# Patient Record
Sex: Male | Born: 1995 | Hispanic: No | Marital: Single | Smoking: Never smoker
Health system: Southern US, Community
[De-identification: ages and names within clinical notes are randomized; demographics above are authoritative.]

---

## 2018-03-07 ENCOUNTER — Emergency Department (HOSPITAL_COMMUNITY): Payer: BLUE CROSS/BLUE SHIELD

## 2018-03-07 ENCOUNTER — Inpatient Hospital Stay (HOSPITAL_COMMUNITY)
Admission: EM | Admit: 2018-03-07 | Discharge: 2018-03-19 | DRG: 840 | Disposition: A | Discharge status: Home / Self Care | Payer: BLUE CROSS/BLUE SHIELD | Attending: Hematology | Admitting: Hematology

## 2018-03-07 ENCOUNTER — Other Ambulatory Visit: Payer: Self-pay

## 2018-03-07 ENCOUNTER — Encounter (HOSPITAL_COMMUNITY): Payer: Self-pay | Admitting: Emergency Medicine

## 2018-03-07 DIAGNOSIS — I871 Compression of vein: Secondary | ICD-10-CM | POA: Diagnosis present

## 2018-03-07 DIAGNOSIS — I82611 Acute embolism and thrombosis of superficial veins of right upper extremity: Secondary | ICD-10-CM | POA: Diagnosis present

## 2018-03-07 DIAGNOSIS — J948 Other specified pleural conditions: Secondary | ICD-10-CM | POA: Diagnosis present

## 2018-03-07 DIAGNOSIS — R0600 Dyspnea, unspecified: Secondary | ICD-10-CM | POA: Diagnosis not present

## 2018-03-07 DIAGNOSIS — J189 Pneumonia, unspecified organism: Secondary | ICD-10-CM | POA: Diagnosis present

## 2018-03-07 DIAGNOSIS — J45909 Unspecified asthma, uncomplicated: Secondary | ICD-10-CM | POA: Diagnosis present

## 2018-03-07 DIAGNOSIS — I82622 Acute embolism and thrombosis of deep veins of left upper extremity: Secondary | ICD-10-CM | POA: Diagnosis not present

## 2018-03-07 DIAGNOSIS — D62 Acute posthemorrhagic anemia: Secondary | ICD-10-CM | POA: Diagnosis not present

## 2018-03-07 DIAGNOSIS — Z7189 Other specified counseling: Secondary | ICD-10-CM

## 2018-03-07 DIAGNOSIS — I2693 Single subsegmental pulmonary embolism without acute cor pulmonale: Secondary | ICD-10-CM | POA: Diagnosis not present

## 2018-03-07 DIAGNOSIS — I454 Nonspecific intraventricular block: Secondary | ICD-10-CM | POA: Diagnosis present

## 2018-03-07 DIAGNOSIS — K5900 Constipation, unspecified: Secondary | ICD-10-CM | POA: Diagnosis not present

## 2018-03-07 DIAGNOSIS — Z7951 Long term (current) use of inhaled steroids: Secondary | ICD-10-CM | POA: Diagnosis not present

## 2018-03-07 DIAGNOSIS — C8528 Mediastinal (thymic) large B-cell lymphoma, lymph nodes of multiple sites: Secondary | ICD-10-CM

## 2018-03-07 DIAGNOSIS — C8592 Non-Hodgkin lymphoma, unspecified, intrathoracic lymph nodes: Secondary | ICD-10-CM | POA: Diagnosis not present

## 2018-03-07 DIAGNOSIS — J910 Malignant pleural effusion: Secondary | ICD-10-CM

## 2018-03-07 DIAGNOSIS — J939 Pneumothorax, unspecified: Secondary | ICD-10-CM

## 2018-03-07 DIAGNOSIS — J90 Pleural effusion, not elsewhere classified: Secondary | ICD-10-CM | POA: Diagnosis not present

## 2018-03-07 DIAGNOSIS — I82C12 Acute embolism and thrombosis of left internal jugular vein: Secondary | ICD-10-CM | POA: Diagnosis present

## 2018-03-07 DIAGNOSIS — Z5111 Encounter for antineoplastic chemotherapy: Secondary | ICD-10-CM

## 2018-03-07 DIAGNOSIS — J984 Other disorders of lung: Secondary | ICD-10-CM | POA: Diagnosis not present

## 2018-03-07 DIAGNOSIS — R740 Nonspecific elevation of levels of transaminase and lactic acid dehydrogenase [LDH]: Secondary | ICD-10-CM | POA: Diagnosis present

## 2018-03-07 DIAGNOSIS — T380X5A Adverse effect of glucocorticoids and synthetic analogues, initial encounter: Secondary | ICD-10-CM | POA: Diagnosis present

## 2018-03-07 DIAGNOSIS — R739 Hyperglycemia, unspecified: Secondary | ICD-10-CM | POA: Diagnosis present

## 2018-03-07 DIAGNOSIS — J9859 Other diseases of mediastinum, not elsewhere classified: Secondary | ICD-10-CM | POA: Diagnosis not present

## 2018-03-07 DIAGNOSIS — I82B12 Acute embolism and thrombosis of left subclavian vein: Secondary | ICD-10-CM | POA: Diagnosis present

## 2018-03-07 DIAGNOSIS — R600 Localized edema: Secondary | ICD-10-CM | POA: Diagnosis not present

## 2018-03-07 DIAGNOSIS — J9811 Atelectasis: Secondary | ICD-10-CM | POA: Diagnosis present

## 2018-03-07 DIAGNOSIS — I361 Nonrheumatic tricuspid (valve) insufficiency: Secondary | ICD-10-CM | POA: Diagnosis not present

## 2018-03-07 DIAGNOSIS — I340 Nonrheumatic mitral (valve) insufficiency: Secondary | ICD-10-CM | POA: Diagnosis not present

## 2018-03-07 DIAGNOSIS — C8522 Mediastinal (thymic) large B-cell lymphoma, intrathoracic lymph nodes: Secondary | ICD-10-CM

## 2018-03-07 DIAGNOSIS — R0602 Shortness of breath: Secondary | ICD-10-CM | POA: Diagnosis present

## 2018-03-07 DIAGNOSIS — E440 Moderate protein-calorie malnutrition: Secondary | ICD-10-CM

## 2018-03-07 DIAGNOSIS — Z9889 Other specified postprocedural states: Secondary | ICD-10-CM

## 2018-03-07 DIAGNOSIS — Z681 Body mass index (BMI) 19 or less, adult: Secondary | ICD-10-CM | POA: Diagnosis not present

## 2018-03-07 DIAGNOSIS — C8590 Non-Hodgkin lymphoma, unspecified, unspecified site: Secondary | ICD-10-CM | POA: Diagnosis not present

## 2018-03-07 DIAGNOSIS — Z79899 Other long term (current) drug therapy: Secondary | ICD-10-CM | POA: Diagnosis not present

## 2018-03-07 DIAGNOSIS — R918 Other nonspecific abnormal finding of lung field: Secondary | ICD-10-CM | POA: Diagnosis not present

## 2018-03-07 LAB — MRSA PCR SCREENING: MRSA by PCR: NEGATIVE

## 2018-03-07 LAB — HEPATIC FUNCTION PANEL
ALBUMIN: 2.9 g/dL — AB (ref 3.5–5.0)
ALT: 24 U/L (ref 0–44)
AST: 32 U/L (ref 15–41)
Alkaline Phosphatase: 54 U/L (ref 38–126)
Bilirubin, Direct: 0.1 mg/dL (ref 0.0–0.2)
Indirect Bilirubin: 0.2 mg/dL — ABNORMAL LOW (ref 0.3–0.9)
TOTAL PROTEIN: 6.7 g/dL (ref 6.5–8.1)
Total Bilirubin: 0.3 mg/dL (ref 0.3–1.2)

## 2018-03-07 LAB — BASIC METABOLIC PANEL
Anion gap: 10 (ref 5–15)
BUN: 7 mg/dL (ref 6–20)
CO2: 27 mmol/L (ref 22–32)
Calcium: 8.7 mg/dL — ABNORMAL LOW (ref 8.9–10.3)
Chloride: 98 mmol/L (ref 98–111)
Creatinine, Ser: 0.96 mg/dL (ref 0.61–1.24)
GFR calc Af Amer: >60 mL/min (ref >60)
GFR calc non Af Amer: >60 mL/min (ref >60)
Glucose, Bld: 87 mg/dL (ref 70–99)
Potassium: 4.2 mmol/L (ref 3.5–5.1)
Sodium: 135 mmol/L (ref 135–145)

## 2018-03-07 LAB — CBC WITH DIFFERENTIAL/PLATELET
Abs Immature Granulocytes: 0.02 10*3/uL (ref 0.00–0.07)
Basophils Absolute: 0.1 10*3/uL (ref 0.0–0.1)
Basophils Relative: 1 %
Eosinophils Absolute: 0.3 10*3/uL (ref 0.0–0.5)
Eosinophils Relative: 5 %
HCT: 45.6 % (ref 39.0–52.0)
Hemoglobin: 14.1 g/dL (ref 13.0–17.0)
Immature Granulocytes: 0 %
Lymphocytes Relative: 14 %
Lymphs Abs: 1 10*3/uL (ref 0.7–4.0)
MCH: 25.4 pg — ABNORMAL LOW (ref 26.0–34.0)
MCHC: 30.9 g/dL (ref 30.0–36.0)
MCV: 82.2 fL (ref 80.0–100.0)
Monocytes Absolute: 0.6 10*3/uL (ref 0.1–1.0)
Monocytes Relative: 9 %
Neutro Abs: 5.3 10*3/uL (ref 1.7–7.7)
Neutrophils Relative %: 71 %
Platelets: 319 10*3/uL (ref 150–400)
RBC: 5.55 MIL/uL (ref 4.22–5.81)
RDW: 12.6 % (ref 11.5–15.5)
WBC: 7.3 10*3/uL (ref 4.0–10.5)
nRBC: 0 % (ref 0.0–0.2)

## 2018-03-07 LAB — ABO/RH: ABO/RH(D): A POS

## 2018-03-07 LAB — BRAIN NATRIURETIC PEPTIDE: B Natriuretic Peptide: 33.2 pg/mL (ref 0.0–100.0)

## 2018-03-07 LAB — URIC ACID: Uric Acid, Serum: 5.2 mg/dL (ref 3.7–8.6)

## 2018-03-07 LAB — LACTATE DEHYDROGENASE: LDH: 677 U/L — ABNORMAL HIGH (ref 98–192)

## 2018-03-07 LAB — D-DIMER, QUANTITATIVE: D-Dimer, Quant: 2.53 ug/mL-FEU — ABNORMAL HIGH (ref 0.00–0.50)

## 2018-03-07 LAB — TROPONIN I: Troponin I: <0.03 ng/mL (ref <0.03)

## 2018-03-07 LAB — GLUCOSE, CAPILLARY: GLUCOSE-CAPILLARY: 90 mg/dL (ref 70–99)

## 2018-03-07 MED ORDER — IOPAMIDOL (ISOVUE-370) INJECTION 76%
100.0000 mL | Freq: Once | INTRAVENOUS | Status: AC | PRN
  Administered 2018-03-07: 100 mL via INTRAVENOUS

## 2018-03-07 MED ORDER — HEPARIN (PORCINE) 25000 UT/250ML-% IV SOLN
1000.0000 [IU]/h | INTRAVENOUS | Status: DC
  Administered 2018-03-07: 1000 [IU]/h via INTRAVENOUS
  Filled 2018-03-07: qty 250

## 2018-03-07 MED ORDER — IOPAMIDOL (ISOVUE-370) INJECTION 76%
INTRAVENOUS | Status: AC
  Filled 2018-03-07: qty 100

## 2018-03-07 MED ORDER — HEPARIN BOLUS VIA INFUSION
4000.0000 [IU] | Freq: Once | INTRAVENOUS | Status: AC
  Administered 2018-03-07: 4000 [IU] via INTRAVENOUS
  Filled 2018-03-07: qty 4000

## 2018-03-07 NOTE — ED Provider Notes (Signed)
[facility] EMERGENCY DEPARTMENT Provider Note   CSN: [id number] Arrival date & time: 03/07/18  1438   History   Chief Complaint Chief Complaint  Patient presents with  . Shortness of Breath    HPI Nicholas Moreno is a 22 y.o. male.  HPI   22 year old male presents today with complaints of shortness of breath.  Patient notes approximately 3 months ago he had an upper respiratory infection which caused shortness of breath, rhinorrhea, nasal congestion and cough.  Patient notes that the symptoms improved but he continues to have cough and shortness of breath.  He notes this is worse with ambulation, worse with laying down.  He denies any associated chest pain or fever.  He denies any history DVT or lower extremity swelling or edema.  Patient reports that he does not smoke does not vape.  He reports recent travel in October to [place], and travel to the [place] in August.  Patient is currently a Ship broker at [employer] and has been seen 3 times by their health department with no acute findings although he reports that he did not do any imaging or testing.  He denies any significant past medical history including asthma.  He was recently placed on prednisone noting he took a dose today, he was also placed on albuterol but has not used it since 4 AM this morning.      History reviewed. No pertinent past medical history.  There are no active problems to display for this patient.         Home Medications    Prior to Admission medications   Not on File    Family History No family history on file.  Social History Social History   Tobacco Use  . Smoking status: Not on file  Substance Use Topics  . Alcohol use: Not on file  . Drug use: Not on file     Allergies   Patient has no known allergies.   Review of Systems Review of Systems  All other systems reviewed and are negative.  Physical Exam Updated Vital Signs BP 101/66   Pulse 100   Temp 99 F  (37.2 C) (Oral)   Resp 16   Ht 5\' 6"  (1.676 m)   Wt 54.4 kg   SpO2 99%   BMI 19.37 kg/m   Physical Exam  Constitutional: He is oriented to person, place, and time. He appears well-developed and well-nourished.  HENT:  Head: Normocephalic and atraumatic.  Oropharynx clear no swelling or edema, bilateral TMs normal-hoarse voice  Eyes: Pupils are equal, round, and reactive to light. Conjunctivae are normal. Right eye exhibits no discharge. Left eye exhibits no discharge. No scleral icterus.  Neck: Normal range of motion. No JVD present. No tracheal deviation present.  Cardiovascular: Regular rhythm, normal heart sounds and intact distal pulses. Exam reveals no gallop and no friction rub.  No murmur heard. Minor right-sided JVD  Pulmonary/Chest: Effort normal and breath sounds normal. No stridor. No respiratory distress. He has no wheezes. He has no rales. He exhibits no tenderness.  Musculoskeletal:  No lower extremity swelling or edema  Neurological: He is alert and oriented to person, place, and time. Coordination normal.  Psychiatric: He has a normal mood and affect. His behavior is normal. Judgment and thought content normal.  Nursing note and vitals reviewed.   ED Treatments / Results  Labs (all labs ordered are listed, but only abnormal results are displayed) Labs Reviewed  CBC WITH DIFFERENTIAL/PLATELET - Abnormal; Notable  for the following components:      Result Value   MCH 25.4 (*)    All other components within normal limits  BASIC METABOLIC PANEL - Abnormal; Notable for the following components:   Calcium 8.7 (*)    All other components within normal limits  D-DIMER, QUANTITATIVE (NOT AT [facility]) - Abnormal; Notable for the following components:   D-Dimer, Quant 2.53 (*)    All other components within normal limits  BRAIN NATRIURETIC PEPTIDE  TROPONIN I    EKG EKG Interpretation  Date/Time:  Friday March 07 2018 15:05:18 EST Ventricular Rate:  106 PR  Interval:  128 QRS Duration: 84 QT Interval:  322 QTC Calculation: 427 R Axis:   80 Text Interpretation:  Sinus tachycardia Cannot rule out Anterior infarct , age undetermined Abnormal ECG No old tracing to compare Confirmed by Merrily Pew [phone]) on 03/07/2018 4:12:37 PM   Radiology Ct Angio Chest Pe W And/or Wo Contrast  Result Date: 03/07/2018 CLINICAL DATA:  Shortness of breath and cough. EXAM: CT ANGIOGRAPHY CHEST WITH CONTRAST TECHNIQUE: Multidetector CT imaging of the chest was performed using the standard protocol during bolus administration of intravenous contrast. Multiplanar CT image reconstructions and MIPs were obtained to evaluate the vascular anatomy. CONTRAST:  141mL ISOVUE-370 IOPAMIDOL (ISOVUE-370) INJECTION 76% COMPARISON:  None. FINDINGS: Cardiovascular: Normal heart size. The main pulmonary artery appears patent. Filling defect within the left upper lobe lobar pulmonary artery, image 104 through image 101 compatible with acute pulmonary emboli. Mediastinum/Nodes: A very large anterior mediastinal mass is identified which extends into both upper lung zones. This mass encases and narrows the superior vena cava as well as the branch vessels off the aortic arch. There is extensive chest wall collaterals especially in the left supraclavicular region and paraspinal region. Encasement and narrowing of the left main pulmonary artery and bilateral upper lobe pulmonary arteries. Encasement and narrowing of the trachea is identified with rightward and posterior displacement. The left mainstem bronchus is narrowed, and bilateral upper lobe bronchi are occluded. Narrowing of the left lower lobe bronchi. Lungs/Pleura: There is a moderate left pleural effusion. Approximately 90% opacification of the left upper lobe is identified secondary to tumor and postobstructive consolidation. A small amount of aerated lung within the apex. Compressive type atelectasis is noted within the left lung base.  There is approximately 50% opacification of the right upper lobe with patchy areas of airspace consolidation and ground-glass attenuation within the remaining portions of the right upper lobe. The mass within the right upper lobe contains central areas of cavitation compatible with necrosis. Upper Abdomen: No acute findings. Musculoskeletal: No chest wall abnormality. No acute or significant osseous findings. Review of the MIP images confirms the above findings. IMPRESSION: 1. Small acute pulmonary emboli identified within the distal left upper lobe lobar pulmonary artery. 2. 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 Primary differential considerations include lymphoma and leukemia as well as malignant derm cell tumors. 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 Narrowing and displacement of the trachea with complete occlusion of the upper lobe bronchi. There is also marked narrowing of bilateral upper lobe pulmonary arteries. 3. Significantly diminished aeration to both upper lobes, left greater than right. 4. Moderate left pleural effusion. Critical Value/emergent results were called by telephone at the time of interpretation on 03/07/2018 at 5:29 pm to Dr. Vonna Kotyk  GEIPLE , who verbally acknowledged these results. Electronically Signed   By: Kerby Moors M.D.   On: 03/07/2018 17:30    Procedures Procedures (including critical care time)  Medications Ordered in ED Medications  iopamidol (ISOVUE-370) 76 % injection (has no administration in time range)  iopamidol (ISOVUE-370) 76 % injection (has no administration in time range)  iopamidol (ISOVUE-370) 76 % injection 100 mL (100 mLs Intravenous Contrast Given 03/07/18 1646)     Initial Impression / Assessment and Plan / ED Course  I have reviewed the  triage vital signs and the nursing notes.  Pertinent labs & imaging results that were available during my care of the patient were reviewed by me and considered in my medical decision making (see chart for details).      Labs: CBC, BMP, d-dimer  Imaging: DG chest 2 view  Consults:  Therapeutics:  Discharge Meds:   Assessment/Plan: 22 year old male presents today with complaints of shortness of breath.  Uncertain etiology at this time.  Although he does have a hoarse voice question infectious etiology.  Patient is persistently tachycardic here so the concern for PE is raised.  He will need evaluation including EKG labs including d-dimer, chest x-ray and ongoing management.  Patient care signed to oncoming provider pending further evaluation management.   Final Clinical Impressions(s) / ED Diagnoses   Final diagnoses:  SOB (shortness of breath)    ED Discharge Orders    None       Okey Regal, PA-C 03/07/18 1800    Mesner, Corene Cornea, MD 03/07/18 [phone]

## 2018-03-07 NOTE — ED Notes (Signed)
ED Provider at bedside. 

## 2018-03-07 NOTE — H&P (Signed)
NAME:  Nicholas Moreno, MRN:  [id number], DOB:  1995/11/07, LOS: 0 ADMISSION DATE:  03/07/2018, CONSULTATION DATE:  03/07/18 REFERRING MD:  Carlisle Cater, PA-C CHIEF COMPLAINT:  Shortness of breath   Brief History   Admitted for dyspnea in setting of large mediastinal mass and PE. Stable on room air and hemodynamically stable.  History of present illness   Nicholas Moreno is 22 y.o. M with no significant PMH who presented to the ED for worsening dyspnea. Patient reports his symptoms began approximately 2 months ago with increasing dyspnea at rest. Approximately one month ago he was seen by [facility] at [facility] who felt that he may have asthma based on his physical exam and prescribed Flovent and Albuterol which helped briefly, however, his symptoms began to worsen again approximately one week ago and became severe enough today for him to present to the ED. He initially had subjective fevers when his symptoms began 2 months ago, but none since. He denies any sputum production, LE edema, or hemoptysis. He states his symptoms improve with ambulation and when lying on his left side but worsen with lying flat. He has also had decreased appetite since his symptoms began and has lost 18 lbs unintentionally over the last 2 months.   On presentation to the ED today D-dimer was mildly elevated and thus CTA PE protocol was obtained which showed a large, necrotic mediastinal mass with adjacent atelectasis, left pleural effusion and small RUL PE.  He also reports recent travel to the [place] over the recent summer and to [place] in October, but denies any known sick contacts.   Past Medical History  None  Significant Hospital Events   None  Consults:  Thoracic Surgery Medical Oncology  Procedures:  None  Significant Diagnostic Tests:  CTA Chest: IMPRESSION: 1. Small acute pulmonary emboli identified within the distal left  upper lobe lobar pulmonary artery. 2. Very large partially cavitary  anterior mediastinal mass is identified which encases the great vessels and its branches. Primary differential considerations include lymphoma and leukemia as well as malignant derm cell tumors. 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 Narrowing and displacement of the trachea with complete occlusion of the upper lobe bronchi. There is also marked narrowing of bilateral upper lobe pulmonary arteries. 3. Significantly diminished aeration to both upper lobes, left greater than right. 4. Moderate left pleural effusion.  Micro Data:  n/a  Antimicrobials:  n/a   Interim history/subjective:  Patient reports mild dyspnea at rest but is stable on RA. Denies any chest pain or hemoptysis. Thoracic Surgery consulted by ED, but have not seen patient yet.  Objective   Blood pressure 101/66, pulse 100, temperature 99 F (37.2 C), temperature source Oral, resp. rate 16, height 5\' 6"  (1.676 m), weight 54.4 kg, SpO2 99 %.       No intake or output data in the 24 hours ending 03/07/18 1937 Filed Weights   03/07/18 1445  Weight: 54.4 kg    Examination: General: Mild tachypnea but no distress, appears stated age HENT: EOMI, sclera clear, MMM, nares patent Lungs: diminished on left. Improved aeration on Right. Mild tachypnea with RR in 20s. Cardiovascular: Tachycardic Abdomen: Soft, NTND, no masses apprecated. Small area of bruising on R Hip. Extremities: No swelling, warm to touch Neuro: A&Ox3, no focal deficits GU: deferred but no masses per ED exam  Resolved Hospital Problem list   n/a  Assessment & Plan:   Mediastinal mass: With adjacent atelectasis and left pleural effusion resulting in dyspnea at rest.  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  Germ cell tumor is also a consideration although less likely.  No current respiratory distress but mild dyspnea on  exam. --Medical oncology consulted, appreciate assistance.  Will check LDH, uric acid, beta hCG, and alpha-fetoprotein per their recommendations. --Thoracic surgery consulted by emergency department for consideration of biopsy.  Potentially could have CT-guided core needle biopsy as opposed to surgical biopsy which may limit his risk.  Will discuss with thoracic surgery consultants. --Consider thoracentesis in AM --N.p.o. at midnight for possible biopsy.   Pulmonary embolism: --Start heparin infusion --Hold at 4am for possible procedure  Best practice:  Diet: Regular diet, NPO at MN Pain/Anxiety/Delirium protocol (if indicated): n/a VAP protocol (if indicated): n/a DVT prophylaxis: Hep gtt GI prophylaxis: n/a Glucose control: n/a Mobility: as tolerated Code Status: Full Family Communication: discussed at bedside on admission with mother Disposition: ICU  Labs   CBC: Recent Labs  Lab 03/07/18 1535  WBC 7.3  NEUTROABS 5.3  HGB 14.1  HCT 45.6  MCV 82.2  PLT 250    Basic Metabolic Panel: Recent Labs  Lab 03/07/18 1535  NA 135  K 4.2  CL 98  CO2 27  GLUCOSE 87  BUN 7  CREATININE 0.96  CALCIUM 8.7*   GFR: Estimated Creatinine Clearance: 93.7 mL/min (by C-G formula based on SCr of 0.96 mg/dL). Recent Labs  Lab 03/07/18 1535  WBC 7.3    Liver Function Tests: No results for input(s): AST, ALT, ALKPHOS, BILITOT, PROT, ALBUMIN in the last 168 hours. No results for input(s): LIPASE, AMYLASE in the last 168 hours. No results for input(s): AMMONIA in the last 168 hours.  ABG No results found for: PHART, PCO2ART, PO2ART, HCO3, TCO2, ACIDBASEDEF, O2SAT   Coagulation Profile: No results for input(s): INR, PROTIME in the last 168 hours.  Cardiac Enzymes: Recent Labs  Lab 03/07/18 1540  TROPONINI <0.03    HbA1C: No results found for: HGBA1C  CBG: No results for input(s): GLUCAP in the last 168 hours.  Review of Systems:   Negative on 12 system review  except per HPI  Past Medical History  He,  has no past medical history on file.   Surgical History   History reviewed. No pertinent surgical history.   Social History   reports that he has never smoked. He has never used smokeless tobacco. He reports that he drank alcohol. He reports that he has current or past drug history.   Family History   His family history is not on file.   Allergies No Known Allergies   Home Medications  Prior to Admission medications   Medication Sig Start Date End Date Taking? Authorizing Provider  albuterol (PROVENTIL HFA;VENTOLIN HFA) 108 (90 Base) MCG/ACT inhaler Inhale 2 puffs into the lungs every 4 (four) hours as needed for wheezing or shortness of breath.   Yes [provider]  fluticasone (FLOVENT HFA) 110 MCG/ACT inhaler Inhale 2 puffs into the lungs 2 (two) times daily.   Yes [provider]  OVER THE COUNTER MEDICATION Take 1 capsule by mouth 2 (two) times daily. "Immpower" immune supplement   Yes [provider]  vitamin C (ASCORBIC ACID) 500 MG tablet Take 500 mg by mouth daily.   Yes [provider]         Marlise Eves, MD [facility] PCCM

## 2018-03-07 NOTE — Progress Notes (Signed)
[place] for heparin Indication: pulmonary embolus  Heparin Dosing Weight: 54.4 kg  Labs: Recent Labs    03/07/18 1535 03/07/18 1540  HGB 14.1  --   HCT 45.6  --   PLT 319  --   CREATININE 0.96  --   TROPONINI  --  <0.03    Assessment: 21 yom with new small PE. Pharmacy consulted to heparin. Not on anticoagulation PTA. CBC wnl. No bleed documented.  Goal of Therapy:  Heparin level 0.3-0.7 units/ml Monitor platelets by anticoagulation protocol: Yes   Plan:  Heparin 4000 unit bolus Start heparin at 1000 units/h 6h heparin level Daily heparin level/CBC Monitor s/sx bleeding  Elicia Lamp, PharmD, BCPS Clinical Pharmacist 03/07/2018 7:16 PM

## 2018-03-07 NOTE — ED Triage Notes (Signed)
Patient to ED c/o shortness of breath and weak cough x 2 months. States it started as an upper respiratory illness but he hasn't improved. Patient's mother reports patient has had fevers off and on. He adds that his voice is more hoarse than normal as well. Resp e/u.

## 2018-03-07 NOTE — ED Notes (Signed)
Lab called for add ons.

## 2018-03-07 NOTE — Consult Note (Signed)
[address]       [name] [address]             [phone]        Nicholas Moreno [facility] Medical Record #[id number] Date of Birth: [birth year]  Referring: No ref. provider found Primary Care: Nicholas Moreno, No Pcp Per Primary Cardiologist:No primary care provider on file.  Chief Complaint:    Chief Complaint  Nicholas Moreno presents with  . Shortness of Breath    History of Present Illness:      Nicholas Moreno examined, images of CT scan personally reviewed and discussed with Nicholas Moreno and parents 70 year old college senior presents with cough congestion shortness of breath.  For the last month the Nicholas Moreno has had poor appetite, night sweats, intermittent fever and weight loss.  CT scan of the chest shows a large infiltrative mediastinal mass extending across the thorax involving both lungs with consolidation of the left lung and involvement of the mediastinal vessels including compression of the SVC and pulmonary arteries and a pulmonary embolus in  a left upper lobe pulmonary artery branch.  Nicholas Moreno is currently in medical ICU on heparin drip normal saturations slightly tachycardic.  No evidence of pericardial effusion on CT scan  Current Activity/ Functional Status: Nicholas Moreno is attending college at [employer] Score: At the time of surgery this Nicholas Moreno's most appropriate activity status/level should be described as: []     0    Normal activity, no symptoms [x]     1    Restricted in physical strenuous activity but ambulatory, able to do out light work []     2    Ambulatory and capable of self care, unable to do work activities, up and about                 more than 50%  Of the time                            []     3    Only limited self care, in bed greater than 50% of waking hours []     4    Completely disabled, no self care, confined to bed or chair []     5    Moribund  History reviewed. No pertinent past medical history.  History reviewed. No pertinent surgical  history.  Social History   Tobacco Use  Smoking Status Never Smoker  Smokeless Tobacco Never Used    Social History   Substance and Sexual Activity  Alcohol Use Not Currently     No Known Allergies  Current Facility-Administered Medications  Medication Dose Route Frequency Provider Last Rate Last Dose  . heparin ADULT infusion 100 units/mL (25000 units/269mL sodium chloride 0.45%)  1,000 Units/hr Intravenous Continuous Romona Curls, RPH 10 mL/hr at 03/07/18 2005 1,000 Units/hr at 03/07/18 2005  . iopamidol (ISOVUE-370) 76 % injection           . iopamidol (ISOVUE-370) 76 % injection             Medications Prior to Admission  Medication Sig Dispense Refill Last Dose  . albuterol (PROVENTIL HFA;VENTOLIN HFA) 108 (90 Base) MCG/ACT inhaler Inhale 2 puffs into the lungs every 4 (four) hours as needed for wheezing or shortness of breath.   03/07/2018 at 400  . fluticasone (FLOVENT HFA) 110 MCG/ACT inhaler Inhale 2 puffs into the lungs 2 (two) times daily.   03/07/2018 at 1000  .  OVER THE COUNTER MEDICATION Take 1 capsule by mouth 2 (two) times daily. "Immpower" immune supplement   03/06/2018 at Unknown time  . vitamin C (ASCORBIC ACID) 500 MG tablet Take 500 mg by mouth daily.   week ago    History reviewed. No pertinent family history.   Review of Systems:   ROS      Cardiac Review of Systems: Y or  [    ]= no  Chest Pain [  y yes discomfort]  Resting SOB [   ] Exertional SOB  [ y ]  Orthopnea [  ]   Pedal Edema [   ]    Palpitations [  ] Syncope  [  ]   Presyncope [   ]  General Review of Systems: [Y] = yes [  ]=no Constitional: recent weight change [y weight loss]; anorexia Blue.Nicholas Moreno  ]; fatigue [ y ]; nausea [  ]; night sweats Blue.Nicholas Moreno  ]; fever [  ]; or chills [  ]                                                               Dental: Last Dentist visit: 1 year  Eye : blurred vision [  ]; diplopia [   ]; vision changes [  ];  Amaurosis fugax[  ]; Resp: cough [  ];  wheezing[  ];   hemoptysis[  ]; shortness of breath[  ]; paroxysmal nocturnal dyspnea[  ]; dyspnea on exertion[  ]; or orthopnea[  ];  GI:  gallstones[  ], vomiting[  ];  dysphagia[  ]; melena[  ];  hematochezia [  ]; heartburn[  ];   Hx of  Colonoscopy[  ]; GU: kidney stones [  ]; hematuria[  ];   dysuria [  ];  nocturia[  ];  history of     obstruction [  ]; urinary frequency [  ]             Skin: rash, swelling[  ];, hair loss[  ];  peripheral edema[  ];  or itching[  ]; Musculosketetal: myalgias[  ];  joint swelling[  ];  joint erythema[  ];  joint pain[  ];  back pain[  ];  Heme/Lymph: bruising[  ];  bleeding[  ];  anemia[  ];  Neuro: TIA[  ];  headaches[  ];  stroke[  ];  vertigo[  ];  seizures[  ];   paresthesias[  ];  difficulty walking[  ];  Psych:depression[  ]; anxiety[  ];  Endocrine: diabetes[  ];  thyroid dysfunction[  ];               Physical Exam: BP 126/71 (BP Location: Right Arm)   Pulse (!) 111   Temp (!) 100.7 F (38.2 C) (Oral)   Resp (!) 26   Ht 5\' 6"  (1.676 m)   Wt 52.9 kg   SpO2 98%   BMI 18.82 kg/m          Exam    General- alert and comfortable but anxious and somewhat pale    Neck- no JVD, no cervical adenopathy palpable, no carotid bruit   Lungs-tubular breath sounds on left side   Cor- regular rate and rhythm, sinus tachycardia, no murmur , gallop  Abdomen- soft, non-tender.  No organomegaly   Extremities - warm, non-tender, minimal edema   Neuro- oriented, appropriate, no focal weakness  Diagnostic Studies & Laboratory data:     Recent Radiology Findings:   Ct Angio Chest Pe W And/or Wo Contrast  Result Date: 03/07/2018 CLINICAL DATA:  Shortness of breath and cough. EXAM: CT ANGIOGRAPHY CHEST WITH CONTRAST TECHNIQUE: Multidetector CT imaging of the chest was performed using the standard protocol during bolus administration of intravenous contrast. Multiplanar CT image reconstructions and MIPs were obtained to evaluate the vascular anatomy. CONTRAST:  167mL  ISOVUE-370 IOPAMIDOL (ISOVUE-370) INJECTION 76% COMPARISON:  None. FINDINGS: Cardiovascular: Normal heart size. The main pulmonary artery appears patent. Filling defect within the left upper lobe lobar pulmonary artery, image 104 through image 101 compatible with acute pulmonary emboli. Mediastinum/Nodes: A very large anterior mediastinal mass is identified which extends into both upper lung zones. This mass encases and narrows the superior vena cava as well as the branch vessels off the aortic arch. There is extensive chest wall collaterals especially in the left supraclavicular region and paraspinal region. Encasement and narrowing of the left main pulmonary artery and bilateral upper lobe pulmonary arteries. Encasement and narrowing of the trachea is identified with rightward and posterior displacement. The left mainstem bronchus is narrowed, and bilateral upper lobe bronchi are occluded. Narrowing of the left lower lobe bronchi. Lungs/Pleura: There is a moderate left pleural effusion. Approximately 90% opacification of the left upper lobe is identified secondary to tumor and postobstructive consolidation. A small amount of aerated lung within the apex. Compressive type atelectasis is noted within the left lung base. There is approximately 50% opacification of the right upper lobe with patchy areas of airspace consolidation and ground-glass attenuation within the remaining portions of the right upper lobe. The mass within the right upper lobe contains central areas of cavitation compatible with necrosis. Upper Abdomen: No acute findings. Musculoskeletal: No chest wall abnormality. No acute or significant osseous findings. Review of the MIP images confirms the above findings. IMPRESSION: 1. Small acute pulmonary emboli identified within the distal left upper lobe lobar pulmonary artery. 2. 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  Primary differential considerations include lymphoma and leukemia as well as malignant derm cell tumors. 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 Narrowing and displacement of the trachea with complete occlusion of the upper lobe bronchi. There is also marked narrowing of bilateral upper lobe pulmonary arteries. 3. Significantly diminished aeration to both upper lobes, left greater than right. 4. Moderate left pleural effusion. Critical Value/emergent results were called by telephone at the time of interpretation on 03/07/2018 at 5:29 pm to Dr. Carlisle Cater , who verbally acknowledged these results. Electronically Signed   By: Kerby Moors M.D.   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 Tissue to establish diagnosis and direct oncologic therapy is needed. I would recommend first an IR CT directed core biopsy of the left lung. If material is necrotic and not adequate for lymphoma evaluation-flow cytometry then bronchoscopy and mediastinoscopy could be performed early next week.  Will follow.       @ME1 @ 03/07/2018 8:56 PM

## 2018-03-07 NOTE — ED Provider Notes (Signed)
5:26 PM handoff from [place] PA-C at shift change. Labs pending.   D-dimer was elevated. Pt tachycardic with voice change. CT imaging ordered.    5:41 PM discussed CT findings with radiology.  Patient and mother updated.  We will discuss case with cardiothoracic surgery for further recommendations.  I performed a GU exam on the patient.  I do not feel any obvious masses of the testes.  BP 101/66   Pulse 100   Temp 99 F (37.2 C) (Oral)   Resp 16   Ht 5\' 6"  (1.676 m)   Wt 54.4 kg   SpO2 99%   BMI 19.37 kg/m   6:08 PM Will defer anticoagulation decision to admitting team for PE as he will require upcoming biopsy.  Patient has no distress and the embolism is small.  I have spoken with Dr. Darcey Nora.  Cardiothoracic surgery to see patient.  They recommend admission to PCCM.  Spoke with PCCM, Dr, Ardeen Garland, who will see patient in the emergency department.  CRITICAL CARE Performed by: Carlisle Cater PA-C Total critical care time: 45 minutes Critical care time was exclusive of separately billable procedures and treating other patients. Critical care was necessary to treat or prevent imminent or life-threatening deterioration. Critical care was time spent personally by me on the following activities: development of treatment plan with patient and/or surrogate as well as nursing, discussions with consultants, evaluation of patient's response to treatment, examination of patient, obtaining history from patient or surrogate, ordering and performing treatments and interventions, ordering and review of laboratory studies, ordering and review of radiographic studies, pulse oximetry and re-evaluation of patient's condition.    Nicholas Moreno, [place], PA-C 03/07/18 1850    Mesner, Corene Cornea, MD 03/08/18 [phone]

## 2018-03-07 NOTE — ED Notes (Signed)
Patient transported to CT 

## 2018-03-08 ENCOUNTER — Inpatient Hospital Stay (HOSPITAL_COMMUNITY): Payer: BLUE CROSS/BLUE SHIELD

## 2018-03-08 DIAGNOSIS — J9859 Other diseases of mediastinum, not elsewhere classified: Secondary | ICD-10-CM

## 2018-03-08 DIAGNOSIS — C8592 Non-Hodgkin lymphoma, unspecified, intrathoracic lymph nodes: Secondary | ICD-10-CM

## 2018-03-08 DIAGNOSIS — Z9889 Other specified postprocedural states: Secondary | ICD-10-CM

## 2018-03-08 DIAGNOSIS — R918 Other nonspecific abnormal finding of lung field: Secondary | ICD-10-CM

## 2018-03-08 DIAGNOSIS — R0600 Dyspnea, unspecified: Secondary | ICD-10-CM

## 2018-03-08 DIAGNOSIS — I871 Compression of vein: Secondary | ICD-10-CM

## 2018-03-08 DIAGNOSIS — I2693 Single subsegmental pulmonary embolism without acute cor pulmonale: Secondary | ICD-10-CM

## 2018-03-08 LAB — CBC WITH DIFFERENTIAL/PLATELET
Abs Immature Granulocytes: 0.04 10*3/uL (ref 0.00–0.07)
Basophils Absolute: 0.1 10*3/uL (ref 0.0–0.1)
Basophils Relative: 1 %
EOS PCT: 4 %
Eosinophils Absolute: 0.4 10*3/uL (ref 0.0–0.5)
HCT: 38 % — ABNORMAL LOW (ref 39.0–52.0)
Hemoglobin: 12.1 g/dL — ABNORMAL LOW (ref 13.0–17.0)
Immature Granulocytes: 1 %
Lymphocytes Relative: 10 %
Lymphs Abs: 0.8 10*3/uL (ref 0.7–4.0)
MCH: 25.5 pg — ABNORMAL LOW (ref 26.0–34.0)
MCHC: 31.8 g/dL (ref 30.0–36.0)
MCV: 80.2 fL (ref 80.0–100.0)
Monocytes Absolute: 0.8 10*3/uL (ref 0.1–1.0)
Monocytes Relative: 10 %
Neutro Abs: 6.1 10*3/uL (ref 1.7–7.7)
Neutrophils Relative %: 74 %
Platelets: 457 10*3/uL — ABNORMAL HIGH (ref 150–400)
RBC: 4.74 MIL/uL (ref 4.22–5.81)
RDW: 12.6 % (ref 11.5–15.5)
WBC: 8.2 10*3/uL (ref 4.0–10.5)
nRBC: 0 % (ref 0.0–0.2)

## 2018-03-08 LAB — BODY FLUID CELL COUNT WITH DIFFERENTIAL
Eos, Fluid: 0 %
Monocyte-Macrophage-Serous Fluid: 19 % — ABNORMAL LOW (ref 50–90)
Neutrophil Count, Fluid: 12 % (ref 0–25)
Other Cells, Fluid: 69 %
WBC FLUID: 410 uL (ref 0–1000)

## 2018-03-08 LAB — HIV ANTIBODY (ROUTINE TESTING W REFLEX): HIV Screen 4th Generation wRfx: NONREACTIVE

## 2018-03-08 LAB — PROTEIN, PLEURAL OR PERITONEAL FLUID: Total protein, fluid: 3.1 g/dL

## 2018-03-08 LAB — LACTATE DEHYDROGENASE, PLEURAL OR PERITONEAL FLUID: LD, Fluid: 703 U/L — ABNORMAL HIGH (ref 3–23)

## 2018-03-08 LAB — PROTEIN, TOTAL: Total Protein: 6.7 g/dL (ref 6.5–8.1)

## 2018-03-08 LAB — LACTATE DEHYDROGENASE: LDH: 603 U/L — ABNORMAL HIGH (ref 98–192)

## 2018-03-08 LAB — CHOLESTEROL, TOTAL: Cholesterol: 123 mg/dL (ref 0–200)

## 2018-03-08 LAB — HEPARIN LEVEL (UNFRACTIONATED): Heparin Unfractionated: <0.1 IU/mL — ABNORMAL LOW (ref 0.30–0.70)

## 2018-03-08 MED ORDER — DEXAMETHASONE SODIUM PHOSPHATE 4 MG/ML IJ SOLN
20.0000 mg | Freq: Every day | INTRAMUSCULAR | Status: DC
  Administered 2018-03-08: 20 mg via INTRAVENOUS
  Filled 2018-03-08 (×3): qty 5

## 2018-03-08 MED ORDER — PANTOPRAZOLE SODIUM 40 MG IV SOLR
40.0000 mg | INTRAVENOUS | Status: DC
  Administered 2018-03-08: 40 mg via INTRAVENOUS
  Filled 2018-03-08: qty 40

## 2018-03-08 MED ORDER — HEPARIN (PORCINE) 25000 UT/250ML-% IV SOLN
1500.0000 [IU]/h | INTRAVENOUS | Status: DC
  Administered 2018-03-09: 1500 [IU]/h via INTRAVENOUS
  Administered 2018-03-09: 1300 [IU]/h via INTRAVENOUS
  Filled 2018-03-08 (×2): qty 250

## 2018-03-08 MED ORDER — ALLOPURINOL 100 MG PO TABS
100.0000 mg | ORAL_TABLET | Freq: Two times a day (BID) | ORAL | Status: DC
  Administered 2018-03-08 – 2018-03-19 (×22): 100 mg via ORAL
  Filled 2018-03-08 (×23): qty 1

## 2018-03-08 NOTE — Progress Notes (Signed)
   NAME:  Nicholas Moreno, MRN:  [id number], DOB:  [birth year], LOS: 1 ADMISSION DATE:  03/07/2018, CONSULTATION DATE:  03/07/18 REFERRING MD:  Carlisle Cater, PA-C CHIEF COMPLAINT:  Shortness of breath   Brief History   Admitted for dyspnea in setting of large mediastinal mass and PE. Stable on room air and hemodynamically stable.  Significant Hospital Events   None  Consults:  Thoracic Surgery Medical Oncology  Procedures:  None  Significant Diagnostic Tests:  CTA Chest: IMPRESSION: 1. Small acute pulmonary emboli identified within the distal left  upper lobe lobar pulmonary artery. 2. 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 Primary differential considerations include lymphoma and leukemia as well as malignant derm cell tumors. 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 Narrowing and displacement of the trachea with complete occlusion of the upper lobe bronchi. There is also marked narrowing of bilateral upper lobe pulmonary arteries. 3. Significantly diminished aeration to both upper lobes, left greater than right. 4. Moderate left pleural effusion.  Micro Data:  n/a  Antimicrobials:  n/a   Interim history/subjective:  No pain or distress   Objective   Blood pressure (Abnormal) 108/59, pulse 83, temperature 98.8 F (37.1 C), temperature source Oral, resp. rate (Abnormal) 21, height 5\' 6"  (1.676 m), weight 52.9 kg, SpO2 93 %.        Intake/Output Summary (Last 24 hours) at 03/08/2018 1241 Last data filed at 03/08/2018 0600 Gross per 24 hour  Intake 117.86 ml  Output no documentation  Net 117.86 ml   Filed Weights   03/07/18 1445 03/07/18 2000 03/08/18 0456  Weight: 54.4 kg 52.9 kg 52.9 kg    Exam  General well developed 22 year old male HENT NCAT no JVD  no JVD Pulm decreased on left, no accessory use Card RRR no MRG Ext no edema brisk CR  abd soft not tender + bowel sounds Neuro intact   Resolved Hospital Problem list   n/a  Assessment & Plan:   Mediastinal mass: With adjacent atelectasis and left pleural effusion resulting in dyspnea at rest.  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  Germ cell tumor is also a consideration although less likely.  No current respiratory distress but mild dyspnea on exam. --Medical oncology consulted, appreciate assistance.  --Thoracic surgery consulted Plan CT guided core bx  IV steroids per Onc Will proceed w/ thoracentesis later today   Pulmonary embolism: Plan IV heparin after thora   Best practice:  Diet: Regular diet, NPO  Pain/Anxiety/Delirium protocol (if indicated): n/a VAP protocol (if indicated): n/a DVT prophylaxis: Hep gtt GI prophylaxis: n/a Glucose control: n/a Mobility: as tolerated Code Status: Full Family Communication: discussed at bedside on admission with mother Disposition: ICU  Erick Colace ACNP-BC [place] Pager # [phone] OR # [phone] if no answer

## 2018-03-08 NOTE — Consult Note (Signed)
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 Patient notes that he started feeling unwell in August when he noted gradually increasing fatigue and weight loss.  Patient notes subsequently in September and October he developed new cough with progressive shortness of breath which triggered multiple visits with his primary care physician and was treated symptomatically and with inhalers.  Patient reports no imaging studies at the time.  Patient reports he has felt poorly during the last 2 to 3 months and during his recent visits to the [place] in [place]. He reports a total unintentional weight loss of close to 20 pounds. He also reports drenching night sweats over the last 4 to 6 weeks.  He reports that his cough and shortness of breath progressively got much more severe which led to him presenting to the emergency room yesterday. Notes nonproductive cough. No overt fevers.  Patient had a CTA of the chest on 03/07/2018 which showed  "Small acute pulmonary emboli identified within the distal left upper lobe lobar pulmonary artery. 2. 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 Primary differential considerations include lymphoma and leukemia as well as malignant derm cell tumors. Marked  narrowing of the superior vena cava is noted and there is associated collateral vessel formation within the left supraclavicular region and paraspinal region. Narrowing and displacement of the trachea with complete occlusion of the upper lobe bronchi. There is also marked narrowing of bilateral upper lobe pulmonary arteries. 3. Significantly diminished aeration to both upper lobes, left greater than right. 4. Moderate left pleural effusion."  Patient is currently on room air and saturating 99% with stable hemodynamics. He has been started on IV heparin due to his small pulmonary embolism. 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  No headaches.  No facial swelling.  No upper extremity swelling.  No overt clinical symptomatology of SVC syndrome despite radiographic findings of some SVC narrowing.  No overt chest pain at this time.   MEDICAL HISTORY:  History reviewed. No pertinent past medical history.  SURGICAL HISTORY: History reviewed. No pertinent surgical history.  SOCIAL HISTORY: Social History   Socioeconomic History  . Marital status: Single    Spouse name: Not on file  . Number of children: Not on file  . Years of education: Not on file  . Highest education level: Not on file  Occupational History  . Not on file  Social Needs  . Financial resource strain: Not on file  . Food insecurity:    Worry: Not on file    Inability: Not on file  . Transportation needs:    Medical: Not on file    Non-medical: Not on file  Tobacco Use  . Smoking status: Never Smoker  .  Smokeless tobacco: Never Used  Substance and Sexual Activity  . Alcohol use: Not Currently  . Drug use: Not Currently  . Sexual activity: Not on file  Lifestyle  . Physical activity:    Days per week: Not on file    Minutes per session: Not on file  . Stress: Not on file    Relationships  . Social connections:    Talks on phone: Not on file    Gets together: Not on file    Attends religious service: Not on file    Active member of club or organization: Not on file    Attends meetings of clubs or organizations: Not on file    Relationship status: Not on file  . Intimate partner violence:    Fear of current or ex partner: Not on file    Emotionally abused: Not on file    Physically abused: Not on file    Forced sexual activity: Not on file  Other Topics Concern  . Not on file  Social History Narrative  . Not on file    FAMILY HISTORY: History reviewed. No pertinent family history.  ALLERGIES:  has No Known Allergies.  MEDICATIONS:  No current facility-administered medications for this encounter.     REVIEW OF SYSTEMS:    10 Point review of Systems was done is negative except as noted above.  PHYSICAL EXAMINATION: ECOG PERFORMANCE STATUS: 2 - Symptomatic, <50% confined to bed  . Vitals:   03/08/18 1122 03/08/18 1200  BP:  (!) 108/59  Pulse:  83  Resp:  (!) 21  Temp: 98.8 F (37.1 C)   SpO2:  93%   Filed Weights   03/07/18 1445 03/07/18 2000 03/08/18 0456  Weight: 120 lb (54.4 kg) 116 lb 10 oz (52.9 kg) 116 lb 10 oz (52.9 kg)   .Body mass index is 18.82 kg/m.  GENERAL:alert, in mild respiratory distress with increased shortness of breath if laying flat improved on sitting up. SKIN: no acute rashes, no significant lesions EYES: conjunctiva are pink and non-injected, sclera anicteric OROPHARYNX: MMM, no exudates, no oropharyngeal erythema or ulceration NECK: supple, no JVD LYMPH:  no palpable lymphadenopathy in the cervical, axillary or inguinal regions LUNGS: Decreased air entry throughout left lung zones, scattered rhonchi on the right. HEART: regular rate & rhythm ABDOMEN:  normoactive bowel sounds , non tender, not distended.                      No testicular swelling or tenderness to palpation. Extremity: no pedal  edema PSYCH: alert & oriented x 3 with fluent speech NEURO: no focal motor/sensory deficits  LABORATORY DATA:  I have reviewed the data as listed  . CBC Latest Ref Rng & Units 03/08/2018 03/07/2018  WBC 4.0 - 10.5 K/uL 8.2 7.3  Hemoglobin 13.0 - 17.0 g/dL 12.1(L) 14.1  Hematocrit 39.0 - 52.0 % 38.0(L) 45.6  Platelets 150 - 400 K/uL 457(H) 319   . CBC    Component Value Date/Time   WBC 8.2 03/08/2018 0240   RBC 4.74 03/08/2018 0240   HGB 12.1 (L) 03/08/2018 0240   HCT 38.0 (L) 03/08/2018 0240   PLT 457 (H) 03/08/2018 0240   MCV 80.2 03/08/2018 0240   MCH 25.5 (L) 03/08/2018 0240   MCHC 31.8 03/08/2018 0240   RDW 12.6 03/08/2018 0240   LYMPHSABS 0.8 03/08/2018 0240   MONOABS 0.8 03/08/2018 0240   EOSABS 0.4 03/08/2018 0240   BASOSABS 0.1 03/08/2018 0240   .  CMP Latest Ref Rng & Units 03/07/2018  Glucose 70 - 99 mg/dL 87  BUN 6 - 20 mg/dL 7  Creatinine 0.61 - 1.24 mg/dL 0.96  Sodium 135 - 145 mmol/L 135  Potassium 3.5 - 5.1 mmol/L 4.2  Chloride 98 - 111 mmol/L 98  CO2 22 - 32 mmol/L 27  Calcium 8.9 - 10.3 mg/dL 8.7(L)  Total Protein 6.5 - 8.1 g/dL 6.7  Total Bilirubin 0.3 - 1.2 mg/dL 0.3  Alkaline Phos 38 - 126 U/L 54  AST 15 - 41 U/L 32  ALT 0 - 44 U/L 24   Component     Latest Ref Rng & Units 03/07/2018  Total Protein     6.5 - 8.1 g/dL 6.7  Albumin     3.5 - 5.0 g/dL 2.9 (L)  AST     15 - 41 U/L 32  ALT     0 - 44 U/L 24  Alkaline Phosphatase     38 - 126 U/L 54  Total Bilirubin     0.3 - 1.2 mg/dL 0.3  Bilirubin, Direct     0.0 - 0.2 mg/dL 0.1  Indirect Bilirubin     0.3 - 0.9 mg/dL 0.2 (L)  D-Dimer, Quant     0.00 - 0.50 ug/mL-FEU 2.53 (H)  B Natriuretic Peptide     0.0 - 100.0 pg/mL 33.2  HIV Screen 4th Generation wRfx     Non Reactive Non Reactive  LDH     98 - 192 U/L 677 (H)  Uric Acid, Serum     3.7 - 8.6 mg/dL 5.2    RADIOGRAPHIC STUDIES: I have personally reviewed the radiological images as listed and agreed with the findings in  the report. Ct Angio Chest Pe W And/or Wo Contrast  Result Date: 03/07/2018 CLINICAL DATA:  Shortness of breath and cough. EXAM: CT ANGIOGRAPHY CHEST WITH CONTRAST TECHNIQUE: Multidetector CT imaging of the chest was performed using the standard protocol during bolus administration of intravenous contrast. Multiplanar CT image reconstructions and MIPs were obtained to evaluate the vascular anatomy. CONTRAST:  153m ISOVUE-370 IOPAMIDOL (ISOVUE-370) INJECTION 76% COMPARISON:  None. FINDINGS: Cardiovascular: Normal heart size. The main pulmonary artery appears patent. Filling defect within the left upper lobe lobar pulmonary artery, image 104 through image 101 compatible with acute pulmonary emboli. Mediastinum/Nodes: A very large anterior mediastinal mass is identified which extends into both upper lung zones. This mass encases and narrows the superior vena cava as well as the branch vessels off the aortic arch. There is extensive chest wall collaterals especially in the left supraclavicular region and paraspinal region. Encasement and narrowing of the left main pulmonary artery and bilateral upper lobe pulmonary arteries. Encasement and narrowing of the trachea is identified with rightward and posterior displacement. The left mainstem bronchus is narrowed, and bilateral upper lobe bronchi are occluded. Narrowing of the left lower lobe bronchi. Lungs/Pleura: There is a moderate left pleural effusion. Approximately 90% opacification of the left upper lobe is identified secondary to tumor and postobstructive consolidation. A small amount of aerated lung within the apex. Compressive type atelectasis is noted within the left lung base. There is approximately 50% opacification of the right upper lobe with patchy areas of airspace consolidation and ground-glass attenuation within the remaining portions of the right upper lobe. The mass within the right upper lobe contains central areas of cavitation compatible with  necrosis. Upper Abdomen: No acute findings. Musculoskeletal: No chest wall abnormality. No acute or significant osseous findings. Review of  the MIP images confirms the above findings. IMPRESSION: 1. Small acute pulmonary emboli identified within the distal left upper lobe lobar pulmonary artery. 2. 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 Primary differential considerations include lymphoma and leukemia as well as malignant derm cell tumors. 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 Narrowing and displacement of the trachea with complete occlusion of the upper lobe bronchi. There is also marked narrowing of bilateral upper lobe pulmonary arteries. 3. Significantly diminished aeration to both upper lobes, left greater than right. 4. Moderate left pleural effusion. Critical Value/emergent results were called by telephone at the time of interpretation on 03/07/2018 at 5:29 pm to Dr. Carlisle Cater , who verbally acknowledged these results. Electronically Signed   By: Kerby Moors M.D.   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 Other possibilities would would be extragonadal germ cell tumor, bulky Hodgkin's lymphoma etc. Elevated LDH level consistent with a high-grade lymphoma-like process CBC not markedly abnormal at this time.  No peripheral blood leukocytosis/lymphocytosis at this time. #2 SVC compression due to mediastinal mass without overt SVC syndrome clinical symptoms #3 small acute distal left upper lobe pulmonary embolus -on IV heparin #4 left-sided pleural effusion and left-sided lung atelectasis due to airway compression from mediastinal mass #5  significant weight loss of 20 pounds likely due to malignancy #6 drenching night sweats likely has constitutional symptoms from his mediastinal malignancy. Plan -I discussed in details with the patient and his mother at bedside all the lab, imaging findings and clinical concerns and answered their multiple questions in details. -Discussed with MICU team regarding concern for high-grade lymphoma and need for urgent biopsy for lymphoma work-up. -Patient is already been evaluated by cardiothoracic surgery Dr. Darcey Nora they are recommending IR consultation for core needle biopsy for urgent lymphoma work-up. -Patient will need multiple cores of fixed as well as fresh sample for extensive lymphoma work-up from nonnecrotic part of the tumor.  Recommend having low threshold for a surgical biopsy given urgent/emergent nature of his disease presentation and the need for early definitive diagnosis. -On IV heparin for his pulmonary embolism we will continue IV heparin at this time to allow for biopsy procedures and adjusting anticoagulation in the setting of possible rapidly changing clinical status. -Left pleural effusion-ultrasound-guided thoracentesis to be considered for diagnostic and therapeutic purposes.  Will defer to critical care team. -We will start the patient on high-dose dexamethasone empirically given compressive symptomatology from likely high-grade lymphoma. -We will start the patient on allopurinol for tumor lysis syndrome prophylaxis. -Tumor markers for germ cell tumor including LDH, AFP and beta hCG. -Continue to monitor in ICU due to high risk for hemodynamic and cardiorespiratory decompensation. -We will continue to follow the patient and will await tissue diagnosis for more definitive oncologic plan. -Given normal peripheral blood counts will hold off on bone marrow biopsy at this time.   All of the patients questions were answered with apparent satisfaction. The patient knows to call the  clinic with any problems, questions or concerns.  I spent 60 minutes counseling the patient face to face. The total time spent in the appointment was 80 minutes and more than 50% was on counseling and direct patient cares.  Sullivan Lone MD [place] AAHIVMS [facility] Hematology/Oncology Physician [facility]  (Office):       [phone] (Work cell):  [phone] (Fax):           [phone]  03/08/2018 12:38 PM

## 2018-03-08 NOTE — Progress Notes (Signed)
[place] for heparin Indication: pulmonary embolus  Heparin Dosing Weight: 54.4 kg  Labs: Recent Labs    03/07/18 1535 03/07/18 1540 03/08/18 0240  HGB 14.1  --  12.1*  HCT 45.6  --  38.0*  PLT 319  --  457*  HEPARINUNFRC  --   --  <0.10*  CREATININE 0.96  --   --   TROPONINI  --  <0.03  --     Assessment: 21 yom started on IV heparin for new PE. It was off since 4am for thoracentesis and now will restart at 4pm. Heparin level was drawn this AM and was undetectable. Unclear if this was before or after the heparin was turned off.   Goal of Therapy:  Heparin level 0.3-0.7 units/ml Monitor platelets by anticoagulation protocol: Yes   Plan:  Restart heparin gtt at 1100 units/hr at 4pm - cautious increase from this AM Check an 8 hr heparin level Daily heparin level and CBC  Salome Arnt, PharmD, BCPS Please see AMION for all pharmacy numbers 03/08/2018 3:37 PM

## 2018-03-08 NOTE — Progress Notes (Signed)
Transferred -in from MICU by wheelchair awake and alert accompanied by parents.

## 2018-03-08 NOTE — Progress Notes (Signed)
  Subjective: Patient tolerated left thoracentesis and removal 1000 cc of fluid Patient needs tissue for lymphoma markers- currently on heparin for left pulmonary embolus.  If IR CT-guided biopsy not possible/successful then bronchoscopy-mediastinoscopy under general anesthesia could be done early next week.  Will follow Objective: Vital signs in last 24 hours: Temp:  [98.3 F (36.8 C)-100.8 F (38.2 C)] 98.3 F (36.8 C) (11/30 1520) Pulse Rate:  [81-113] 112 (11/30 1600) Cardiac Rhythm: Normal sinus rhythm (11/30 0800) Resp:  [20-35] 35 (11/30 1600) BP: (91-126)/(57-77) 103/68 (11/30 1600) SpO2:  [73 %-98 %] 97 % (11/30 1600) Weight:  [52.9 kg] 52.9 kg (11/30 0456)  Hemodynamic parameters for last 24 hours:    Intake/Output from previous day: 11/29 0701 - 11/30 0700 In: 117.9 [I.V.:117.9] Out: -  Intake/Output this shift: Total I/O In: -  Out: 1000 [Other:1000]    Lab Results: Recent Labs    03/07/18 1535 03/08/18 0240  WBC 7.3 8.2  HGB 14.1 12.1*  HCT 45.6 38.0*  PLT 319 457*   BMET:  Recent Labs    03/07/18 1535  NA 135  K 4.2  CL 98  CO2 27  GLUCOSE 87  BUN 7  CREATININE 0.96  CALCIUM 8.7*    PT/INR: No results for input(s): LABPROT, INR in the last 72 hours. ABG No results found for: PHART, HCO3, TCO2, ACIDBASEDEF, O2SAT CBG (last 3)  Recent Labs    03/07/18 2001  GLUCAP 90    Assessment/Plan: S/P  Continue careful monitoring in hospital Will be available to perform bronchoscopy-mediastinoscopy under general anesthesia Monday or Tuesday if IR CT-guided core biopsy not performed by that time.  ot LOS: 1 day    Tharon Aquas Trigt III 03/08/2018

## 2018-03-08 NOTE — Procedures (Signed)
Thoracentesis Procedure Note  Pre-operative Diagnosis: left pleural effusion  Post-operative Diagnosis: same  Indications: evaluation and fluid and treatment of dyspnea   Procedure Details  Consent: Informed consent was obtained. Risks of the procedure were discussed including: infection, bleeding, pain, pneumothorax.  Under sterile conditions the patient was positioned. Betadine solution and sterile drapes were utilized.  1% buffered lidocaine was used to anesthetize the  rib space. Fluid was obtained without any difficulties and minimal blood loss.  A dressing was applied to the wound and wound care instructions were provided.   Findings 1000 ml of cloudy pleural fluid was obtained. A sample was sent to Pathology for cytogenetics, flow, and cell counts, as well as for infection analysis.  Complications:  None; patient tolerated the procedure well.          Condition: stable  Plan A follow up chest x-ray was ordered.  Erick Colace ACNP-BC [place] Pager # [phone] OR # [phone] if no answer

## 2018-03-09 ENCOUNTER — Inpatient Hospital Stay (HOSPITAL_COMMUNITY): Payer: BLUE CROSS/BLUE SHIELD

## 2018-03-09 DIAGNOSIS — C8590 Non-Hodgkin lymphoma, unspecified, unspecified site: Secondary | ICD-10-CM

## 2018-03-09 LAB — COMPREHENSIVE METABOLIC PANEL
ALT: 24 U/L (ref 0–44)
AST: 28 U/L (ref 15–41)
Albumin: 2.4 g/dL — ABNORMAL LOW (ref 3.5–5.0)
Alkaline Phosphatase: 54 U/L (ref 38–126)
Anion gap: 13 (ref 5–15)
BUN: 8 mg/dL (ref 6–20)
CHLORIDE: 98 mmol/L (ref 98–111)
CO2: 22 mmol/L (ref 22–32)
Calcium: 8.7 mg/dL — ABNORMAL LOW (ref 8.9–10.3)
Creatinine, Ser: 0.77 mg/dL (ref 0.61–1.24)
GFR calc Af Amer: >60 mL/min (ref >60)
GFR calc non Af Amer: >60 mL/min (ref >60)
Glucose, Bld: 213 mg/dL — ABNORMAL HIGH (ref 70–99)
Potassium: 3.9 mmol/L (ref 3.5–5.1)
Sodium: 133 mmol/L — ABNORMAL LOW (ref 135–145)
Total Bilirubin: 0.4 mg/dL (ref 0.3–1.2)
Total Protein: 6 g/dL — ABNORMAL LOW (ref 6.5–8.1)

## 2018-03-09 LAB — URINALYSIS, ROUTINE W REFLEX MICROSCOPIC
Bilirubin Urine: NEGATIVE
Glucose, UA: NEGATIVE mg/dL
Hgb urine dipstick: NEGATIVE
Ketones, ur: NEGATIVE mg/dL
Leukocytes, UA: NEGATIVE
Nitrite: NEGATIVE
Protein, ur: NEGATIVE mg/dL
Specific Gravity, Urine: 1.008 (ref 1.005–1.030)
pH: 7 (ref 5.0–8.0)

## 2018-03-09 LAB — CBC WITH DIFFERENTIAL/PLATELET
ABS IMMATURE GRANULOCYTES: 0.02 10*3/uL (ref 0.00–0.07)
Basophils Absolute: 0 10*3/uL (ref 0.0–0.1)
Basophils Relative: 0 %
EOS ABS: 0 10*3/uL (ref 0.0–0.5)
Eosinophils Relative: 0 %
HCT: 39.8 % (ref 39.0–52.0)
Hemoglobin: 12.2 g/dL — ABNORMAL LOW (ref 13.0–17.0)
IMMATURE GRANULOCYTES: 0 %
Lymphocytes Relative: 8 %
Lymphs Abs: 0.5 10*3/uL — ABNORMAL LOW (ref 0.7–4.0)
MCH: 24.6 pg — ABNORMAL LOW (ref 26.0–34.0)
MCHC: 30.7 g/dL (ref 30.0–36.0)
MCV: 80.2 fL (ref 80.0–100.0)
Monocytes Absolute: 0.1 10*3/uL (ref 0.1–1.0)
Monocytes Relative: 2 %
Neutro Abs: 6.1 10*3/uL (ref 1.7–7.7)
Neutrophils Relative %: 90 %
PLATELETS: 534 10*3/uL — AB (ref 150–400)
RBC: 4.96 MIL/uL (ref 4.22–5.81)
RDW: 12.3 % (ref 11.5–15.5)
WBC: 6.8 10*3/uL (ref 4.0–10.5)
nRBC: 0 % (ref 0.0–0.2)

## 2018-03-09 LAB — HEPATITIS B CORE ANTIBODY, TOTAL: Hep B Core Total Ab: NEGATIVE

## 2018-03-09 LAB — SURGICAL PCR SCREEN
MRSA, PCR: NEGATIVE
Staphylococcus aureus: POSITIVE — AB

## 2018-03-09 LAB — HEPARIN LEVEL (UNFRACTIONATED)
Heparin Unfractionated: 0.11 IU/mL — ABNORMAL LOW (ref 0.30–0.70)
Heparin Unfractionated: 0.24 IU/mL — ABNORMAL LOW (ref 0.30–0.70)
Heparin Unfractionated: 0.3 IU/mL (ref 0.30–0.70)

## 2018-03-09 LAB — HEPATITIS B SURFACE ANTIGEN: Hepatitis B Surface Ag: NEGATIVE

## 2018-03-09 LAB — URIC ACID: Uric Acid, Serum: 4.5 mg/dL (ref 3.7–8.6)

## 2018-03-09 LAB — HEPATITIS C ANTIBODY: HCV Ab: <0.1 s/co ratio (ref 0.0–0.9)

## 2018-03-09 LAB — BETA HCG QUANT (REF LAB): hCG Quant: <1 m[IU]/mL (ref 0–3)

## 2018-03-09 LAB — GLUCOSE, CAPILLARY
Glucose-Capillary: 147 mg/dL — ABNORMAL HIGH (ref 70–99)
Glucose-Capillary: 116 mg/dL — ABNORMAL HIGH (ref 70–99)

## 2018-03-09 LAB — PREPARE RBC (CROSSMATCH)

## 2018-03-09 MED ORDER — INSULIN ASPART 100 UNIT/ML ~~LOC~~ SOLN
0.0000 [IU] | Freq: Three times a day (TID) | SUBCUTANEOUS | Status: DC
  Administered 2018-03-09: 1 [IU] via SUBCUTANEOUS

## 2018-03-09 MED ORDER — PANTOPRAZOLE SODIUM 40 MG PO TBEC
40.0000 mg | DELAYED_RELEASE_TABLET | Freq: Every day | ORAL | Status: DC
  Administered 2018-03-09 – 2018-03-19 (×11): 40 mg via ORAL
  Filled 2018-03-09 (×12): qty 1

## 2018-03-09 MED ORDER — DEXAMETHASONE SODIUM PHOSPHATE 10 MG/ML IJ SOLN
20.0000 mg | Freq: Every day | INTRAMUSCULAR | Status: AC
  Administered 2018-03-09 – 2018-03-11 (×3): 20 mg via INTRAVENOUS
  Filled 2018-03-09 (×4): qty 2

## 2018-03-09 MED ORDER — CEFAZOLIN SODIUM-DEXTROSE 2-4 GM/100ML-% IV SOLN
2.0000 g | INTRAVENOUS | Status: AC
  Administered 2018-03-10: 2 g via INTRAVENOUS
  Filled 2018-03-09 (×2): qty 100

## 2018-03-09 NOTE — Progress Notes (Signed)
ANTICOAGULATION CONSULT NOTE - Follow Up Consult  Pharmacy Consult for heparin Indication: pulmonary embolus  Labs: Recent Labs    03/07/18 1535 03/07/18 1540 03/08/18 0240 03/09/18 0014  HGB 14.1  --  12.1*  --   HCT 45.6  --  38.0*  --   PLT 319  --  457*  --   HEPARINUNFRC  --   --  <0.10* 0.11*  CREATININE 0.96  --   --   --   TROPONINI  --  <0.03  --   --     Assessment: 22yo male subtherapeutic on heparin after resumed; no gtt issues or signs of bleeding per RN.  Goal of Therapy:  Heparin level 0.3-0.7 units/ml   Plan:  Will increase heparin gtt by 4 units/kg/hr to 1300 units/hr and check level in 6 hours.    Wynona Neat, PharmD, BCPS  03/09/2018,1:07 AM

## 2018-03-09 NOTE — Progress Notes (Signed)
ANTICOAGULATION CONSULT NOTE - Follow Up Consult  Pharmacy Consult for heparin Indication: pulmonary embolus   HEPARIN DW (KG): 52.9   Labs: Recent Labs    03/07/18 1535 03/07/18 1540  03/08/18 0240 03/09/18 0014 03/09/18 0215 03/09/18 0728 03/09/18 1627  HGB 14.1  --   --  12.1*  --  12.2*  --   --   HCT 45.6  --   --  38.0*  --  39.8  --   --   PLT 319  --   --  457*  --  534*  --   --   HEPARINUNFRC  --   --    < > <0.10* 0.11*  --  0.30 0.24*  CREATININE 0.96  --   --   --   --  0.77  --   --   TROPONINI  --  <0.03  --   --   --   --   --   --    < > = values in this interval not displayed.    Assessment: 22yo male restarted on heparin for new PE yesterday after thoracentesis. Heparin level is now subtherapeutic on 1300 units/hr. No bleeding noted  Goal of Therapy:  Heparin level 0.3-0.7 units/ml   Plan:  Increase heparin infusion to 1500 units/hr  Check an 8 hr heparin level Daily heparin level and CBC  Salome Arnt, PharmD, BCPS Please see AMION for all pharmacy numbers 03/09/2018 5:06 PM

## 2018-03-09 NOTE — Progress Notes (Signed)
Heart rate went up to 130's-170's as he is praying and singing praise songs with family and friends at bedside. Instructed to calm down and  Taught relaxation technique , heart rate down to 116. Continue to monitor.

## 2018-03-09 NOTE — Progress Notes (Signed)
VASCULAR LAB PRELIMINARY  PRELIMINARY  PRELIMINARY  PRELIMINARY  Bilateral lower extremity venous duplex completed.    Preliminary report:  There is no DVT or SVT noted in the bilateral lower extremities.  There is sluggish flow noted throughout the bilateral lower extremities.   Brightyn Mozer, RVT 03/09/2018, 6:05 PM

## 2018-03-09 NOTE — Progress Notes (Signed)
PROGRESS NOTE    Nicholas Moreno   CWC:[id number]  DOB: 1995/05/04  DOA: 03/07/2018 PCP: Patient, No Pcp Per   Brief Narrative:  Nicholas Moreno is a 22 y/o male with cough and dyspnea for 2 months now. He has seen at least 5 different provider and has been prescribed inhalers for asthma. Symptoms have progressed and thus he presented to the ED.  CT in ED> large, necrotic, cavitary mediastinal mass with adjacent atelectasis,Marked narrowing of the superior vena cava is noted and there is associated collateral vessel formation , Narrowing and displacement of the trachea with complete occlusion of the upper lobe bronchi. There is also marked narrowing of bilateral upper lobe pulmonary arteries.  -mod left pleural effusion and small RUL PE.  Admitted by critical care team, started on heparin infusion Underwent thoracentesis on 11/30 of 1000 cc. CT surgery consulted.  Plans are for surgical biopsy of mediastinal mass tomorrow by Dr Lawson Fiscal.  Subjective: Cough is dry and mild. Has been ambulating to the bathroom without dyspnea. No pain and no other complaints.  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 - LDH elevated - Uric acid normal - AFP tumor marker pending - appreciate CT surgery eval- for biopsy tomorrow in OR - evaluated by oncology, Dr Eulogio Bear Vena Cava compression from mass - high dose steroids started by Oncology empirically - also on empiric Protonix - follow for symptoms  Hyperglycemia - due to steroids - add ISS low dose   Left pleural effusion -1 L removed- no organisms on gram stain- Exudative- likely related to cancer    Single subsegmental pulmonary embolism  - very small PE- cont Heparin infusion for now until all procedures complete      DVT prophylaxis: Heparin infusion Code Status: Full code Family Communication:  parents at bedside Disposition Plan: f/u work up- d/c Telemetry Consultants:   Oncology  CT surgery  Admitted by PCCM Procedures:   Thoracentesis on 11/30 Antimicrobials:  Anti-infectives (From admission, onward)   Start     Dose/Rate Route Frequency Ordered Stop   03/09/18 1319  ceFAZolin (ANCEF) IVPB 2g/100 mL premix     2 g 200 mL/hr over 30 Minutes Intravenous 30 min pre-op 03/09/18 1319         Objective: Vitals:   03/08/18 2112 03/09/18 0106 03/09/18 0725 03/09/18 1139  BP: 123/76 111/69 105/61 118/71  Pulse: (!) 107 90    Resp: 20  (!) 21 20  Temp: 97.8 F (36.6 C) 97.6 F (36.4 C) 97.7 F (36.5 C) 97.7 F (36.5 C)  TempSrc: Oral Oral Oral Oral  SpO2: 96% 98% 97% 98%  Weight:      Height:        Intake/Output Summary (Last 24 hours) at 03/09/2018 1508 Last data filed at 03/09/2018 1500 Gross per 24 hour  Intake 3216.01 ml  Output -  Net 3216.01 ml   Filed Weights   03/07/18 1445 03/07/18 2000 03/08/18 0456  Weight: 54.4 kg 52.9 kg 52.9 kg    Examination: General exam: Appears comfortable  HEENT: PERRLA, oral mucosa moist, no sclera icterus or thrush Respiratory system: Clear to auscultation. Respiratory effort normal.  Cardiovascular system: S1 & S2 heard, RRR.   Gastrointestinal system: Abdomen soft, non-tender, nondistended. Normal bowel sounds. Central nervous system: Alert and oriented. No focal neurological deficits. Extremities: No cyanosis, clubbing or edema Skin: No rashes  or ulcers Psychiatry:  Mood & affect appropriate.     Data Reviewed: I have personally reviewed following labs and imaging studies  CBC: Recent Labs  Lab 03/07/18 1535 03/08/18 0240 03/09/18 0215  WBC 7.3 8.2 6.8  NEUTROABS 5.3 6.1 6.1  HGB 14.1 12.1* 12.2*  HCT 45.6 38.0* 39.8  MCV 82.2 80.2 80.2  PLT 319 457* 998*   Basic Metabolic Panel: Recent Labs  Lab 03/07/18 1535 03/09/18 0215  NA 135 133*  K 4.2 3.9  CL 98 98  CO2 27 22  GLUCOSE 87 213*    BUN 7 8  CREATININE 0.96 0.77  CALCIUM 8.7* 8.7*   GFR: Estimated Creatinine Clearance: 109.3 mL/min (by C-G formula based on SCr of 0.77 mg/dL). Liver Function Tests: Recent Labs  Lab 03/07/18 1927 03/08/18 1545 03/09/18 0215  AST 32  --  28  ALT 24  --  24  ALKPHOS 54  --  54  BILITOT 0.3  --  0.4  PROT 6.7 6.7 6.0*  ALBUMIN 2.9*  --  2.4*   No results for input(s): LIPASE, AMYLASE in the last 168 hours. No results for input(s): AMMONIA in the last 168 hours. Coagulation Profile: No results for input(s): INR, PROTIME in the last 168 hours. Cardiac Enzymes: Recent Labs  Lab 03/07/18 1540  TROPONINI <0.03   BNP (last 3 results) No results for input(s): PROBNP in the last 8760 hours. HbA1C: No results for input(s): HGBA1C in the last 72 hours. CBG: Recent Labs  Lab 03/07/18 2001  GLUCAP 90   Lipid Profile: Recent Labs    03/08/18 1545  CHOL 123   Thyroid Function Tests: No results for input(s): TSH, T4TOTAL, FREET4, T3FREE, THYROIDAB in the last 72 hours. Anemia Panel: No results for input(s): VITAMINB12, FOLATE, FERRITIN, TIBC, IRON, RETICCTPCT in the last 72 hours. Urine analysis: No results found for: COLORURINE, APPEARANCEUR, LABSPEC, [place], GLUCOSEU, HGBUR, BILIRUBINUR, [place], [place], UROBILINOGEN, NITRITE, LEUKOCYTESUR Sepsis Labs: @LABRCNTIP (procalcitonin:4,lacticidven:4) ) Recent Results (from the past 240 hour(s))  MRSA PCR Screening     Status: None   Collection Time: 03/07/18  7:58 PM  Result Value Ref Range Status   MRSA by PCR NEGATIVE NEGATIVE Final    Comment:        The GeneXpert MRSA Assay (FDA approved for NASAL specimens only), is one component of a comprehensive MRSA colonization surveillance program. It is not intended to diagnose MRSA infection nor to guide or monitor treatment for MRSA infections. Performed at [facility], [place] [address], [place], [place] [address]   Body fluid culture     Status: None  (Preliminary result)   Collection Time: 03/08/18  3:24 PM  Result Value Ref Range Status   Specimen Description PLEURAL LEFT  Final   Special Requests NONE  Final   Gram Stain   Final    ABUNDANT WBC PRESENT, PREDOMINANTLY MONONUCLEAR NO ORGANISMS SEEN    Culture   Final    NO GROWTH < 24 HOURS Performed at [facility], [place] [address] [place], [place] [address]    Report Status PENDING  Incomplete         Radiology Studies: Ct Angio Chest Pe W And/or Wo Contrast  Result Date: 03/07/2018 CLINICAL DATA:  Shortness of breath and cough. EXAM: CT ANGIOGRAPHY CHEST WITH CONTRAST TECHNIQUE: Multidetector CT imaging of the chest was performed using the standard protocol during bolus administration of intravenous contrast. Multiplanar CT image reconstructions and MIPs were obtained to evaluate  the vascular anatomy. CONTRAST:  180mL ISOVUE-370 IOPAMIDOL (ISOVUE-370) INJECTION 76% COMPARISON:  None. FINDINGS: Cardiovascular: Normal heart size. The main pulmonary artery appears patent. Filling defect within the left upper lobe lobar pulmonary artery, image 104 through image 101 compatible with acute pulmonary emboli. Mediastinum/Nodes: A very large anterior mediastinal mass is identified which extends into both upper lung zones. This mass encases and narrows the superior vena cava as well as the branch vessels off the aortic arch. There is extensive chest wall collaterals especially in the left supraclavicular region and paraspinal region. Encasement and narrowing of the left main pulmonary artery and bilateral upper lobe pulmonary arteries. Encasement and narrowing of the trachea is identified with rightward and posterior displacement. The left mainstem bronchus is narrowed, and bilateral upper lobe bronchi are occluded. Narrowing of the left lower lobe bronchi. Lungs/Pleura: There is a moderate left pleural effusion. Approximately 90% opacification of the left upper lobe is identified secondary  to tumor and postobstructive consolidation. A small amount of aerated lung within the apex. Compressive type atelectasis is noted within the left lung base. There is approximately 50% opacification of the right upper lobe with patchy areas of airspace consolidation and ground-glass attenuation within the remaining portions of the right upper lobe. The mass within the right upper lobe contains central areas of cavitation compatible with necrosis. Upper Abdomen: No acute findings. Musculoskeletal: No chest wall abnormality. No acute or significant osseous findings. Review of the MIP images confirms the above findings. IMPRESSION: 1. Small acute pulmonary emboli identified within the distal left upper lobe lobar pulmonary artery. 2. 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 Primary differential considerations include lymphoma and leukemia as well as malignant derm cell tumors. 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 Narrowing and displacement of the trachea with complete occlusion of the upper lobe bronchi. There is also marked narrowing of bilateral upper lobe pulmonary arteries. 3. Significantly diminished aeration to both upper lobes, left greater than right. 4. Moderate left pleural effusion. Critical Value/emergent results were called by telephone at the time of interpretation on 03/07/2018 at 5:29 pm to Dr. Carlisle Cater , who verbally acknowledged these results. Electronically Signed   By: Kerby Moors M.D.   On: 03/07/2018 17:30   Dg Chest Port 1 View  Result Date: 03/08/2018 CLINICAL DATA:  Status post left thoracentesis EXAM: PORTABLE CHEST 1 VIEW COMPARISON:  Chest CT from 1 day prior FINDINGS: Near complete opacification of the left hemithorax with asymmetric volume loss in the left hemithorax, not  substantially changed. Marked thickening of the upper mediastinum bilaterally, unchanged. Cardiac silhouette is obscured, with the heart appearing normal in size. No pneumothorax. No right pleural effusion. Persistent moderate left pleural effusion. Stable masslike opacity in the medial upper right lung with right upper lobe volume loss. IMPRESSION: 1. No pneumothorax.  Persistent moderate left pleural effusion. 2. Stable marked thickening of the upper mediastinum bilaterally compatible with known infiltrative anterior mediastinal mass. 3. Persistent volume loss in the left hemithorax and right upper lung. Stable near complete left lung opacification and masslike medial right upper lung opacity compatible with a combination of atelectasis and tumor. Electronically Signed   By: Ilona Sorrel M.D.   On: 03/08/2018 15:35      Scheduled Meds: . allopurinol  100 mg Oral BID  . dexamethasone  20 mg Intravenous  Daily  . pantoprazole  40 mg Oral Daily   Continuous Infusions: .  ceFAZolin (ANCEF) IV    . heparin 1,400 Units/hr (03/09/18 1200)     LOS: 2 days    Time spent in minutes: 35    Debbe Odea, MD [facility] Pager: [email or web address] Password [email or web address] 03/09/2018, 3:08 PM

## 2018-03-09 NOTE — Progress Notes (Signed)
ANTICOAGULATION CONSULT NOTE - Follow Up Consult  Pharmacy Consult for heparin Indication: pulmonary embolus   HEPARIN DW (KG): 52.9   Labs: Recent Labs    03/07/18 1535 03/07/18 1540 03/08/18 0240 03/09/18 0014 03/09/18 0215 03/09/18 0728  HGB 14.1  --  12.1*  --  12.2*  --   HCT 45.6  --  38.0*  --  39.8  --   PLT 319  --  457*  --  534*  --   HEPARINUNFRC  --   --  <0.10* 0.11*  --  0.30  CREATININE 0.96  --   --   --  0.77  --   TROPONINI  --  <0.03  --   --   --   --     Assessment: 22yo male restarted on heparin for new PE yesterday after thoracentesis. Heparin level just therapeutic at 0.3 on heparin 1300 units/hr. CBC okay. No signs/symptoms of bleeding or issues with infusion reported by nursing.  Goal of Therapy:  Heparin level 0.3-0.7 units/ml   Plan:  Increase heparin infusion to 1400 units/hr to ensure patient remains therapeutic with newly diagnosed PE. Check anti-Xa level in 6 hours and daily while on heparin Continue to monitor H&H and platelets   Claiborne Billings, PharmD PGY2 Cardiology Pharmacy Resident Phone [phone] Please check AMION for all Pharmacist numbers by unit 03/09/2018 11:00 AM

## 2018-03-09 NOTE — Progress Notes (Signed)
  Order seen for image guided biopsy.  Chart reviewed.  CT Surgery note seen and Dr. Prescott Gum plans to perform bronchoscopy, mediastinoscopy and if needed left VATS anterior thoracotomy for biopsy of mediastinal mass in OR tomorrow afternoon.    WENDY S BLAIR PA-C 03/09/2018 2:00 PM

## 2018-03-09 NOTE — Progress Notes (Signed)
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  Patient symptomatically improved on Decadron therapy.  Blood sugar is now elevated.  Oncology recommends surgical biopsy to obtain fresh viable nonnecrotic tumor tissue. I have discussed bronchoscopy, mediastinoscopy and if needed left VATS anterior thoracotomy for biopsy of mediastinal mass in OR tomorrow afternoon.  The heparin will be stopped 4-6 hours prior to surgery.  I discussed the details of surgery including the location of the incisions, the use of general anesthesia, and the expected postoperative recovery.  Patient knows that if a VATS approach is needed he will have a postoperative chest tube drain.  Patient understands the risks to him of the procedure including the risks of bleeding, blood transfusion, pulmonary insufficiency requiring prolonged intubation, death.  He agrees to proceed with surgery tomorrow. Objective: Vital signs in last 24 hours: Temp:  [97.6 F (36.4 C)-98.3 F (36.8 C)] 97.7 F (36.5 C) (12/01 1139) Pulse Rate:  [89-112] 90 (12/01 0106) Cardiac Rhythm: Normal sinus rhythm (12/01 1139) Resp:  [20-35] 20 (12/01 1139) BP: (102-123)/(59-77) 118/71 (12/01 1139) SpO2:  [95 %-98 %] 98 % (12/01 1139)  Hemodynamic parameters for last 24 hours:    Intake/Output from previous day: 11/30 0701 - 12/01 0700 In: 2790 [P.O.:2640; I.V.:150] Out: 1000  Intake/Output this shift: Total I/O In: 186 [P.O.:120; I.V.:66] Out: -   Sinus tachycardia Mild JVD Tubular breath sounds left upper lung field Neuro intact  Lab Results: Recent Labs    03/08/18 0240 03/09/18 0215   WBC 8.2 6.8  HGB 12.1* 12.2*  HCT 38.0* 39.8  PLT 457* 534*   BMET:  Recent Labs    03/07/18 1535 03/09/18 0215  NA 135 133*  K 4.2 3.9  CL 98 98  CO2 27 22  GLUCOSE 87 213*  BUN 7 8  CREATININE 0.96 0.77  CALCIUM 8.7* 8.7*    PT/INR: No results for input(s): LABPROT, INR in the last 72 hours. ABG No results found for: PHART, HCO3, TCO2, ACIDBASEDEF, O2SAT CBG (last 3)  Recent Labs    03/07/18 2001  GLUCAP 90    Assessment/Plan: S/P Procedure(s) (LRB): VIDEO BRONCHOSCOPY/MEDIASTINOSCOPY (Left) VIDEO ASSISTED THORACOSCOPY (VATS)/ BIOPSY (Left) Plan surgical biopsy of  mediastinal mass to obtain viable nonnecrotic tissue which can be used for lymphoma markers to help direct therapy for this very nice 22 year old male.   LOS: 2 days    Tharon Aquas Trigt III 03/09/2018

## 2018-03-10 ENCOUNTER — Encounter (HOSPITAL_COMMUNITY): Payer: Self-pay | Admitting: Certified Registered"

## 2018-03-10 ENCOUNTER — Inpatient Hospital Stay (HOSPITAL_COMMUNITY): Payer: BLUE CROSS/BLUE SHIELD | Admitting: Anesthesiology

## 2018-03-10 ENCOUNTER — Inpatient Hospital Stay (HOSPITAL_COMMUNITY): Payer: BLUE CROSS/BLUE SHIELD

## 2018-03-10 ENCOUNTER — Encounter (HOSPITAL_COMMUNITY)
Admission: EM | Disposition: A | Discharge status: Home / Self Care | Payer: Self-pay | Source: Home / Self Care | Attending: Internal Medicine

## 2018-03-10 HISTORY — PX: MEDIASTINOSCOPY: SHX5086

## 2018-03-10 HISTORY — PX: VIDEO ASSISTED THORACOSCOPY (VATS)/ LYMPH NODE SAMPLING: SHX6170

## 2018-03-10 HISTORY — PX: VIDEO BRONCHOSCOPY: SHX5072

## 2018-03-10 LAB — CBC WITH DIFFERENTIAL/PLATELET
Abs Immature Granulocytes: 0.05 10*3/uL (ref 0.00–0.07)
Abs Immature Granulocytes: 0.06 10*3/uL (ref 0.00–0.07)
Basophils Absolute: 0 10*3/uL (ref 0.0–0.1)
Basophils Absolute: 0 10*3/uL (ref 0.0–0.1)
Basophils Relative: 0 %
Basophils Relative: 0 %
Eosinophils Absolute: 0 10*3/uL (ref 0.0–0.5)
Eosinophils Absolute: 0 10*3/uL (ref 0.0–0.5)
Eosinophils Relative: 0 %
Eosinophils Relative: 0 %
HCT: 38.3 % — ABNORMAL LOW (ref 39.0–52.0)
HCT: 40.2 % (ref 39.0–52.0)
HEMOGLOBIN: 12 g/dL — AB (ref 13.0–17.0)
Hemoglobin: 12.8 g/dL — ABNORMAL LOW (ref 13.0–17.0)
Immature Granulocytes: 0 %
Immature Granulocytes: 0 %
LYMPHS PCT: 5 %
Lymphocytes Relative: 5 %
Lymphs Abs: 0.8 10*3/uL (ref 0.7–4.0)
Lymphs Abs: 0.8 10*3/uL (ref 0.7–4.0)
MCH: 24.8 pg — ABNORMAL LOW (ref 26.0–34.0)
MCH: 25.3 pg — AB (ref 26.0–34.0)
MCHC: 31.3 g/dL (ref 30.0–36.0)
MCHC: 31.8 g/dL (ref 30.0–36.0)
MCV: 79.3 fL — ABNORMAL LOW (ref 80.0–100.0)
MCV: 79.4 fL — ABNORMAL LOW (ref 80.0–100.0)
MONO ABS: 0.8 10*3/uL (ref 0.1–1.0)
Monocytes Absolute: 0.8 10*3/uL (ref 0.1–1.0)
Monocytes Relative: 5 %
Monocytes Relative: 5 %
NRBC: 0 % (ref 0.0–0.2)
Neutro Abs: 15.8 10*3/uL — ABNORMAL HIGH (ref 1.7–7.7)
Neutro Abs: 14.7 10*3/uL — ABNORMAL HIGH (ref 1.7–7.7)
Neutrophils Relative %: 90 %
Neutrophils Relative %: 90 %
Platelets: 597 10*3/uL — ABNORMAL HIGH (ref 150–400)
Platelets: 550 10*3/uL — ABNORMAL HIGH (ref 150–400)
RBC: 4.83 MIL/uL (ref 4.22–5.81)
RBC: 5.06 MIL/uL (ref 4.22–5.81)
RDW: 12.3 % (ref 11.5–15.5)
RDW: 12.3 % (ref 11.5–15.5)
WBC: 17.5 10*3/uL — ABNORMAL HIGH (ref 4.0–10.5)
WBC: 16.3 10*3/uL — ABNORMAL HIGH (ref 4.0–10.5)
nRBC: 0 % (ref 0.0–0.2)

## 2018-03-10 LAB — COMPREHENSIVE METABOLIC PANEL
ALT: 26 U/L (ref 0–44)
AST: 20 U/L (ref 15–41)
Albumin: 2.6 g/dL — ABNORMAL LOW (ref 3.5–5.0)
Alkaline Phosphatase: 52 U/L (ref 38–126)
Anion gap: 6 (ref 5–15)
BUN: 9 mg/dL (ref 6–20)
CHLORIDE: 102 mmol/L (ref 98–111)
CO2: 31 mmol/L (ref 22–32)
Calcium: 8.8 mg/dL — ABNORMAL LOW (ref 8.9–10.3)
Creatinine, Ser: 0.67 mg/dL (ref 0.61–1.24)
GFR calc Af Amer: >60 mL/min (ref >60)
GFR calc non Af Amer: >60 mL/min (ref >60)
Glucose, Bld: 107 mg/dL — ABNORMAL HIGH (ref 70–99)
Potassium: 4 mmol/L (ref 3.5–5.1)
Sodium: 139 mmol/L (ref 135–145)
Total Bilirubin: 0.2 mg/dL — ABNORMAL LOW (ref 0.3–1.2)
Total Protein: 5.9 g/dL — ABNORMAL LOW (ref 6.5–8.1)

## 2018-03-10 LAB — GLUCOSE, CAPILLARY
Glucose-Capillary: 101 mg/dL — ABNORMAL HIGH (ref 70–99)
Glucose-Capillary: 94 mg/dL (ref 70–99)
Glucose-Capillary: 111 mg/dL — ABNORMAL HIGH (ref 70–99)

## 2018-03-10 LAB — FLOW CYTOMETRY REQUEST - FLUID (INPATIENT)

## 2018-03-10 LAB — AFP TUMOR MARKER: AFP, Serum, Tumor Marker: 1.1 ng/mL (ref 0.0–8.3)

## 2018-03-10 LAB — HEPARIN LEVEL (UNFRACTIONATED): Heparin Unfractionated: 0.53 IU/mL (ref 0.30–0.70)

## 2018-03-10 LAB — PROTIME-INR
INR: 1.09
Prothrombin Time: 14 seconds (ref 11.4–15.2)

## 2018-03-10 LAB — PH, BODY FLUID: pH, Body Fluid: 7.9

## 2018-03-10 LAB — PATHOLOGIST SMEAR REVIEW

## 2018-03-10 LAB — LACTATE DEHYDROGENASE: LDH: 438 U/L — ABNORMAL HIGH (ref 98–192)

## 2018-03-10 SURGERY — BRONCHOSCOPY, VIDEO-ASSISTED
Anesthesia: General | Site: Chest

## 2018-03-10 MED ORDER — POTASSIUM CHLORIDE 10 MEQ/50ML IV SOLN
10.0000 meq | Freq: Every day | INTRAVENOUS | Status: DC | PRN
  Filled 2018-03-10: qty 50

## 2018-03-10 MED ORDER — MUPIROCIN 2 % EX OINT
1.0000 "application " | TOPICAL_OINTMENT | Freq: Two times a day (BID) | CUTANEOUS | Status: DC
  Administered 2018-03-10: 1 via TOPICAL

## 2018-03-10 MED ORDER — SUGAMMADEX SODIUM 200 MG/2ML IV SOLN
INTRAVENOUS | Status: DC | PRN
  Administered 2018-03-10: 200 mg via INTRAVENOUS

## 2018-03-10 MED ORDER — MIDAZOLAM HCL 2 MG/2ML IJ SOLN
1.0000 mg | Freq: Once | INTRAMUSCULAR | Status: AC
  Administered 2018-03-10: 1 mg via INTRAVENOUS

## 2018-03-10 MED ORDER — TRAMADOL HCL 50 MG PO TABS
50.0000 mg | ORAL_TABLET | Freq: Four times a day (QID) | ORAL | Status: DC | PRN
  Administered 2018-03-10 – 2018-03-11 (×2): 50 mg via ORAL
  Filled 2018-03-10 (×2): qty 1

## 2018-03-10 MED ORDER — FENTANYL CITRATE (PF) 100 MCG/2ML IJ SOLN
INTRAMUSCULAR | Status: AC
  Administered 2018-03-10: 50 ug via INTRAVENOUS
  Filled 2018-03-10: qty 2

## 2018-03-10 MED ORDER — ONDANSETRON HCL 4 MG/2ML IJ SOLN: 4.0000 mg | Freq: Four times a day (QID) | INTRAMUSCULAR | Status: DC | PRN

## 2018-03-10 MED ORDER — CEFAZOLIN SODIUM-DEXTROSE 2-4 GM/100ML-% IV SOLN
2.0000 g | Freq: Three times a day (TID) | INTRAVENOUS | Status: AC
  Administered 2018-03-11 (×2): 2 g via INTRAVENOUS
  Filled 2018-03-10 (×2): qty 100

## 2018-03-10 MED ORDER — MEPERIDINE HCL 50 MG/ML IJ SOLN: 6.2500 mg | INTRAMUSCULAR | Status: DC | PRN

## 2018-03-10 MED ORDER — HYDROMORPHONE HCL 1 MG/ML IJ SOLN
0.2500 mg | INTRAMUSCULAR | Status: DC | PRN
  Administered 2018-03-10 (×2): 0.5 mg via INTRAVENOUS

## 2018-03-10 MED ORDER — SUCCINYLCHOLINE CHLORIDE 200 MG/10ML IV SOSY
PREFILLED_SYRINGE | INTRAVENOUS | Status: DC | PRN
  Administered 2018-03-10: 60 mg via INTRAVENOUS

## 2018-03-10 MED ORDER — SODIUM CHLORIDE 0.9 % IV SOLN
INTRAVENOUS | Status: DC | PRN
  Administered 2018-03-10: 15 ug/min via INTRAVENOUS

## 2018-03-10 MED ORDER — KETAMINE HCL 10 MG/ML IJ SOLN
INTRAMUSCULAR | Status: DC | PRN
  Administered 2018-03-10: 50 mg via INTRAVENOUS

## 2018-03-10 MED ORDER — ACETAMINOPHEN 160 MG/5ML PO SOLN: 1000.0000 mg | Freq: Four times a day (QID) | ORAL | Status: AC

## 2018-03-10 MED ORDER — ROCURONIUM BROMIDE 50 MG/5ML IV SOSY
PREFILLED_SYRINGE | INTRAVENOUS | Status: DC | PRN
  Administered 2018-03-10: 20 mg via INTRAVENOUS
  Administered 2018-03-10: 10 mg via INTRAVENOUS

## 2018-03-10 MED ORDER — MIDAZOLAM HCL 5 MG/5ML IJ SOLN
INTRAMUSCULAR | Status: DC | PRN
  Administered 2018-03-10: 2 mg via INTRAVENOUS

## 2018-03-10 MED ORDER — ACETAMINOPHEN 500 MG PO TABS
1000.0000 mg | ORAL_TABLET | Freq: Four times a day (QID) | ORAL | Status: AC
  Administered 2018-03-10 – 2018-03-15 (×18): 1000 mg via ORAL
  Filled 2018-03-10 (×18): qty 2

## 2018-03-10 MED ORDER — FENTANYL CITRATE (PF) 100 MCG/2ML IJ SOLN
INTRAMUSCULAR | Status: DC | PRN
  Administered 2018-03-10: 100 ug via INTRAVENOUS
  Administered 2018-03-10 (×2): 50 ug via INTRAVENOUS

## 2018-03-10 MED ORDER — MUPIROCIN 2 % EX OINT
TOPICAL_OINTMENT | CUTANEOUS | Status: AC
  Administered 2018-03-10: 1 via TOPICAL
  Filled 2018-03-10: qty 22

## 2018-03-10 MED ORDER — PHENYLEPHRINE 40 MCG/ML (10ML) SYRINGE FOR IV PUSH (FOR BLOOD PRESSURE SUPPORT)
PREFILLED_SYRINGE | INTRAVENOUS | Status: AC
  Filled 2018-03-10: qty 10

## 2018-03-10 MED ORDER — DEXAMETHASONE SODIUM PHOSPHATE 10 MG/ML IJ SOLN
INTRAMUSCULAR | Status: DC | PRN
  Administered 2018-03-10: 4 mg via INTRAVENOUS

## 2018-03-10 MED ORDER — POTASSIUM CHLORIDE IN NACL 20-0.9 MEQ/L-% IV SOLN
INTRAVENOUS | Status: DC
  Administered 2018-03-10 – 2018-03-19 (×4): via INTRAVENOUS
  Filled 2018-03-10 (×4): qty 1000

## 2018-03-10 MED ORDER — BISACODYL 5 MG PO TBEC
10.0000 mg | DELAYED_RELEASE_TABLET | Freq: Every day | ORAL | Status: DC
  Administered 2018-03-11 – 2018-03-19 (×8): 10 mg via ORAL
  Filled 2018-03-10 (×8): qty 2

## 2018-03-10 MED ORDER — SODIUM CHLORIDE 0.9 % IR SOLN
Status: DC | PRN
  Administered 2018-03-10: 200 mL

## 2018-03-10 MED ORDER — EPINEPHRINE PF 1 MG/ML IJ SOLN
INTRAMUSCULAR | Status: AC
  Filled 2018-03-10: qty 1

## 2018-03-10 MED ORDER — FENTANYL CITRATE (PF) 100 MCG/2ML IJ SOLN
50.0000 ug | INTRAMUSCULAR | Status: DC | PRN
  Administered 2018-03-10 – 2018-03-12 (×2): 50 ug via INTRAVENOUS
  Filled 2018-03-10 (×3): qty 2

## 2018-03-10 MED ORDER — INFLUENZA VAC SPLIT QUAD 0.5 ML IM SUSY: 0.5000 mL | PREFILLED_SYRINGE | INTRAMUSCULAR | Status: DC | PRN

## 2018-03-10 MED ORDER — PROPOFOL 10 MG/ML IV BOLUS
INTRAVENOUS | Status: AC
  Filled 2018-03-10: qty 20

## 2018-03-10 MED ORDER — ARTIFICIAL TEARS OPHTHALMIC OINT
TOPICAL_OINTMENT | OPHTHALMIC | Status: AC
  Filled 2018-03-10: qty 3.5

## 2018-03-10 MED ORDER — RACEPINEPHRINE HCL 2.25 % IN NEBU
0.5000 mL | INHALATION_SOLUTION | RESPIRATORY_TRACT | Status: DC
  Filled 2018-03-10: qty 0.5

## 2018-03-10 MED ORDER — 0.9 % SODIUM CHLORIDE (POUR BTL) OPTIME
TOPICAL | Status: DC | PRN
  Administered 2018-03-10: 1000 mL

## 2018-03-10 MED ORDER — PHENYLEPHRINE 40 MCG/ML (10ML) SYRINGE FOR IV PUSH (FOR BLOOD PRESSURE SUPPORT)
PREFILLED_SYRINGE | INTRAVENOUS | Status: DC | PRN
  Administered 2018-03-10: 120 ug via INTRAVENOUS
  Administered 2018-03-10 (×2): 80 ug via INTRAVENOUS

## 2018-03-10 MED ORDER — SODIUM CHLORIDE 0.9 % IV SOLN
INTRAVENOUS | Status: DC
  Administered 2018-03-10: 12:00:00 via INTRAVENOUS

## 2018-03-10 MED ORDER — LIDOCAINE 2% (20 MG/ML) 5 ML SYRINGE
INTRAMUSCULAR | Status: DC | PRN
  Administered 2018-03-10: 60 mg via INTRAVENOUS

## 2018-03-10 MED ORDER — DEXAMETHASONE SODIUM PHOSPHATE 10 MG/ML IJ SOLN
INTRAMUSCULAR | Status: AC
  Filled 2018-03-10: qty 1

## 2018-03-10 MED ORDER — ONDANSETRON HCL 4 MG/2ML IJ SOLN
INTRAMUSCULAR | Status: DC | PRN
  Administered 2018-03-10: 4 mg via INTRAVENOUS

## 2018-03-10 MED ORDER — HEMOSTATIC AGENTS (NO CHARGE) OPTIME
TOPICAL | Status: DC | PRN
  Administered 2018-03-10 (×3): 1 via TOPICAL

## 2018-03-10 MED ORDER — LACTATED RINGERS IV SOLN
INTRAVENOUS | Status: DC
  Administered 2018-03-10 (×3): via INTRAVENOUS

## 2018-03-10 MED ORDER — ROCURONIUM BROMIDE 50 MG/5ML IV SOSY
PREFILLED_SYRINGE | INTRAVENOUS | Status: AC
  Filled 2018-03-10: qty 5

## 2018-03-10 MED ORDER — HYDROMORPHONE HCL 1 MG/ML IJ SOLN
INTRAMUSCULAR | Status: AC
  Filled 2018-03-10: qty 1

## 2018-03-10 MED ORDER — MIDAZOLAM HCL 2 MG/2ML IJ SOLN
INTRAMUSCULAR | Status: AC
  Administered 2018-03-10: 1 mg via INTRAVENOUS
  Filled 2018-03-10: qty 2

## 2018-03-10 MED ORDER — PROMETHAZINE HCL 25 MG/ML IJ SOLN: 6.2500 mg | INTRAMUSCULAR | Status: DC | PRN

## 2018-03-10 MED ORDER — KETAMINE HCL 50 MG/5ML IJ SOSY
PREFILLED_SYRINGE | INTRAMUSCULAR | Status: AC
  Filled 2018-03-10: qty 10

## 2018-03-10 MED ORDER — MIDAZOLAM HCL 2 MG/2ML IJ SOLN: 0.5000 mg | Freq: Once | INTRAMUSCULAR | Status: DC | PRN

## 2018-03-10 MED ORDER — MIDAZOLAM HCL 2 MG/2ML IJ SOLN
INTRAMUSCULAR | Status: AC
  Filled 2018-03-10: qty 2

## 2018-03-10 MED ORDER — FENTANYL CITRATE (PF) 250 MCG/5ML IJ SOLN
INTRAMUSCULAR | Status: AC
  Filled 2018-03-10: qty 5

## 2018-03-10 MED ORDER — FENTANYL CITRATE (PF) 100 MCG/2ML IJ SOLN
50.0000 ug | Freq: Once | INTRAMUSCULAR | Status: AC
  Administered 2018-03-10: 50 ug via INTRAVENOUS

## 2018-03-10 MED ORDER — INSULIN ASPART 100 UNIT/ML ~~LOC~~ SOLN: 0.0000 [IU] | Freq: Four times a day (QID) | SUBCUTANEOUS | Status: DC

## 2018-03-10 SURGICAL SUPPLY — 106 items
BAG DECANTER FOR FLEXI CONT (MISCELLANEOUS) IMPLANT
BLADE SAW STERNAL (BLADE) ×5 IMPLANT
BLADE SURG 10 STRL SS (BLADE) ×5 IMPLANT
BLADE SURG 11 STRL SS (BLADE) IMPLANT
BLADE SURG 15 STRL LF DISP TIS (BLADE) ×3 IMPLANT
BLADE SURG 15 STRL SS (BLADE) ×2
BRUSH CYTOL CELLEBRITY 1.5X140 (MISCELLANEOUS) ×5 IMPLANT
CANISTER SUCT 3000ML PPV (MISCELLANEOUS) ×5 IMPLANT
CATH KIT ON Q 5IN SLV (PAIN MANAGEMENT) IMPLANT
CATH ROBINSON RED A/P 22FR (CATHETERS) IMPLANT
CATH THORACIC 28FR (CATHETERS) IMPLANT
CATH THORACIC 36FR (CATHETERS) IMPLANT
CATH THORACIC 36FR RT ANG (CATHETERS) IMPLANT
CLIP VESOCCLUDE MED 6/CT (CLIP) IMPLANT
CLIP VESOCCLUDE SM WIDE 24/CT (CLIP) ×5 IMPLANT
CLIP VESOCCLUDE SM WIDE 6/CT (CLIP) IMPLANT
CONT SPEC 4OZ CLIKSEAL STRL BL (MISCELLANEOUS) ×10 IMPLANT
COVER BACK TABLE 60X90IN (DRAPES) ×5 IMPLANT
COVER SURGICAL LIGHT HANDLE (MISCELLANEOUS) ×10 IMPLANT
COVER WAND RF STERILE (DRAPES) ×5 IMPLANT
DERMABOND ADVANCED (GAUZE/BANDAGES/DRESSINGS) ×4
DERMABOND ADVANCED .7 DNX12 (GAUZE/BANDAGES/DRESSINGS) ×6 IMPLANT
DRAPE LAPAROSCOPIC ABDOMINAL (DRAPES) ×5 IMPLANT
DRAPE LAPAROTOMY T 102X78X121 (DRAPES) ×5 IMPLANT
DRAPE WARM FLUID 44X44 (DRAPE) ×5 IMPLANT
DRSG AQUACEL AG ADV 3.5X14 (GAUZE/BANDAGES/DRESSINGS) ×5 IMPLANT
DRSG TEGADERM 4X4.75 (GAUZE/BANDAGES/DRESSINGS) ×5 IMPLANT
ELECT BLADE 4.0 EZ CLEAN MEGAD (MISCELLANEOUS) ×5
ELECT CAUTERY BLADE 6.4 (BLADE) ×5 IMPLANT
ELECT REM PT RETURN 9FT ADLT (ELECTROSURGICAL) ×5
ELECTRODE BLDE 4.0 EZ CLN MEGD (MISCELLANEOUS) ×3 IMPLANT
ELECTRODE REM PT RTRN 9FT ADLT (ELECTROSURGICAL) ×3 IMPLANT
FORCEPS RADIAL JAW LRG 4 PULM (INSTRUMENTS) ×3 IMPLANT
GAUZE 4X4 16PLY RFD (DISPOSABLE) ×5 IMPLANT
GAUZE SPONGE 2X2 8PLY NS (GAUZE/BANDAGES/DRESSINGS) ×5 IMPLANT
GAUZE SPONGE 2X2 8PLY STRL LF (GAUZE/BANDAGES/DRESSINGS) ×3 IMPLANT
GAUZE SPONGE 4X4 12PLY STRL (GAUZE/BANDAGES/DRESSINGS) ×5 IMPLANT
GAUZE SPONGE 4X4 12PLY STRL LF (GAUZE/BANDAGES/DRESSINGS) ×10 IMPLANT
GLOVE BIO SURGEON STRL SZ7.5 (GLOVE) ×10 IMPLANT
GOWN STRL REUS W/ TWL LRG LVL3 (GOWN DISPOSABLE) ×9 IMPLANT
GOWN STRL REUS W/TWL LRG LVL3 (GOWN DISPOSABLE) ×6
HEMOSTAT SURGICEL 2X14 (HEMOSTASIS) ×5 IMPLANT
KIT BASIN OR (CUSTOM PROCEDURE TRAY) ×5 IMPLANT
KIT CLEAN ENDO COMPLIANCE (KITS) ×5 IMPLANT
KIT SUCTION CATH 14FR (SUCTIONS) ×5 IMPLANT
KIT TURNOVER KIT B (KITS) ×5 IMPLANT
MARKER SKIN DUAL TIP RULER LAB (MISCELLANEOUS) IMPLANT
NEEDLE HYPO 22GX1.5 SAFETY (NEEDLE) IMPLANT
NS IRRIG 1000ML POUR BTL (IV SOLUTION) ×10 IMPLANT
OIL SILICONE PENTAX (PARTS (SERVICE/REPAIRS)) ×5 IMPLANT
PACK CHEST (CUSTOM PROCEDURE TRAY) ×5 IMPLANT
PACK SURGICAL SETUP 50X90 (CUSTOM PROCEDURE TRAY) ×5 IMPLANT
PAD ARMBOARD 7.5X6 YLW CONV (MISCELLANEOUS) ×10 IMPLANT
PAD ELECT DEFIB RADIOL ZOLL (MISCELLANEOUS) ×10 IMPLANT
PENCIL BUTTON HOLSTER BLD 10FT (ELECTRODE) ×10 IMPLANT
RADIAL JAW LRG 4 PULMONARY (INSTRUMENTS) ×2
SEALANT SURG COSEAL 4ML (VASCULAR PRODUCTS) ×5 IMPLANT
SOLUTION ANTI FOG 6CC (MISCELLANEOUS) ×5 IMPLANT
SPONGE GAUZE 2X2 STER 10/PKG (GAUZE/BANDAGES/DRESSINGS) ×2
SPONGE INTESTINAL PEANUT (DISPOSABLE) IMPLANT
SPONGE TONSIL 1.25 RF SGL STRG (GAUZE/BANDAGES/DRESSINGS) ×5 IMPLANT
STAPLER VISISTAT 35W (STAPLE) IMPLANT
SUT CHROMIC 3 0 SH 27 (SUTURE) IMPLANT
SUT ETHILON 3 0 PS 1 (SUTURE) IMPLANT
SUT PROLENE 3 0 SH DA (SUTURE) IMPLANT
SUT PROLENE 4 0 RB 1 (SUTURE)
SUT PROLENE 4-0 RB1 .5 CRCL 36 (SUTURE) IMPLANT
SUT PROLENE 6 0 C 1 30 (SUTURE) IMPLANT
SUT SILK  1 MH (SUTURE) ×4
SUT SILK 1 MH (SUTURE) ×6 IMPLANT
SUT SILK 1 TIES 10X30 (SUTURE) IMPLANT
SUT SILK 2 0 TIES 10X30 (SUTURE) IMPLANT
SUT SILK 2 0SH CR/8 30 (SUTURE) IMPLANT
SUT SILK 3 0SH CR/8 30 (SUTURE) IMPLANT
SUT VIC AB 1 CTX 18 (SUTURE) ×5 IMPLANT
SUT VIC AB 2 TP1 27 (SUTURE) IMPLANT
SUT VIC AB 2-0 CT1 27 (SUTURE) ×2
SUT VIC AB 2-0 CT1 TAPERPNT 27 (SUTURE) ×3 IMPLANT
SUT VIC AB 2-0 CT2 18 VCP726D (SUTURE) IMPLANT
SUT VIC AB 2-0 CTX 36 (SUTURE) ×5 IMPLANT
SUT VIC AB 3-0 SH 18 (SUTURE) IMPLANT
SUT VIC AB 3-0 SH 27 (SUTURE) ×2
SUT VIC AB 3-0 SH 27X BRD (SUTURE) ×3 IMPLANT
SUT VIC AB 3-0 SH 8-18 (SUTURE) ×10 IMPLANT
SUT VIC AB 3-0 X1 27 (SUTURE) ×10 IMPLANT
SUT VICRYL 0 UR6 27IN ABS (SUTURE) IMPLANT
SUT VICRYL 2 TP 1 (SUTURE) IMPLANT
SWAB COLLECTION DEVICE MRSA (MISCELLANEOUS) IMPLANT
SWAB CULTURE ESWAB REG 1ML (MISCELLANEOUS) IMPLANT
SYR 10ML LL (SYRINGE) ×5 IMPLANT
SYR 20ML ECCENTRIC (SYRINGE) ×5 IMPLANT
SYR 5ML LUER SLIP (SYRINGE) ×5 IMPLANT
SYR CONTROL 10ML LL (SYRINGE) IMPLANT
SYSTEM SAHARA CHEST DRAIN ATS (WOUND CARE) ×5 IMPLANT
TAPE CLOTH SURG 4X10 WHT LF (GAUZE/BANDAGES/DRESSINGS) ×10 IMPLANT
TIP APPLICATOR SPRAY EXTEND 16 (VASCULAR PRODUCTS) IMPLANT
TOWEL GREEN STERILE (TOWEL DISPOSABLE) ×5 IMPLANT
TOWEL GREEN STERILE FF (TOWEL DISPOSABLE) ×5 IMPLANT
TRAP SPECIMEN MUCOUS 40CC (MISCELLANEOUS) ×5 IMPLANT
TRAY FOLEY MTR SLVR 16FR STAT (SET/KITS/TRAYS/PACK) ×5 IMPLANT
TUBE CONNECTING 12'X1/4 (SUCTIONS) ×2
TUBE CONNECTING 12X1/4 (SUCTIONS) ×8 IMPLANT
TUBE CONNECTING 20'X1/4 (TUBING) ×2
TUBE CONNECTING 20X1/4 (TUBING) ×8 IMPLANT
VALVE DISPOSABLE (MISCELLANEOUS) ×5 IMPLANT
WATER STERILE IRR 1000ML POUR (IV SOLUTION) ×10 IMPLANT

## 2018-03-10 NOTE — Anesthesia Procedure Notes (Signed)
Central Venous Catheter Insertion Performed by: Catalina Gravel, MD, anesthesiologist Start/End12/05/2017 1:30 PM, 03/10/2018 1:40 PM Patient location: Pre-op. Preanesthetic checklist: patient identified, IV checked, site marked, risks and benefits discussed, surgical consent, monitors and equipment checked, pre-op evaluation, timeout performed and anesthesia consent Position: supine Lidocaine 1% used for infiltration and patient sedated Hand hygiene performed , maximum sterile barriers used  and Seldinger technique used Catheter size: 8 Fr Total catheter length 16. Central line was placed.Double lumen Procedure performed using ultrasound guided technique. Ultrasound Notes:anatomy identified, needle tip was noted to be adjacent to the nerve/plexus identified, no ultrasound evidence of intravascular and/or intraneural injection and image(s) printed for medical record Attempts: 1 Following insertion, dressing applied, line sutured and Biopatch. Post procedure assessment: blood return through all ports, free fluid flow and no air  Patient tolerated the procedure well with no immediate complications.

## 2018-03-10 NOTE — Progress Notes (Signed)
Marland Kitchen   HEMATOLOGY/ONCOLOGY INPATIENT PROGRESS NOTE  Date of Service: 03/10/2018  Inpatient Attending: .Debbe Odea, MD   SUBJECTIVE  Mr Maners was seen in f/u today with his parents at bedside. CTA images reviewed. He had a left sided thoracentesis and is scheduled for surgical biopsy on 03/10/2018. He notes his breathing and appetite as well as night sweats have improved after starting the high dose Dexamethasone. No other acute new symptoms.    OBJECTIVE: NAD  PHYSICAL EXAMINATION: . Vitals:   03/09/18 1615 03/09/18 2015 03/10/18 0105 03/10/18 0106  BP:  129/68 115/64 115/64  Pulse: (!) 115 91 84 80  Resp: (!) 27 (!) 24 (!) 29 (!) 28  Temp:  97.7 F (36.5 C)  97.7 F (36.5 C)  TempSrc:  Oral  Oral  SpO2: 99% 96% 95% 97%  Weight:      Height:       Filed Weights   03/07/18 1445 03/07/18 2000 03/08/18 0456  Weight: 120 lb (54.4 kg) 116 lb 10 oz (52.9 kg) 116 lb 10 oz (52.9 kg)   .Body mass index is 18.82 kg/m.  GENERAL:alert, in mild respiratory distress with increased shortness of breath if laying flat improved on sitting up. SKIN: no acute rashes, no significant lesions EYES: conjunctiva are pink and non-injected, sclera anicteric OROPHARYNX: MMM, no exudates, no oropharyngeal erythema or ulceration NECK: supple, no JVD LYMPH:  no palpable lymphadenopathy in the cervical, axillary or inguinal regions LUNGS: Decreased air entry throughout left lung zones, scattered rhonchi on the right. HEART: regular rate & rhythm ABDOMEN:  normoactive bowel sounds , non tender, not distended.                      No testicular swelling or tenderness to palpation. Extremity: no pedal edema PSYCH: alert & oriented x 3 with fluent speech NEURO: no focal motor/sensory deficits  MEDICAL HISTORY:  History reviewed. No pertinent past medical history.  SURGICAL HISTORY: History reviewed. No pertinent surgical history.  SOCIAL HISTORY: Social History   Socioeconomic  History  . Marital status: Single    Spouse name: Not on file  . Number of children: Not on file  . Years of education: Not on file  . Highest education level: Not on file  Occupational History  . Not on file  Social Needs  . Financial resource strain: Not on file  . Food insecurity:    Worry: Not on file    Inability: Not on file  . Transportation needs:    Medical: Not on file    Non-medical: Not on file  Tobacco Use  . Smoking status: Never Smoker  . Smokeless tobacco: Never Used  Substance and Sexual Activity  . Alcohol use: Not Currently  . Drug use: Not Currently  . Sexual activity: Not on file  Lifestyle  . Physical activity:    Days per week: Not on file    Minutes per session: Not on file  . Stress: Not on file  Relationships  . Social connections:    Talks on phone: Not on file    Gets together: Not on file    Attends religious service: Not on file    Active member of club or organization: Not on file    Attends meetings of clubs or organizations: Not on file    Relationship status: Not on file  . Intimate partner violence:    Fear of current or ex partner: Not on file    Emotionally  abused: Not on file    Physically abused: Not on file    Forced sexual activity: Not on file  Other Topics Concern  . Not on file  Social History Narrative  . Not on file    FAMILY HISTORY: History reviewed. No pertinent family history.  ALLERGIES:  has No Known Allergies.  MEDICATIONS:  Scheduled Meds: . allopurinol  100 mg Oral BID  . dexamethasone  20 mg Intravenous Daily  . insulin aspart  0-9 Units Subcutaneous TID WC  . pantoprazole  40 mg Oral Daily   Continuous Infusions: .  ceFAZolin (ANCEF) IV    . heparin 1,500 Units/hr (03/09/18 2203)   PRN Meds:.  REVIEW OF SYSTEMS:    10 Point review of Systems was done is negative except as noted above.   LABORATORY DATA:  I have reviewed the data as listed  . CBC Latest Ref Rng & Units 03/10/2018 03/09/2018  03/08/2018  WBC 4.0 - 10.5 K/uL 17.5(H) 6.8 8.2  Hemoglobin 13.0 - 17.0 g/dL 12.0(L) 12.2(L) 12.1(L)  Hematocrit 39.0 - 52.0 % 38.3(L) 39.8 38.0(L)  Platelets 150 - 400 K/uL 597(H) 534(H) 457(H)    . CMP Latest Ref Rng & Units 03/09/2018 03/08/2018 03/07/2018  Glucose 70 - 99 mg/dL 213(H) - 87  BUN 6 - 20 mg/dL 8 - 7  Creatinine 0.61 - 1.24 mg/dL 0.77 - 0.96  Sodium 135 - 145 mmol/L 133(L) - 135  Potassium 3.5 - 5.1 mmol/L 3.9 - 4.2  Chloride 98 - 111 mmol/L 98 - 98  CO2 22 - 32 mmol/L 22 - 27  Calcium 8.9 - 10.3 mg/dL 8.7(L) - 8.7(L)  Total Protein 6.5 - 8.1 g/dL 6.0(L) 6.7 6.7  Total Bilirubin 0.3 - 1.2 mg/dL 0.4 - 0.3  Alkaline Phos 38 - 126 U/L 54 - 54  AST 15 - 41 U/L 28 - 32  ALT 0 - 44 U/L 24 - 24     RADIOGRAPHIC STUDIES: I have personally reviewed the radiological images as listed and agreed with the findings in the report. Ct Angio Chest Pe W And/or Wo Contrast  Result Date: 03/07/2018 CLINICAL DATA:  Shortness of breath and cough. EXAM: CT ANGIOGRAPHY CHEST WITH CONTRAST TECHNIQUE: Multidetector CT imaging of the chest was performed using the standard protocol during bolus administration of intravenous contrast. Multiplanar CT image reconstructions and MIPs were obtained to evaluate the vascular anatomy. CONTRAST:  136mL ISOVUE-370 IOPAMIDOL (ISOVUE-370) INJECTION 76% COMPARISON:  None. FINDINGS: Cardiovascular: Normal heart size. The main pulmonary artery appears patent. Filling defect within the left upper lobe lobar pulmonary artery, image 104 through image 101 compatible with acute pulmonary emboli. Mediastinum/Nodes: A very large anterior mediastinal mass is identified which extends into both upper lung zones. This mass encases and narrows the superior vena cava as well as the branch vessels off the aortic arch. There is extensive chest wall collaterals especially in the left supraclavicular region and paraspinal region. Encasement and narrowing of the left main pulmonary  artery and bilateral upper lobe pulmonary arteries. Encasement and narrowing of the trachea is identified with rightward and posterior displacement. The left mainstem bronchus is narrowed, and bilateral upper lobe bronchi are occluded. Narrowing of the left lower lobe bronchi. Lungs/Pleura: There is a moderate left pleural effusion. Approximately 90% opacification of the left upper lobe is identified secondary to tumor and postobstructive consolidation. A small amount of aerated lung within the apex. Compressive type atelectasis is noted within the left lung base. There is approximately 50% opacification  of the right upper lobe with patchy areas of airspace consolidation and ground-glass attenuation within the remaining portions of the right upper lobe. The mass within the right upper lobe contains central areas of cavitation compatible with necrosis. Upper Abdomen: No acute findings. Musculoskeletal: No chest wall abnormality. No acute or significant osseous findings. Review of the MIP images confirms the above findings. IMPRESSION: 1. Small acute pulmonary emboli identified within the distal left upper lobe lobar pulmonary artery. 2. 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 Primary differential considerations include lymphoma and leukemia as well as malignant derm cell tumors. 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 Narrowing and displacement of the trachea with complete occlusion of the upper lobe bronchi. There is also marked narrowing of bilateral upper lobe pulmonary arteries. 3. Significantly diminished aeration to both upper lobes, left greater than right. 4. Moderate left pleural effusion. Critical Value/emergent results were called by telephone at the time of interpretation on 03/07/2018 at 5:29 pm to Dr.  Carlisle Cater , who verbally acknowledged these results. Electronically Signed   By: Kerby Moors M.D.   On: 03/07/2018 17:30   Dg Chest Port 1 View  Result Date: 03/08/2018 CLINICAL DATA:  Status post left thoracentesis EXAM: PORTABLE CHEST 1 VIEW COMPARISON:  Chest CT from 1 day prior FINDINGS: Near complete opacification of the left hemithorax with asymmetric volume loss in the left hemithorax, not substantially changed. Marked thickening of the upper mediastinum bilaterally, unchanged. Cardiac silhouette is obscured, with the heart appearing normal in size. No pneumothorax. No right pleural effusion. Persistent moderate left pleural effusion. Stable masslike opacity in the medial upper right lung with right upper lobe volume loss. IMPRESSION: 1. No pneumothorax.  Persistent moderate left pleural effusion. 2. Stable marked thickening of the upper mediastinum bilaterally compatible with known infiltrative anterior mediastinal mass. 3. Persistent volume loss in the left hemithorax and right upper lung. Stable near complete left lung opacification and masslike medial right upper lung opacity compatible with a combination of atelectasis and tumor. Electronically Signed   By: Ilona Sorrel M.D.   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 Comparison Study: No prior study on file for comparison Performing Technologist: Sharion Dove RVS  Examination Guidelines: A complete evaluation includes B-mode imaging, spectral Doppler, color Doppler, and power Doppler as needed of all accessible portions of each vessel. Bilateral testing is considered an integral part of a complete examination. Limited examinations for reoccurring indications may be performed as noted.  Right Venous Findings:  +---------+---------------+---------+-----------+----------+-------+          CompressibilityPhasicitySpontaneityPropertiesSummary +---------+---------------+---------+-----------+----------+-------+ CFV      Full           Yes      Yes                          +---------+---------------+---------+-----------+----------+-------+ SFJ      Full                                                 +---------+---------------+---------+-----------+----------+-------+  FV Prox  Full                                                 +---------+---------------+---------+-----------+----------+-------+ FV Mid   Full                                                 +---------+---------------+---------+-----------+----------+-------+ FV DistalFull                                                 +---------+---------------+---------+-----------+----------+-------+ PFV      Full                                                 +---------+---------------+---------+-----------+----------+-------+ POP      Full           Yes      Yes                          +---------+---------------+---------+-----------+----------+-------+ PTV      Full                                                 +---------+---------------+---------+-----------+----------+-------+ PERO     Full                                                 +---------+---------------+---------+-----------+----------+-------+  Left Venous Findings: +---------+---------------+---------+-----------+----------+-------+          CompressibilityPhasicitySpontaneityPropertiesSummary +---------+---------------+---------+-----------+----------+-------+ CFV      Full           Yes      Yes                          +---------+---------------+---------+-----------+----------+-------+ SFJ      Full                                                  +---------+---------------+---------+-----------+----------+-------+ FV Prox  Full                                                 +---------+---------------+---------+-----------+----------+-------+ FV Mid   Full                                                 +---------+---------------+---------+-----------+----------+-------+  FV DistalFull                                                 +---------+---------------+---------+-----------+----------+-------+ PFV      Full                                                 +---------+---------------+---------+-----------+----------+-------+ POP      Full           Yes      Yes                          +---------+---------------+---------+-----------+----------+-------+ PTV      Full                                                 +---------+---------------+---------+-----------+----------+-------+ PERO     Full                                                 +---------+---------------+---------+-----------+----------+-------+    Summary: Right: There is no evidence of deep vein thrombosis in the lower extremity. Sluggish flow noted throughout Left: There is no evidence of deep vein thrombosis in the lower extremity. Sluggish flow noted throughout  *See table(s) above for measurements and observations. Electronically signed by Servando Snare MD on 03/09/2018 at 8:52:35 PM.    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 Other possibilities would would be extragonadal germ cell tumor, bulky Hodgkin's lymphoma etc. Elevated LDH level consistent with a high-grade lymphoma-like process CBC not markedly abnormal at this time.  No peripheral blood leukocytosis/lymphocytosis at this time. #2 SVC compression due to mediastinal mass without overt SVC syndrome clinical symptoms #3 small acute distal left  upper lobe pulmonary embolus -on IV heparin #4 left-sided pleural effusion and left-sided lung atelectasis due to airway compression from mediastinal mass #5 significant weight loss of 20 pounds likely due to malignancy #6 drenching night sweats likely has constitutional symptoms from his mediastinal malignancy. Plan -patient notes improvement in breathing and appetite with steroids -continue  Dexamethasone 20mg  daily to complete 4 doses -scheduled for surgical biopsy with CT Sx tomorrow for tissue diagnosis -continue allopurinol 100mg  po BID -reviewed CTA images with patient and parents -needs CT abd/pelvis and then outpatient PET/CT when possible. -will be able to define definitive treatment once tissue diagnosis available. -will continue to followup.  I spent 25 minutes counseling the patient face to face. The total time spent in the appointment was 35 minutes and more than 50% was on counseling and direct patient cares.    Sullivan Lone MD [place] AAHIVMS [facility] Hematology/Oncology Physician [facility]  (Office):       [phone] (Work cell):  [phone] (Fax):           [phone]

## 2018-03-10 NOTE — Anesthesia Postprocedure Evaluation (Signed)
Anesthesia Post Note  Patient: Nomar Broad  Procedure(s) Performed: VIDEO BRONCHOSCOPY (Left Bronchus) MEDIASTINOSCOPY (N/A Chest) VIDEO ASSISTED THORACOSCOPY (VATS)/ BIOSPY ANTERIOR APPROACH (Left Chest)     Patient location during evaluation: PACU Anesthesia Type: General Level of consciousness: oriented, patient cooperative and sedated Pain management: pain level controlled Vital Signs Assessment: post-procedure vital signs reviewed and stable Respiratory status: spontaneous breathing, nonlabored ventilation, respiratory function stable and patient connected to nasal cannula oxygen Cardiovascular status: blood pressure returned to baseline and stable Postop Assessment: no apparent nausea or vomiting Anesthetic complications: no    Last Vitals:  Vitals:   03/10/18 1924 03/10/18 1939  BP: 118/71 118/68  Pulse: 88 87  Resp: 18 18  Temp:  36.5 C  SpO2: 97% 98%    Last Pain:  Vitals:   03/10/18 1930  TempSrc:   PainSc: Asleep                 Valaree Fresquez,E. Valiant Dills

## 2018-03-10 NOTE — Brief Op Note (Addendum)
03/07/2018 - 03/10/2018  5:07 PM  PATIENT:  Nicholas Moreno  21 y.o. male  PRE-OPERATIVE DIAGNOSIS:  MEDIASTIONAL MASS  POST-OPERATIVE DIAGNOSIS:  MEDIASTIONAL MASS  PROCEDURE:  VIDEO BRONCHOSCOPY (Left), MEDIASTINOSCOPY, CHAMBERLAIN PROCEDURE (ANTERIOR MEDIASTINOTOMY)  SURGEON:  Surgeon(s) and Role:    Ivin Poot, MD - Primary  PHYSICIAN ASSISTANT: Lars Pinks PA-C  ANESTHESIA:   general  EBL:  Per anesthesia record   BLOOD ADMINISTERED:none  DRAINS: 37 French chest tube placed in the left pleural space   SPECIMEN:  Source of Specimen:  [place], biopsies   Frozen section showed some round cells. Await final pathology  DISPOSITION OF SPECIMEN:  Pathology and culture  COUNTS CORRECT:  YES  DICTATION: .Dragon Dictation  PLAN OF CARE: Admit to inpatient   PATIENT DISPOSITION:  PACU - hemodynamically stable.   Delay start of Pharmacological VTE agent (>24hrs) due to surgical blood loss or risk of bleeding: yes

## 2018-03-10 NOTE — Transfer of Care (Signed)
Immediate Anesthesia Transfer of Care Note  Patient: Nicholas Moreno  Procedure(s) Performed: VIDEO BRONCHOSCOPY (Left Bronchus) MEDIASTINOSCOPY (N/A Chest) VIDEO ASSISTED THORACOSCOPY (VATS)/ BIOSPY ANTERIOR APPROACH (Left Chest)  Patient Location: PACU  Anesthesia Type:General  Level of Consciousness: awake, alert , oriented and patient cooperative  Airway & Oxygen Therapy: Patient Spontanous Breathing and Patient connected to nasal cannula oxygen  Post-op Assessment: Report given to RN, Post -op Vital signs reviewed and stable and Patient moving all extremities  Post vital signs: Reviewed and stable  Last Vitals:  Vitals Value Taken Time  BP 122/89 03/10/2018  6:39 PM  Temp    Pulse 115 03/10/2018  6:43 PM  Resp 23 03/10/2018  6:43 PM  SpO2 95 % 03/10/2018  6:43 PM  Vitals shown include unvalidated device data.  Last Pain:  Vitals:   03/10/18 1153  TempSrc: Oral  PainSc: 0-No pain      Patients Stated Pain Goal: 0 (15/17/61 6073)  Complications: No apparent anesthesia complications

## 2018-03-10 NOTE — Anesthesia Procedure Notes (Signed)
Arterial Line Insertion Start/End12/05/2017 1:40 PM, 03/10/2018 1:45 PM Performed by: Catalina Gravel, MD, anesthesiologist  Patient location: Pre-op. Preanesthetic checklist: patient identified, IV checked, site marked, risks and benefits discussed, surgical consent, monitors and equipment checked, pre-op evaluation, timeout performed and anesthesia consent Lidocaine 1% used for infiltration Right, radial was placed Catheter size: 20 Fr Hand hygiene performed  and maximum sterile barriers used   Attempts: 1 Procedure performed using ultrasound guided technique. Ultrasound Notes:anatomy identified, needle tip was noted to be adjacent to the nerve/plexus identified, no ultrasound evidence of intravascular and/or intraneural injection and image(s) printed for medical record Following insertion, dressing applied and Biopatch. Post procedure assessment: normal and unchanged  Patient tolerated the procedure well with no immediate complications. Additional procedure comments: Attempt x2 by CRNA. Attempt x1 by MDA using US guidance.Marland Kitchen

## 2018-03-10 NOTE — Anesthesia Preprocedure Evaluation (Addendum)
Anesthesia Evaluation  Patient identified by MRN, date of birth, ID band Patient awake    Reviewed: Allergy & Precautions, NPO status , Patient's Chart, lab work & pertinent test results  Airway Mallampati: II  TM Distance: >3 FB Neck ROM: Full    Dental  (+) Teeth Intact, Dental Advisory Given   Pulmonary shortness of breath, at rest and lying, asthma ,  left pleural effusion and left pulmonary embolism   Pulmonary exam normal breath sounds clear to auscultation       Cardiovascular Normal cardiovascular exam Rhythm:Regular Rate:Normal  Mediastinal mass    Neuro/Psych negative neurological ROS     GI/Hepatic negative GI ROS, Neg liver ROS,   Endo/Other  negative endocrine ROS  Renal/GU negative Renal ROS     Musculoskeletal negative musculoskeletal ROS (+)   Abdominal   Peds  Hematology  (+) Blood dyscrasia, anemia ,   Anesthesia Other Findings Day of surgery medications reviewed with the patient.  Reproductive/Obstetrics                            Anesthesia Physical Anesthesia Plan  ASA: IV  Anesthesia Plan: General   Post-op Pain Management:    Induction: Intravenous  PONV Risk Score and Plan: 2 and Midazolam, Ondansetron and Dexamethasone  Airway Management Planned: Oral ETT  Additional Equipment: Arterial line, CVP and Ultrasound Guidance Line Placement  Intra-op Plan:   Post-operative Plan: Possible Post-op intubation/ventilation  Informed Consent: I have reviewed the patients History and Physical, chart, labs and discussed the procedure including the risks, benefits and alternatives for the proposed anesthesia with the patient or authorized representative who has indicated his/her understanding and acceptance.   Dental advisory given  Plan Discussed with: CRNA  Anesthesia Plan Comments:         Anesthesia Quick Evaluation

## 2018-03-10 NOTE — Progress Notes (Signed)
Pre Procedure note for inpatients:   Nicholas Moreno has been scheduled for Procedure(s): VIDEO BRONCHOSCOPY (Left) MEDIASTINOSCOPY (Left) VIDEO ASSISTED THORACOSCOPY (VATS)/ BIOSPY (Left) today. The various methods of treatment have been discussed with the patient. After consideration of the risks, benefits and treatment options the patient has consented to the planned procedure.   The patient has been seen and labs reviewed. There are no changes in the patient's condition to prevent proceeding with the planned procedure today.  Recent labs:  Lab Results  Component Value Date   WBC 16.3 (H) 03/10/2018   HGB 12.8 (L) 03/10/2018   HCT 40.2 03/10/2018   PLT 550 (H) 03/10/2018   GLUCOSE 107 (H) 03/10/2018   CHOL 123 03/08/2018   ALT 26 03/10/2018   AST 20 03/10/2018   NA 139 03/10/2018   K 4.0 03/10/2018   CL 102 03/10/2018   CREATININE 0.67 03/10/2018   BUN 9 03/10/2018   CO2 31 03/10/2018   INR 1.09 03/10/2018    Nicholas Childs, MD 03/10/2018 2:11 PM

## 2018-03-10 NOTE — Anesthesia Procedure Notes (Signed)
Procedure Name: Intubation Date/Time: 03/10/2018 3:18 PM Performed by: Myna Bright, CRNA Pre-anesthesia Checklist: Patient identified, Emergency Drugs available, Suction available and Patient being monitored Patient Re-evaluated:Patient Re-evaluated prior to induction Oxygen Delivery Method: Circle system utilized Preoxygenation: Pre-oxygenation with 100% oxygen Induction Type: IV induction Ventilation: Mask ventilation without difficulty Laryngoscope Size: Mac and 4 Grade View: Grade I Tube type: Oral Tube size: 8.5 mm Number of attempts: 1 Airway Equipment and Method: Stylet Placement Confirmation: ETT inserted through vocal cords under direct vision,  positive ETCO2 and breath sounds checked- equal and bilateral Secured at: 21 cm Tube secured with: Tape Dental Injury: Teeth and Oropharynx as per pre-operative assessment

## 2018-03-10 NOTE — Progress Notes (Signed)
PROGRESS NOTE    Nicholas Moreno  LTJ:[id number] DOB: 02-21-1996 DOA: 03/07/2018 PCP: Patient, No Pcp Per   Brief Narrative: Patient is a 22 year old filipino male who presented with SOB.Work up revealed large mediastinal mass ,left pleural effusion and left pulmonary embolism.  Started on heparin drip and underwent left sided thoracentesis by PCCM. Oncology consulted.Plan is mediastinal biopsy today by CT surgery.Features are highly suggestive of Lymphoma.     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  LDH elevated.  Uric acid normal.  CT surgery during biopsy today.  Oncology following. Also started on high-dose dexamethasone. AFP tumor marker pending.  Superior venacava compression from mass: Started on dexamethasone.  Also on Protonix.  Denies any symptoms at present.  Hyperglycemia/leukocytosis: Secondary to steroids.  We will continue to monitor.  Continue sliding his insulin.  Left pleural effusion: Status post thoracentesis.  Monitor removed.  Exudative in nature most likely associated with malignancy.  Single subsegmental pulmonary embolism: Continue heparin drip for now until all procedures are complete.   DVT prophylaxis:heparin IV Code Status: Full Family Communication: Family member present at the bedside Disposition Plan: Home after full work-up   Consultants: Oncology, CT surgery, PCCM  Procedures: Paracentesis  Antimicrobials: None  Subjective: Patient seen and examined the bedside this morning.  Remains hemodynamically stable.  Denies any new complaints.  Comfortable.  Objective: Vitals:   03/10/18 0106 03/10/18 0400 03/10/18 0450 03/10/18 0825  BP: 115/64 (!) 124/103 111/72 112/66  Pulse: 80 (!) 35 65 79  Resp: (!) 28 (!) 21 (!) 22 (!) 23  Temp: 97.7 F (36.5 C) (!) 97.5 F  (36.4 C)  97.7 F (36.5 C)  TempSrc: Oral Oral  Oral  SpO2: 97% 94% 98% 99%  Weight:      Height:        Intake/Output Summary (Last 24 hours) at 03/10/2018 1047 Last data filed at 03/10/2018 0762 Gross per 24 hour  Intake 632.62 ml  Output -  Net 632.62 ml   Filed Weights   03/07/18 1445 03/07/18 2000 03/08/18 0456  Weight: 54.4 kg 52.9 kg 52.9 kg    Examination:  General exam: Appears calm and comfortable ,Not in distress,average built HEENT:PERRL,Oral mucosa moist, Ear/Nose normal on gross exam Respiratory system:  Decreased air entry on the left side, no wheezes or crackles  Cardiovascular system: S1 & S2 heard, RRR. No JVD, murmurs, rubs, gallops or clicks. No pedal edema. Gastrointestinal system: Abdomen is nondistended, soft and nontender. No organomegaly or masses felt. Normal bowel sounds heard. Central nervous system: Alert and oriented. No focal neurological deficits. Extremities: No edema, no clubbing ,no cyanosis, distal peripheral pulses palpable. Skin: No rashes, lesions or ulcers,no icterus ,no pallor MSK: Normal muscle bulk,tone ,power Psychiatry: Judgement and insight appear normal. Mood & affect appropriate.     Data Reviewed: I have personally reviewed following labs and imaging studies  CBC: Recent Labs  Lab 03/07/18 1535 03/08/18 0240 03/09/18 0215 03/10/18 0143 03/10/18 0447  WBC 7.3 8.2 6.8 17.5* 16.3*  NEUTROABS 5.3 6.1 6.1 15.8* 14.7*  HGB 14.1 12.1* 12.2* 12.0* 12.8*  HCT 45.6 38.0* 39.8 38.3* 40.2  MCV 82.2 80.2 80.2 79.3* 79.4*  PLT 319 457* 534* 597* 263*   Basic Metabolic Panel: Recent Labs  Lab 03/07/18 1535 03/09/18 0215 03/10/18 0447  NA 135  133* 139  K 4.2 3.9 4.0  CL 98 98 102  CO2 27 22 31   GLUCOSE 87 213* 107*  BUN 7 8 9   CREATININE 0.96 0.77 0.67  CALCIUM 8.7* 8.7* 8.8*   GFR: Estimated Creatinine Clearance: 109.3 mL/min (by C-G formula based on SCr of 0.67 mg/dL). Liver Function Tests: Recent Labs  Lab  03/07/18 1927 03/08/18 1545 03/09/18 0215 03/10/18 0447  AST 32  --  28 20  ALT 24  --  24 26  ALKPHOS 54  --  54 52  BILITOT 0.3  --  0.4 0.2*  PROT 6.7 6.7 6.0* 5.9*  ALBUMIN 2.9*  --  2.4* 2.6*   No results for input(s): LIPASE, AMYLASE in the last 168 hours. No results for input(s): AMMONIA in the last 168 hours. Coagulation Profile: Recent Labs  Lab 03/10/18 0143  INR 1.09   Cardiac Enzymes: Recent Labs  Lab 03/07/18 1540  TROPONINI <0.03   BNP (last 3 results) No results for input(s): PROBNP in the last 8760 hours. HbA1C: No results for input(s): HGBA1C in the last 72 hours. CBG: Recent Labs  Lab 03/07/18 2001 03/09/18 1642 03/09/18 2126 03/10/18 0742  GLUCAP 90 147* 116* 101*   Lipid Profile: Recent Labs    03/08/18 1545  CHOL 123   Thyroid Function Tests: No results for input(s): TSH, T4TOTAL, FREET4, T3FREE, THYROIDAB in the last 72 hours. Anemia Panel: No results for input(s): VITAMINB12, FOLATE, FERRITIN, TIBC, IRON, RETICCTPCT in the last 72 hours. Sepsis Labs: No results for input(s): PROCALCITON, LATICACIDVEN in the last 168 hours.  Recent Results (from the past 240 hour(s))  MRSA PCR Screening     Status: None   Collection Time: 03/07/18  7:58 PM  Result Value Ref Range Status   MRSA by PCR NEGATIVE NEGATIVE Final    Comment:        The GeneXpert MRSA Assay (FDA approved for NASAL specimens only), is one component of a comprehensive MRSA colonization surveillance program. It is not intended to diagnose MRSA infection nor to guide or monitor treatment for MRSA infections. Performed at [facility], [place] [address], [place], [place] [address]   Body fluid culture     Status: None (Preliminary result)   Collection Time: 03/08/18  3:24 PM  Result Value Ref Range Status   Specimen Description PLEURAL LEFT  Final   Special Requests NONE  Final   Gram Stain   Final    ABUNDANT WBC PRESENT, PREDOMINANTLY MONONUCLEAR NO ORGANISMS  SEEN    Culture   Final    NO GROWTH < 24 HOURS Performed at [facility], [place] [address] [place], [place] [address]    Report Status PENDING  Incomplete  Surgical pcr screen     Status: Abnormal   Collection Time: 03/09/18  1:43 PM  Result Value Ref Range Status   MRSA, PCR NEGATIVE NEGATIVE Final   Staphylococcus aureus POSITIVE (A) NEGATIVE Final    Comment: (NOTE) The Xpert SA Assay (FDA approved for NASAL specimens in patients 80 years of age and older), is one component of a comprehensive surveillance program. It is not intended to diagnose infection nor to guide or monitor treatment.          Radiology Studies: Dg Chest Port 1 View  Result Date: 03/08/2018 CLINICAL DATA:  Status post left thoracentesis EXAM: PORTABLE CHEST 1 VIEW COMPARISON:  Chest CT from 1 day prior FINDINGS: Near complete opacification of the left hemithorax  with asymmetric volume loss in the left hemithorax, not substantially changed. Marked thickening of the upper mediastinum bilaterally, unchanged. Cardiac silhouette is obscured, with the heart appearing normal in size. No pneumothorax. No right pleural effusion. Persistent moderate left pleural effusion. Stable masslike opacity in the medial upper right lung with right upper lobe volume loss. IMPRESSION: 1. No pneumothorax.  Persistent moderate left pleural effusion. 2. Stable marked thickening of the upper mediastinum bilaterally compatible with known infiltrative anterior mediastinal mass. 3. Persistent volume loss in the left hemithorax and right upper lung. Stable near complete left lung opacification and masslike medial right upper lung opacity compatible with a combination of atelectasis and tumor. Electronically Signed   By: Ilona Sorrel M.D.   On: 03/08/2018 15:35   Vas [place] Lower Extremity Venous (dvt)  Result Date: 03/09/2018  Lower Venous Study Risk Factors: Confirmed PE New, large anterior mediastinal mass (probable lymphoma) extending  from right medial lung to left lateral lung. Comparison Study: No prior study on file for comparison Performing Technologist: Sharion Dove RVS  Examination Guidelines: A complete evaluation includes B-mode imaging, spectral Doppler, color Doppler, and power Doppler as needed of all accessible portions of each vessel. Bilateral testing is considered an integral part of a complete examination. Limited examinations for reoccurring indications may be performed as noted.  Right Venous Findings: +---------+---------------+---------+-----------+----------+-------+          CompressibilityPhasicitySpontaneityPropertiesSummary +---------+---------------+---------+-----------+----------+-------+ CFV      Full           Yes      Yes                          +---------+---------------+---------+-----------+----------+-------+ SFJ      Full                                                 +---------+---------------+---------+-----------+----------+-------+ FV Prox  Full                                                 +---------+---------------+---------+-----------+----------+-------+ FV Mid   Full                                                 +---------+---------------+---------+-----------+----------+-------+ FV DistalFull                                                 +---------+---------------+---------+-----------+----------+-------+ PFV      Full                                                 +---------+---------------+---------+-----------+----------+-------+ POP      Full           Yes      Yes                          +---------+---------------+---------+-----------+----------+-------+  PTV      Full                                                 +---------+---------------+---------+-----------+----------+-------+ PERO     Full                                                 +---------+---------------+---------+-----------+----------+-------+   Left Venous Findings: +---------+---------------+---------+-----------+----------+-------+          CompressibilityPhasicitySpontaneityPropertiesSummary +---------+---------------+---------+-----------+----------+-------+ CFV      Full           Yes      Yes                          +---------+---------------+---------+-----------+----------+-------+ SFJ      Full                                                 +---------+---------------+---------+-----------+----------+-------+ FV Prox  Full                                                 +---------+---------------+---------+-----------+----------+-------+ FV Mid   Full                                                 +---------+---------------+---------+-----------+----------+-------+ FV DistalFull                                                 +---------+---------------+---------+-----------+----------+-------+ PFV      Full                                                 +---------+---------------+---------+-----------+----------+-------+ POP      Full           Yes      Yes                          +---------+---------------+---------+-----------+----------+-------+ PTV      Full                                                 +---------+---------------+---------+-----------+----------+-------+ PERO     Full                                                 +---------+---------------+---------+-----------+----------+-------+  Summary: Right: There is no evidence of deep vein thrombosis in the lower extremity. Sluggish flow noted throughout Left: There is no evidence of deep vein thrombosis in the lower extremity. Sluggish flow noted throughout  *See table(s) above for measurements and observations. Electronically signed by Servando Snare MD on 03/09/2018 at 8:52:35 PM.    Final         Scheduled Meds: . allopurinol  100 mg Oral BID  . dexamethasone  20 mg Intravenous Daily  . insulin  aspart  0-9 Units Subcutaneous TID WC  . pantoprazole  40 mg Oral Daily   Continuous Infusions: . sodium chloride    .  ceFAZolin (ANCEF) IV       LOS: 3 days    Time spent: 25 mins.More than 50% of that time was spent in counseling and/or coordination of care.      Shelly Coss, MD [facility] Pager [phone]  If 7PM-7AM, please contact night-coverage [email or web address] Password [facility] 03/10/2018, 10:47 AM

## 2018-03-10 NOTE — Plan of Care (Signed)
  Problem: Clinical Measurements: Goal: Ability to maintain clinical measurements within normal limits will improve Outcome: Progressing Goal: Will remain free from infection Outcome: Progressing Goal: Diagnostic test results will improve Outcome: Progressing Goal: Respiratory complications will improve Outcome: Progressing Goal: Cardiovascular complication will be avoided Outcome: Progressing   Problem: Activity: Goal: Risk for activity intolerance will decrease Outcome: Progressing   Problem: Nutrition: Goal: Adequate nutrition will be maintained Outcome: Progressing   Problem: Coping: Goal: Level of anxiety will decrease Outcome: Progressing   Problem: Elimination: Goal: Will not experience complications related to bowel motility Outcome: Progressing Goal: Will not experience complications related to urinary retention Outcome: Progressing   Problem: Skin Integrity: Goal: Risk for impaired skin integrity will decrease Outcome: Progressing

## 2018-03-10 NOTE — Progress Notes (Addendum)
ANTICOAGULATION CONSULT NOTE - Follow Up Consult  Pharmacy Consult for heparin Indication: pulmonary embolus   HEPARIN DW (KG): 52.9   Labs: Recent Labs    03/07/18 1535 03/07/18 1540 03/08/18 0240  03/09/18 0215 03/09/18 0728 03/09/18 1627 03/10/18 0143  HGB 14.1  --  12.1*  --  12.2*  --   --  12.0*  HCT 45.6  --  38.0*  --  39.8  --   --  38.3*  PLT 319  --  457*  --  534*  --   --  597*  LABPROT  --   --   --   --   --   --   --  14.0  INR  --   --   --   --   --   --   --  1.09  HEPARINUNFRC  --   --  <0.10*   < >  --  0.30 0.24* 0.53  CREATININE 0.96  --   --   --  0.77  --   --   --   TROPONINI  --  <0.03  --   --   --   --   --   --    < > = values in this interval not displayed.    Assessment: 22yo male restarted on heparin for new PE yesterday after thoracentesis. Heparin level therapeutic  Goal of Therapy:  Heparin level 0.3-0.7 units/ml   Plan:  Continue heparin infusion at 1500 units/hr  F/u restart after surgery  Thanks for allowing pharmacy to be a part of this patient's care.  Excell Seltzer, PharmD Clinical Pharmacist

## 2018-03-11 ENCOUNTER — Inpatient Hospital Stay (HOSPITAL_COMMUNITY): Payer: BLUE CROSS/BLUE SHIELD

## 2018-03-11 ENCOUNTER — Encounter (HOSPITAL_COMMUNITY): Payer: Self-pay | Admitting: Cardiothoracic Surgery

## 2018-03-11 ENCOUNTER — Encounter: Payer: Self-pay | Admitting: *Deleted

## 2018-03-11 LAB — TYPE AND SCREEN
ABO/RH(D): A POS
Antibody Screen: NEGATIVE
UNIT DIVISION: 0
Unit division: 0
Unit division: 0
Unit division: 0
Unit division: 0

## 2018-03-11 LAB — BPAM RBC
Blood Product Expiration Date: 201912232359
Blood Product Expiration Date: 201912252359
Blood Product Expiration Date: 201912262359
Blood Product Expiration Date: 201912262359
Blood Product Expiration Date: 201912262359
ISSUE DATE / TIME: 201912021607
ISSUE DATE / TIME: 201912021607
Unit Type and Rh: 6200
Unit Type and Rh: 6200
Unit Type and Rh: 6200
Unit Type and Rh: 6200
Unit Type and Rh: 6200

## 2018-03-11 LAB — GLUCOSE, CAPILLARY
Glucose-Capillary: 106 mg/dL — ABNORMAL HIGH (ref 70–99)
Glucose-Capillary: 118 mg/dL — ABNORMAL HIGH (ref 70–99)
Glucose-Capillary: 114 mg/dL — ABNORMAL HIGH (ref 70–99)
Glucose-Capillary: 117 mg/dL — ABNORMAL HIGH (ref 70–99)
Glucose-Capillary: 122 mg/dL — ABNORMAL HIGH (ref 70–99)

## 2018-03-11 LAB — BLOOD GAS, ARTERIAL
Acid-Base Excess: 1.6 mmol/L (ref 0.0–2.0)
Bicarbonate: 26.4 mmol/L (ref 20.0–28.0)
O2 Content: 2 L/min
O2 Saturation: 98.1 %
PATIENT TEMPERATURE: 98.6
pCO2 arterial: 47.3 mmHg (ref 32.0–48.0)
pH, Arterial: 7.365 (ref 7.350–7.450)
pO2, Arterial: 125 mmHg — ABNORMAL HIGH (ref 83.0–108.0)

## 2018-03-11 LAB — CBC
HCT: 37.2 % — ABNORMAL LOW (ref 39.0–52.0)
Hemoglobin: 11.3 g/dL — ABNORMAL LOW (ref 13.0–17.0)
MCH: 24.8 pg — AB (ref 26.0–34.0)
MCHC: 30.4 g/dL (ref 30.0–36.0)
MCV: 81.6 fL (ref 80.0–100.0)
Platelets: 471 10*3/uL — ABNORMAL HIGH (ref 150–400)
RBC: 4.56 MIL/uL (ref 4.22–5.81)
RDW: 12.4 % (ref 11.5–15.5)
WBC: 11.5 10*3/uL — ABNORMAL HIGH (ref 4.0–10.5)
nRBC: 0 % (ref 0.0–0.2)

## 2018-03-11 LAB — BASIC METABOLIC PANEL
Anion gap: 10 (ref 5–15)
BUN: 16 mg/dL (ref 6–20)
CO2: 24 mmol/L (ref 22–32)
Calcium: 8.6 mg/dL — ABNORMAL LOW (ref 8.9–10.3)
Chloride: 103 mmol/L (ref 98–111)
Creatinine, Ser: 0.81 mg/dL (ref 0.61–1.24)
GFR calc Af Amer: >60 mL/min (ref >60)
Glucose, Bld: 117 mg/dL — ABNORMAL HIGH (ref 70–99)
Potassium: 4.3 mmol/L (ref 3.5–5.1)
Sodium: 137 mmol/L (ref 135–145)

## 2018-03-11 LAB — RHEUMATOID FACTORS, FLUID: RHEUMATOID ARTHRITIS, QN/FLUID: NEGATIVE

## 2018-03-11 LAB — ACID FAST SMEAR (AFB, MYCOBACTERIA): Acid Fast Smear: NEGATIVE

## 2018-03-11 LAB — BODY FLUID CULTURE: CULTURE: NO GROWTH

## 2018-03-11 LAB — CHOLESTEROL, BODY FLUID: Cholesterol, Fluid: 41 mg/dL

## 2018-03-11 MED ORDER — ENSURE ENLIVE PO LIQD
237.0000 mL | Freq: Two times a day (BID) | ORAL | Status: DC
  Administered 2018-03-11 – 2018-03-18 (×7): 237 mL via ORAL

## 2018-03-11 MED ORDER — POLYETHYLENE GLYCOL 3350 17 G PO PACK
17.0000 g | PACK | Freq: Every day | ORAL | Status: DC
  Administered 2018-03-11 – 2018-03-19 (×7): 17 g via ORAL
  Filled 2018-03-11 (×9): qty 1

## 2018-03-11 MED ORDER — ADULT MULTIVITAMIN W/MINERALS CH
1.0000 | ORAL_TABLET | Freq: Every day | ORAL | Status: DC
  Administered 2018-03-11 – 2018-03-19 (×9): 1 via ORAL
  Filled 2018-03-11 (×10): qty 1

## 2018-03-11 MED ORDER — KETOROLAC TROMETHAMINE 15 MG/ML IJ SOLN
15.0000 mg | Freq: Four times a day (QID) | INTRAMUSCULAR | Status: AC
  Administered 2018-03-11 – 2018-03-12 (×4): 15 mg via INTRAVENOUS
  Filled 2018-03-11 (×5): qty 1

## 2018-03-11 MED ORDER — ENOXAPARIN SODIUM 40 MG/0.4ML ~~LOC~~ SOLN
40.0000 mg | SUBCUTANEOUS | Status: DC
  Administered 2018-03-11: 40 mg via SUBCUTANEOUS
  Filled 2018-03-11: qty 0.4

## 2018-03-11 MED ORDER — CHLORHEXIDINE GLUCONATE CLOTH 2 % EX PADS
6.0000 | MEDICATED_PAD | Freq: Every day | CUTANEOUS | Status: AC
  Administered 2018-03-11 – 2018-03-15 (×5): 6 via TOPICAL

## 2018-03-11 MED ORDER — MUPIROCIN 2 % EX OINT
1.0000 "application " | TOPICAL_OINTMENT | Freq: Two times a day (BID) | CUTANEOUS | Status: AC
  Administered 2018-03-11 – 2018-03-15 (×10): 1 via NASAL
  Filled 2018-03-11 (×4): qty 22

## 2018-03-11 NOTE — Progress Notes (Addendum)
T[facility] DAILY ICU PROGRESS NOTE                   [place]            ,[place] [address]          [phone]   1 Day Post-Op Procedure(s) (LRB): VIDEO BRONCHOSCOPY (Left) MEDIASTINOSCOPY (N/A) VIDEO ASSISTED THORACOSCOPY (VATS)/ BIOSPY ANTERIOR APPROACH (Left)  Total Length of Stay:  LOS: 4 days   Subjective: Patient sitting in chair and has incisional pain this am. Father at bedside.  Objective: Vital signs in last 24 hours: Temp:  [97.4 F (36.3 C)-97.9 F (36.6 C)] 97.9 F (36.6 C) (12/03 0700) Pulse Rate:  [50-109] 50 (12/03 0600) Cardiac Rhythm: Normal sinus rhythm (12/03 0400) Resp:  [14-26] 16 (12/03 0600) BP: (96-138)/(53-89) 96/54 (12/03 0600) SpO2:  [95 %-99 %] 99 % (12/03 0600) Arterial Line BP: (72-156)/(54-85) 74/65 (12/03 0600) Weight:  [52.9 kg] 52.9 kg (12/02 1344)  Filed Weights   03/07/18 2000 03/08/18 0456 03/10/18 1344  Weight: 52.9 kg 52.9 kg 52.9 kg      Intake/Output from previous day: 12/02 0701 - 12/03 0700 In: 3411.6 [P.O.:360; I.V.:2951.6; IV Piggyback:100] Out: 950 [Urine:600; Blood:75; Chest Tube:275]  Intake/Output this shift: Total I/O In: -  Out: 50 [Chest Tube:50]  Current Meds: Scheduled Meds: . acetaminophen  1,000 mg Oral Q6H   Or  . acetaminophen (TYLENOL) oral liquid 160 mg/5 mL  1,000 mg Oral Q6H  . allopurinol  100 mg Oral BID  . bisacodyl  10 mg Oral Daily  . dexamethasone  20 mg Intravenous Daily  . insulin aspart  0-24 Units Subcutaneous Q6H  . pantoprazole  40 mg Oral Daily   Continuous Infusions: . 0.9 % NaCl with KCl 20 mEq / L 100 mL/hr at 03/11/18 0600  . potassium chloride     PRN Meds:.fentaNYL (SUBLIMAZE) injection, Influenza vac split quadrivalent PF, ondansetron (ZOFRAN) IV, potassium chloride, traMADol  General appearance: alert, cooperative and no distress Neurologic: intact Heart: RRR Lungs: Clear on the right and diminshed on the left Abdomen: Soft, non tender, spordic  bowel sounds Extremities: No LE edema Wound: Dressings are clean and dry  Lab Results: CBC: Recent Labs    03/10/18 0447 03/11/18 0543  WBC 16.3* 11.5*  HGB 12.8* 11.3*  HCT 40.2 37.2*  PLT 550* 471*   BMET:  Recent Labs    03/10/18 0447 03/11/18 0543  NA 139 137  K 4.0 4.3  CL 102 103  CO2 31 24  GLUCOSE 107* 117*  BUN 9 16  CREATININE 0.67 0.81  CALCIUM 8.8* 8.6*    CMET: Lab Results  Component Value Date   WBC 11.5 (H) 03/11/2018   HGB 11.3 (L) 03/11/2018   HCT 37.2 (L) 03/11/2018   PLT 471 (H) 03/11/2018   GLUCOSE 117 (H) 03/11/2018   CHOL 123 03/08/2018   ALT 26 03/10/2018   AST 20 03/10/2018   NA 137 03/11/2018   K 4.3 03/11/2018   CL 103 03/11/2018   CREATININE 0.81 03/11/2018   BUN 16 03/11/2018   CO2 24 03/11/2018   INR 1.09 03/10/2018      PT/INR:  Recent Labs    03/10/18 0143  LABPROT 14.0  INR 1.09   Radiology: Dg Chest Port 1 View  Result Date: 03/10/2018 CLINICAL DATA:  Post left VATS.  Evaluate pneumothorax.  Chest tube. EXAM: PORTABLE CHEST 1 VIEW COMPARISON:  03/08/2018 FINDINGS: Interval placement of left chest  tube. Continued large left pleural effusion and diffuse airspace disease throughout the left lung. No pneumothorax. Large rounded masslike opacity in the medial right upper lobe is stable. Right central line is in place with the tip in the SVC. No pneumothorax. IMPRESSION: Interval placement of left chest tube and right central line. No pneumothorax. Continued large left pleural effusion and near complete opacification of the left lung. Continued medial right upper lobe mass. Electronically Signed   By: Rolm Baptise M.D.   On: 03/10/2018 20:53     Assessment/Plan: S/P Procedure(s) (LRB): VIDEO BRONCHOSCOPY (Left) MEDIASTINOSCOPY (N/A) VIDEO ASSISTED THORACOSCOPY (VATS)/ BIOSPY ANTERIOR APPROACH (Left)  1. CV-SR in the 70's this am. 2. Pulmonary-On room air. Chest tube with 275 cc of output. Will leave chest tube for now.  CXR this am appears to show  left pleural effusion and near opacification of the left lung (mostly mass). Encourage incentive spirometer. 3. ABL anemia-H and H 11.3 and 37.2 this am 4. Small PE-Heparin drip to be resumed at Dr. Lucianne Lei Trigt's discretion 5. Remove a line, decrease IVF, stop accu checks 6. 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 Tharon Aquas Trigt III 03/11/2018

## 2018-03-11 NOTE — Progress Notes (Signed)
Patient transferred to 2C04, patient ambulated to new room using a walker, tolerated ambulation well.  Mom, dada and siblings at bedside, all updated on care plan.  All belongings including cell phone moved with patient. Receiving RN Amina at bedside.

## 2018-03-11 NOTE — Progress Notes (Signed)
Patient ID: Nicholas Moreno, male   DOB: 05/18/1995, 22 y.o.   MRN: [id number] T[facility] evening Rounds:  Hemodynamically stable  Chest tube output low.  Sitting up in chair.

## 2018-03-11 NOTE — Progress Notes (Signed)
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  GOAL:   Patient will meet greater than or equal to 90% of their needs  MONITOR:   PO intake, Supplement acceptance, Skin, Labs, Weight trends, I & O's  REASON FOR ASSESSMENT:   Malnutrition Screening Tool    ASSESSMENT:   22 year old male who presented to the ED on 11/29 with SOB and weak cough x 2 months. CT showed a large, necrotic mediastinal mass with adjacent atelectasis, left pleural effusion, and small RUL PA. Pt admitted for dyspnea in setting of large mediastinal mass and PE. No significant PMH.  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  Spoke with pt at bedside. Pt reports improved appetite since admission and attributes this to taking steroids. Pt states that he didn't eat anything for breakfast but is going to try to have some at lunch. Pt states that he wants to start with a little bit of food and increase from there if he is not experiencing nausea.  Pt endorses recent weight loss and reports his UBW as 135 lbs. Pt states that he last weighed this 6 months ago. Current weight is 116.62 lbs. This is an 18.4 lb weight loss (13.6% weight loss) which is significant for timeframe.  Pt states that his appetite decreased when he  started feeling sick 2-3 months ago. Pt shares that he had low energy and did not always feel like going to the dining hall to eat. Pt states that during this time, he ate 2 meals daily. A meal might include yogurt and fruit or soup.  Pt states that he has tried Ensure before and is willing to drink it during admission. He would prefer vanilla flavor. Will also order MVI with minerals daily.  Meal Completion: 40-90% x last 4 meals  Medications reviewed and include: Dulcolax, Protonix, Miralax, NaCl with KCL 20 mEq/L at 10 ml/hr  Labs reviewed. CBG's: 117, 114, 111, 118, 106, 94 x 24 hours  UOP: 600 ml x 24 hours recorded Chest tube: 275 ml x 24 hours I/O's: +5.9 L since admit  NUTRITION - FOCUSED PHYSICAL EXAM:    Most Recent Value  Orbital Region  Mild depletion  Upper Arm Region  Moderate depletion  Thoracic and Lumbar Region  Unable to assess [pain]  Buccal Region  Mild depletion  Temple Region  Mild depletion  Clavicle Bone Region  Moderate depletion  Clavicle and Acromion Bone Region  Moderate depletion  Scapular Bone Region  Moderate depletion  Dorsal Hand  No depletion  Patellar Region  Mild depletion  Anterior Thigh Region  Mild depletion  Posterior Calf Region  No depletion  Edema (RD Assessment)  None  Hair  Reviewed  Eyes  Reviewed  Mouth  Reviewed  Skin  Reviewed  Nails  Reviewed       Diet Order:   Diet  Order            Diet regular Room service appropriate? Yes; Fluid consistency: Thin  Diet effective now              EDUCATION NEEDS:   Education needs have been addressed  Skin:  Skin Assessment: Skin Integrity Issues: Incisions: neck, left chest  Last BM:  12/1  Height:   Ht Readings from Last 1 Encounters:  03/10/18 5' 5.98" (1.676 m)    Weight:   Wt Readings from Last 1 Encounters:  03/10/18 52.9 kg    Ideal Body Weight:  64.5 kg  BMI:  Body mass index is 18.83 kg/m.  Estimated Nutritional Needs:   Kcal:   1850-2050  Protein:  75-90 grams  Fluid:  >/= 1.9 L    Gaynell Face, MS, RD, LDN Inpatient Clinical Dietitian Pager: [phone] Weekend/After Hours: [phone]

## 2018-03-11 NOTE — Plan of Care (Signed)
  Problem: Clinical Measurements: Goal: Ability to maintain clinical measurements within normal limits will improve Outcome: Progressing Goal: Will remain free from infection Outcome: Progressing Goal: Diagnostic test results will improve Outcome: Progressing Goal: Respiratory complications will improve Outcome: Progressing Goal: Cardiovascular complication will be avoided Outcome: Progressing   Problem: Activity: Goal: Risk for activity intolerance will decrease Outcome: Progressing   Problem: Nutrition: Goal: Adequate nutrition will be maintained Outcome: Progressing   Problem: Coping: Goal: Level of anxiety will decrease Outcome: Progressing   Problem: Elimination: Goal: Will not experience complications related to bowel motility Outcome: Progressing Goal: Will not experience complications related to urinary retention Outcome: Progressing   Problem: Skin Integrity: Goal: Risk for impaired skin integrity will decrease Outcome: Progressing   Problem: Education: Goal: Knowledge of disease or condition will improve Outcome: Progressing Goal: Knowledge of the prescribed therapeutic regimen will improve Outcome: Progressing   Problem: Activity: Goal: Risk for activity intolerance will decrease Outcome: Progressing   Problem: Cardiac: Goal: Will achieve and/or maintain hemodynamic stability Outcome: Progressing   Problem: Clinical Measurements: Goal: Postoperative complications will be avoided or minimized Outcome: Progressing   Problem: Respiratory: Goal: Respiratory status will improve Outcome: Progressing   Problem: Pain Management: Goal: Pain level will decrease Outcome: Progressing   Problem: Skin Integrity: Goal: Wound healing without signs and symptoms infection will improve Outcome: Progressing

## 2018-03-11 NOTE — Progress Notes (Signed)
PROGRESS NOTE    Nicholas Moreno  WRU:[id number] DOB: January 25, 1996 DOA: 03/07/2018 PCP: Patient, No Pcp Per   Brief Narrative: Patient is a 22 year old filipino male who presented with SOB.Work up revealed large mediastinal mass ,left pleural effusion and left pulmonary embolism.  Started on heparin drip and underwent left sided thoracentesis by PCCM. Oncology consulted.Underwent mediastinal mass biopsy  by CT surgery through VATS procedure.S/P chest tube placement.   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  LDH elevated.  Uric acid normal.  He underwent VATS procedure with biopsy the mediastinal mass and also biopsy of the right lowerr lobe and left upper lobe masses.S/P chest tube placement.  Oncology following. Also started on high-dose dexamethasone. AFP tumor marker negative.Will follow-up histopathology report Chest Xray on 12/2 showed continued large left pleural effusion and near complete opacification of the left lung. Continued medial right upper lobe mass. Continue pain management.  Superior venacava compression from mass: Started on dexamethasone.  Also on Protonix.   Hyperglycemia/leukocytosis: Secondary to steroids.  We will continue to monitor.  Continue sliding his insulin.  Left pleural effusion: Status post thoracentesis.  Fluid exudative in nature most likely associated with malignancy.  Single subsegmental pulmonary embolism: Continue heparin drip for now until all procedures are complete.   DVT prophylaxis:heparin IV Code Status: Full Family Communication: Family members present at the bedside Disposition Plan: Home after full work-up,chest tube removal   Consultants: Oncology, CT surgery, PCCM  Procedures: Paracentesis,VATS procedure.chest tube placment  Antimicrobials:  None  Subjective: Patient seen and examined the bedside this morning.  Remains hemodynamically stable.   Comfortable.Denied any chest pain or shortness of breath.  Objective: Vitals:   03/11/18 0500 03/11/18 0600 03/11/18 0700 03/11/18 0800  BP: (!) 96/59 (!) 96/54  111/63  Pulse: 63 (!) 50  65  Resp: 16 16  (!) 22  Temp:   97.9 F (36.6 C)   TempSrc:   Oral   SpO2: 99% 99%  96%  Weight:      Height:        Intake/Output Summary (Last 24 hours) at 03/11/2018 9147 Last data filed at 03/11/2018 0800 Gross per 24 hour  Intake 3703.89 ml  Output 1000 ml  Net 2703.89 ml   Filed Weights   03/07/18 2000 03/08/18 0456 03/10/18 1344  Weight: 52.9 kg 52.9 kg 52.9 kg    Examination:  General exam: Not in distress,average built HEENT:PERRL,Oral mucosa moist, Ear/Nose normal on gross exam,right sided central line on the neck Respiratory system:  Decreased air entry on the left side, chest tube on left,surgical wounds on the chest Cardiovascular system: S1 & S2 heard, RRR. No JVD, murmurs, rubs, gallops or clicks. No pedal edema. Gastrointestinal system: Abdomen is nondistended, soft and nontender. No organomegaly or masses felt. Normal bowel sounds heard. Central nervous system: Alert and oriented. No focal neurological deficits. Extremities: No edema, no clubbing ,no cyanosis, distal peripheral pulses palpable. Skin: No rashes, lesions or ulcers,no icterus ,no pallor MSK: Normal muscle bulk,tone ,power Psychiatry: Judgement and insight appear normal. Mood & affect appropriate.     Data Reviewed: I have personally reviewed following labs and imaging studies  CBC: Recent Labs  Lab 03/07/18 1535 03/08/18 0240 03/09/18 0215 03/10/18 0143 03/10/18 0447 03/11/18 0543  WBC 7.3 8.2 6.8 17.5* 16.3* 11.5*  NEUTROABS 5.3  6.1 6.1 15.8* 14.7*  --   HGB 14.1 12.1* 12.2* 12.0* 12.8* 11.3*  HCT 45.6 38.0* 39.8 38.3* 40.2 37.2*  MCV 82.2 80.2 80.2 79.3* 79.4* 81.6  PLT 319 457* 534*  597* 550* 254*   Basic Metabolic Panel: Recent Labs  Lab 03/07/18 1535 03/09/18 0215 03/10/18 0447 03/11/18 0543  NA 135 133* 139 137  K 4.2 3.9 4.0 4.3  CL 98 98 102 103  CO2 27 22 31 24   GLUCOSE 87 213* 107* 117*  BUN 7 8 9 16   CREATININE 0.96 0.77 0.67 0.81  CALCIUM 8.7* 8.7* 8.8* 8.6*   GFR: Estimated Creatinine Clearance: 107.9 mL/min (by C-G formula based on SCr of 0.81 mg/dL). Liver Function Tests: Recent Labs  Lab 03/07/18 1927 03/08/18 1545 03/09/18 0215 03/10/18 0447  AST 32  --  28 20  ALT 24  --  24 26  ALKPHOS 54  --  54 52  BILITOT 0.3  --  0.4 0.2*  PROT 6.7 6.7 6.0* 5.9*  ALBUMIN 2.9*  --  2.4* 2.6*   No results for input(s): LIPASE, AMYLASE in the last 168 hours. No results for input(s): AMMONIA in the last 168 hours. Coagulation Profile: Recent Labs  Lab 03/10/18 0143  INR 1.09   Cardiac Enzymes: Recent Labs  Lab 03/07/18 1540  TROPONINI <0.03   BNP (last 3 results) No results for input(s): PROBNP in the last 8760 hours. HbA1C: No results for input(s): HGBA1C in the last 72 hours. CBG: Recent Labs  Lab 03/10/18 1347 03/10/18 2135 03/10/18 2322 03/11/18 0315 03/11/18 0512  GLUCAP 106* 118* 111* 114* 117*   Lipid Profile: Recent Labs    03/08/18 1545  CHOL 123   Thyroid Function Tests: No results for input(s): TSH, T4TOTAL, FREET4, T3FREE, THYROIDAB in the last 72 hours. Anemia Panel: No results for input(s): VITAMINB12, FOLATE, FERRITIN, TIBC, IRON, RETICCTPCT in the last 72 hours. Sepsis Labs: No results for input(s): PROCALCITON, LATICACIDVEN in the last 168 hours.  Recent Results (from the past 240 hour(s))  MRSA PCR Screening     Status: None   Collection Time: 03/07/18  7:58 PM  Result Value Ref Range Status   MRSA by PCR NEGATIVE NEGATIVE Final    Comment:        The GeneXpert MRSA Assay (FDA approved for NASAL specimens only), is one component of a comprehensive MRSA colonization surveillance program. It is  not intended to diagnose MRSA infection nor to guide or monitor treatment for MRSA infections. Performed at [facility], [place] [address], [place], [place] [address]   Body fluid culture     Status: None (Preliminary result)   Collection Time: 03/08/18  3:24 PM  Result Value Ref Range Status   Specimen Description PLEURAL LEFT  Final   Special Requests NONE  Final   Gram Stain   Final    ABUNDANT WBC PRESENT, PREDOMINANTLY MONONUCLEAR NO ORGANISMS SEEN    Culture   Final    NO GROWTH 2 DAYS Performed at [facility], [place] [address], [place], [place] [address]    Report Status PENDING  Incomplete  Surgical pcr screen     Status: Abnormal   Collection Time: 03/09/18  1:43 PM  Result Value Ref Range Status   MRSA, PCR NEGATIVE NEGATIVE Final   Staphylococcus aureus POSITIVE (A) NEGATIVE Final    Comment: (NOTE) The Xpert SA Assay (FDA approved for NASAL specimens in patients 40 years of age and older),  is one component of a comprehensive surveillance program. It is not intended to diagnose infection nor to guide or monitor treatment.   Culture, respiratory     Status: None (Preliminary result)   Collection Time: 03/10/18  4:09 PM  Result Value Ref Range Status   Specimen Description BRONCHIAL WASHINGS LEFT  Final   Special Requests   Final    NONE Performed at [facility], [address] [place], [place] [address]    Gram Stain   Final    FEW WBC PRESENT, PREDOMINANTLY PMN NO ORGANISMS SEEN    Culture PENDING  Incomplete   Report Status PENDING  Incomplete         Radiology Studies: Dg Chest Port 1 View  Result Date: 03/10/2018 CLINICAL DATA:  Post left VATS.  Evaluate pneumothorax.  Chest tube. EXAM: PORTABLE CHEST 1 VIEW COMPARISON:  03/08/2018 FINDINGS: Interval placement of left chest tube. Continued large left pleural effusion and diffuse airspace disease throughout the left lung. No pneumothorax. Large rounded masslike opacity in the medial  right upper lobe is stable. Right central line is in place with the tip in the SVC. No pneumothorax. IMPRESSION: Interval placement of left chest tube and right central line. No pneumothorax. Continued large left pleural effusion and near complete opacification of the left lung. Continued medial right upper lobe mass. Electronically Signed   By: Rolm Baptise M.D.   On: 03/10/2018 20:53   Vas [place] Lower Extremity Venous (dvt)  Result Date: 03/09/2018  Lower Venous Study Risk Factors: Confirmed PE New, large anterior mediastinal mass (probable lymphoma) extending from right medial lung to left lateral lung. Comparison Study: No prior study on file for comparison Performing Technologist: Sharion Dove RVS  Examination Guidelines: A complete evaluation includes B-mode imaging, spectral Doppler, color Doppler, and power Doppler as needed of all accessible portions of each vessel. Bilateral testing is considered an integral part of a complete examination. Limited examinations for reoccurring indications may be performed as noted.  Right Venous Findings: +---------+---------------+---------+-----------+----------+-------+          CompressibilityPhasicitySpontaneityPropertiesSummary +---------+---------------+---------+-----------+----------+-------+ CFV      Full           Yes      Yes                          +---------+---------------+---------+-----------+----------+-------+ SFJ      Full                                                 +---------+---------------+---------+-----------+----------+-------+ FV Prox  Full                                                 +---------+---------------+---------+-----------+----------+-------+ FV Mid   Full                                                 +---------+---------------+---------+-----------+----------+-------+ FV DistalFull                                                  +---------+---------------+---------+-----------+----------+-------+  PFV      Full                                                 +---------+---------------+---------+-----------+----------+-------+ POP      Full           Yes      Yes                          +---------+---------------+---------+-----------+----------+-------+ PTV      Full                                                 +---------+---------------+---------+-----------+----------+-------+ PERO     Full                                                 +---------+---------------+---------+-----------+----------+-------+  Left Venous Findings: +---------+---------------+---------+-----------+----------+-------+          CompressibilityPhasicitySpontaneityPropertiesSummary +---------+---------------+---------+-----------+----------+-------+ CFV      Full           Yes      Yes                          +---------+---------------+---------+-----------+----------+-------+ SFJ      Full                                                 +---------+---------------+---------+-----------+----------+-------+ FV Prox  Full                                                 +---------+---------------+---------+-----------+----------+-------+ FV Mid   Full                                                 +---------+---------------+---------+-----------+----------+-------+ FV DistalFull                                                 +---------+---------------+---------+-----------+----------+-------+ PFV      Full                                                 +---------+---------------+---------+-----------+----------+-------+ POP      Full           Yes      Yes                          +---------+---------------+---------+-----------+----------+-------+  PTV      Full                                                  +---------+---------------+---------+-----------+----------+-------+ PERO     Full                                                 +---------+---------------+---------+-----------+----------+-------+    Summary: Right: There is no evidence of deep vein thrombosis in the lower extremity. Sluggish flow noted throughout Left: There is no evidence of deep vein thrombosis in the lower extremity. Sluggish flow noted throughout  *See table(s) above for measurements and observations. Electronically signed by Servando Snare MD on 03/09/2018 at 8:52:35 PM.    Final         Scheduled Meds: . acetaminophen  1,000 mg Oral Q6H   Or  . acetaminophen (TYLENOL) oral liquid 160 mg/5 mL  1,000 mg Oral Q6H  . allopurinol  100 mg Oral BID  . bisacodyl  10 mg Oral Daily  . dexamethasone  20 mg Intravenous Daily  . pantoprazole  40 mg Oral Daily   Continuous Infusions: . 0.9 % NaCl with KCl 20 mEq / L 100 mL/hr at 03/11/18 0800  . potassium chloride       LOS: 4 days    Time spent: 25 mins.More than 50% of that time was spent in counseling and/or coordination of care.      Shelly Coss, MD [facility] Pager [phone]  If 7PM-7AM, please contact night-coverage [email or web address] Password [facility] 03/11/2018, 8:33 AM

## 2018-03-11 NOTE — Plan of Care (Signed)
  Problem: Activity: Goal: Risk for activity intolerance will decrease Outcome: Progressing   Problem: Coping: Goal: Level of anxiety will decrease Outcome: Progressing   Problem: Activity: Goal: Risk for activity intolerance will decrease Outcome: Progressing   Problem: Pain Management: Goal: Pain level will decrease Outcome: Progressing   Problem: Skin Integrity: Goal: Wound healing without signs and symptoms infection will improve Outcome: Progressing

## 2018-03-11 NOTE — Op Note (Signed)
NAMEZENITH, LAMPHIER MEDICAL RECORD [id number] ACCOUNT [id number] DATE OF BIRTH:08/20/1995 FACILITY: [facility] LOCATION: [facility] PHYSICIAN:Fernandez Kenley VAN TRIGT III, MD  OPERATIVE REPORT  DATE OF PROCEDURE:  03/10/2018  OPERATION: 1.  Video bronchoscopy with brushings and biopsies of endobronchial mass in the left upper lobe and right lower lobe bronchi. 2.  Mediastinoscopy. 3.  Left anterior VATS (video-assisted thoracoscopic surgery) with biopsy of a large mediastinal mass.  SURGEON:  Len Childs, MD  ASSISTANT:  Lars Pinks, PA-C.  ANESTHESIA:  General.  PREOPERATIVE DIAGNOSES:  Large mediastinal mass and a 22 year old male with weight loss, night sweats, cough and malaise.    POSTOPERATIVE DIAGNOSES:  Large mediastinal mass and a 22 year old male with weight loss, night sweats, cough and malaise.  DESCRIPTION OF PROCEDURE:  After the patient was checked in preoperative holding and the proper site marked and informed consent documented and the final issues were addressed with the patient and his parents, the patient was brought directly from the  preoperative holding to the operating room.  I had previously seen the patient in consultation in the hospital and reviewed the procedure of bronchoscopy, mediastinoscopy, and possible VATS biopsy to provide adequate tissue to direct therapy by the  oncology consultant.  I discussed the use of general anesthesia, the location of the incisions, and the potential risks of bleeding, infection, hemoptysis and ventilator dependence.  The patient and parents agreed to proceed with surgery.  The patient was placed supine on the operating room table.  A proper time-out was performed.  Through the endotracheal tube after intubation and anesthesia established, a fiberoptic bronchoscope was passed.  The distal trachea and carina were normal.   The right mainstem bronchus was normal.  The right upper lobe and middle lobe bronchial orifices  were normal.  The right lower lobe had evidence of a mass which was biopsied with the Bioptome and brushings.  The bronchoscope was then passed down the left  mainstem bronchus.  The lower lobe bronchus was opened and the left upper lobe bronchus was filled with tumor.  The Bioptome was used to obtain tissue from the left upper lobe.  The bronchoscope was withdrawn after some mild bleeding was controlled with  irrigation and topical diluted epinephrine.  The patient was then prepped and draped for the mediastinoscopy.  A proper time-out was performed.  A small incision was made at the sternal notch.  This incision was carried down into the pretracheal plane.  The patient had several collaterals in the  neck, which were treated with ligation and cautery.  A plane was developed into the upper mediastinum.  The mediastinoscope however, would not pass without increased force due to the patient's small body habitus, a small distance between the clavicle  heads.  I decided not to force the mediastinoscope into the small area, so the incision was closed after hemostasis was obtained.  We used Vicryl in layers and a subcuticular for the skin.  The patient was then prepared for the  VATS anterior mediastinotomy for biopsy.  The left and right chest were prepped and draped as a sterile field.  A third timeout was performed.  An incision was made in the 3rd interspace in a muscle splitting  incision.  Once we entered the interspace, there was tumor noted.  Using a 15 blade, large pieces of tumor were removed and sent for pathology studies.  This included a lymphoma markers and flow cytometry.  Hemostasis was achieved.  The ribs were not  spread.  A 28 French chest tube was placed and directed to the apex and secured to the skin.  The incision was closed in layers using Vicryl for the muscle and fascia and a running Vicryl for the subcutaneous and skin.    The chest tube was connected to underwater seal Pleur-Evac  and the patient was reversed from anesthesia and returned to the recovery room.  AN/NUANCE  D:03/10/2018 T:03/11/2018 JOB:[id number]

## 2018-03-11 NOTE — Progress Notes (Signed)
HEMATOLOGY/ONCOLOGY INPATIENT PROGRESS NOTE  Date of Service: 03/12/2018  Inpatient Attending: .Shelly Coss, MD   SUBJECTIVE:   Nicholas Moreno is accompanied today by his mother and father at bedside.   The pt reports that his breathing is slightly improved. The patient notes that his chest hurts worse when he coughs, and "can feel fluids pushing," when he coughs, at the site of recently removed chest tube.  The pt notes that he is trying to eat. He denies any leg swelling.   Lab results today (03/12/18) of CBC and CMP is as follows: all values are WNL except for WBC at WBC at 11.8k, HGB at 11.4, HCT at 37.4, MCH at 24.7, PLT at 451k, Glucose at 108, Calcium at 8.6, Total Protein at 5.1, Albumin at 2.3.  On review of systems, pt reports slightly improved breathing, pain while coughing, and denies testicular pain or swelling, leg swelling, and any other symptoms.   OBJECTIVE:  Mild discomfort from previous chest tube  PHYSICAL EXAMINATION: . Vitals:   03/12/18 1937 03/12/18 2353 03/13/18 0400 03/13/18 0818  BP: 109/64 111/65 109/60 123/64  Pulse: 83 97 81 99  Resp: (!) 25 (!) 30 (!) 23 (!) 27  Temp: 98.1 F (36.7 C) 98.4 F (36.9 C) 99.5 F (37.5 C) 97.6 F (36.4 C)  TempSrc: Oral Oral Oral Oral  SpO2: 98% 98% 95% 98%  Weight:      Height:       Filed Weights   03/07/18 2000 03/08/18 0456 03/10/18 1344  Weight: 116 lb 10 oz (52.9 kg) 116 lb 10 oz (52.9 kg) 116 lb 10 oz (52.9 kg)   .Body mass index is 18.83 kg/m.  GENERAL:alert, in mild respiratory distress with increased SOB if laying flat, improved on sitting up SKIN: no acute rashes, no significant lesions EYES: conjunctiva are pink and non-injected, sclera anicteric OROPHARYNX: MMM, no exudates, no oropharyngeal erythema or ulceration NECK: supple, no JVD LYMPH:  no palpable lymphadenopathy in the cervical, axillary or inguinal regions LUNGS: Decreased air entry throughout left lung zones, scattered  rhonchi on the right  HEART: regular rate & rhythm ABDOMEN:  normoactive bowel sounds , non tender, not distended. No palpable hepatosplenomegaly.  TESTES: No testicular swelling or tenderness to palpation Extremity: no pedal edema PSYCH: alert & oriented x 3 with fluent speech NEURO: no focal motor/sensory deficits   MEDICAL HISTORY:  History reviewed. No pertinent past medical history.  SURGICAL HISTORY: Past Surgical History:  Procedure Laterality Date  . MEDIASTINOSCOPY N/A 03/10/2018   Procedure: MEDIASTINOSCOPY;  Surgeon: Ivin Poot, MD;  Location: [facility];  Service: Thoracic;  Laterality: N/A;  . VIDEO ASSISTED THORACOSCOPY (VATS)/ LYMPH NODE SAMPLING Left 03/10/2018   Procedure: VIDEO ASSISTED THORACOSCOPY (VATS)/ BIOSPY ANTERIOR APPROACH;  Surgeon: Ivin Poot, MD;  Location: [facility];  Service: Thoracic;  Laterality: Left;  Marland Kitchen VIDEO BRONCHOSCOPY Left 03/10/2018   Procedure: VIDEO BRONCHOSCOPY;  Surgeon: Ivin Poot, MD;  Location: [facility] OR;  Service: Thoracic;  Laterality: Left;    SOCIAL HISTORY: Social History   Socioeconomic History  . Marital status: Single    Spouse name: Not on file  . Number of children: Not on file  . Years of education: Not on file  . Highest education level: Not on file  Occupational History  . Not on file  Social Needs  . Financial resource strain: Not on file  . Food insecurity:    Worry: Not on file    Inability: Not on  file  . Transportation needs:    Medical: Not on file    Non-medical: Not on file  Tobacco Use  . Smoking status: Never Smoker  . Smokeless tobacco: Never Used  Substance and Sexual Activity  . Alcohol use: Not Currently  . Drug use: Not Currently  . Sexual activity: Not on file  Lifestyle  . Physical activity:    Days per week: Not on file    Minutes per session: Not on file  . Stress: Not on file  Relationships  . Social connections:    Talks on phone: Not on file    Gets together: Not on file     Attends religious service: Not on file    Active member of club or organization: Not on file    Attends meetings of clubs or organizations: Not on file    Relationship status: Not on file  . Intimate partner violence:    Fear of current or ex partner: Not on file    Emotionally abused: Not on file    Physically abused: Not on file    Forced sexual activity: Not on file  Other Topics Concern  . Not on file  Social History Narrative  . Not on file    FAMILY HISTORY: History reviewed. No pertinent family history.  ALLERGIES:  has No Known Allergies.  MEDICATIONS:  Scheduled Meds: . acetaminophen  1,000 mg Oral Q6H   Or  . acetaminophen (TYLENOL) oral liquid 160 mg/5 mL  1,000 mg Oral Q6H  . allopurinol  100 mg Oral BID  . bisacodyl  10 mg Oral Daily  . Chlorhexidine Gluconate Cloth  6 each Topical Daily  . dextromethorphan-guaiFENesin  1 tablet Oral BID  . feeding supplement (ENSURE ENLIVE)  237 mL Oral BID BM  . multivitamin with minerals  1 tablet Oral Daily  . mupirocin ointment  1 application Nasal BID  . pantoprazole  40 mg Oral Daily  . polyethylene glycol  17 g Oral Daily   Continuous Infusions: . 0.9 % NaCl with KCl 20 mEq / L Stopped (03/11/18 1826)  . heparin    . potassium chloride     PRN Meds:.fentaNYL (SUBLIMAZE) injection, Influenza vac split quadrivalent PF, ondansetron (ZOFRAN) IV, potassium chloride, traMADol  REVIEW OF SYSTEMS:    10 Point review of Systems was done is negative except as noted above.   LABORATORY DATA:  I have reviewed the data as listed  . CBC Latest Ref Rng & Units 03/12/2018 03/11/2018 03/10/2018  WBC 4.0 - 10.5 K/uL 11.8(H) 11.5(H) 16.3(H)  Hemoglobin 13.0 - 17.0 g/dL 11.4(L) 11.3(L) 12.8(L)  Hematocrit 39.0 - 52.0 % 37.4(L) 37.2(L) 40.2  Platelets 150 - 400 K/uL 451(H) 471(H) 550(H)    . CMP Latest Ref Rng & Units 03/12/2018 03/11/2018 03/10/2018  Glucose 70 - 99 mg/dL 108(H) 117(H) 107(H)  BUN 6 - 20 mg/dL 17 16 9     Creatinine 0.61 - 1.24 mg/dL 0.91 0.81 0.67  Sodium 135 - 145 mmol/L 138 137 139  Potassium 3.5 - 5.1 mmol/L 4.2 4.3 4.0  Chloride 98 - 111 mmol/L 103 103 102  CO2 22 - 32 mmol/L 28 24 31   Calcium 8.9 - 10.3 mg/dL 8.6(L) 8.6(L) 8.8(L)  Total Protein 6.5 - 8.1 g/dL 5.1(L) - 5.9(L)  Total Bilirubin 0.3 - 1.2 mg/dL 0.3 - 0.2(L)  Alkaline Phos 38 - 126 U/L 46 - 52  AST 15 - 41 U/L 26 - 20  ALT 0 - 44 U/L 21 -  26     RADIOGRAPHIC STUDIES: I have personally reviewed the radiological images as listed and agreed with the findings in the report. Ct Angio Chest Pe W And/or Wo Contrast  Result Date: 03/07/2018 CLINICAL DATA:  Shortness of breath and cough. EXAM: CT ANGIOGRAPHY CHEST WITH CONTRAST TECHNIQUE: Multidetector CT imaging of the chest was performed using the standard protocol during bolus administration of intravenous contrast. Multiplanar CT image reconstructions and MIPs were obtained to evaluate the vascular anatomy. CONTRAST:  143mL ISOVUE-370 IOPAMIDOL (ISOVUE-370) INJECTION 76% COMPARISON:  None. FINDINGS: Cardiovascular: Normal heart size. The main pulmonary artery appears patent. Filling defect within the left upper lobe lobar pulmonary artery, image 104 through image 101 compatible with acute pulmonary emboli. Mediastinum/Nodes: A very large anterior mediastinal mass is identified which extends into both upper lung zones. This mass encases and narrows the superior vena cava as well as the branch vessels off the aortic arch. There is extensive chest wall collaterals especially in the left supraclavicular region and paraspinal region. Encasement and narrowing of the left main pulmonary artery and bilateral upper lobe pulmonary arteries. Encasement and narrowing of the trachea is identified with rightward and posterior displacement. The left mainstem bronchus is narrowed, and bilateral upper lobe bronchi are occluded. Narrowing of the left lower lobe bronchi. Lungs/Pleura: There is a  moderate left pleural effusion. Approximately 90% opacification of the left upper lobe is identified secondary to tumor and postobstructive consolidation. A small amount of aerated lung within the apex. Compressive type atelectasis is noted within the left lung base. There is approximately 50% opacification of the right upper lobe with patchy areas of airspace consolidation and ground-glass attenuation within the remaining portions of the right upper lobe. The mass within the right upper lobe contains central areas of cavitation compatible with necrosis. Upper Abdomen: No acute findings. Musculoskeletal: No chest wall abnormality. No acute or significant osseous findings. Review of the MIP images confirms the above findings. IMPRESSION: 1. Small acute pulmonary emboli identified within the distal left upper lobe lobar pulmonary artery. 2. 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 Primary differential considerations include lymphoma and leukemia as well as malignant derm cell tumors. 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 Narrowing and displacement of the trachea with complete occlusion of the upper lobe bronchi. There is also marked narrowing of bilateral upper lobe pulmonary arteries. 3. Significantly diminished aeration to both upper lobes, left greater than right. 4. Moderate left pleural effusion. Critical Value/emergent results were called by telephone at the time of interpretation on 03/07/2018 at 5:29 pm to Dr. Carlisle Cater , who verbally acknowledged these results. Electronically Signed   By: Kerby Moors M.D.   On: 03/07/2018 17:30   Dg Chest Port 1 View  Result Date: 03/12/2018 CLINICAL DATA:  22 year old male with mediastinal mass, status post VATS and biopsy with mediastinoscopy 05/12/2017,  suspected lymphoma. EXAM: PORTABLE CHEST 1 VIEW COMPARISON:  Chest x-ray 03/11/2018, 03/10/2018, 03/08/2018, CT 03/07/2018 FINDINGS: Cardiomediastinal silhouette partially obscured by lung/pleural disease/mediastinal disease, unchanged. In the interval there has been development of left apical pneumothorax component. Opacity at the left base obscuring the left costophrenic angle in the left heart border. Unchanged left thoracostomy tube. Right IJ central venous catheter, unchanged. No right-sided confluent airspace disease. No pleural effusion or pneumothorax. IMPRESSION: Interval development of left apical pneumothorax, either ex vacuo,  or representing air leak. Unchanged position of thoracostomy tube. Unchanged right IJ central venous catheter. Similar appearance of cardiomediastinal silhouette, with obscuration of the heart borders and predominantly left lung by known mediastinal mass and left lung consolidation, status post VATS and mediastinoscopy. These results were called by telephone at the time of interpretation on 03/12/2018 at 10:06 am to nurse caring for patient, Ms Lilia Pro in [facility]. Electronically Signed   By: Corrie Mckusick D.O.   On: 03/12/2018 10:07   Dg Chest Port 1 View  Result Date: 03/11/2018 CLINICAL DATA:  Postop chest biopsy.  Chest tube. EXAM: PORTABLE CHEST 1 VIEW COMPARISON:  03/10/2018 FINDINGS: Near complete opacification of the left chest again noted due to large mediastinal mass and collapse of the left lung. Left pleural effusion is present. Left chest tube remains in place and unchanged. No pneumothorax. Mediastinal mass is present crossing the midline to the right unchanged. Remainder of the right lung is normally aerated Right jugular central venous catheter tip in the mid SVC unchanged. No pneumothorax on the right. IMPRESSION: Large mediastinal mass unchanged. Compression of the left lung with left effusion unchanged. Left chest tube in place No pneumothorax. Electronically  Signed   By: Franchot Gallo M.D.   On: 03/11/2018 08:53   Dg Chest Port 1 View  Result Date: 03/10/2018 CLINICAL DATA:  Post left VATS.  Evaluate pneumothorax.  Chest tube. EXAM: PORTABLE CHEST 1 VIEW COMPARISON:  03/08/2018 FINDINGS: Interval placement of left chest tube. Continued large left pleural effusion and diffuse airspace disease throughout the left lung. No pneumothorax. Large rounded masslike opacity in the medial right upper lobe is stable. Right central line is in place with the tip in the SVC. No pneumothorax. IMPRESSION: Interval placement of left chest tube and right central line. No pneumothorax. Continued large left pleural effusion and near complete opacification of the left lung. Continued medial right upper lobe mass. Electronically Signed   By: Rolm Baptise M.D.   On: 03/10/2018 20:53   Dg Chest Port 1 View  Result Date: 03/08/2018 CLINICAL DATA:  Status post left thoracentesis EXAM: PORTABLE CHEST 1 VIEW COMPARISON:  Chest CT from 1 day prior FINDINGS: Near complete opacification of the left hemithorax with asymmetric volume loss in the left hemithorax, not substantially changed. Marked thickening of the upper mediastinum bilaterally, unchanged. Cardiac silhouette is obscured, with the heart appearing normal in size. No pneumothorax. No right pleural effusion. Persistent moderate left pleural effusion. Stable masslike opacity in the medial upper right lung with right upper lobe volume loss. IMPRESSION: 1. No pneumothorax.  Persistent moderate left pleural effusion. 2. Stable marked thickening of the upper mediastinum bilaterally compatible with known infiltrative anterior mediastinal mass. 3. Persistent volume loss in the left hemithorax and right upper lung. Stable near complete left lung opacification and masslike medial right upper lung opacity compatible with a combination of atelectasis and tumor. Electronically Signed   By: Ilona Sorrel M.D.   On: 03/08/2018 15:35   Vas [place]  Lower Extremity Venous (dvt)  Result Date: 03/09/2018  Lower Venous Study Risk Factors: Confirmed PE New, large anterior mediastinal mass (probable lymphoma) extending from right medial lung to left lateral lung. Comparison Study: No prior study on file for comparison Performing Technologist: Sharion Dove RVS  Examination Guidelines: A complete evaluation includes B-mode imaging, spectral Doppler, color Doppler, and power Doppler as needed of all accessible portions of each vessel. Bilateral testing is considered an integral part of a complete examination. Limited examinations  for reoccurring indications may be performed as noted.  Right Venous Findings: +---------+---------------+---------+-----------+----------+-------+          CompressibilityPhasicitySpontaneityPropertiesSummary +---------+---------------+---------+-----------+----------+-------+ CFV      Full           Yes      Yes                          +---------+---------------+---------+-----------+----------+-------+ SFJ      Full                                                 +---------+---------------+---------+-----------+----------+-------+ FV Prox  Full                                                 +---------+---------------+---------+-----------+----------+-------+ FV Mid   Full                                                 +---------+---------------+---------+-----------+----------+-------+ FV DistalFull                                                 +---------+---------------+---------+-----------+----------+-------+ PFV      Full                                                 +---------+---------------+---------+-----------+----------+-------+ POP      Full           Yes      Yes                          +---------+---------------+---------+-----------+----------+-------+ PTV      Full                                                  +---------+---------------+---------+-----------+----------+-------+ PERO     Full                                                 +---------+---------------+---------+-----------+----------+-------+  Left Venous Findings: +---------+---------------+---------+-----------+----------+-------+          CompressibilityPhasicitySpontaneityPropertiesSummary +---------+---------------+---------+-----------+----------+-------+ CFV      Full           Yes      Yes                          +---------+---------------+---------+-----------+----------+-------+ SFJ      Full                                                 +---------+---------------+---------+-----------+----------+-------+  FV Prox  Full                                                 +---------+---------------+---------+-----------+----------+-------+ FV Mid   Full                                                 +---------+---------------+---------+-----------+----------+-------+ FV DistalFull                                                 +---------+---------------+---------+-----------+----------+-------+ PFV      Full                                                 +---------+---------------+---------+-----------+----------+-------+ POP      Full           Yes      Yes                          +---------+---------------+---------+-----------+----------+-------+ PTV      Full                                                 +---------+---------------+---------+-----------+----------+-------+ PERO     Full                                                 +---------+---------------+---------+-----------+----------+-------+    Summary: Right: There is no evidence of deep vein thrombosis in the lower extremity. Sluggish flow noted throughout Left: There is no evidence of deep vein thrombosis in the lower extremity. Sluggish flow noted throughout  *See table(s) above for measurements and  observations. Electronically signed by Servando Snare MD on 03/09/2018 at 8:52:35 PM.    Final    Vas [place] Upper Extremity Venous Duplex  Result Date: 03/12/2018 UPPER VENOUS STUDY  Indications: Edema, and SVC syndrome, chest mass Performing Technologist: Landry Mellow RDMS, RVT  Examination Guidelines: A complete evaluation includes B-mode imaging, spectral Doppler, color Doppler, and power Doppler as needed of all accessible portions of each vessel. Bilateral testing is considered an integral part of a complete examination. Limited examinations for reoccurring indications may be performed as noted.  Right Findings: +----------+------------+----------+---------+-----------+---------------------+ RIGHT     CompressiblePropertiesPhasicitySpontaneous       Summary        +----------+------------+----------+---------+-----------+---------------------+ IJV                                                  unable to image due  to IJ line       +----------+------------+----------+---------+-----------+---------------------+ Subclavian    Full                 Yes       Yes                          +----------+------------+----------+---------+-----------+---------------------+ Axillary      Full                 Yes       Yes                          +----------+------------+----------+---------+-----------+---------------------+ Brachial      Full                 Yes       Yes                          +----------+------------+----------+---------+-----------+---------------------+ Radial        Full                 Yes       Yes                          +----------+------------+----------+---------+-----------+---------------------+ Ulnar         Full                                                        +----------+------------+----------+---------+-----------+---------------------+ Cephalic      Full                                                         +----------+------------+----------+---------+-----------+---------------------+ Basilic       Full                                                        +----------+------------+----------+---------+-----------+---------------------+  Left Findings: +----------+------------+----------+---------+-----------+-------+ LEFT      CompressiblePropertiesPhasicitySpontaneousSummary +----------+------------+----------+---------+-----------+-------+ IJV         Partial                Yes       Yes     Acute  +----------+------------+----------+---------+-----------+-------+ Subclavian  Partial                Yes       Yes     Acute  +----------+------------+----------+---------+-----------+-------+ Axillary      Full                 Yes       Yes            +----------+------------+----------+---------+-----------+-------+ Brachial      Full                 Yes       Yes            +----------+------------+----------+---------+-----------+-------+  Radial        Full                                          +----------+------------+----------+---------+-----------+-------+ Ulnar         Full                                          +----------+------------+----------+---------+-----------+-------+ Cephalic      Full                                          +----------+------------+----------+---------+-----------+-------+ Basilic       Full                                          +----------+------------+----------+---------+-----------+-------+  Summary:  Right: No evidence of deep vein thrombosis in the upper extremity. No evidence of superficial vein thrombosis in the upper extremity.  Left: No evidence of superficial vein thrombosis in the upper extremity. Findings consistent with acute deep vein thrombosis involving the left internal jugular veins and left subclavian veins.  *See table(s) above for  measurements and observations.    Preliminary     ASSESSMENT & PLAN:  22 y.o. 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 Other possibilities would would be extragonadal germ cell tumor,bulky Hodgkin's lymphomaetc. Elevated LDH level consistent with a high-grade lymphoma-like process CBC not markedly abnormal at this time. No peripheral blood leukocytosis/lymphocytosis at this time. #2 SVC compression due to mediastinal mass without overt SVC syndrome clinical symptoms #3 small acute distal left upper lobe pulmonary embolus-on IV heparin #4 left-sided pleural effusion and left-sided lung atelectasis due to airway compression from mediastinal mass #5 significant weight loss of 20 pounds likely due to malignancy #6 drenching night sweats likely has constitutional symptoms from his mediastinal malignancy.  PLAN:  -patient notes improvement in breathing and appetite with steroids -Completed 4 doses of Dexamethasone 20mg  daily -03/10/18 Pathology is pending - discussed prelim pathology results with [name] -- findings consistent with lymphoma - awaiting further characterization. -continue allopurinol 100mg  po BID -needs CT abd/pelvis and then outpatient PET/CT when possible.  -Discussed pt labwork today, 03/12/18; WBC decreasing now to 11.8k, HGB stable at 11.4, PLT decreased to 451k -Discussed the preliminary pathology evaluation which is concerning for a high grade lymphoma, and the plan to initiate treatment as inpatient  -After pathology is finalized, will finalize treatment, but anticipating EPOCH +/-R -Will order CT C/A/P and ECHO as inpatient, will complete PET/CT as outpatient -Anticipating transfer to [facility] for treatment in the next 1-2 days -Discussed option for sperm banking given the risk of decreased sperm counts as a result of chemotherapy but expedient need for treatment might may this difficult -will be able  to define definitive treatment once tissue diagnosis available. -Will continue to follow up   The total time spent in the appt was 35 minutes and more than 50% was on counseling and direct patient cares.    Sullivan Lone MD MS AAHIVMS [facility] Hematology/Oncology Physician [facility]  (  Office):       [phone] (Work cell):  [phone] (Fax):           [phone]  03/12/2018 2:12 PM   I, Baldwin Jamaica, am acting as a scribe for Dr. Sullivan Lone.   .I have reviewed the above documentation for accuracy and completeness, and I agree with the above. Sullivan Lone MD MS

## 2018-03-12 ENCOUNTER — Inpatient Hospital Stay (HOSPITAL_COMMUNITY): Payer: BLUE CROSS/BLUE SHIELD

## 2018-03-12 DIAGNOSIS — E440 Moderate protein-calorie malnutrition: Secondary | ICD-10-CM

## 2018-03-12 DIAGNOSIS — R600 Localized edema: Secondary | ICD-10-CM

## 2018-03-12 LAB — COMPREHENSIVE METABOLIC PANEL
ALT: 21 U/L (ref 0–44)
AST: 26 U/L (ref 15–41)
Albumin: 2.3 g/dL — ABNORMAL LOW (ref 3.5–5.0)
Alkaline Phosphatase: 46 U/L (ref 38–126)
Anion gap: 7 (ref 5–15)
BUN: 17 mg/dL (ref 6–20)
CO2: 28 mmol/L (ref 22–32)
Calcium: 8.6 mg/dL — ABNORMAL LOW (ref 8.9–10.3)
Chloride: 103 mmol/L (ref 98–111)
Creatinine, Ser: 0.91 mg/dL (ref 0.61–1.24)
GFR calc non Af Amer: >60 mL/min (ref >60)
Glucose, Bld: 108 mg/dL — ABNORMAL HIGH (ref 70–99)
Potassium: 4.2 mmol/L (ref 3.5–5.1)
SODIUM: 138 mmol/L (ref 135–145)
Total Bilirubin: 0.3 mg/dL (ref 0.3–1.2)
Total Protein: 5.1 g/dL — ABNORMAL LOW (ref 6.5–8.1)

## 2018-03-12 LAB — CULTURE, RESPIRATORY W GRAM STAIN: Culture: NORMAL

## 2018-03-12 LAB — CBC
HCT: 37.4 % — ABNORMAL LOW (ref 39.0–52.0)
HEMOGLOBIN: 11.4 g/dL — AB (ref 13.0–17.0)
MCH: 24.7 pg — ABNORMAL LOW (ref 26.0–34.0)
MCHC: 30.5 g/dL (ref 30.0–36.0)
MCV: 81.1 fL (ref 80.0–100.0)
Platelets: 451 10*3/uL — ABNORMAL HIGH (ref 150–400)
RBC: 4.61 MIL/uL (ref 4.22–5.81)
RDW: 12.6 % (ref 11.5–15.5)
WBC: 11.8 10*3/uL — ABNORMAL HIGH (ref 4.0–10.5)
nRBC: 0 % (ref 0.0–0.2)

## 2018-03-12 MED ORDER — HEPARIN (PORCINE) 25000 UT/250ML-% IV SOLN
1150.0000 [IU]/h | INTRAVENOUS | Status: DC
  Administered 2018-03-12: 1400 [IU]/h via INTRAVENOUS
  Administered 2018-03-13: 1150 [IU]/h via INTRAVENOUS
  Filled 2018-03-12 (×2): qty 250

## 2018-03-12 NOTE — Progress Notes (Signed)
Preliminary report of Duplex venous US shows left IJ and left subclavian DVT. As discussed with Dr. Prescott Gum, will start Heparin drip (NO BOLUS).

## 2018-03-12 NOTE — Progress Notes (Addendum)
      [address]       [place] [address]             [phone]       2 Days Post-Op Procedure(s) (LRB): VIDEO BRONCHOSCOPY (Left) MEDIASTINOSCOPY (N/A) VIDEO ASSISTED THORACOSCOPY (VATS)/ BIOSPY ANTERIOR APPROACH (Left)  Subjective: Patient with incisional pain and swollen left arm and hand  Objective: Vital signs in last 24 hours: Temp:  [96.4 F (35.8 C)-97.8 F (36.6 C)] 97.5 F (36.4 C) (12/04 0526) Pulse Rate:  [53-95] 53 (12/04 0526) Cardiac Rhythm: Normal sinus rhythm;Bundle branch block (12/04 0526) Resp:  [13-24] 22 (12/04 0526) BP: (97-126)/(61-78) 110/67 (12/04 0526) SpO2:  [95 %-100 %] 97 % (12/04 0526) Arterial Line BP: (123)/(68) 123/68 (12/03 0800)      Intake/Output from previous day: 12/03 0701 - 12/04 0700 In: 1381.1 [P.O.:960; I.V.:321.1; IV Piggyback:100] Out: 1100 [Urine:950; Chest Tube:150]   Physical Exam:  Cardiovascular: RRR Pulmonary: Clear to auscultation on the right and very diminished on the elft Abdomen: Soft, non tender, bowel sounds present. Wounds: Dressings removed and wounds are clean and dry.  No erythema or signs of infection. Chest Tube: to water seal, tidling with cough but no true air leak  Lab Results: CBC: Recent Labs    03/11/18 0543 03/12/18 0530  WBC 11.5* 11.8*  HGB 11.3* 11.4*  HCT 37.2* 37.4*  PLT 471* 451*   BMET:  Recent Labs    03/11/18 0543 03/12/18 0530  NA 137 138  K 4.3 4.2  CL 103 103  CO2 24 28  GLUCOSE 117* 108*  BUN 16 17  CREATININE 0.81 0.91  CALCIUM 8.6* 8.6*    PT/INR:  Recent Labs    03/10/18 0143  LABPROT 14.0  INR 1.09   ABG:  INR: Will add last result for INR, ABG once components are confirmed Will add last 4 CBG results once components are confirmed  Assessment/Plan: 1. CV-SB/SR in the 50's this am. 2. Pulmonary-On room air. Chest tube with 150 cc of output. Chest tube is to water seal and there is no air leak.  CXR this am appears to show  left apical space, left pleural effusion and near opacification of the left lung (mostly mass). Per Dr. Prescott Gum, remove chest tube. Encourage incentive spirometer. 3. ABL anemia-H and H stable at 11.4 and 37.4 this am 4. Small LUL PE-per Dr. Prescott Gum, no more Heparin drip. On Lovenox 5. Thrombocytosis-platelets 451,000 6. LUE swelling-could be related to superior vena cava syndrome but as discussed with Dr. Prescott Gum, will check for DVT.  If has DVT, will start Apixaban  Donielle M ZimmermanPA-C 03/12/2018,7:24 AM [phone]  Patient progressing well after surgery We will remove chest tube today Check left arm for DVT with duplex scan If positive for DVT patient will need Eliquis which can be started 12 hours after chest tube removal.  Lovenox prophylactic dose should be stopped if Eliquis added. Pathology pending. patient examined and medical record reviewed,agree with above note. Tharon Aquas Trigt III 03/12/2018

## 2018-03-12 NOTE — Progress Notes (Signed)
PROGRESS NOTE    Nicholas Moreno  VFI:[id number] DOB: July 29, 1995 DOA: 03/07/2018 PCP: Patient, No Pcp Per   Brief Narrative: Patient is a 22 year old filipino male who presented with SOB.Work up revealed large mediastinal mass ,left pleural effusion and left pulmonary embolism.  Started on heparin drip and underwent left sided thoracentesis by PCCM. Oncology consulted.Underwent mediastinal mass biopsy  by CT surgery through VATS procedure.S/P chest tube placement.Plan for chest tube removal today. Biopsy pending.   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  LDH elevated.Marland Kitchen  He underwent VATS procedure with biopsy the mediastinal mass and also biopsy of the right lowerr lobe and left upper lobe masses.S/P chest tube placement.  Oncology following. Also started on high-dose dexamethasone and he completed the three days course. AFP tumor marker negative.Will follow-up histopathology report,still pending. Chest Xray on 12/2 showed continued large left pleural effusion and near complete opacification of the left lung. Continued medial right upper lobe mass. Continue pain management.Plan for chest tube removal today as per CT surgery.  Superior venacava compression from mass:Completed course of  dexamethasone.  Also on Protonix.  Has LUE swelling , which could be associated with superior vena cava syndrome.  CT surgery checking venous Doppler to rule out DVT.  Leukocytosis: Mild.Secondary to steroids.  We will continue to monitor.    Left pleural effusion: Status post thoracentesis.  Fluid exudative in nature most likely associated with malignancy.  Patient has persistent large left pleural effusion with near complete opacification of the left lung.  Single subsegmental pulmonary embolism: CT surgery change IV heparin to  Lovenox.  We might change the anticoagulation to oral on discharge.   DVT prophylaxis:Lovenox Code Status: Full Family Communication: Mother present at the bedside Disposition Plan: Home after full work-up,chest tube removal   Consultants: Oncology, CT surgery, PCCM  Procedures: Paracentesis,VATS procedure.chest tube placement  Antimicrobials: None  Subjective: Patient seen and examined the bedside this morning.  Remains hemodynamically stable.  Complains of the pain on the incisional sites.  Denies any chest pain or shortness of breath.  Objective: Vitals:   03/11/18 1843 03/11/18 1949 03/12/18 0002 03/12/18 0526  BP: 120/69 126/72 107/62 110/67  Pulse: 72 70 (!) 58 (!) 53  Resp: (!) 23 (!) 21 19 (!) 22  Temp: 97.8 F (36.6 C) 97.7 F (36.5 C) (!) 97.5 F (36.4 C) (!) 97.5 F (36.4 C)  TempSrc: Oral Oral Oral Oral  SpO2: 100% 100% 99% 97%  Weight:      Height:        Intake/Output Summary (Last 24 hours) at 03/12/2018 0756 Last data filed at 03/12/2018 0526 Gross per 24 hour  Intake 1381.09 ml  Output 1050 ml  Net 331.09 ml   Filed Weights   03/07/18 2000 03/08/18 0456 03/10/18 1344  Weight: 52.9 kg 52.9 kg 52.9 kg    Examination:  General exam: Not in distress,thin built HEENT:PERRL,Oral mucosa moist, Ear/Nose normal on gross exam,right sided central line on the neck Respiratory system:  Decreased air entry on the left side, chest tube on left,clean surgical wounds on the chest Cardiovascular system: S1 & S2 heard, RRR. No JVD, murmurs, rubs, gallops or clicks. No pedal edema. Gastrointestinal system: Abdomen is nondistended, soft and nontender. No organomegaly or masses felt. Normal bowel sounds heard. Central nervous system: Alert and oriented.  No focal neurological deficits. Extremities: No edema, no clubbing ,no cyanosis, distal peripheral pulses palpable. Skin: No rashes, lesions or ulcers,no icterus ,no pallor MSK: Normal muscle bulk,tone  ,power Psychiatry: Judgement and insight appear normal. Mood & affect appropriate.     Data Reviewed: I have personally reviewed following labs and imaging studies  CBC: Recent Labs  Lab 03/07/18 1535 03/08/18 0240 03/09/18 0215 03/10/18 0143 03/10/18 0447 03/11/18 0543 03/12/18 0530  WBC 7.3 8.2 6.8 17.5* 16.3* 11.5* 11.8*  NEUTROABS 5.3 6.1 6.1 15.8* 14.7*  --   --   HGB 14.1 12.1* 12.2* 12.0* 12.8* 11.3* 11.4*  HCT 45.6 38.0* 39.8 38.3* 40.2 37.2* 37.4*  MCV 82.2 80.2 80.2 79.3* 79.4* 81.6 81.1  PLT 319 457* 534* 597* 550* 471* 144*   Basic Metabolic Panel: Recent Labs  Lab 03/07/18 1535 03/09/18 0215 03/10/18 0447 03/11/18 0543 03/12/18 0530  NA 135 133* 139 137 138  K 4.2 3.9 4.0 4.3 4.2  CL 98 98 102 103 103  CO2 27 22 31 24 28   GLUCOSE 87 213* 107* 117* 108*  BUN 7 8 9 16 17   CREATININE 0.96 0.77 0.67 0.81 0.91  CALCIUM 8.7* 8.7* 8.8* 8.6* 8.6*   GFR: Estimated Creatinine Clearance: 96.1 mL/min (by C-G formula based on SCr of 0.91 mg/dL). Liver Function Tests: Recent Labs  Lab 03/07/18 1927 03/08/18 1545 03/09/18 0215 03/10/18 0447 03/12/18 0530  AST 32  --  28 20 26   ALT 24  --  24 26 21   ALKPHOS 54  --  54 52 46  BILITOT 0.3  --  0.4 0.2* 0.3  PROT 6.7 6.7 6.0* 5.9* 5.1*  ALBUMIN 2.9*  --  2.4* 2.6* 2.3*   No results for input(s): LIPASE, AMYLASE in the last 168 hours. No results for input(s): AMMONIA in the last 168 hours. Coagulation Profile: Recent Labs  Lab 03/10/18 0143  INR 1.09   Cardiac Enzymes: Recent Labs  Lab 03/07/18 1540  TROPONINI <0.03   BNP (last 3 results) No results for input(s): PROBNP in the last 8760 hours. HbA1C: No results for input(s): HGBA1C in the last 72 hours. CBG: Recent Labs  Lab 03/10/18 2135 03/10/18 2322 03/11/18 0315 03/11/18 0512 03/11/18 1129  GLUCAP 118* 111* 114* 117* 122*   Lipid Profile: No results for input(s): CHOL, HDL, LDLCALC, TRIG, CHOLHDL, LDLDIRECT in the last 72  hours. Thyroid Function Tests: No results for input(s): TSH, T4TOTAL, FREET4, T3FREE, THYROIDAB in the last 72 hours. Anemia Panel: No results for input(s): VITAMINB12, FOLATE, FERRITIN, TIBC, IRON, RETICCTPCT in the last 72 hours. Sepsis Labs: No results for input(s): PROCALCITON, LATICACIDVEN in the last 168 hours.  Recent Results (from the past 240 hour(s))  MRSA PCR Screening     Status: None   Collection Time: 03/07/18  7:58 PM  Result Value Ref Range Status   MRSA by PCR NEGATIVE NEGATIVE Final    Comment:        The GeneXpert MRSA Assay (FDA approved for NASAL specimens only), is one component of a comprehensive MRSA colonization surveillance program. It is not intended to diagnose MRSA infection nor to guide or monitor treatment for MRSA infections. Performed at [facility], [place] [address], [place], [place] [address]   Body fluid culture     Status: None   Collection Time: 03/08/18  3:24 PM  Result Value Ref Range Status   Specimen Description PLEURAL LEFT  Final   Special Requests NONE  Final  Gram Stain   Final    ABUNDANT WBC PRESENT, PREDOMINANTLY MONONUCLEAR NO ORGANISMS SEEN    Culture   Final    NO GROWTH 3 DAYS Performed at [facility] [address], [place], [place] [address]    Report Status 03/11/2018 FINAL  Final  Surgical pcr screen     Status: Abnormal   Collection Time: 03/09/18  1:43 PM  Result Value Ref Range Status   MRSA, PCR NEGATIVE NEGATIVE Final   Staphylococcus aureus POSITIVE (A) NEGATIVE Final    Comment: (NOTE) The Xpert SA Assay (FDA approved for NASAL specimens in patients 34 years of age and older), is one component of a comprehensive surveillance program. It is not intended to diagnose infection nor to guide or monitor treatment.   Culture, fungus without smear     Status: None (Preliminary result)   Collection Time: 03/10/18  4:09 PM  Result Value Ref Range Status   Specimen Description BRONCHIAL WASHINGS  LEFT  Final   Special Requests NONE  Final   Culture   Final    NO FUNGUS ISOLATED AFTER 1 DAY Performed at [facility], [address] [place], [place] [address]    Report Status PENDING  Incomplete  Acid Fast Smear (AFB)     Status: None   Collection Time: 03/10/18  4:09 PM  Result Value Ref Range Status   AFB Specimen Processing Concentration  Final   Acid Fast Smear Negative  Final    Comment: (NOTE) Performed At: [facility] [place], [place] [id number] Rush Farmer MD QP:[id number]    Source (AFB) BRONCHIAL WASHINGS  Final    Comment: LEFT Performed at [facility], [place] [address] [place], [place] [address]   Culture, respiratory     Status: None (Preliminary result)   Collection Time: 03/10/18  4:09 PM  Result Value Ref Range Status   Specimen Description BRONCHIAL WASHINGS LEFT  Final   Special Requests NONE  Final   Gram Stain   Final    FEW WBC PRESENT, PREDOMINANTLY PMN NO ORGANISMS SEEN    Culture   Final    NO GROWTH < 24 HOURS Performed at [facility], [place] [address], [place], [place] [address]    Report Status PENDING  Incomplete         Radiology Studies: Dg Chest Port 1 View  Result Date: 03/11/2018 CLINICAL DATA:  Postop chest biopsy.  Chest tube. EXAM: PORTABLE CHEST 1 VIEW COMPARISON:  03/10/2018 FINDINGS: Near complete opacification of the left chest again noted due to large mediastinal mass and collapse of the left lung. Left pleural effusion is present. Left chest tube remains in place and unchanged. No pneumothorax. Mediastinal mass is present crossing the midline to the right unchanged. Remainder of the right lung is normally aerated Right jugular central venous catheter tip in the mid SVC unchanged. No pneumothorax on the right. IMPRESSION: Large mediastinal mass unchanged. Compression of the left lung with left effusion unchanged. Left chest tube in place No pneumothorax. Electronically Signed   By:  Franchot Gallo M.D.   On: 03/11/2018 08:53   Dg Chest Port 1 View  Result Date: 03/10/2018 CLINICAL DATA:  Post left VATS.  Evaluate pneumothorax.  Chest tube. EXAM: PORTABLE CHEST 1 VIEW COMPARISON:  03/08/2018 FINDINGS: Interval placement of left chest tube. Continued large left pleural effusion and diffuse airspace disease throughout the left lung. No pneumothorax. Large rounded masslike opacity in  the medial right upper lobe is stable. Right central line is in place with the tip in the SVC. No pneumothorax. IMPRESSION: Interval placement of left chest tube and right central line. No pneumothorax. Continued large left pleural effusion and near complete opacification of the left lung. Continued medial right upper lobe mass. Electronically Signed   By: Rolm Baptise M.D.   On: 03/10/2018 20:53        Scheduled Meds: . acetaminophen  1,000 mg Oral Q6H   Or  . acetaminophen (TYLENOL) oral liquid 160 mg/5 mL  1,000 mg Oral Q6H  . allopurinol  100 mg Oral BID  . bisacodyl  10 mg Oral Daily  . Chlorhexidine Gluconate Cloth  6 each Topical Daily  . enoxaparin (LOVENOX) injection  40 mg Subcutaneous Q24H  . feeding supplement (ENSURE ENLIVE)  237 mL Oral BID BM  . ketorolac  15 mg Intravenous Q6H  . multivitamin with minerals  1 tablet Oral Daily  . mupirocin ointment  1 application Nasal BID  . pantoprazole  40 mg Oral Daily  . polyethylene glycol  17 g Oral Daily   Continuous Infusions: . 0.9 % NaCl with KCl 20 mEq / L Stopped (03/11/18 1826)  . potassium chloride       LOS: 5 days    Time spent: 25 mins.More than 50% of that time was spent in counseling and/or coordination of care.      Shelly Coss, MD [facility] Pager [phone]  If 7PM-7AM, please contact night-coverage [email or web address] Password [email or web address] 03/12/2018, 7:56 AM

## 2018-03-12 NOTE — Progress Notes (Signed)
ANTICOAGULATION CONSULT NOTE - Follow Up Consult  Pharmacy Consult for heparin Indication: pulmonary embolus / new acute LUE DVT  HEPARIN DW (KG): 52.9   Labs: Recent Labs    03/09/18 1627  03/10/18 0143 03/10/18 0447 03/11/18 0543 03/12/18 0530  HGB  --    < > 12.0* 12.8* 11.3* 11.4*  HCT  --    < > 38.3* 40.2 37.2* 37.4*  PLT  --    < > 597* 550* 471* 451*  LABPROT  --   --  14.0  --   --   --   INR  --   --  1.09  --   --   --   HEPARINUNFRC 0.24*  --  0.53  --   --   --   CREATININE  --   --   --  0.67 0.81 0.91   < > = values in this interval not displayed.    Assessment: 22yo male s/p left VATS/biopsy. Small PE noted prior to surgery but CVTS did not feel that it warranted full anticoagulation. Now s/p dopplers with LUE DVT.   D/w anticoagulation with surgery rounding on unit, will defer apixaban for now and start conservative heparin infusion without bolus this afternoon. Chest tube removed this am ~0930. Will aim for lower goal today and if stable overnight on anticoagulation will broaden range.   Goal of Therapy:  Heparin level 0.3-0.5 units/ml  Plan:  Start heparin at 1400 units/hr-was at low end of range previously at this dose 6 hours heparin level  Thanks for allowing pharmacy to be a part of this patient's care.  Erin Hearing PharmD., BCPS Clinical Pharmacist 03/12/2018 1:25 PM

## 2018-03-12 NOTE — Progress Notes (Signed)
BUE venous duplex       has been completed. Preliminary results can be found under CV proc through chart review. Landry Mellow, RDMS, RVT  Text paged results to Dr. Tawanna Solo at 11:50 am

## 2018-03-13 ENCOUNTER — Inpatient Hospital Stay (HOSPITAL_COMMUNITY): Payer: BLUE CROSS/BLUE SHIELD

## 2018-03-13 DIAGNOSIS — I340 Nonrheumatic mitral (valve) insufficiency: Secondary | ICD-10-CM

## 2018-03-13 DIAGNOSIS — C8592 Non-Hodgkin lymphoma, unspecified, intrathoracic lymph nodes: Secondary | ICD-10-CM

## 2018-03-13 DIAGNOSIS — I361 Nonrheumatic tricuspid (valve) insufficiency: Secondary | ICD-10-CM

## 2018-03-13 LAB — ECHOCARDIOGRAM COMPLETE
Height: 65.984 in
Weight: 1865.97 oz

## 2018-03-13 LAB — CBC
HCT: 39 % (ref 39.0–52.0)
Hemoglobin: 12.1 g/dL — ABNORMAL LOW (ref 13.0–17.0)
MCH: 25.2 pg — ABNORMAL LOW (ref 26.0–34.0)
MCHC: 31 g/dL (ref 30.0–36.0)
MCV: 81.1 fL (ref 80.0–100.0)
Platelets: 499 10*3/uL — ABNORMAL HIGH (ref 150–400)
RBC: 4.81 MIL/uL (ref 4.22–5.81)
RDW: 13.1 % (ref 11.5–15.5)
WBC: 11.4 10*3/uL — ABNORMAL HIGH (ref 4.0–10.5)
nRBC: 0 % (ref 0.0–0.2)

## 2018-03-13 LAB — HEPARIN LEVEL (UNFRACTIONATED)
HEPARIN UNFRACTIONATED: 0.67 [IU]/mL (ref 0.30–0.70)
Heparin Unfractionated: 0.73 IU/mL — ABNORMAL HIGH (ref 0.30–0.70)
Heparin Unfractionated: 0.48 IU/mL (ref 0.30–0.70)

## 2018-03-13 MED ORDER — IOHEXOL 300 MG/ML  SOLN
75.0000 mL | Freq: Once | INTRAMUSCULAR | Status: AC | PRN
  Administered 2018-03-13: 100 mL via INTRAVENOUS

## 2018-03-13 MED ORDER — DM-GUAIFENESIN ER 30-600 MG PO TB12
1.0000 | ORAL_TABLET | Freq: Two times a day (BID) | ORAL | Status: DC
  Administered 2018-03-13 – 2018-03-19 (×13): 1 via ORAL
  Filled 2018-03-13 (×16): qty 1

## 2018-03-13 NOTE — Progress Notes (Signed)
ANTICOAGULATION CONSULT NOTE - Follow Up Consult  Pharmacy Consult for Heparin  Indication: pulmonary embolus and DVT  No Known Allergies  Patient Measurements: Height: 5' 5.98" (167.6 cm) Weight: 116 lb 10 oz (52.9 kg) IBW/kg (Calculated) : 63.76  Vital Signs: Temp: 99.4 F (37.4 C) (12/05 1615) Temp Source: Oral (12/05 1615) BP: 126/65 (12/05 1615) Pulse Rate: 90 (12/05 1615)  Labs: Recent Labs    03/11/18 0543 03/12/18 0530 03/13/18 0015 03/13/18 0923 03/13/18 1658  HGB 11.3* 11.4*  --  12.1*  --   HCT 37.2* 37.4*  --  39.0  --   PLT 471* 451*  --  499*  --   HEPARINUNFRC  --   --  0.67 0.73* 0.48  CREATININE 0.81 0.91  --   --   --     Estimated Creatinine Clearance: 96.1 mL/min (by C-G formula based on SCr of 0.91 mg/dL).   Assessment: 22yo male s/p left VATS/biopsy. Small PE noted prior to surgery but CVTS did not feel that it warranted full anticoagulation. Now s/p dopplers with LUE DVT.   D/w anticoagulation with surgery rounding on unit, will defer apixaban for now and start conservative heparin infusion without bolus this afternoon. Chest tube removed this am ~0930. Will aim for lower goal today and if stable overnight on anticoagulation will broaden range.   12/5 pm update: heparin level at goal at 0.48, no adjustments for now.  Goal of Therapy:  Heparin level 0.3-0.5 units/ml Monitor platelets by anticoagulation protocol: Yes   Plan:  Continue heparin at 1150 units/hr Recheck with morning labs  Arrie Senate, PharmD, BCPS Clinical Pharmacist [phone] Please check AMION for all [place] numbers 03/13/2018

## 2018-03-13 NOTE — Progress Notes (Signed)
ANTICOAGULATION CONSULT NOTE - Follow Up Consult  Pharmacy Consult for Heparin  Indication: pulmonary embolus and DVT  No Known Allergies  Patient Measurements: Height: 5' 5.98" (167.6 cm) Weight: 116 lb 10 oz (52.9 kg) IBW/kg (Calculated) : 63.76  Vital Signs: Temp: 97.6 F (36.4 C) (12/05 0818) Temp Source: Oral (12/05 0818) BP: 123/64 (12/05 0818) Pulse Rate: 99 (12/05 0818)  Labs: Recent Labs    03/11/18 0543 03/12/18 0530 03/13/18 0015 03/13/18 0923  HGB 11.3* 11.4*  --  12.1*  HCT 37.2* 37.4*  --  39.0  PLT 471* 451*  --  499*  HEPARINUNFRC  --   --  0.67 0.73*  CREATININE 0.81 0.91  --   --     Estimated Creatinine Clearance: 96.1 mL/min (by C-G formula based on SCr of 0.91 mg/dL).   Assessment: 22yo male s/p left VATS/biopsy. Small PE noted prior to surgery but CVTS did not feel that it warranted full anticoagulation. Now s/p dopplers with LUE DVT.   D/w anticoagulation with surgery rounding on unit, will defer apixaban for now and start conservative heparin infusion without bolus this afternoon. Chest tube removed this am ~0930. Will aim for lower goal today and if stable overnight on anticoagulation will broaden range.   12/5 AM update: heparin level a little above goal, no issues per RN.   Goal of Therapy:  Heparin level 0.3-0.5 units/ml Monitor platelets by anticoagulation protocol: Yes   Plan:  Decrease heparin to 1150 units/hr Re-check heparin level in 6 hours Monitor for bleeding Plan for transition to apixaban 12/7  Erin Hearing PharmD., BCPS Clinical Pharmacist 03/13/2018 10:23 AM

## 2018-03-13 NOTE — Progress Notes (Addendum)
      [address]       [place] [address]             [phone]       3 Days Post-Op Procedure(s) (LRB): VIDEO BRONCHOSCOPY (Left) MEDIASTINOSCOPY (N/A) VIDEO ASSISTED THORACOSCOPY (VATS)/ BIOSPY ANTERIOR APPROACH (Left)  Subjective: Patient just waking up this am. He is asking if there are any final pathology results.   Objective: Vital signs in last 24 hours: Temp:  [97.5 F (36.4 C)-99.5 F (37.5 C)] 99.5 F (37.5 C) (12/05 0400) Pulse Rate:  [61-97] 81 (12/05 0400) Cardiac Rhythm: Normal sinus rhythm (12/05 0400) Resp:  [16-30] 23 (12/05 0400) BP: (109-119)/(60-67) 109/60 (12/05 0400) SpO2:  [95 %-100 %] 95 % (12/05 0400)      Intake/Output from previous day: 12/04 0701 - 12/05 0700 In: 257.5 [P.O.:120; I.V.:137.5] Out: 56 [Chest Tube:80]   Physical Exam:  Cardiovascular: RRR Pulmonary: Clear to auscultation on the right and very diminished on the elft Abdomen: Soft, non tender, bowel sounds present. Wounds: Wounds are clean and dry.  No erythema or signs of infection. Slight bloody drainage from chest tube wound. Extremities-+ swelling LUE  Lab Results: CBC: Recent Labs    03/11/18 0543 03/12/18 0530  WBC 11.5* 11.8*  HGB 11.3* 11.4*  HCT 37.2* 37.4*  PLT 471* 451*   BMET:  Recent Labs    03/11/18 0543 03/12/18 0530  NA 137 138  K 4.3 4.2  CL 103 103  CO2 24 28  GLUCOSE 117* 108*  BUN 16 17  CREATININE 0.81 0.91  CALCIUM 8.6* 8.6*    PT/INR:  No results for input(s): LABPROT, INR in the last 72 hours. ABG:  INR: Will add last result for INR, ABG once components are confirmed Will add last 4 CBG results once components are confirmed  Assessment/Plan: 1. CV-SB/SR in the 80's this am. 2. Pulmonary-On room air.   CXR this am appears stable (shows left apical space, left pleural effusion and near opacification of the left lung-mostly mass).  Encourage incentive spirometer. 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 Await final pathology. 3. ABL anemia-H and H stable at 11.4 and 37.4 this am 4. Small LUL PE-per Dr. Prescott Gum, no more Heparin drip. On Lovenox 5. Thrombocytosis-last platelets 451,000 6. Left IJ and subclavian DVT-on Heparin drip  Donielle M ZimmermanPA-C 03/13/2018,7:08 AM [phone]  transition to oral Eliquis POD#5 12-7 CXR ok after chest tube removal patient examined and medical record reviewed,agree with above note. Tharon Aquas Trigt III 03/13/2018

## 2018-03-13 NOTE — Care Management Note (Signed)
Case Management Note  Patient Details  Name: Nicholas Moreno MRN: [id number] Date of Birth: 1995/08/17  Subjective/Objective:     Pt admitted with spontaneous Pnemo               Action/Plan:   PTA pt was attending a local college - parents are his support system.  Pt has active insurance and sees the PCP at the college - pt will need close follow up at discharge - modalities to be considered closer to discharge   Expected Discharge Date:                  Expected Discharge Plan:     In-House Referral:  Clinical Social Work  Discharge planning Services  CM Consult  Post Acute Care Choice:    Choice offered to:     DME Arranged:    DME Agency:     HH Arranged:    [place] Agency:     Status of Service:  In process, will continue to follow  If discussed at Long Length of Stay Meetings, dates discussed:    Additional Comments: Pt is now s/p VATS - suspected lymphoma, on IV heparin drip Maryclare Labrador, RN 03/13/2018, 4:01 PM

## 2018-03-13 NOTE — Progress Notes (Signed)
Pt received oral contrast for CT of Abd & Pelvis.at 1430 and he is drinking second dose of oral contrast now. HS [employer]

## 2018-03-13 NOTE — Progress Notes (Addendum)
PROGRESS NOTE    Zakk Borgen  OFB:[id number] DOB: 12-Oct-1995 DOA: 03/07/2018 PCP: Patient, No Pcp Per   Brief Narrative: Patient is a 22 year old filipino male who presented with SOB.Work up revealed large mediastinal mass ,left pleural effusion and left pulmonary embolism.  Started on heparin drip and underwent left sided thoracentesis by PCCM. Oncology consulted.Underwent mediastinal mass biopsy  by CT surgery through VATS procedure.S/P chest tube placement and chest removal. Biopsy pending.Also found to have left IJ and subclavian vein DVT.  On heparin drip currently.    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  LDH elevated. He underwent VATS procedure with biopsy the mediastinal mass and also biopsy of the right lowerr lobe and left upper lobe masses.S/P chest tube placement.  Oncology following. Also started on high-dose dexamethasone and he completed the three days course. AFP tumor marker negative.Will follow-up histopathology report,still pending. Last CXR showed left apical pneumothorax, large left pleural effusion and near complete opacification of the left lung. Continued medial right upper lobe mass. Continue pain management.Chest tube removed. I will check with Dr. Irene Limbo today about further management plan.  Left upper extremity DVT: Duplex revealed left IJ and subclavian DVT.  Currently on heparin drip.  Plan to start on Eliquis soon.  Superior venacava compression from mass:Completed course of  dexamethasone.  Also on Protonix.   Leukocytosis: Mild.Secondary to steroids.  We will continue to monitor.    Left pleural effusion: Status post thoracentesis.  Fluid exudative in nature most likely associated with malignancy.  Patient has persistent large left pleural  effusion with near complete opacification of the left lung.  Single subsegmental pulmonary embolism: On Heparin.  Plan to start Eliquis soon.  DVT prophylaxis:Heparin IV Code Status: Full Family Communication: Mother present at the bedside Disposition Plan: Home after full work-up.  Clearance from CT surgery and oncology   Consultants: Oncology, CT surgery, PCCM  Procedures: Paracentesis,VATS procedure.chest tube placement  Antimicrobials: None  Subjective: Patient seen and examined the bedside this morning.  Remains hemodynamically stable.  Complains of the pain on the incisional sites.  Denies any obvious chest pain or shortness of breath.  Objective: Vitals:   03/12/18 1610 03/12/18 1937 03/12/18 2353 03/13/18 0400  BP: 111/63 109/64 111/65 109/60  Pulse: 97 83 97 81  Resp: (!) 24 (!) 25 (!) 30 (!) 23  Temp: 97.8 F (36.6 C) 98.1 F (36.7 C) 98.4 F (36.9 C) 99.5 F (37.5 C)  TempSrc: Oral Oral Oral Oral  SpO2: 100% 98% 98% 95%  Weight:      Height:        Intake/Output Summary (Last 24 hours) at 03/13/2018 0759 Last data filed at 03/13/2018 0400 Gross per 24 hour  Intake 257.45 ml  Output 80 ml  Net 177.45 ml   Filed Weights   03/07/18 2000 03/08/18 0456 03/10/18 1344  Weight: 52.9 kg 52.9 kg 52.9 kg    Examination:  General exam: Not in distress,thin built HEENT:PERRL,Oral mucosa moist, Ear/Nose normal on gross exam Respiratory system:  Decreased air entry on the left side but its getting better ,clean surgical wounds on the chest Cardiovascular system: S1 & S2 heard, RRR. No JVD, murmurs, rubs, gallops or clicks. No pedal edema. Gastrointestinal system: Abdomen is nondistended, soft and nontender. No organomegaly or  masses felt. Normal bowel sounds heard. Central nervous system: Alert and oriented. No focal neurological deficits. Extremities: No edema, no clubbing ,no cyanosis, distal peripheral pulses palpable. Skin: No rashes, lesions or ulcers,no  icterus ,no pallor MSK: Normal muscle bulk,tone ,power Psychiatry: Judgement and insight appear normal. Mood & affect appropriate.     Data Reviewed: I have personally reviewed following labs and imaging studies  CBC: Recent Labs  Lab 03/07/18 1535 03/08/18 0240 03/09/18 0215 03/10/18 0143 03/10/18 0447 03/11/18 0543 03/12/18 0530  WBC 7.3 8.2 6.8 17.5* 16.3* 11.5* 11.8*  NEUTROABS 5.3 6.1 6.1 15.8* 14.7*  --   --   HGB 14.1 12.1* 12.2* 12.0* 12.8* 11.3* 11.4*  HCT 45.6 38.0* 39.8 38.3* 40.2 37.2* 37.4*  MCV 82.2 80.2 80.2 79.3* 79.4* 81.6 81.1  PLT 319 457* 534* 597* 550* 471* 573*   Basic Metabolic Panel: Recent Labs  Lab 03/07/18 1535 03/09/18 0215 03/10/18 0447 03/11/18 0543 03/12/18 0530  NA 135 133* 139 137 138  K 4.2 3.9 4.0 4.3 4.2  CL 98 98 102 103 103  CO2 27 22 31 24 28   GLUCOSE 87 213* 107* 117* 108*  BUN 7 8 9 16 17   CREATININE 0.96 0.77 0.67 0.81 0.91  CALCIUM 8.7* 8.7* 8.8* 8.6* 8.6*   GFR: Estimated Creatinine Clearance: 96.1 mL/min (by C-G formula based on SCr of 0.91 mg/dL). Liver Function Tests: Recent Labs  Lab 03/07/18 1927 03/08/18 1545 03/09/18 0215 03/10/18 0447 03/12/18 0530  AST 32  --  28 20 26   ALT 24  --  24 26 21   ALKPHOS 54  --  54 52 46  BILITOT 0.3  --  0.4 0.2* 0.3  PROT 6.7 6.7 6.0* 5.9* 5.1*  ALBUMIN 2.9*  --  2.4* 2.6* 2.3*   No results for input(s): LIPASE, AMYLASE in the last 168 hours. No results for input(s): AMMONIA in the last 168 hours. Coagulation Profile: Recent Labs  Lab 03/10/18 0143  INR 1.09   Cardiac Enzymes: Recent Labs  Lab 03/07/18 1540  TROPONINI <0.03   BNP (last 3 results) No results for input(s): PROBNP in the last 8760 hours. HbA1C: No results for input(s): HGBA1C in the last 72 hours. CBG: Recent Labs  Lab 03/10/18 2135 03/10/18 2322 03/11/18 0315 03/11/18 0512 03/11/18 1129  GLUCAP 118* 111* 114* 117* 122*   Lipid Profile: No results for input(s): CHOL, HDL, LDLCALC,  TRIG, CHOLHDL, LDLDIRECT in the last 72 hours. Thyroid Function Tests: No results for input(s): TSH, T4TOTAL, FREET4, T3FREE, THYROIDAB in the last 72 hours. Anemia Panel: No results for input(s): VITAMINB12, FOLATE, FERRITIN, TIBC, IRON, RETICCTPCT in the last 72 hours. Sepsis Labs: No results for input(s): PROCALCITON, LATICACIDVEN in the last 168 hours.  Recent Results (from the past 240 hour(s))  MRSA PCR Screening     Status: None   Collection Time: 03/07/18  7:58 PM  Result Value Ref Range Status   MRSA by PCR NEGATIVE NEGATIVE Final    Comment:        The GeneXpert MRSA Assay (FDA approved for NASAL specimens only), is one component of a comprehensive MRSA colonization surveillance program. It is not intended to diagnose MRSA infection nor to guide or monitor treatment for MRSA infections. Performed at [facility], [place] [address] [place], [place] [address]   Body fluid culture     Status: None   Collection Time: 03/08/18  3:24 PM  Result Value Ref Range Status   Specimen Description PLEURAL  LEFT  Final   Special Requests NONE  Final   Gram Stain   Final    ABUNDANT WBC PRESENT, PREDOMINANTLY MONONUCLEAR NO ORGANISMS SEEN    Culture   Final    NO GROWTH 3 DAYS Performed at [facility], [place] [address], [place], [place] [address]    Report Status 03/11/2018 FINAL  Final  Surgical pcr screen     Status: Abnormal   Collection Time: 03/09/18  1:43 PM  Result Value Ref Range Status   MRSA, PCR NEGATIVE NEGATIVE Final   Staphylococcus aureus POSITIVE (A) NEGATIVE Final    Comment: (NOTE) The Xpert SA Assay (FDA approved for NASAL specimens in patients 15 years of age and older), is one component of a comprehensive surveillance program. It is not intended to diagnose infection nor to guide or monitor treatment.   Anaerobic culture     Status: None (Preliminary result)   Collection Time: 03/10/18  4:09 PM  Result Value Ref Range Status   Specimen  Description BRONCHIAL WASHINGS LEFT  Final   Special Requests   Final    NONE Performed at [facility], [address] [place], [place] [address]    Culture   Final    NO ANAEROBES ISOLATED; CULTURE IN PROGRESS FOR 5 DAYS   Report Status PENDING  Incomplete  Culture, fungus without smear     Status: None (Preliminary result)   Collection Time: 03/10/18  4:09 PM  Result Value Ref Range Status   Specimen Description BRONCHIAL WASHINGS LEFT  Final   Special Requests NONE  Final   Culture   Final    NO FUNGUS ISOLATED AFTER 2 DAYS Performed at [facility], [place] [address] [place], [place] [address]    Report Status PENDING  Incomplete  Acid Fast Smear (AFB)     Status: None   Collection Time: 03/10/18  4:09 PM  Result Value Ref Range Status   AFB Specimen Processing Concentration  Final   Acid Fast Smear Negative  Final    Comment: (NOTE) Performed At: [facility] [place], [place] [id number] Rush Farmer MD VO:[id number]    Source (AFB) BRONCHIAL WASHINGS  Final    Comment: LEFT Performed at [facility], [place] [address], [place], [place] [address]   Culture, respiratory     Status: None   Collection Time: 03/10/18  4:09 PM  Result Value Ref Range Status   Specimen Description BRONCHIAL WASHINGS LEFT  Final   Special Requests NONE  Final   Gram Stain   Final    FEW WBC PRESENT, PREDOMINANTLY PMN NO ORGANISMS SEEN    Culture   Final    RARE Consistent with normal respiratory flora. Performed at [facility], [place] [address] [place], [place] [address]    Report Status 03/12/2018 FINAL  Final         Radiology Studies: Dg Chest Port 1 View  Result Date: 03/12/2018 CLINICAL DATA:  22 year old male with mediastinal mass, status post VATS and biopsy with mediastinoscopy 05/12/2017, suspected lymphoma. EXAM: PORTABLE CHEST 1 VIEW COMPARISON:  Chest x-ray 03/11/2018, 03/10/2018, 03/08/2018, CT 03/07/2018 FINDINGS:  Cardiomediastinal silhouette partially obscured by lung/pleural disease/mediastinal disease, unchanged. In the interval there has been development of left apical pneumothorax component. Opacity at the left base obscuring the left costophrenic angle in the left heart border. Unchanged left thoracostomy tube. Right IJ central venous catheter, unchanged. No right-sided confluent airspace disease. No  pleural effusion or pneumothorax. IMPRESSION: Interval development of left apical pneumothorax, either ex vacuo, or representing air leak. Unchanged position of thoracostomy tube. Unchanged right IJ central venous catheter. Similar appearance of cardiomediastinal silhouette, with obscuration of the heart borders and predominantly left lung by known mediastinal mass and left lung consolidation, status post VATS and mediastinoscopy. These results were called by telephone at the time of interpretation on 03/12/2018 at 10:06 am to nurse caring for patient, Ms Lilia Pro in 4 Heart. Electronically Signed   By: Corrie Mckusick D.O.   On: 03/12/2018 10:07   Vas [place] Upper Extremity Venous Duplex  Result Date: 03/12/2018 UPPER VENOUS STUDY  Indications: Edema, and SVC syndrome, chest mass Performing Technologist: Landry Mellow RDMS, RVT  Examination Guidelines: A complete evaluation includes B-mode imaging, spectral Doppler, color Doppler, and power Doppler as needed of all accessible portions of each vessel. Bilateral testing is considered an integral part of a complete examination. Limited examinations for reoccurring indications may be performed as noted.  Right Findings: +----------+------------+----------+---------+-----------+---------------------+ RIGHT     CompressiblePropertiesPhasicitySpontaneous       Summary        +----------+------------+----------+---------+-----------+---------------------+ IJV                                                  unable to image due                                                            to IJ line       +----------+------------+----------+---------+-----------+---------------------+ Subclavian    Full                 Yes       Yes                          +----------+------------+----------+---------+-----------+---------------------+ Axillary      Full                 Yes       Yes                          +----------+------------+----------+---------+-----------+---------------------+ Brachial      Full                 Yes       Yes                          +----------+------------+----------+---------+-----------+---------------------+ Radial        Full                 Yes       Yes                          +----------+------------+----------+---------+-----------+---------------------+ Ulnar         Full                                                        +----------+------------+----------+---------+-----------+---------------------+  Cephalic      Full                                                        +----------+------------+----------+---------+-----------+---------------------+ Basilic       Full                                                        +----------+------------+----------+---------+-----------+---------------------+  Left Findings: +----------+------------+----------+---------+-----------+-------+ LEFT      CompressiblePropertiesPhasicitySpontaneousSummary +----------+------------+----------+---------+-----------+-------+ IJV         Partial                Yes       Yes     Acute  +----------+------------+----------+---------+-----------+-------+ Subclavian  Partial                Yes       Yes     Acute  +----------+------------+----------+---------+-----------+-------+ Axillary      Full                 Yes       Yes            +----------+------------+----------+---------+-----------+-------+ Brachial      Full                 Yes       Yes             +----------+------------+----------+---------+-----------+-------+ Radial        Full                                          +----------+------------+----------+---------+-----------+-------+ Ulnar         Full                                          +----------+------------+----------+---------+-----------+-------+ Cephalic      Full                                          +----------+------------+----------+---------+-----------+-------+ Basilic       Full                                          +----------+------------+----------+---------+-----------+-------+  Summary:  Right: No evidence of deep vein thrombosis in the upper extremity. No evidence of superficial vein thrombosis in the upper extremity.  Left: No evidence of superficial vein thrombosis in the upper extremity. Findings consistent with acute deep vein thrombosis involving the left internal jugular veins and left subclavian veins.  *See table(s) above for measurements and observations.  Diagnosing physician: Quay Burow MD Electronically signed by Quay Burow MD on 03/12/2018 at 4:53:40 PM.    Final         Scheduled Meds: . acetaminophen  1,000 mg Oral Q6H  Or  . acetaminophen (TYLENOL) oral liquid 160 mg/5 mL  1,000 mg Oral Q6H  . allopurinol  100 mg Oral BID  . bisacodyl  10 mg Oral Daily  . Chlorhexidine Gluconate Cloth  6 each Topical Daily  . feeding supplement (ENSURE ENLIVE)  237 mL Oral BID BM  . multivitamin with minerals  1 tablet Oral Daily  . mupirocin ointment  1 application Nasal BID  . pantoprazole  40 mg Oral Daily  . polyethylene glycol  17 g Oral Daily   Continuous Infusions: . 0.9 % NaCl with KCl 20 mEq / L Stopped (03/11/18 1826)  . heparin 1,350 Units/hr (03/13/18 0101)  . potassium chloride       LOS: 6 days    Time spent: 25 mins.More than 50% of that time was spent in counseling and/or coordination of care.      Shelly Coss, MD [facility] Pager [phone]  If 7PM-7AM, please contact night-coverage [email or web address] Password [email or web address] 03/13/2018, 7:59 AM

## 2018-03-13 NOTE — Progress Notes (Signed)
ANTICOAGULATION CONSULT NOTE - Follow Up Consult  Pharmacy Consult for Heparin  Indication: pulmonary embolus and DVT  No Known Allergies  Patient Measurements: Height: 5' 5.98" (167.6 cm) Weight: 116 lb 10 oz (52.9 kg) IBW/kg (Calculated) : 63.76  Vital Signs: Temp: 98.4 F (36.9 C) (12/04 2353) Temp Source: Oral (12/04 2353) BP: 111/65 (12/04 2353) Pulse Rate: 97 (12/04 2353)  Labs: Recent Labs    03/10/18 0143 03/10/18 0447 03/11/18 0543 03/12/18 0530 03/13/18 0015  HGB 12.0* 12.8* 11.3* 11.4*  --   HCT 38.3* 40.2 37.2* 37.4*  --   PLT 597* 550* 471* 451*  --   LABPROT 14.0  --   --   --   --   INR 1.09  --   --   --   --   HEPARINUNFRC 0.53  --   --   --  0.67  CREATININE  --  0.67 0.81 0.91  --     Estimated Creatinine Clearance: 96.1 mL/min (by C-G formula based on SCr of 0.91 mg/dL).   Assessment: 22yo male s/p left VATS/biopsy. Small PE noted prior to surgery but CVTS did not feel that it warranted full anticoagulation. Now s/p dopplers with LUE DVT.   D/w anticoagulation with surgery rounding on unit, will defer apixaban for now and start conservative heparin infusion without bolus this afternoon. Chest tube removed this am ~0930. Will aim for lower goal today and if stable overnight on anticoagulation will broaden range.   12/5 AM update: heparin level a little above goal, no issues per RN.   Goal of Therapy:  Heparin level 0.3-0.5 units/ml Monitor platelets by anticoagulation protocol: Yes   Plan:  Decrease heparin slightly to 1350 units/hr Re-check heparin level in 6-8 hours Monitor for bleeding  [name] 03/13/2018,12:58 AM

## 2018-03-13 NOTE — Progress Notes (Signed)
  Echocardiogram 2D Echocardiogram has been performed.  Nicholas Moreno 03/13/2018, 5:14 PM

## 2018-03-14 ENCOUNTER — Inpatient Hospital Stay (HOSPITAL_COMMUNITY): Payer: BLUE CROSS/BLUE SHIELD

## 2018-03-14 ENCOUNTER — Inpatient Hospital Stay: Payer: Self-pay

## 2018-03-14 ENCOUNTER — Other Ambulatory Visit: Payer: Self-pay | Admitting: Hematology

## 2018-03-14 DIAGNOSIS — Z7189 Other specified counseling: Secondary | ICD-10-CM

## 2018-03-14 DIAGNOSIS — C8528 Mediastinal (thymic) large B-cell lymphoma, lymph nodes of multiple sites: Secondary | ICD-10-CM | POA: Insufficient documentation

## 2018-03-14 LAB — URINALYSIS, ROUTINE W REFLEX MICROSCOPIC
Bilirubin Urine: NEGATIVE
GLUCOSE, UA: NEGATIVE mg/dL
Hgb urine dipstick: NEGATIVE
Ketones, ur: NEGATIVE mg/dL
Leukocytes, UA: NEGATIVE
Nitrite: NEGATIVE
PROTEIN: NEGATIVE mg/dL
Specific Gravity, Urine: 1.006 (ref 1.005–1.030)
pH: 7 (ref 5.0–8.0)

## 2018-03-14 LAB — CBC WITH DIFFERENTIAL/PLATELET
Abs Immature Granulocytes: 0.07 10*3/uL (ref 0.00–0.07)
Basophils Absolute: 0 10*3/uL (ref 0.0–0.1)
Basophils Relative: 0 %
EOS PCT: 2 %
Eosinophils Absolute: 0.2 10*3/uL (ref 0.0–0.5)
HCT: 36.6 % — ABNORMAL LOW (ref 39.0–52.0)
Hemoglobin: 11.5 g/dL — ABNORMAL LOW (ref 13.0–17.0)
Immature Granulocytes: 1 %
Lymphocytes Relative: 5 %
Lymphs Abs: 0.5 10*3/uL — ABNORMAL LOW (ref 0.7–4.0)
MCH: 25.5 pg — AB (ref 26.0–34.0)
MCHC: 31.4 g/dL (ref 30.0–36.0)
MCV: 81.2 fL (ref 80.0–100.0)
MONO ABS: 0.6 10*3/uL (ref 0.1–1.0)
Monocytes Relative: 6 %
Neutro Abs: 8.6 10*3/uL — ABNORMAL HIGH (ref 1.7–7.7)
Neutrophils Relative %: 86 %
Platelets: 435 10*3/uL — ABNORMAL HIGH (ref 150–400)
RBC: 4.51 MIL/uL (ref 4.22–5.81)
RDW: 13.6 % (ref 11.5–15.5)
WBC: 10 10*3/uL (ref 4.0–10.5)
nRBC: 0 % (ref 0.0–0.2)

## 2018-03-14 LAB — BASIC METABOLIC PANEL
Anion gap: 10 (ref 5–15)
BUN: 9 mg/dL (ref 6–20)
CO2: 27 mmol/L (ref 22–32)
Calcium: 8.4 mg/dL — ABNORMAL LOW (ref 8.9–10.3)
Chloride: 102 mmol/L (ref 98–111)
Creatinine, Ser: 0.82 mg/dL (ref 0.61–1.24)
GFR calc non Af Amer: >60 mL/min (ref >60)
Glucose, Bld: 95 mg/dL (ref 70–99)
Potassium: 3.9 mmol/L (ref 3.5–5.1)
Sodium: 139 mmol/L (ref 135–145)

## 2018-03-14 LAB — HEPARIN LEVEL (UNFRACTIONATED): Heparin Unfractionated: 0.38 IU/mL (ref 0.30–0.70)

## 2018-03-14 MED ORDER — APIXABAN 5 MG PO TABS
10.0000 mg | ORAL_TABLET | Freq: Two times a day (BID) | ORAL | Status: DC
  Administered 2018-03-14 – 2018-03-15 (×2): 10 mg via ORAL
  Filled 2018-03-14 (×2): qty 2

## 2018-03-14 MED ORDER — SODIUM CHLORIDE 0.9 % IV SOLN
1.0000 g | Freq: Three times a day (TID) | INTRAVENOUS | Status: DC
  Administered 2018-03-14 – 2018-03-15 (×3): 1 g via INTRAVENOUS
  Filled 2018-03-14 (×4): qty 1

## 2018-03-14 MED ORDER — APIXABAN 5 MG PO TABS: 5.0000 mg | ORAL_TABLET | Freq: Two times a day (BID) | ORAL | Status: DC

## 2018-03-14 MED ORDER — APIXABAN 5 MG PO TABS
5.0000 mg | ORAL_TABLET | Freq: Once | ORAL | Status: AC
  Administered 2018-03-14: 5 mg via ORAL
  Filled 2018-03-14: qty 1

## 2018-03-14 MED ORDER — VANCOMYCIN HCL 10 G IV SOLR
1250.0000 mg | Freq: Once | INTRAVENOUS | Status: AC
  Administered 2018-03-14: 1250 mg via INTRAVENOUS
  Filled 2018-03-14: qty 1250

## 2018-03-14 MED ORDER — VANCOMYCIN HCL IN DEXTROSE 750-5 MG/150ML-% IV SOLN
750.0000 mg | Freq: Two times a day (BID) | INTRAVENOUS | Status: DC
  Administered 2018-03-14 – 2018-03-15 (×2): 750 mg via INTRAVENOUS
  Filled 2018-03-14 (×3): qty 150

## 2018-03-14 NOTE — Progress Notes (Signed)
START ON PATHWAY REGIMEN - Lymphoma and CLL     A cycle is every 21 days:     Prednisone      Rituximab      Etoposide      Doxorubicin      Vincristine      Cyclophosphamide      Filgrastim-xxxx   **Always confirm dose/schedule in your pharmacy ordering system**  Patient Characteristics: Primary Mediastinal Large B-Cell Lymphoma, First Line Disease Type: Not Applicable Disease Type: Primary Mediastinal Large B-Cell Lymphoma Disease Type: Not Applicable Line of Therapy: First Line [place] Stage: IIE Intent of Therapy: Curative Intent, Discussed with Patient

## 2018-03-14 NOTE — Progress Notes (Signed)
Report to given to Allegra Lai on 6E at [facility].  [facility] here to transport patient.  Tilda Burrow [facility] 03/14/2018 5:26 PM

## 2018-03-14 NOTE — Progress Notes (Signed)
Pt has a temp of 102.8 oral. MD paged. Tylenol give per orders. Will continue to monitor pt.

## 2018-03-14 NOTE — Progress Notes (Signed)
ANTICOAGULATION CONSULT NOTE - Follow Up Consult  Pharmacy Consult for Heparin > convert to Eliquis Indication: pulmonary embolus and DVT  No Known Allergies  Patient Measurements: Height: 5' 5.98" (167.6 cm) Weight: 116 lb 10 oz (52.9 kg) IBW/kg (Calculated) : 63.76  Vital Signs: Temp: 98 F (36.7 C) (12/06 0520) Temp Source: Oral (12/06 0520) BP: 114/66 (12/06 0317) Pulse Rate: 93 (12/06 0317)  Labs: Recent Labs    03/12/18 0530  03/13/18 0923 03/13/18 1658 03/14/18 0500  HGB 11.4*  --  12.1*  --  11.5*  HCT 37.4*  --  39.0  --  36.6*  PLT 451*  --  499*  --  435*  HEPARINUNFRC  --    < > 0.73* 0.48 0.38  CREATININE 0.91  --   --   --  0.82   < > = values in this interval not displayed.    Estimated Creatinine Clearance: 106.6 mL/min (by C-G formula based on SCr of 0.82 mg/dL).   Assessment: 22yo male s/p left VATS/biopsy. Small PE noted prior to surgery but CVTS did not feel that it warranted full anticoagulation. Now s/p dopplers with LUE DVT.   Heparin level within goal range this morning.  No overt bleeding or complications noted.  Per discussion with Dr. Prescott Gum, okay to start Eliquis this morning.  Goal of Therapy:  Heparin level 0.3-0.5 units/ml Monitor platelets by anticoagulation protocol: Yes   Plan:  D/c IV heparin Give Eliquis 5 mg po x 1 (per Dr. Prescott Gum, would like lower AM dose since recent surgery) Then start treatment dose of Eliquis 10 mg BID x 7 days, followed by Eliquis 5 mg BID. Will need Eliquis education prior to discharge.  Marguerite Olea, [facility] Clinical Pharmacist Phone [phone]  03/14/2018 9:42 AM

## 2018-03-14 NOTE — Progress Notes (Signed)
Pharmacy Antibiotic Note  Nicholas Moreno is a 22 y.o. male admitted on 03/07/2018 with mediastinal mass.  Pharmacy has been consulted for Vancomycin/Cefepime dosing for possible HCAP. WBC ok, renal function good.   Plan: Vancomycin 1250 mg IV x 1, then 750 mg IV q12h -Estimated AUC: 468 Cefepime 1g IV q8h Trend WBC, temp, renal function  F/U infectious work-up Drug levels as indicated   Height: 5' 5.98" (167.6 cm) Weight: 116 lb 10 oz (52.9 kg) IBW/kg (Calculated) : 63.76  Temp (24hrs), Avg:99.3 F (37.4 C), Min:97.6 F (36.4 C), Max:102.9 F (39.4 C)  Recent Labs  Lab 03/09/18 0215  03/10/18 0447 03/11/18 0543 03/12/18 0530 03/13/18 0923 03/14/18 0500  WBC 6.8   < > 16.3* 11.5* 11.8* 11.4* 10.0  CREATININE 0.77  --  0.67 0.81 0.91  --  0.82   < > = values in this interval not displayed.    Estimated Creatinine Clearance: 106.6 mL/min (by C-G formula based on SCr of 0.82 mg/dL).    No Known Allergies   Nicholas Moreno 03/14/2018 7:56 AM

## 2018-03-14 NOTE — Progress Notes (Addendum)
      [address]       [place] [address]             [phone]       4 Days Post-Op Procedure(s) (LRB): VIDEO BRONCHOSCOPY (Left) MEDIASTINOSCOPY (N/A) VIDEO ASSISTED THORACOSCOPY (VATS)/ BIOSPY ANTERIOR APPROACH (Left)  Subjective: Patient just waking up this am. He denies burning upon urination or flank pain (CT done yesterday suggestive of possible pyelonephritis)   Objective: Vital signs in last 24 hours: Temp:  [97.6 F (36.4 C)-102.9 F (39.4 C)] (P) 98 F (36.7 C) (12/06 0520) Pulse Rate:  [84-117] 93 (12/06 0317) Cardiac Rhythm: Normal sinus rhythm (12/06 0015) Resp:  [20-28] 20 (12/06 0317) BP: (114-132)/(64-75) 114/66 (12/06 0317) SpO2:  [94 %-100 %] 98 % (12/06 0317)      Intake/Output from previous day: 12/05 0701 - 12/06 0700 In: 1199.6 [P.O.:960; I.V.:239.6] Out: -    Physical Exam:  Cardiovascular: RRR Pulmonary: Clear to auscultation on the right and very diminished on the elft Abdomen: Soft, non tender, bowel sounds present. Wounds: Wounds are clean and dry.  No erythema or signs of infection. Slight bloody drainage from chest tube wound. Extremities-+ swelling LUE;No LE edema  Lab Results: CBC: Recent Labs    03/13/18 0923 03/14/18 0500  WBC 11.4* 10.0  HGB 12.1* 11.5*  HCT 39.0 36.6*  PLT 499* 435*   BMET:  Recent Labs    03/12/18 0530 03/14/18 0500  NA 138 139  K 4.2 3.9  CL 103 102  CO2 28 27  GLUCOSE 108* 95  BUN 17 9  CREATININE 0.91 0.82  CALCIUM 8.6* 8.4*    PT/INR:  No results for input(s): LABPROT, INR in the last 72 hours. ABG:  INR: Will add last result for INR, ABG once components are confirmed Will add last 4 CBG results once components are confirmed  Assessment/Plan: 1. CV-SB/SR in the 90's this am. Echo done yesterday showed LVEF 55-60%, mild MR and TR, mild pericardial effusin posteriorly. 2. Pulmonary-On room air.   CXR this am appears stable (shows left apical space, left pleural  effusion and near opacification of the left lung-mostly mass).  Encourage incentive spirometer. Regarding pathology, frozen section of piece of tumor - Grade B cell  lymphoma.  3. Anemia-H and H stable at 11.5 and 36.6 this am 4. Fever to 102.8-WBC WNL and no sign of wound infection. CT abdomen and pelvis done yesterday showed bilateral pleural effusions L>R,No lymphadenopathy in the abdomen or pelvis, patchy perfusion abnormalities involving both kidneys most typically seen with pyelonephritis (ua not checked yet). Has also had central line since surgery. Will check blood cultures and remove central line after peripheral IV 5. Thrombocytosis-last platelets 451,000 6. Left IJ and subclavian DVT-on Heparin drip. Will stop Heparin drip in am and start Apixaban  Nicholas Moreno 03/14/2018,7:10 AM [phone]   Patient has high grade B-cell lymphoma Chest tube is out DVT L axillary vein- on Eliquis I spoke with Dr Irene Limbo for coordination of care- ready to transfer to [facility] to start chemo Will contact [facility] SVC Fever last pm w/o leukocytosis- cultures sent and on empiric antibiotics Incisions clean- DC central line  patient examined and medical record reviewed,agree with above note. Nicholas Moreno 03/14/2018

## 2018-03-14 NOTE — Progress Notes (Addendum)
PROGRESS NOTE    Nicholas Moreno  HAL:[id number] DOB: 26-Sep-1995 DOA: 03/07/2018 PCP: Patient, No Pcp Per   Brief Narrative: Patient is a 22 year old filipino male who presented with SOB.Work up revealed large mediastinal mass ,left pleural effusion and left pulmonary embolism.  Started on heparin drip and underwent left sided thoracentesis by PCCM. Oncology consulted.Underwent mediastinal mass biopsy  by CT surgery through VATS procedure.S/P chest tube placement and  removal. 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  On heparin drip currently.  Had fever last night .Started on antibiotics.  Assessment & Plan:   Active Problems:   Mediastinal mass   Single subsegmental pulmonary embolism without acute cor pulmonale   Superior vena cava syndrome   S/P thoracentesis   Malnutrition of moderate degree   Lymphoma of intrathoracic lymph nodes (HCC)  High Grade B cell lymphoma/Mediastinal mass:He Underwent VATS procedure with biopsy the mediastinal mass and also biopsy of the right lower lobe and left upper lobe masses.Biopsy showed high-grade B-cell lymphoma . He is S/P chest tube placement and removal.  Oncology following. Also started on high-dose dexamethasone and he completed the three days course. AFP tumor marker negative. CXR today persistent mediastinal masses with evidence of left basilar consolidation. Continue pain management.Chest tube removed. I will check with Dr. Irene Limbo today about further management plan. CT abd/pelvis showed bilateral pleural effusions, left greater than right with left lower lobe atelectasis.   No lymphadenopathy in the abdomen or pelvis.  Fever: Febrile last night.  Had mild grade fever this morning.  Will start on antibiotics.  Will discontinue/decalate antibiotics soon.We have sent cultures.  CT abdomen/pelvis showed possible pyonephritis but patient does not have any urinary symptoms or back pain.Will  remove the central line.  Left upper extremity DVT: Duplex revealed left IJ and subclavian DVT.  Currently on heparin drip.  Plan to start on Eliquis soon.  Superior venacava compression from mass:Completed course of  dexamethasone.  Also on Protonix.   Leukocytosis: Mild.Secondary to steroids.  We will continue to monitor.    Left pleural effusion: Status post thoracentesis.  Fluid exudative in nature most likely associated with malignancy.  Patient has persistent large left pleural effusion with near complete opacification of the left lung.  Single subsegmental pulmonary embolism: On Heparin.  Plan to start Eliquis soon.  Addendum: Dr. Irene Limbo wants to start him on chemotherapy so he will be transferred to [facility] long today.  Heparin drip will be discontinued and he will be started on Eliquis.  DVT prophylaxis:Heparin IV Code Status: Full Family Communication: Mother present at the bedside Disposition Plan: Home after full work-up.  Clearance from CT surgery and oncology   Consultants: Oncology, CT surgery, PCCM  Procedures: Paracentesis,VATS procedure,chest tube placement  Antimicrobials: None  Subjective: Patient seen and examined the bedside this morning.  Remains hemodynamically stable.Has mild grade fever this morning.  Denies any obvious chest pain or shortness of breath.Cough better  Objective: Vitals:   03/13/18 2033 03/13/18 2350 03/14/18 0317 03/14/18 0520  BP:  129/67 114/66   Pulse:  91 93   Resp:  (!) 21 20   Temp: (!) 100.8 F (38.2 C) 98.8 F (37.1 C) 99.2 F (37.3 C) (P) 98 F (36.7 C)  TempSrc: Oral Oral Oral (P) Oral  SpO2:  97% 98%   Weight:      Height:        Intake/Output Summary (Last 24 hours) at 03/14/2018 [id number]  Last data filed at 03/14/2018 0000 Gross per 24 hour  Intake 1145.57 ml  Output -  Net 1145.57 ml   Filed Weights   03/07/18 2000 03/08/18 0456 03/10/18 1344  Weight: 52.9 kg 52.9 kg 52.9 kg    Examination:  General exam: Not in  distress,thin built HEENT:PERRL,Oral mucosa moist, Ear/Nose normal on gross exam Respiratory system:  Decreased air entry on the left side but improving ,clean surgical wounds on the chest Cardiovascular system: S1 & S2 heard, RRR. No JVD, murmurs, rubs, gallops or clicks. No pedal edema. Gastrointestinal system: Abdomen is nondistended, soft and nontender. No organomegaly or masses felt. Normal bowel sounds heard. Central nervous system: Alert and oriented. No focal neurological deficits. Extremities: No edema, no clubbing ,no cyanosis, distal peripheral pulses palpable. Skin: No rashes, lesions or ulcers,no icterus ,no pallor MSK: Normal muscle bulk,tone ,power Psychiatry: Judgement and insight appear normal. Mood & affect appropriate.     Data Reviewed: I have personally reviewed following labs and imaging studies  CBC: Recent Labs  Lab 03/08/18 0240 03/09/18 0215 03/10/18 0143 03/10/18 0447 03/11/18 0543 03/12/18 0530 03/13/18 0923 03/14/18 0500  WBC 8.2 6.8 17.5* 16.3* 11.5* 11.8* 11.4* 10.0  NEUTROABS 6.1 6.1 15.8* 14.7*  --   --   --  8.6*  HGB 12.1* 12.2* 12.0* 12.8* 11.3* 11.4* 12.1* 11.5*  HCT 38.0* 39.8 38.3* 40.2 37.2* 37.4* 39.0 36.6*  MCV 80.2 80.2 79.3* 79.4* 81.6 81.1 81.1 81.2  PLT 457* 534* 597* 550* 471* 451* 499* 552*   Basic Metabolic Panel: Recent Labs  Lab 03/09/18 0215 03/10/18 0447 03/11/18 0543 03/12/18 0530 03/14/18 0500  NA 133* 139 137 138 139  K 3.9 4.0 4.3 4.2 3.9  CL 98 102 103 103 102  CO2 22 31 24 28 27   GLUCOSE 213* 107* 117* 108* 95  BUN 8 9 16 17 9   CREATININE 0.77 0.67 0.81 0.91 0.82  CALCIUM 8.7* 8.8* 8.6* 8.6* 8.4*   GFR: Estimated Creatinine Clearance: 106.6 mL/min (by C-G formula based on SCr of 0.82 mg/dL). Liver Function Tests: Recent Labs  Lab 03/07/18 1927 03/08/18 1545 03/09/18 0215 03/10/18 0447 03/12/18 0530  AST 32  --  28 20 26   ALT 24  --  24 26 21   ALKPHOS 54  --  54 52 46  BILITOT 0.3  --  0.4 0.2*  0.3  PROT 6.7 6.7 6.0* 5.9* 5.1*  ALBUMIN 2.9*  --  2.4* 2.6* 2.3*   No results for input(s): LIPASE, AMYLASE in the last 168 hours. No results for input(s): AMMONIA in the last 168 hours. Coagulation Profile: Recent Labs  Lab 03/10/18 0143  INR 1.09   Cardiac Enzymes: Recent Labs  Lab 03/07/18 1540  TROPONINI <0.03   BNP (last 3 results) No results for input(s): PROBNP in the last 8760 hours. HbA1C: No results for input(s): HGBA1C in the last 72 hours. CBG: Recent Labs  Lab 03/10/18 2135 03/10/18 2322 03/11/18 0315 03/11/18 0512 03/11/18 1129  GLUCAP 118* 111* 114* 117* 122*   Lipid Profile: No results for input(s): CHOL, HDL, LDLCALC, TRIG, CHOLHDL, LDLDIRECT in the last 72 hours. Thyroid Function Tests: No results for input(s): TSH, T4TOTAL, FREET4, T3FREE, THYROIDAB in the last 72 hours. Anemia Panel: No results for input(s): VITAMINB12, FOLATE, FERRITIN, TIBC, IRON, RETICCTPCT in the last 72 hours. Sepsis Labs: No results for input(s): PROCALCITON, LATICACIDVEN in the last 168 hours.  Recent Results (from the past 240 hour(s))  MRSA PCR Screening  Status: None   Collection Time: 03/07/18  7:58 PM  Result Value Ref Range Status   MRSA by PCR NEGATIVE NEGATIVE Final    Comment:        The GeneXpert MRSA Assay (FDA approved for NASAL specimens only), is one component of a comprehensive MRSA colonization surveillance program. It is not intended to diagnose MRSA infection nor to guide or monitor treatment for MRSA infections. Performed at [facility], [place] [address], [place], [place] [address]   Body fluid culture     Status: None   Collection Time: 03/08/18  3:24 PM  Result Value Ref Range Status   Specimen Description PLEURAL LEFT  Final   Special Requests NONE  Final   Gram Stain   Final    ABUNDANT WBC PRESENT, PREDOMINANTLY MONONUCLEAR NO ORGANISMS SEEN    Culture   Final    NO GROWTH 3 DAYS Performed at [facility], [place] [address], [place], [place] [address]    Report Status 03/11/2018 FINAL  Final  Surgical pcr screen     Status: Abnormal   Collection Time: 03/09/18  1:43 PM  Result Value Ref Range Status   MRSA, PCR NEGATIVE NEGATIVE Final   Staphylococcus aureus POSITIVE (A) NEGATIVE Final    Comment: (NOTE) The Xpert SA Assay (FDA approved for NASAL specimens in patients 35 years of age and older), is one component of a comprehensive surveillance program. It is not intended to diagnose infection nor to guide or monitor treatment.   Anaerobic culture     Status: None (Preliminary result)   Collection Time: 03/10/18  4:09 PM  Result Value Ref Range Status   Specimen Description BRONCHIAL WASHINGS LEFT  Final   Special Requests   Final    NONE Performed at [facility], [address] [place], [place] [address]    Culture   Final    NO ANAEROBES ISOLATED; CULTURE IN PROGRESS FOR 5 DAYS   Report Status PENDING  Incomplete  Culture, fungus without smear     Status: None (Preliminary result)   Collection Time: 03/10/18  4:09 PM  Result Value Ref Range Status   Specimen Description BRONCHIAL WASHINGS LEFT  Final   Special Requests NONE  Final   Culture   Final    NO FUNGUS ISOLATED AFTER 3 DAYS Performed at [facility], [place] [address] [place], [place] [address]    Report Status PENDING  Incomplete  Acid Fast Smear (AFB)     Status: None   Collection Time: 03/10/18  4:09 PM  Result Value Ref Range Status   AFB Specimen Processing Concentration  Final   Acid Fast Smear Negative  Final    Comment: (NOTE) Performed At: [facility] [place], [place] [id number] Rush Farmer MD EH:[id number]    Source (AFB) BRONCHIAL WASHINGS  Final    Comment: LEFT Performed at [facility], [place] [address], [place],  [address]   Culture, respiratory     Status: None   Collection Time: 03/10/18  4:09 PM  Result Value Ref Range Status   Specimen Description  BRONCHIAL WASHINGS LEFT  Final   Special Requests NONE  Final   Gram Stain   Final    FEW WBC PRESENT, PREDOMINANTLY PMN NO ORGANISMS SEEN    Culture   Final    RARE Consistent with normal respiratory flora. Performed at [facility], [place],  [place] [address]    Report Status 03/12/2018 FINAL  Final         Radiology Studies: Dg Chest 2 View  Result Date: 03/14/2018 CLINICAL DATA:  Shortness of breath EXAM: CHEST - 2 VIEW COMPARISON:  03/13/2018 FINDINGS: Right jugular central line is again seen. Left-sided apical pneumothorax is again noted and slightly improved when compared with the prior exam. Persistent mediastinal masses are noted stable from the prior exam. Persistent small left pleural effusion is noted as well as left basilar consolidation. IMPRESSION: Slight decrease in left-sided hydropneumothorax. Persistent mediastinal masses with evidence of left basilar consolidation. Electronically Signed   By: Inez Catalina M.D.   On: 03/14/2018 07:16   Dg Chest 2 View  Result Date: 03/13/2018 CLINICAL DATA:  Follow-up LEFT hydropneumothorax after chest tube removal. EXAM: CHEST - 2 VIEW COMPARISON:  03/12/2018 dating back to 03/08/2018. CT chest 03/07/2018. FINDINGS: RIGHT jugular central venous catheter tip projects over the LOWER SVC, unchanged stable LEFT apicolateral pneumothorax since yesterday, on the order of 15% or so. Loculated LEFT pleural effusion with air-fluid levels, unchanged. Massive mediastinal mass with compression of the LEFT lung and compression of the MEDIAL RIGHT UPPER LOBE as noted on the prior CT. Slight improved aeration in the LEFT UPPER LOBE since yesterday. No new abnormalities. IMPRESSION: 1. Stable LEFT apicolateral pneumothorax and loculated LEFT hydropneumothorax after chest tube removal. 2. Slight improved aeration in the LEFT UPPER LOBE since yesterday. 3. Otherwise, no change in the massive mediastinal mass with compressive  atelectasis involving much of the LEFT lung and the MEDIAL RIGHT UPPER LOBE. 4. No new abnormalities. Electronically Signed   By: Evangeline Dakin M.D.   On: 03/13/2018 09:17   Ct Abdomen Pelvis W Contrast  Result Date: 03/13/2018 CLINICAL DATA:  Large anterior mediastinal mass suspicious for lymphoma. Status post mediastinoscopy and VATS. EXAM: CT ABDOMEN AND PELVIS WITH CONTRAST TECHNIQUE: Multidetector CT imaging of the abdomen and pelvis was performed using the standard protocol following bolus administration of intravenous contrast. CONTRAST:  123mL OMNIPAQUE IOHEXOL 300 MG/ML  SOLN COMPARISON:  Recent chest CT 03/07/2018 FINDINGS: Lower chest: Persistent left pleural effusion containing air from recent surgery. There is also air in the subcutaneous tissues of the anterior chest wall. Left lower lobe atelectasis. The heart is normal in size. The distal esophagus is grossly normal. Small right pleural effusion. The right lung base is grossly clear. Hepatobiliary: No focal hepatic lesions or intrahepatic biliary dilatation. The gallbladder is normal. No common bile duct dilatation. Pancreas: No mass, inflammation or ductal dilatation. Spleen: Normal size.  No focal lesions. Adrenals/Urinary Tract: Adrenal glands are unremarkable. Both kidneys demonstrate patchy peripheral perfusion defects typically seen with pyelonephritis. Recommend correlation with urinalysis. Stomach/Bowel: The stomach, duodenum, small bowel and colon are grossly normal. No acute inflammatory changes, mass lesions or obstructive findings. Vascular/Lymphatic: No mesenteric or retroperitoneal mass or adenopathy. The aorta and branch vessels are normal. The major venous structures are patent. Reproductive: The prostate gland and seminal vesicles are unremarkable. 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 Musculoskeletal: No significant bony findings. No worrisome bone lesions. IMPRESSION: 1. Bilateral pleural effusions, left greater than right with left lower lobe atelectasis. There is also some pleural air and subcutaneous air on the left side consistent with recent surgery. 2. No lymphadenopathy in the abdomen or pelvis. 3. Patchy perfusion abnormalities  involving both kidneys most typically seen with pyelonephritis. Recommend correlation with urinalysis. 4. Body wall edema, mesenteric edema and free pelvic fluid as above. Electronically Signed   By: Marijo Sanes M.D.   On: 03/13/2018 19:32   Vas [place] Upper Extremity Venous Duplex  Result Date: 03/12/2018 UPPER VENOUS STUDY  Indications: Edema, and SVC syndrome, chest mass Performing Technologist: Landry Mellow RDMS, RVT  Examination Guidelines: A complete evaluation includes B-mode imaging, spectral Doppler, color Doppler, and power Doppler as needed of all accessible portions of each vessel. Bilateral testing is considered an integral part of a complete examination. Limited examinations for reoccurring indications may be performed as noted.  Right Findings: +----------+------------+----------+---------+-----------+---------------------+ RIGHT     CompressiblePropertiesPhasicitySpontaneous       Summary        +----------+------------+----------+---------+-----------+---------------------+ IJV                                                  unable to image due                                                           to IJ line       +----------+------------+----------+---------+-----------+---------------------+ Subclavian    Full                 Yes       Yes                          +----------+------------+----------+---------+-----------+---------------------+ Axillary      Full                 Yes       Yes                          +----------+------------+----------+---------+-----------+---------------------+  Brachial      Full                 Yes       Yes                          +----------+------------+----------+---------+-----------+---------------------+ Radial        Full                 Yes       Yes                          +----------+------------+----------+---------+-----------+---------------------+ Ulnar         Full                                                        +----------+------------+----------+---------+-----------+---------------------+ Cephalic      Full                                                        +----------+------------+----------+---------+-----------+---------------------+  Basilic       Full                                                        +----------+------------+----------+---------+-----------+---------------------+  Left Findings: +----------+------------+----------+---------+-----------+-------+ LEFT      CompressiblePropertiesPhasicitySpontaneousSummary +----------+------------+----------+---------+-----------+-------+ IJV         Partial                Yes       Yes     Acute  +----------+------------+----------+---------+-----------+-------+ Subclavian  Partial                Yes       Yes     Acute  +----------+------------+----------+---------+-----------+-------+ Axillary      Full                 Yes       Yes            +----------+------------+----------+---------+-----------+-------+ Brachial      Full                 Yes       Yes            +----------+------------+----------+---------+-----------+-------+ Radial        Full                                          +----------+------------+----------+---------+-----------+-------+ Ulnar         Full                                          +----------+------------+----------+---------+-----------+-------+ Cephalic      Full                                           +----------+------------+----------+---------+-----------+-------+ Basilic       Full                                          +----------+------------+----------+---------+-----------+-------+  Summary:  Right: No evidence of deep vein thrombosis in the upper extremity. No evidence of superficial vein thrombosis in the upper extremity.  Left: No evidence of superficial vein thrombosis in the upper extremity. Findings consistent with acute deep vein thrombosis involving the left internal jugular veins and left subclavian veins.  *See table(s) above for measurements and observations.  Diagnosing physician: Quay Burow MD Electronically signed by Quay Burow MD on 03/12/2018 at 4:53:40 PM.    Final         Scheduled Meds: . acetaminophen  1,000 mg Oral Q6H   Or  . acetaminophen (TYLENOL) oral liquid 160 mg/5 mL  1,000 mg Oral Q6H  . allopurinol  100 mg Oral BID  . bisacodyl  10 mg Oral Daily  . Chlorhexidine Gluconate Cloth  6 each Topical Daily  . dextromethorphan-guaiFENesin  1 tablet Oral BID  . feeding supplement (ENSURE ENLIVE)  237 mL Oral BID BM  .  multivitamin with minerals  1 tablet Oral Daily  . mupirocin ointment  1 application Nasal BID  . pantoprazole  40 mg Oral Daily  . polyethylene glycol  17 g Oral Daily   Continuous Infusions: . 0.9 % NaCl with KCl 20 mEq / L Stopped (03/11/18 1826)  . ceFEPime (MAXIPIME) IV    . heparin 1,150 Units/hr (03/13/18 1358)  . potassium chloride    . vancomycin    . vancomycin       LOS: 7 days    Time spent: 25 mins.More than 50% of that time was spent in counseling and/or coordination of care.      Shelly Coss, MD [facility] Pager [phone]  If 7PM-7AM, please contact night-coverage [email or web address] Password [email or web address] 03/14/2018, 8:06 AM

## 2018-03-15 DIAGNOSIS — Z5111 Encounter for antineoplastic chemotherapy: Secondary | ICD-10-CM

## 2018-03-15 DIAGNOSIS — I82622 Acute embolism and thrombosis of deep veins of left upper extremity: Secondary | ICD-10-CM

## 2018-03-15 DIAGNOSIS — J90 Pleural effusion, not elsewhere classified: Secondary | ICD-10-CM

## 2018-03-15 DIAGNOSIS — C8528 Mediastinal (thymic) large B-cell lymphoma, lymph nodes of multiple sites: Secondary | ICD-10-CM

## 2018-03-15 DIAGNOSIS — J910 Malignant pleural effusion: Secondary | ICD-10-CM

## 2018-03-15 DIAGNOSIS — C8522 Mediastinal (thymic) large B-cell lymphoma, intrathoracic lymph nodes: Principal | ICD-10-CM

## 2018-03-15 LAB — PHOSPHORUS: Phosphorus: 3.3 mg/dL (ref 2.5–4.6)

## 2018-03-15 LAB — COMPREHENSIVE METABOLIC PANEL
ALK PHOS: 101 U/L (ref 38–126)
ALT: 82 U/L — ABNORMAL HIGH (ref 0–44)
AST: 51 U/L — ABNORMAL HIGH (ref 15–41)
Albumin: 2.6 g/dL — ABNORMAL LOW (ref 3.5–5.0)
Anion gap: 8 (ref 5–15)
BUN: 10 mg/dL (ref 6–20)
CALCIUM: 8.5 mg/dL — AB (ref 8.9–10.3)
CO2: 25 mmol/L (ref 22–32)
Chloride: 105 mmol/L (ref 98–111)
Creatinine, Ser: 0.7 mg/dL (ref 0.61–1.24)
GFR calc Af Amer: >60 mL/min (ref >60)
GFR calc non Af Amer: >60 mL/min (ref >60)
Glucose, Bld: 117 mg/dL — ABNORMAL HIGH (ref 70–99)
Potassium: 4.5 mmol/L (ref 3.5–5.1)
Sodium: 138 mmol/L (ref 135–145)
Total Bilirubin: 0.5 mg/dL (ref 0.3–1.2)
Total Protein: 6.2 g/dL — ABNORMAL LOW (ref 6.5–8.1)

## 2018-03-15 LAB — CBC WITH DIFFERENTIAL/PLATELET
Abs Immature Granulocytes: 0.06 10*3/uL (ref 0.00–0.07)
Basophils Absolute: 0 10*3/uL (ref 0.0–0.1)
Basophils Relative: 0 %
Eosinophils Absolute: 0.1 10*3/uL (ref 0.0–0.5)
Eosinophils Relative: 1 %
HCT: 38.8 % — ABNORMAL LOW (ref 39.0–52.0)
Hemoglobin: 12.1 g/dL — ABNORMAL LOW (ref 13.0–17.0)
Immature Granulocytes: 1 %
LYMPHS PCT: 4 %
Lymphs Abs: 0.4 10*3/uL — ABNORMAL LOW (ref 0.7–4.0)
MCH: 25.4 pg — ABNORMAL LOW (ref 26.0–34.0)
MCHC: 31.2 g/dL (ref 30.0–36.0)
MCV: 81.5 fL (ref 80.0–100.0)
MONO ABS: 0.2 10*3/uL (ref 0.1–1.0)
Monocytes Relative: 2 %
Neutro Abs: 11.9 10*3/uL — ABNORMAL HIGH (ref 1.7–7.7)
Neutrophils Relative %: 92 %
Platelets: 499 10*3/uL — ABNORMAL HIGH (ref 150–400)
RBC: 4.76 MIL/uL (ref 4.22–5.81)
RDW: 13.9 % (ref 11.5–15.5)
WBC: 12.7 10*3/uL — AB (ref 4.0–10.5)
nRBC: 0 % (ref 0.0–0.2)

## 2018-03-15 LAB — URIC ACID: Uric Acid, Serum: 3.1 mg/dL — ABNORMAL LOW (ref 3.7–8.6)

## 2018-03-15 LAB — ANAEROBIC CULTURE

## 2018-03-15 LAB — URINE CULTURE: Culture: NO GROWTH

## 2018-03-15 LAB — MAGNESIUM: Magnesium: 2.2 mg/dL (ref 1.7–2.4)

## 2018-03-15 MED ORDER — SODIUM CHLORIDE 0.9% FLUSH
10.0000 mL | Freq: Two times a day (BID) | INTRAVENOUS | Status: DC
  Administered 2018-03-15 – 2018-03-17 (×4): 10 mL

## 2018-03-15 MED ORDER — HEPARIN SOD (PORK) LOCK FLUSH 100 UNIT/ML IV SOLN: 250.0000 [IU] | Freq: Once | INTRAVENOUS | Status: DC | PRN

## 2018-03-15 MED ORDER — SODIUM CHLORIDE 0.9% FLUSH: 3.0000 mL | INTRAVENOUS | Status: DC | PRN

## 2018-03-15 MED ORDER — SODIUM CHLORIDE 0.9% FLUSH: 10.0000 mL | INTRAVENOUS | Status: DC | PRN

## 2018-03-15 MED ORDER — VINCRISTINE SULFATE CHEMO INJECTION 1 MG/ML
Freq: Once | INTRAVENOUS | Status: AC
  Administered 2018-03-16: 13:00:00 via INTRAVENOUS
  Filled 2018-03-15: qty 8

## 2018-03-15 MED ORDER — SODIUM CHLORIDE 0.9 % IV SOLN
Freq: Once | INTRAVENOUS | Status: AC
  Administered 2018-03-15: 8 mg via INTRAVENOUS
  Filled 2018-03-15: qty 4

## 2018-03-15 MED ORDER — PREDNISONE 20 MG PO TABS
60.0000 mg | ORAL_TABLET | Freq: Every day | ORAL | Status: AC
  Administered 2018-03-15 – 2018-03-19 (×5): 60 mg via ORAL
  Filled 2018-03-15 (×5): qty 3

## 2018-03-15 MED ORDER — SODIUM CHLORIDE 0.9 % IV SOLN
INTRAVENOUS | Status: DC
  Administered 2018-03-15: 13:00:00 via INTRAVENOUS

## 2018-03-15 MED ORDER — SODIUM CHLORIDE 0.9 % IV SOLN
Freq: Once | INTRAVENOUS | Status: AC
  Administered 2018-03-16: 8 mg via INTRAVENOUS
  Filled 2018-03-15 (×2): qty 4

## 2018-03-15 MED ORDER — VINCRISTINE SULFATE CHEMO INJECTION 1 MG/ML
Freq: Once | INTRAVENOUS | Status: AC
  Administered 2018-03-15: 14:00:00 via INTRAVENOUS
  Filled 2018-03-15: qty 8

## 2018-03-15 MED ORDER — ALTEPLASE 2 MG IJ SOLR
2.0000 mg | Freq: Once | INTRAMUSCULAR | Status: DC | PRN
  Filled 2018-03-15: qty 2

## 2018-03-15 MED ORDER — COLD PACK MISC ONCOLOGY
1.0000 | Freq: Once | Status: DC | PRN
  Filled 2018-03-15: qty 1

## 2018-03-15 MED ORDER — ENOXAPARIN SODIUM 60 MG/0.6ML ~~LOC~~ SOLN
1.0000 mg/kg | Freq: Two times a day (BID) | SUBCUTANEOUS | Status: DC
  Administered 2018-03-15 – 2018-03-19 (×8): 55 mg via SUBCUTANEOUS
  Filled 2018-03-15 (×8): qty 0.6

## 2018-03-15 MED ORDER — HOT PACK MISC ONCOLOGY
1.0000 | Freq: Once | Status: DC | PRN
  Filled 2018-03-15: qty 1

## 2018-03-15 MED ORDER — HEPARIN SOD (PORK) LOCK FLUSH 100 UNIT/ML IV SOLN: 500.0000 [IU] | Freq: Once | INTRAVENOUS | Status: DC | PRN

## 2018-03-15 NOTE — Plan of Care (Signed)
  Problem: Clinical Measurements: Goal: Ability to maintain clinical measurements within normal limits will improve Outcome: Progressing   Problem: Clinical Measurements: Goal: Will remain free from infection Outcome: Progressing   Problem: Clinical Measurements: Goal: Diagnostic test results will improve Outcome: Progressing   Problem: Clinical Measurements: Goal: Respiratory complications will improve Outcome: Progressing   Problem: Clinical Measurements: Goal: Cardiovascular complication will be avoided Outcome: Progressing   Problem: Activity: Goal: Risk for activity intolerance will decrease Outcome: Progressing   Problem: Nutrition: Goal: Adequate nutrition will be maintained Outcome: Progressing   Problem: Coping: Goal: Level of anxiety will decrease Outcome: Progressing   Problem: Skin Integrity: Goal: Risk for impaired skin integrity will decrease Outcome: Progressing

## 2018-03-15 NOTE — Progress Notes (Signed)
Peripherally Inserted Central Catheter/Midline Placement  The IV Nurse has discussed with the patient and/or persons authorized to consent for the patient, the purpose of this procedure and the potential benefits and risks involved with this procedure.  The benefits include less needle sticks, lab draws from the catheter, and the patient may be discharged home with the catheter. Risks include, but not limited to, infection, bleeding, blood clot (thrombus formation), and puncture of an artery; nerve damage and irregular heartbeat and possibility to perform a PICC exchange if needed/ordered by physician.  Alternatives to this procedure were also discussed.  Bard Power PICC patient education guide, fact sheet on infection prevention and patient information card has been provided to patient /or left at bedside.  Mother and sister also at bedside.  All questions answered satisfactorily.  PICC/Midline Placement Documentation  PICC Double Lumen 03/15/18 PICC Right Brachial 40 cm 0 cm (Active)  Indication for Insertion or Continuance of Line Administration of hyperosmolar/irritating solutions (i.e. TPN, Vancomycin, etc.);Chronic illness with exacerbations (CF, Sickle Cell, etc.);Prolonged intravenous therapies 03/15/2018 10:33 AM  Exposed Catheter (cm) 0 cm 03/15/2018 10:33 AM  Site Assessment Clean;Dry;Intact 03/15/2018 10:33 AM  Lumen #1 Status Flushed;Saline locked;Blood return noted 03/15/2018 10:33 AM  Lumen #2 Status Flushed;Saline locked;Blood return noted 03/15/2018 10:33 AM  Dressing Type Transparent 03/15/2018 10:33 AM  Dressing Status Clean;Dry;Intact;Antimicrobial disc in place 03/15/2018 10:33 AM  Line Care Connections checked and tightened 03/15/2018 10:33 AM  Line Adjustment (NICU/IV Team Only) No 03/15/2018 10:33 AM  Dressing Intervention New dressing 03/15/2018 10:33 AM  Dressing Change Due 03/22/18 03/15/2018 10:33 AM       Rolena Infante 03/15/2018, 10:34 AM

## 2018-03-15 NOTE — Progress Notes (Signed)
Patient ID: Nicholas Moreno, male   DOB: [birth year], 22 y.o.   MRN: [id number]  PROGRESS NOTE    Robyn Nohr  BOF:[id number] DOB: [birth year] DOA: 03/07/2018 PCP: Patient, No Pcp Per   Brief Narrative:  22 year old Filipino male presented with shortness of breath and was found to have large mediastinal mass, left pleural effusion and left pulmonary embolism.  Started on heparin drip and underwent left-sided thoracentesis by PCCM.  Oncology was consulted.  Underwent VATS and mediastinal mass biopsy by CT surgery.  Status post chest tube placement and removal.  Biopsy confirmed high-grade B-cell lymphoma.  He was also found to have left IJ and subclavian vein DVT.  He was also started on antibiotics for fever.  He was transferred to [facility] long hospital to initiate chemotherapy as per oncology recommendations.   Assessment & Plan:   Principal Problem:   Lymphoma of intrathoracic lymph nodes (HCC) Active Problems:   Mediastinal mass   Single subsegmental pulmonary embolism without acute cor pulmonale   Superior vena cava syndrome   S/P thoracentesis   Malnutrition of moderate degree   Counseling regarding advance care planning and goals of care   High-grade B-cell lymphoma/mediastinal mass -He underwent VATS with biopsy of mediastinal mass and biopsies of the right lower lobe and left upper lobe masses: Biopsy showed high-grade B-cell lymphoma. -No lymphadenopathy in the abdomen or pelvis and CT of abdomen/pelvis -Status post chest tube placement and removal -Oncology following: Completed 3-day course of dexamethasone.  Patient has been transferred to [facility] long hospital to initiate chemotherapy as per oncology recommendations.  Fever -Patient was started on cefepime and vancomycin on 03/14/2018 for fever the day prior.  He has been afebrile since then.  No evidence of any infection although CT of the abdomen showed findings suggestive of pyelonephritis.  UA was negative and patient  does not have urinary symptoms.  Will discontinue antibiotics and monitor.  Cultures negative so far  Left upper extremity DVT with single subsegmental pulmonary embolism -Patient has left IJ and subclavian DVT.  Heparin drip has been discontinued and patient has been started on Eliquis by oncology  Superior vena cava compression from mass -Patient has completed course of dexamethasone  Leukocytosis -Resolved.  Monitor  Left pleural effusion -Status post thoracentesis -Status post VATS and chest tube placement and removal -Chest x-ray from 03/14/2018 had shown slight decrease in left-sided hydropneumothorax and CT surgery was aware of this   DVT prophylaxis: Eliquis Code Status: Full Family Communication: Spoke to patient and family at bedside Disposition Plan: Depends on clinical outcome and oncology recommendations  Consultants: Oncology/CT surgery/PCCM  Procedures: Thoracentesis, VATS procedure, chest tube placement and removal  Antimicrobials:  Anti-infectives (From admission, onward)   Start     Dose/Rate Route Frequency Ordered Stop   03/14/18 2000  vancomycin (VANCOCIN) IVPB 750 mg/150 ml premix     750 mg 150 mL/hr over 60 Minutes Intravenous Every 12 hours 03/14/18 0755     03/14/18 0800  ceFEPIme (MAXIPIME) 1 g in sodium chloride 0.9 % 100 mL IVPB     1 g 200 mL/hr over 30 Minutes Intravenous Every 8 hours 03/14/18 0755     03/14/18 0800  vancomycin (VANCOCIN) 1,250 mg in sodium chloride 0.9 % 250 mL IVPB     1,250 mg 166.7 mL/hr over 90 Minutes Intravenous  Once 03/14/18 0755 03/14/18 1156   03/10/18 2330  ceFAZolin (ANCEF) IVPB 2g/100 mL premix     2 g 200 mL/hr over 30 Minutes Intravenous  Every 8 hours 03/10/18 2001 03/11/18 0739   03/09/18 1319  ceFAZolin (ANCEF) IVPB 2g/100 mL premix     2 g 200 mL/hr over 30 Minutes Intravenous 30 min pre-op 03/09/18 1319 03/10/18 1525        Subjective: Patient seen and examined at bedside.  Denies any new complaints.   No overnight fever or vomiting.  No worsening shortness of breath.  Objective: Vitals:   03/14/18 1149 03/14/18 1848 03/14/18 2000 03/15/18 0429  BP: (!) 109/54 115/68 (!) 117/58 112/61  Pulse: 96 (!) 105 (!) 105 93  Resp: 20  16 14   Temp: 97.7 F (36.5 C) 98.1 F (36.7 C) 99.3 F (37.4 C) 98.8 F (37.1 C)  TempSrc: Oral Oral Oral Oral  SpO2: 99% 100% 97% 98%  Weight:      Height:        Intake/Output Summary (Last 24 hours) at 03/15/2018 1107 Last data filed at 03/15/2018 0300 Gross per 24 hour  Intake 376.59 ml  Output -  Net 376.59 ml   Filed Weights   03/07/18 2000 03/08/18 0456 03/10/18 1344  Weight: 52.9 kg 52.9 kg 52.9 kg    Examination:  General exam: Appears calm and comfortable  Respiratory system: Bilateral decreased breath sounds at bases with some scattered crackles Cardiovascular system: S1 & S2 heard, Rate controlled Gastrointestinal system: Abdomen is nondistended, soft and nontender. Normal bowel sounds heard. Extremities: No cyanosis, edema      Data Reviewed: I have personally reviewed following labs and imaging studies  CBC: Recent Labs  Lab 03/09/18 0215 03/10/18 0143 03/10/18 0447 03/11/18 0543 03/12/18 0530 03/13/18 0923 03/14/18 0500  WBC 6.8 17.5* 16.3* 11.5* 11.8* 11.4* 10.0  NEUTROABS 6.1 15.8* 14.7*  --   --   --  8.6*  HGB 12.2* 12.0* 12.8* 11.3* 11.4* 12.1* 11.5*  HCT 39.8 38.3* 40.2 37.2* 37.4* 39.0 36.6*  MCV 80.2 79.3* 79.4* 81.6 81.1 81.1 81.2  PLT 534* 597* 550* 471* 451* 499* 353*   Basic Metabolic Panel: Recent Labs  Lab 03/09/18 0215 03/10/18 0447 03/11/18 0543 03/12/18 0530 03/14/18 0500  NA 133* 139 137 138 139  K 3.9 4.0 4.3 4.2 3.9  CL 98 102 103 103 102  CO2 22 31 24 28 27   GLUCOSE 213* 107* 117* 108* 95  BUN 8 9 16 17 9   CREATININE 0.77 0.67 0.81 0.91 0.82  CALCIUM 8.7* 8.8* 8.6* 8.6* 8.4*   GFR: Estimated Creatinine Clearance: 106.6 mL/min (by C-G formula based on SCr of 0.82 mg/dL). Liver  Function Tests: Recent Labs  Lab 03/08/18 1545 03/09/18 0215 03/10/18 0447 03/12/18 0530  AST  --  28 20 26   ALT  --  24 26 21   ALKPHOS  --  54 52 46  BILITOT  --  0.4 0.2* 0.3  PROT 6.7 6.0* 5.9* 5.1*  ALBUMIN  --  2.4* 2.6* 2.3*   No results for input(s): LIPASE, AMYLASE in the last 168 hours. No results for input(s): AMMONIA in the last 168 hours. Coagulation Profile: Recent Labs  Lab 03/10/18 0143  INR 1.09   Cardiac Enzymes: No results for input(s): CKTOTAL, CKMB, CKMBINDEX, TROPONINI in the last 168 hours. BNP (last 3 results) No results for input(s): PROBNP in the last 8760 hours. HbA1C: No results for input(s): HGBA1C in the last 72 hours. CBG: Recent Labs  Lab 03/10/18 2135 03/10/18 2322 03/11/18 0315 03/11/18 0512 03/11/18 1129  GLUCAP 118* 111* 114* 117* 122*   Lipid Profile: No results  for input(s): CHOL, HDL, LDLCALC, TRIG, CHOLHDL, LDLDIRECT in the last 72 hours. Thyroid Function Tests: No results for input(s): TSH, T4TOTAL, FREET4, T3FREE, THYROIDAB in the last 72 hours. Anemia Panel: No results for input(s): VITAMINB12, FOLATE, FERRITIN, TIBC, IRON, RETICCTPCT in the last 72 hours. Sepsis Labs: No results for input(s): PROCALCITON, LATICACIDVEN in the last 168 hours.  Recent Results (from the past 240 hour(s))  MRSA PCR Screening     Status: None   Collection Time: 03/07/18  7:58 PM  Result Value Ref Range Status   MRSA by PCR NEGATIVE NEGATIVE Final    Comment:        The GeneXpert MRSA Assay (FDA approved for NASAL specimens only), is one component of a comprehensive MRSA colonization surveillance program. It is not intended to diagnose MRSA infection nor to guide or monitor treatment for MRSA infections. Performed at [facility], [place] [address] [place], [place] [address]   Body fluid culture     Status: None   Collection Time: 03/08/18  3:24 PM  Result Value Ref Range Status   Specimen Description PLEURAL LEFT  Final    Special Requests NONE  Final   Gram Stain   Final    ABUNDANT WBC PRESENT, PREDOMINANTLY MONONUCLEAR NO ORGANISMS SEEN    Culture   Final    NO GROWTH 3 DAYS Performed at [facility], [place] [address] [place], [place] [address]    Report Status 03/11/2018 FINAL  Final  Surgical pcr screen     Status: Abnormal   Collection Time: 03/09/18  1:43 PM  Result Value Ref Range Status   MRSA, PCR NEGATIVE NEGATIVE Final   Staphylococcus aureus POSITIVE (A) NEGATIVE Final    Comment: (NOTE) The Xpert SA Assay (FDA approved for NASAL specimens in patients 67 years of age and older), is one component of a comprehensive surveillance program. It is not intended to diagnose infection nor to guide or monitor treatment.   Anaerobic culture     Status: None (Preliminary result)   Collection Time: 03/10/18  4:09 PM  Result Value Ref Range Status   Specimen Description BRONCHIAL WASHINGS LEFT  Final   Special Requests   Final    NONE Performed at [facility], [address] [place], [place] [address]    Culture   Final    NO ANAEROBES ISOLATED; CULTURE IN PROGRESS FOR 5 DAYS   Report Status PENDING  Incomplete  Culture, fungus without smear     Status: None (Preliminary result)   Collection Time: 03/10/18  4:09 PM  Result Value Ref Range Status   Specimen Description BRONCHIAL WASHINGS LEFT  Final   Special Requests NONE  Final   Culture   Final    NO FUNGUS ISOLATED AFTER 3 DAYS Performed at [facility], [place] [address], [place], [place] [address]    Report Status PENDING  Incomplete  Acid Fast Smear (AFB)     Status: None   Collection Time: 03/10/18  4:09 PM  Result Value Ref Range Status   AFB Specimen Processing Concentration  Final   Acid Fast Smear Negative  Final    Comment: (NOTE) Performed At: [facility] [place], [place] [id number] Rush Farmer MD [id number]    Source (AFB) BRONCHIAL WASHINGS  Final    Comment:  LEFT Performed at [facility], [place] [address] [place], [place] [address]   Culture, respiratory     Status:  None   Collection Time: 03/10/18  4:09 PM  Result Value Ref Range Status   Specimen Description BRONCHIAL WASHINGS LEFT  Final   Special Requests NONE  Final   Gram Stain   Final    FEW WBC PRESENT, PREDOMINANTLY PMN NO ORGANISMS SEEN    Culture   Final    RARE Consistent with normal respiratory flora. Performed at [facility], [place] [address] [place], [place] [address]    Report Status 03/12/2018 FINAL  Final         Radiology Studies: Dg Chest 2 View  Result Date: 03/14/2018 CLINICAL DATA:  Shortness of breath EXAM: CHEST - 2 VIEW COMPARISON:  03/13/2018 FINDINGS: Right jugular central line is again seen. Left-sided apical pneumothorax is again noted and slightly improved when compared with the prior exam. Persistent mediastinal masses are noted stable from the prior exam. Persistent small left pleural effusion is noted as well as left basilar consolidation. IMPRESSION: Slight decrease in left-sided hydropneumothorax. Persistent mediastinal masses with evidence of left basilar consolidation. Electronically Signed   By: Inez Catalina M.D.   On: 03/14/2018 07:16   Ct Abdomen Pelvis W Contrast  Result Date: 03/13/2018 CLINICAL DATA:  Large anterior mediastinal mass suspicious for lymphoma. Status post mediastinoscopy and VATS. EXAM: CT ABDOMEN AND PELVIS WITH CONTRAST TECHNIQUE: Multidetector CT imaging of the abdomen and pelvis was performed using the standard protocol following bolus administration of intravenous contrast. CONTRAST:  135mL OMNIPAQUE IOHEXOL 300 MG/ML  SOLN COMPARISON:  Recent chest CT 03/07/2018 FINDINGS: Lower chest: Persistent left pleural effusion containing air from recent surgery. There is also air in the subcutaneous tissues of the anterior chest wall. Left lower lobe atelectasis. The heart is normal in size. The distal esophagus is grossly  normal. Small right pleural effusion. The right lung base is grossly clear. Hepatobiliary: No focal hepatic lesions or intrahepatic biliary dilatation. The gallbladder is normal. No common bile duct dilatation. Pancreas: No mass, inflammation or ductal dilatation. Spleen: Normal size.  No focal lesions. Adrenals/Urinary Tract: Adrenal glands are unremarkable. Both kidneys demonstrate patchy peripheral perfusion defects typically seen with pyelonephritis. Recommend correlation with urinalysis. Stomach/Bowel: The stomach, duodenum, small bowel and colon are grossly normal. No acute inflammatory changes, mass lesions or obstructive findings. Vascular/Lymphatic: No mesenteric or retroperitoneal mass or adenopathy. The aorta and branch vessels are normal. The major venous structures are patent. Reproductive: The prostate gland and seminal vesicles are unremarkable. 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 Musculoskeletal: No significant bony findings. No worrisome bone lesions. IMPRESSION: 1. Bilateral pleural effusions, left greater than right with left lower lobe atelectasis. There is also some pleural air and subcutaneous air on the left side consistent with recent surgery. 2. No lymphadenopathy in the abdomen or pelvis. 3. Patchy perfusion abnormalities involving both kidneys most typically seen with pyelonephritis. Recommend correlation with urinalysis. 4. Body wall edema, mesenteric edema and free pelvic fluid as above. Electronically Signed   By: Marijo Sanes M.D.   On: 03/13/2018 19:32   [place] Ekg Site Rite  Result Date: 03/14/2018 If Site Rite image not attached, placement could not be confirmed due to current cardiac rhythm.       Scheduled Meds: . acetaminophen  1,000 mg Oral Q6H   Or  . acetaminophen (TYLENOL) oral liquid 160 mg/5 mL  1,000 mg Oral Q6H   .  allopurinol  100 mg Oral BID  . apixaban  10 mg Oral BID   Followed by  . [START ON 03/21/2018] apixaban  5 mg Oral BID  . bisacodyl  10 mg Oral Daily  . Chlorhexidine Gluconate Cloth  6 each Topical Daily  . dextromethorphan-guaiFENesin  1 tablet Oral BID  . 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 [START ON 03/16/2018] DOXOrubicin/vinCRIStine/etoposide CHEMO IV infusion for Inpatient CI   Intravenous Once  . feeding supplement (ENSURE ENLIVE)  237 mL Oral BID BM  . multivitamin with minerals  1 tablet Oral Daily  . mupirocin ointment  1 application Nasal BID  . pantoprazole  40 mg Oral Daily  . polyethylene glycol  17 g Oral Daily  . predniSONE  60 mg Oral QAC breakfast  . sodium chloride flush  10-40 mL Intracatheter Q12H   Continuous Infusions: . sodium chloride    . 0.9 % NaCl with KCl 20 mEq / L 10 mL/hr at 03/15/18 0300  . ceFEPime (MAXIPIME) IV 1 g (03/15/18 0559)  . ondansetron (ZOFRAN) with dexamethasone (DECADRON) IV    . [START ON 03/16/2018] ondansetron (ZOFRAN) with dexamethasone (DECADRON) IV    . potassium chloride    . vancomycin 750 mg (03/15/18 1045)     LOS: 8 days        [name], MD [facility] Pager [phone]  If 7PM-7AM, please contact night-coverage [email or web address] Password [email or web address] 03/15/2018, 11:07 AM

## 2018-03-15 NOTE — Progress Notes (Addendum)
HEMATOLOGY/ONCOLOGY INPATIENT PROGRESS NOTE  Date of Service: 03/15/2018  Inpatient Attending: .[name], MD   SUBJECTIVE:   Nicholas Moreno was seen on the oncology floor at [facility]. 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 Results were discussed in details with [name]. Additional FISH testing and 2nd opinion read sent to [facility]. Patient notes that his breathing is slightly improved and less SOB and improved orthopnea.. No further night sweats. Having a cough with productive phlegm. Had a fever yesterday and is on abx to cover for post obstructive/HCAP pneumonia I discussed all available results in details with the patient and his mother at bedside. Chemotherapy regimen EPOCH-R , G-CSF use was discussed in details including potential side effects, treatment goals and treatment strategy and followup plans. All questions answered and informed consent obtained.  Rt UE PICC line placed.  On review of systems, pt reports slightly improved breathing, pain while coughing, and denies testicular pain or swelling, leg swelling, and any other symptoms.   OBJECTIVE:  Mild discomfort from previous chest tube  PHYSICAL EXAMINATION: . Vitals:   03/14/18 1848 03/14/18 2000 03/15/18 0429 03/15/18 1358  BP: 115/68 (!) 117/58 112/61 97/64  Pulse: (!) 105 (!) 105 93 99  Resp:  16 14 16   Temp: 98.1 F (36.7 C) 99.3 F (37.4 C) 98.8 F (37.1 C) 98.7 F (37.1 C)  TempSrc: Oral Oral Oral Oral  SpO2: 100% 97% 98% 98%  Weight:      Height:       Filed Weights   03/07/18 2000 03/08/18 0456 03/10/18 1344  Weight: 116 lb 10 oz (52.9 kg) 116 lb 10 oz (52.9 kg) 116 lb 10 oz (52.9 kg)   .Body mass index is 18.83 kg/m.  GENERAL:alert, in mild respiratory distress with increased SOB if laying flat, improved on sitting up SKIN: no acute rashes, no  significant lesions EYES: conjunctiva are pink and non-injected, sclera anicteric OROPHARYNX: MMM, no exudates, no oropharyngeal erythema or ulceration NECK: supple, no JVD LYMPH:  no palpable lymphadenopathy in the cervical, axillary or inguinal regions LUNGS: Decreased air entry throughout left lung zones (improved from previous), good air entry on the right  HEART: regular rate & rhythm ABDOMEN:  normoactive bowel sounds , non tender, not distended. No palpable hepatosplenomegaly.  TESTES: No testicular swelling or tenderness to palpation Extremity: no pedal edema PSYCH: alert & oriented x 3 with fluent speech NEURO: no focal motor/sensory deficits   MEDICAL HISTORY:  History reviewed. No pertinent past medical history.  SURGICAL HISTORY: Past Surgical History:  Procedure Laterality Date  . MEDIASTINOSCOPY N/A 03/10/2018   Procedure: MEDIASTINOSCOPY;  Surgeon: Ivin Poot, MD;  Location: [place];  Service: Thoracic;  Laterality: N/A;  . VIDEO ASSISTED THORACOSCOPY (VATS)/ LYMPH NODE SAMPLING Left 03/10/2018   Procedure: VIDEO ASSISTED THORACOSCOPY (VATS)/ BIOSPY ANTERIOR APPROACH;  Surgeon: Ivin Poot, MD;  Location: [place];  Service: Thoracic;  Laterality: Left;  Marland Kitchen VIDEO BRONCHOSCOPY Left 03/10/2018   Procedure: VIDEO BRONCHOSCOPY;  Surgeon: Ivin Poot, MD;  Location: [facility];  Service: Thoracic;  Laterality: Left;    SOCIAL HISTORY: Social History   Socioeconomic History  . Marital status: Single    Spouse name: Not on file  . Number of children: Not on file  . Years of education: Not on file  . Highest education level: Not on file  Occupational History  .  Not on file  Social Needs  . Financial resource strain: Not on file  . Food insecurity:    Worry: Not on file    Inability: Not on file  . Transportation needs:    Medical: Not on file    Non-medical: Not on file  Tobacco Use  . Smoking status: Never Smoker  . Smokeless tobacco: Never Used  Substance  and Sexual Activity  . Alcohol use: Not Currently  . Drug use: Not Currently  . Sexual activity: Not on file  Lifestyle  . Physical activity:    Days per week: Not on file    Minutes per session: Not on file  . Stress: Not on file  Relationships  . Social connections:    Talks on phone: Not on file    Gets together: Not on file    Attends religious service: Not on file    Active member of club or organization: Not on file    Attends meetings of clubs or organizations: Not on file    Relationship status: Not on file  . Intimate partner violence:    Fear of current or ex partner: Not on file    Emotionally abused: Not on file    Physically abused: Not on file    Forced sexual activity: Not on file  Other Topics Concern  . Not on file  Social History Narrative  . Not on file    FAMILY HISTORY: History reviewed. No pertinent family history.  ALLERGIES:  has No Known Allergies.  MEDICATIONS:  Scheduled Meds: . acetaminophen  1,000 mg Oral Q6H   Or  . acetaminophen (TYLENOL) oral liquid 160 mg/5 mL  1,000 mg Oral Q6H  . allopurinol  100 mg Oral BID  . apixaban  10 mg Oral BID   Followed by  . [START ON 03/21/2018] apixaban  5 mg Oral BID  . bisacodyl  10 mg Oral Daily  . Chlorhexidine Gluconate Cloth  6 each Topical Daily  . dextromethorphan-guaiFENesin  1 tablet Oral BID  . 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 [START ON 03/16/2018] DOXOrubicin/vinCRIStine/etoposide CHEMO IV infusion for Inpatient CI   Intravenous Once  . feeding supplement (ENSURE ENLIVE)  237 mL Oral BID BM  . multivitamin with minerals  1 tablet Oral Daily  . mupirocin ointment  1 application Nasal BID  . pantoprazole  40 mg Oral Daily  . polyethylene glycol  17 g Oral Daily  . predniSONE  60 mg Oral QAC breakfast  . sodium chloride flush  10-40 mL Intracatheter Q12H   Continuous Infusions: . sodium chloride    . 0.9 % NaCl with KCl 20 mEq / L  10 mL/hr at 03/15/18 0300  . ceFEPime (MAXIPIME) IV 1 g (03/15/18 0559)  . ondansetron (ZOFRAN) with dexamethasone (DECADRON) IV    . [START ON 03/16/2018] ondansetron (ZOFRAN) with dexamethasone (DECADRON) IV    . potassium chloride    . vancomycin Stopped (03/14/18 2146)   PRN Meds:.alteplase, Cold Pack, fentaNYL (SUBLIMAZE) injection, heparin lock flush, heparin lock flush, Hot Pack, Influenza vac split quadrivalent PF, ondansetron (ZOFRAN) IV, potassium chloride, sodium chloride flush, sodium chloride flush, sodium chloride flush, traMADol  REVIEW OF SYSTEMS:    10 Point review of Systems was done is negative except as noted above.   LABORATORY DATA:  I have reviewed the data as listed  . CBC Latest Ref Rng & Units 03/14/2018 03/13/2018 03/12/2018  WBC 4.0 - 10.5 K/uL  10.0 11.4(H) 11.8(H)  Hemoglobin 13.0 - 17.0 g/dL 11.5(L) 12.1(L) 11.4(L)  Hematocrit 39.0 - 52.0 % 36.6(L) 39.0 37.4(L)  Platelets 150 - 400 K/uL 435(H) 499(H) 451(H)    . CMP Latest Ref Rng & Units 03/14/2018 03/12/2018 03/11/2018  Glucose 70 - 99 mg/dL 95 108(H) 117(H)  BUN 6 - 20 mg/dL 9 17 16   Creatinine 0.61 - 1.24 mg/dL 0.82 0.91 0.81  Sodium 135 - 145 mmol/L 139 138 137  Potassium 3.5 - 5.1 mmol/L 3.9 4.2 4.3  Chloride 98 - 111 mmol/L 102 103 103  CO2 22 - 32 mmol/L 27 28 24   Calcium 8.9 - 10.3 mg/dL 8.4(L) 8.6(L) 8.6(L)  Total Protein 6.5 - 8.1 g/dL - 5.1(L) -  Total Bilirubin 0.3 - 1.2 mg/dL - 0.3 -  Alkaline Phos 38 - 126 U/L - 46 -  AST 15 - 41 U/L - 26 -  ALT 0 - 44 U/L - 21 -   Component     Latest Ref Rng & Units 03/07/2018 03/08/2018  HIV Screen 4th Generation wRfx     Non Reactive Non Reactive   HCV Ab     0.0 - 0.9 s/co ratio  <0.1  Hepatitis B Surface Ag     Negative  Negative  Hep B Core Ab, Tot     Negative  Negative    RADIOGRAPHIC STUDIES: I have personally reviewed the radiological images as listed and agreed with the findings in the report. Dg Chest 2 View  Result Date:  03/14/2018 CLINICAL DATA:  Shortness of breath EXAM: CHEST - 2 VIEW COMPARISON:  03/13/2018 FINDINGS: Right jugular central line is again seen. Left-sided apical pneumothorax is again noted and slightly improved when compared with the prior exam. Persistent mediastinal masses are noted stable from the prior exam. Persistent small left pleural effusion is noted as well as left basilar consolidation. IMPRESSION: Slight decrease in left-sided hydropneumothorax. Persistent mediastinal masses with evidence of left basilar consolidation. Electronically Signed   By: Inez Catalina M.D.   On: 03/14/2018 07:16   Dg Chest 2 View  Result Date: 03/13/2018 CLINICAL DATA:  Follow-up LEFT hydropneumothorax after chest tube removal. EXAM: CHEST - 2 VIEW COMPARISON:  03/12/2018 dating back to 03/08/2018. CT chest 03/07/2018. FINDINGS: RIGHT jugular central venous catheter tip projects over the LOWER SVC, unchanged stable LEFT apicolateral pneumothorax since yesterday, on the order of 15% or so. Loculated LEFT pleural effusion with air-fluid levels, unchanged. Massive mediastinal mass with compression of the LEFT lung and compression of the MEDIAL RIGHT UPPER LOBE as noted on the prior CT. Slight improved aeration in the LEFT UPPER LOBE since yesterday. No new abnormalities. IMPRESSION: 1. Stable LEFT apicolateral pneumothorax and loculated LEFT hydropneumothorax after chest tube removal. 2. Slight improved aeration in the LEFT UPPER LOBE since yesterday. 3. Otherwise, no change in the massive mediastinal mass with compressive atelectasis involving much of the LEFT lung and the MEDIAL RIGHT UPPER LOBE. 4. No new abnormalities. Electronically Signed   By: Evangeline Dakin M.D.   On: 03/13/2018 09:17   Ct Angio Chest Pe W And/or Wo Contrast  Result Date: 03/07/2018 CLINICAL DATA:  Shortness of breath and cough. EXAM: CT ANGIOGRAPHY CHEST WITH CONTRAST TECHNIQUE: Multidetector CT imaging of the chest was performed using the  standard protocol during bolus administration of intravenous contrast. Multiplanar CT image reconstructions and MIPs were obtained to evaluate the vascular anatomy. CONTRAST:  145mL ISOVUE-370 IOPAMIDOL (ISOVUE-370) INJECTION 76% COMPARISON:  None. FINDINGS: Cardiovascular: Normal heart size.  The main pulmonary artery appears patent. Filling defect within the left upper lobe lobar pulmonary artery, image 104 through image 101 compatible with acute pulmonary emboli. Mediastinum/Nodes: A very large anterior mediastinal mass is identified which extends into both upper lung zones. This mass encases and narrows the superior vena cava as well as the branch vessels off the aortic arch. There is extensive chest wall collaterals especially in the left supraclavicular region and paraspinal region. Encasement and narrowing of the left main pulmonary artery and bilateral upper lobe pulmonary arteries. Encasement and narrowing of the trachea is identified with rightward and posterior displacement. The left mainstem bronchus is narrowed, and bilateral upper lobe bronchi are occluded. Narrowing of the left lower lobe bronchi. Lungs/Pleura: There is a moderate left pleural effusion. Approximately 90% opacification of the left upper lobe is identified secondary to tumor and postobstructive consolidation. A small amount of aerated lung within the apex. Compressive type atelectasis is noted within the left lung base. There is approximately 50% opacification of the right upper lobe with patchy areas of airspace consolidation and ground-glass attenuation within the remaining portions of the right upper lobe. The mass within the right upper lobe contains central areas of cavitation compatible with necrosis. Upper Abdomen: No acute findings. Musculoskeletal: No chest wall abnormality. No acute or significant osseous findings. Review of the MIP images confirms the above findings. IMPRESSION: 1. Small acute pulmonary emboli identified  within the distal left upper lobe lobar pulmonary artery. 2. 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 Primary differential considerations include lymphoma and leukemia as well as malignant derm cell tumors. 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 Narrowing and displacement of the trachea with complete occlusion of the upper lobe bronchi. There is also marked narrowing of bilateral upper lobe pulmonary arteries. 3. Significantly diminished aeration to both upper lobes, left greater than right. 4. Moderate left pleural effusion. Critical Value/emergent results were called by telephone at the time of interpretation on 03/07/2018 at 5:29 pm to Dr. Carlisle Cater , who verbally acknowledged these results. Electronically Signed   By: Kerby Moors M.D.   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 Status post mediastinoscopy and VATS. EXAM: CT ABDOMEN AND PELVIS WITH CONTRAST TECHNIQUE: Multidetector CT imaging of the abdomen and pelvis was performed using the standard protocol following bolus administration of intravenous contrast. CONTRAST:  12mL OMNIPAQUE IOHEXOL 300 MG/ML  SOLN COMPARISON:  Recent chest CT 03/07/2018 FINDINGS: Lower chest: Persistent left pleural effusion containing air from recent surgery. There is also air in the subcutaneous tissues of the anterior chest wall. Left lower lobe atelectasis. The heart is normal in size. The distal esophagus is grossly normal. Small right pleural effusion. The right lung base is grossly clear. Hepatobiliary: No focal hepatic lesions or intrahepatic biliary dilatation. The gallbladder is normal. No common bile duct dilatation. Pancreas: No mass, inflammation  or ductal dilatation. Spleen: Normal size.  No focal lesions. Adrenals/Urinary Tract: Adrenal glands are unremarkable. Both kidneys demonstrate patchy peripheral perfusion defects typically seen with pyelonephritis. Recommend correlation with urinalysis. Stomach/Bowel: The stomach, duodenum, small bowel and colon are grossly normal. No acute inflammatory changes, mass lesions or obstructive findings. Vascular/Lymphatic: No mesenteric or retroperitoneal mass or adenopathy. The aorta and branch vessels are  normal. The major venous structures are patent. Reproductive: The prostate gland and seminal vesicles are unremarkable. 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 Musculoskeletal: No significant bony findings. No worrisome bone lesions. IMPRESSION: 1. Bilateral pleural effusions, left greater than right with left lower lobe atelectasis. There is also some pleural air and subcutaneous air on the left side consistent with recent surgery. 2. No lymphadenopathy in the abdomen or pelvis. 3. Patchy perfusion abnormalities involving both kidneys most typically seen with pyelonephritis. Recommend correlation with urinalysis. 4. Body wall edema, mesenteric edema and free pelvic fluid as above. Electronically Signed   By: Marijo Sanes M.D.   On: 03/13/2018 19:32   Dg Chest Port 1 View  Result Date: 03/12/2018 CLINICAL DATA:  22 year old male with mediastinal mass, status post VATS and biopsy with mediastinoscopy 05/12/2017, suspected lymphoma. EXAM: PORTABLE CHEST 1 VIEW COMPARISON:  Chest x-ray 03/11/2018, 03/10/2018, 03/08/2018, CT 03/07/2018 FINDINGS: Cardiomediastinal silhouette partially obscured by lung/pleural disease/mediastinal disease, unchanged. In the interval there has been development of left apical pneumothorax component. Opacity at the left base obscuring the left  costophrenic angle in the left heart border. Unchanged left thoracostomy tube. Right IJ central venous catheter, unchanged. No right-sided confluent airspace disease. No pleural effusion or pneumothorax. IMPRESSION: Interval development of left apical pneumothorax, either ex vacuo, or representing air leak. Unchanged position of thoracostomy tube. Unchanged right IJ central venous catheter. Similar appearance of cardiomediastinal silhouette, with obscuration of the heart borders and predominantly left lung by known mediastinal mass and left lung consolidation, status post VATS and mediastinoscopy. These results were called by telephone at the time of interpretation on 03/12/2018 at 10:06 am to nurse caring for patient, Ms Lilia Pro in [facility]. Electronically Signed   By: Corrie Mckusick D.O.   On: 03/12/2018 10:07   Dg Chest Port 1 View  Result Date: 03/11/2018 CLINICAL DATA:  Postop chest biopsy.  Chest tube. EXAM: PORTABLE CHEST 1 VIEW COMPARISON:  03/10/2018 FINDINGS: Near complete opacification of the left chest again noted due to large mediastinal mass and collapse of the left lung. Left pleural effusion is present. Left chest tube remains in place and unchanged. No pneumothorax. Mediastinal mass is present crossing the midline to the right unchanged. Remainder of the right lung is normally aerated Right jugular central venous catheter tip in the mid SVC unchanged. No pneumothorax on the right. IMPRESSION: Large mediastinal mass unchanged. Compression of the left lung with left effusion unchanged. Left chest tube in place No pneumothorax. Electronically Signed   By: Franchot Gallo M.D.   On: 03/11/2018 08:53   Dg Chest Port 1 View  Result Date: 03/10/2018 CLINICAL DATA:  Post left VATS.  Evaluate pneumothorax.  Chest tube. EXAM: PORTABLE CHEST 1 VIEW COMPARISON:  03/08/2018 FINDINGS: Interval placement of left chest tube. Continued large left pleural effusion and diffuse airspace disease throughout the left  lung. No pneumothorax. Large rounded masslike opacity in the medial right upper lobe is stable. Right central line is in place with the tip in the SVC. No pneumothorax. IMPRESSION: Interval placement of left chest tube and right central line. No pneumothorax. Continued large left pleural effusion and near complete opacification of the left lung. Continued medial right upper lobe mass. Electronically Signed   By: Rolm Baptise M.D.   On: 03/10/2018 20:53   Dg Chest [facility] [id number]  Result Date: 03/08/2018 CLINICAL DATA:  Status post left thoracentesis EXAM: PORTABLE CHEST 1 VIEW COMPARISON:  Chest CT from 1 day prior FINDINGS: Near complete opacification of the left hemithorax with asymmetric volume loss in the left hemithorax, not substantially changed. Marked thickening of the upper mediastinum bilaterally, unchanged. Cardiac silhouette is obscured, with the heart appearing normal in size. No pneumothorax. No right pleural effusion. Persistent moderate left pleural effusion. Stable masslike opacity in the medial upper right lung with right upper lobe volume loss. IMPRESSION: 1. No pneumothorax.  Persistent moderate left pleural effusion. 2. Stable marked thickening of the upper mediastinum bilaterally compatible with known infiltrative anterior mediastinal mass. 3. Persistent volume loss in the left hemithorax and right upper lung. Stable near complete left lung opacification and masslike medial right upper lung opacity compatible with a combination of atelectasis and tumor. Electronically Signed   By: Ilona Sorrel M.D.   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 Comparison Study: No prior study on file for comparison Performing Technologist: Sharion Dove RVS  Examination Guidelines: A complete evaluation  includes B-mode imaging, spectral Doppler, color Doppler, and power Doppler as needed of all accessible portions of each vessel. Bilateral testing is considered an integral part of a complete examination. Limited examinations for reoccurring indications may be performed as noted.  Right Venous Findings: +---------+---------------+---------+-----------+----------+-------+          CompressibilityPhasicitySpontaneityPropertiesSummary +---------+---------------+---------+-----------+----------+-------+ CFV      Full           Yes      Yes                          +---------+---------------+---------+-----------+----------+-------+ SFJ      Full                                                 +---------+---------------+---------+-----------+----------+-------+ FV Prox  Full                                                 +---------+---------------+---------+-----------+----------+-------+ FV Mid   Full                                                 +---------+---------------+---------+-----------+----------+-------+ FV DistalFull                                                 +---------+---------------+---------+-----------+----------+-------+ PFV      Full                                                 +---------+---------------+---------+-----------+----------+-------+ POP      Full  Yes      Yes                          +---------+---------------+---------+-----------+----------+-------+ PTV      Full                                                 +---------+---------------+---------+-----------+----------+-------+ PERO     Full                                                 +---------+---------------+---------+-----------+----------+-------+  Left Venous Findings: +---------+---------------+---------+-----------+----------+-------+          CompressibilityPhasicitySpontaneityPropertiesSummary  +---------+---------------+---------+-----------+----------+-------+ CFV      Full           Yes      Yes                          +---------+---------------+---------+-----------+----------+-------+ SFJ      Full                                                 +---------+---------------+---------+-----------+----------+-------+ FV Prox  Full                                                 +---------+---------------+---------+-----------+----------+-------+ FV Mid   Full                                                 +---------+---------------+---------+-----------+----------+-------+ FV DistalFull                                                 +---------+---------------+---------+-----------+----------+-------+ PFV      Full                                                 +---------+---------------+---------+-----------+----------+-------+ POP      Full           Yes      Yes                          +---------+---------------+---------+-----------+----------+-------+ PTV      Full                                                 +---------+---------------+---------+-----------+----------+-------+ PERO     Full                                                 +---------+---------------+---------+-----------+----------+-------+  Summary: Right: There is no evidence of deep vein thrombosis in the lower extremity. Sluggish flow noted throughout Left: There is no evidence of deep vein thrombosis in the lower extremity. Sluggish flow noted throughout  *See table(s) above for measurements and observations. Electronically signed by Servando Snare MD on 03/09/2018 at 8:52:35 PM.    Final    Vas [place] Upper Extremity Venous Duplex  Result Date: 03/12/2018 UPPER VENOUS STUDY  Indications: Edema, and SVC syndrome, chest mass Performing Technologist: Landry Mellow RDMS, RVT  Examination Guidelines: A complete evaluation includes B-mode imaging, spectral Doppler,  color Doppler, and power Doppler as needed of all accessible portions of each vessel. Bilateral testing is considered an integral part of a complete examination. Limited examinations for reoccurring indications may be performed as noted.  Right Findings: +----------+------------+----------+---------+-----------+---------------------+ RIGHT     CompressiblePropertiesPhasicitySpontaneous       Summary        +----------+------------+----------+---------+-----------+---------------------+ IJV                                                  unable to image due                                                           to IJ line       +----------+------------+----------+---------+-----------+---------------------+ Subclavian    Full                 Yes       Yes                          +----------+------------+----------+---------+-----------+---------------------+ Axillary      Full                 Yes       Yes                          +----------+------------+----------+---------+-----------+---------------------+ Brachial      Full                 Yes       Yes                          +----------+------------+----------+---------+-----------+---------------------+ Radial        Full                 Yes       Yes                          +----------+------------+----------+---------+-----------+---------------------+ Ulnar         Full                                                        +----------+------------+----------+---------+-----------+---------------------+ Cephalic      Full                                                        +----------+------------+----------+---------+-----------+---------------------+  Basilic       Full                                                        +----------+------------+----------+---------+-----------+---------------------+  Left Findings:  +----------+------------+----------+---------+-----------+-------+ LEFT      CompressiblePropertiesPhasicitySpontaneousSummary +----------+------------+----------+---------+-----------+-------+ IJV         Partial                Yes       Yes     Acute  +----------+------------+----------+---------+-----------+-------+ Subclavian  Partial                Yes       Yes     Acute  +----------+------------+----------+---------+-----------+-------+ Axillary      Full                 Yes       Yes            +----------+------------+----------+---------+-----------+-------+ Brachial      Full                 Yes       Yes            +----------+------------+----------+---------+-----------+-------+ Radial        Full                                          +----------+------------+----------+---------+-----------+-------+ Ulnar         Full                                          +----------+------------+----------+---------+-----------+-------+ Cephalic      Full                                          +----------+------------+----------+---------+-----------+-------+ Basilic       Full                                          +----------+------------+----------+---------+-----------+-------+  Summary:  Right: No evidence of deep vein thrombosis in the upper extremity. No evidence of superficial vein thrombosis in the upper extremity.  Left: No evidence of superficial vein thrombosis in the upper extremity. Findings consistent with acute deep vein thrombosis involving the left internal jugular veins and left subclavian veins.  *See table(s) above for measurements and observations.  Diagnosing physician: Quay Burow MD Electronically signed by Quay Burow MD on 03/12/2018 at 4:53:40 PM.    Final    [place] Ekg Site Rite  Result Date: 03/14/2018 If Site Rite image not attached, placement could not be confirmed due to current cardiac  rhythm.   ASSESSMENT & PLAN:   22 y.o. 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 Elevated LDH level consistent with a high-grade lymphoma-like process CBC not markedly abnormal at this time. No peripheral blood leukocytosis/lymphocytosis  at this time. 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 #7 Fever - due to DVT vs post-obstructive pneumonia vs due to lymphoma/pleuritis   PLAN:  -patient notes improvement in breathing and appetite with steroids and night sweats resolved. -Completed 4 doses of Dexamethasone 20mg  daily - discussed all available results in details with the patient and his mother at bedside. ECHO nl EF. CT abd -no LNadenopathy. -hep B, Hep C and HIV neg Chemotherapy regimen EPOCH-R , G-CSF use was discussed in details including potential side effects, treatment goals and treatment strategy and followup plans. All questions answered and informed consent obtained. -continue allopurinol 100mg  More[place] BID for TLS prophylaxis -cbc/diff, cmp, uric and mg.phosphorus ddaily x 5 days - septic w/u for fever- f/u Bl Cx.  --would recommend Abx for empiric coverage for post-obstructive pneumonia - defer to hospitalist (was on cefepime/vancomycin) given use of chemotherapy. - would recommend Lovenox for anticoagulation for his  PE and DVT which are malignancy related (ordered to start lovenox instead of eliquis) and to discharge on lovenox on discharge. Would consider transition to Eliquis in 2 months once clot burden improved. -nutritional support -will continue to follow daily -will complete EPOCH-R in 5 days and would anticipate discharge after that if stable.  The total time spent in the appt was 60 minutes and more than 50% was on counseling and direct patient cares.    Sullivan Lone MD [place] AAHIVMS [facility] Hematology/Oncology Physician [facility]  (Office):       [phone] (Work cell):  [phone] (Fax):           [phone]  03/15/2018 10:36 AM

## 2018-03-16 LAB — CBC WITH DIFFERENTIAL/PLATELET
Abs Immature Granulocytes: 0.11 10*3/uL — ABNORMAL HIGH (ref 0.00–0.07)
Basophils Absolute: 0 10*3/uL (ref 0.0–0.1)
Basophils Relative: 0 %
Eosinophils Absolute: 0 10*3/uL (ref 0.0–0.5)
Eosinophils Relative: 0 %
HCT: 34.5 % — ABNORMAL LOW (ref 39.0–52.0)
Hemoglobin: 10.8 g/dL — ABNORMAL LOW (ref 13.0–17.0)
Immature Granulocytes: 1 %
Lymphocytes Relative: 5 %
Lymphs Abs: 0.6 10*3/uL — ABNORMAL LOW (ref 0.7–4.0)
MCH: 25.7 pg — ABNORMAL LOW (ref 26.0–34.0)
MCHC: 31.3 g/dL (ref 30.0–36.0)
MCV: 82.1 fL (ref 80.0–100.0)
Monocytes Absolute: 0.6 10*3/uL (ref 0.1–1.0)
Monocytes Relative: 5 %
NEUTROS PCT: 89 %
Neutro Abs: 12.5 10*3/uL — ABNORMAL HIGH (ref 1.7–7.7)
Platelets: 435 10*3/uL — ABNORMAL HIGH (ref 150–400)
RBC: 4.2 MIL/uL — ABNORMAL LOW (ref 4.22–5.81)
RDW: 13.8 % (ref 11.5–15.5)
WBC: 13.9 10*3/uL — ABNORMAL HIGH (ref 4.0–10.5)
nRBC: 0.1 % (ref 0.0–0.2)

## 2018-03-16 LAB — COMPREHENSIVE METABOLIC PANEL
ALBUMIN: 2.4 g/dL — AB (ref 3.5–5.0)
ALT: 73 U/L — ABNORMAL HIGH (ref 0–44)
AST: 40 U/L (ref 15–41)
Alkaline Phosphatase: 99 U/L (ref 38–126)
Anion gap: 9 (ref 5–15)
BILIRUBIN TOTAL: 0.3 mg/dL (ref 0.3–1.2)
BUN: 14 mg/dL (ref 6–20)
CO2: 26 mmol/L (ref 22–32)
Calcium: 8.6 mg/dL — ABNORMAL LOW (ref 8.9–10.3)
Chloride: 102 mmol/L (ref 98–111)
Creatinine, Ser: 0.69 mg/dL (ref 0.61–1.24)
GFR calc Af Amer: >60 mL/min (ref >60)
GFR calc non Af Amer: >60 mL/min (ref >60)
GLUCOSE: 122 mg/dL — AB (ref 70–99)
Potassium: 4 mmol/L (ref 3.5–5.1)
Sodium: 137 mmol/L (ref 135–145)
Total Protein: 5.9 g/dL — ABNORMAL LOW (ref 6.5–8.1)

## 2018-03-16 LAB — URIC ACID: Uric Acid, Serum: 3.2 mg/dL — ABNORMAL LOW (ref 3.7–8.6)

## 2018-03-16 LAB — LACTATE DEHYDROGENASE: LDH: 313 U/L — ABNORMAL HIGH (ref 98–192)

## 2018-03-16 LAB — MAGNESIUM: Magnesium: 1.9 mg/dL (ref 1.7–2.4)

## 2018-03-16 LAB — PHOSPHORUS: Phosphorus: 4.4 mg/dL (ref 2.5–4.6)

## 2018-03-16 NOTE — Progress Notes (Signed)
Chemotherapy dosage verified with Clotilde Dieter RN

## 2018-03-16 NOTE — Progress Notes (Signed)
chemotherapy dosage verified with Cindy Hughey, RN  

## 2018-03-16 NOTE — Progress Notes (Signed)
Patient ID: Nicholas Moreno, male   DOB: October 05, 1995, 22 y.o.   MRN: [id number]  PROGRESS NOTE    Nicholas Moreno  ZHY:[id number] DOB: January 06, 1996 DOA: 03/07/2018 PCP: Patient, No Pcp Per   Brief Narrative:  22 year old Filipino male presented with shortness of breath and was found to have large mediastinal mass, left pleural effusion and left pulmonary embolism.  Started on heparin drip and underwent left-sided thoracentesis by PCCM.  Oncology was consulted.  Underwent VATS and mediastinal mass biopsy by CT surgery.  Status post chest tube placement and removal.  Biopsy confirmed high-grade B-cell lymphoma.  He was also found to have left IJ and subclavian vein DVT.  He was also started on antibiotics for fever.  He was transferred to [facility] long hospital to initiate chemotherapy as per oncology recommendations.   Assessment & Plan:   Principal Problem:   Lymphoma of intrathoracic lymph nodes (HCC) Active Problems:   Mediastinal mass   Single subsegmental pulmonary embolism without acute cor pulmonale   Superior vena cava syndrome   S/P thoracentesis   Malnutrition of moderate degree   Counseling regarding advance care planning and goals of care   Acute deep vein thrombosis (DVT) of left upper extremity ([place])   Encounter for antineoplastic chemotherapy   High-grade B-cell lymphoma/mediastinal mass -He underwent VATS with biopsy of mediastinal mass and biopsies of the right lower lobe and left upper lobe masses: Biopsy showed high-grade B-cell lymphoma. -No lymphadenopathy in the abdomen or pelvis and CT of abdomen/pelvis -Status post chest tube placement and removal -Oncology following: Completed 3-day course of dexamethasone.   -Patient was started on chemotherapy on 03/15/2018 per oncology  Fever -Patient was started on cefepime and vancomycin on 03/14/2018 for fever the day prior.  He has been afebrile since then.  No evidence of any infection although CT of the abdomen showed  findings suggestive of pyelonephritis.  UA was negative and patient does not have urinary symptoms.   -Antibiotics were discontinued on 03/15/2018.  Monitor off antibiotics.  Currently afebrile.  Cultures negative so far  Left upper extremity DVT with single subsegmental pulmonary embolism -Patient has left IJ and subclavian DVT.  Initially started on heparin drip which was switched to Eliquis by oncology and subsequently switched to Lovenox by oncology.  Oncology is recommending Lovenox for now and transition to Eliquis in 2 months.  Superior vena cava compression from mass -Patient has completed course of dexamethasone  Leukocytosis - Monitor  Left pleural effusion -Status post thoracentesis -Status post VATS and chest tube placement and removal -Chest x-ray from 03/14/2018 had shown slight decrease in left-sided hydropneumothorax and CT surgery was aware of this   DVT prophylaxis: Lovenox Code Status: Full Family Communication: None at bedside Disposition Plan: Depends on clinical outcome and oncology recommendations  Consultants: Oncology/CT surgery/PCCM  Procedures: Thoracentesis, VATS procedure, chest tube placement and removal  Antimicrobials:  Anti-infectives (From admission, onward)   Start     Dose/Rate Route Frequency Ordered Stop   03/14/18 2000  vancomycin (VANCOCIN) IVPB 750 mg/150 ml premix  Status:  Discontinued     750 mg 150 mL/hr over 60 Minutes Intravenous Every 12 hours 03/14/18 0755 03/15/18 1141   03/14/18 0800  ceFEPIme (MAXIPIME) 1 g in sodium chloride 0.9 % 100 mL IVPB  Status:  Discontinued     1 g 200 mL/hr over 30 Minutes Intravenous Every 8 hours 03/14/18 0755 03/15/18 1141   03/14/18 0800  vancomycin (VANCOCIN) 1,250 mg in sodium chloride 0.9 % 250  mL IVPB     1,250 mg 166.7 mL/hr over 90 Minutes Intravenous  Once 03/14/18 0755 03/14/18 1156   03/10/18 2330  ceFAZolin (ANCEF) IVPB 2g/100 mL premix     2 g 200 mL/hr over 30 Minutes Intravenous  Every 8 hours 03/10/18 2001 03/11/18 0739   03/09/18 1319  ceFAZolin (ANCEF) IVPB 2g/100 mL premix     2 g 200 mL/hr over 30 Minutes Intravenous 30 min pre-op 03/09/18 1319 03/10/18 1525        Subjective: Patient seen and examined at bedside.  Denies worsening shortness of breath, cough or fevers.  No overnight vomiting.  Tolerated chemotherapy well  objective: Vitals:   03/15/18 0429 03/15/18 1358 03/15/18 2026 03/16/18 0400  BP: 112/61 97/64 104/62 106/64  Pulse: 93 99 78 79  Resp: 14 16 12 14   Temp: 98.8 F (37.1 C) 98.7 F (37.1 C) 98.2 F (36.8 C) 98.4 F (36.9 C)  TempSrc: Oral Oral Oral Oral  SpO2: 98% 98% 95% 97%  Weight:      Height:        Intake/Output Summary (Last 24 hours) at 03/16/2018 1106 Last data filed at 03/15/2018 1413 Gross per 24 hour  Intake 548.26 ml  Output -  Net 548.26 ml   Filed Weights   03/07/18 2000 03/08/18 0456 03/10/18 1344  Weight: 52.9 kg 52.9 kg 52.9 kg    Examination:  General exam: Appears calm and comfortable.  No distress.  22 built young male. Respiratory system: Bilateral decreased breath sounds at bases with some scattered crackles, no wheezing Cardiovascular system: S1 & S2 heard, Rate controlled Gastrointestinal system: Abdomen is nondistended, soft and nontender. Normal bowel sounds heard. Extremities: No cyanosis, edema      Data Reviewed: I have personally reviewed following labs and imaging studies  CBC: Recent Labs  Lab 03/10/18 0143 03/10/18 0447  03/12/18 0530 03/13/18 0923 03/14/18 0500 03/15/18 1639 03/16/18 0427  WBC 17.5* 16.3*   < > 11.8* 11.4* 10.0 12.7* 13.9*  NEUTROABS 15.8* 14.7*  --   --   --  8.6* 11.9* 12.5*  HGB 12.0* 12.8*   < > 11.4* 12.1* 11.5* 12.1* 10.8*  HCT 38.3* 40.2   < > 37.4* 39.0 36.6* 38.8* 34.5*  MCV 79.3* 79.4*   < > 81.1 81.1 81.2 81.5 82.1  PLT 597* 550*   < > 451* 499* 435* 499* 435*   < > = values in this interval not displayed.   Basic Metabolic  Panel: Recent Labs  Lab 03/11/18 0543 03/12/18 0530 03/14/18 0500 03/15/18 1639 03/16/18 0427  NA 137 138 139 138 137  K 4.3 4.2 3.9 4.5 4.0  CL 103 103 102 105 102  CO2 24 28 27 25 26   GLUCOSE 117* 108* 95 117* 122*  BUN 16 17 9 10 14   CREATININE 0.81 0.91 0.82 0.70 0.69  CALCIUM 8.6* 8.6* 8.4* 8.5* 8.6*  MG  --   --   --  2.2 1.9  PHOS  --   --   --  3.3 4.4   GFR: Estimated Creatinine Clearance: 109.3 mL/min (by C-G formula based on SCr of 0.69 mg/dL). Liver Function Tests: Recent Labs  Lab 03/10/18 0447 03/12/18 0530 03/15/18 1639 03/16/18 0427  AST 20 26 51* 40  ALT 26 21 82* 73*  ALKPHOS 52 46 101 99  BILITOT 0.2* 0.3 0.5 0.3  PROT 5.9* 5.1* 6.2* 5.9*  ALBUMIN 2.6* 2.3* 2.6* 2.4*   No results for input(s): LIPASE,  AMYLASE in the last 168 hours. No results for input(s): AMMONIA in the last 168 hours. Coagulation Profile: Recent Labs  Lab 03/10/18 0143  INR 1.09   Cardiac Enzymes: No results for input(s): CKTOTAL, CKMB, CKMBINDEX, TROPONINI in the last 168 hours. BNP (last 3 results) No results for input(s): PROBNP in the last 8760 hours. HbA1C: No results for input(s): HGBA1C in the last 72 hours. CBG: Recent Labs  Lab 03/10/18 2135 03/10/18 2322 03/11/18 0315 03/11/18 0512 03/11/18 1129  GLUCAP 118* 111* 114* 117* 122*   Lipid Profile: No results for input(s): CHOL, HDL, LDLCALC, TRIG, CHOLHDL, LDLDIRECT in the last 72 hours. Thyroid Function Tests: No results for input(s): TSH, T4TOTAL, FREET4, T3FREE, THYROIDAB in the last 72 hours. Anemia Panel: No results for input(s): VITAMINB12, FOLATE, FERRITIN, TIBC, IRON, RETICCTPCT in the last 72 hours. Sepsis Labs: No results for input(s): PROCALCITON, LATICACIDVEN in the last 168 hours.  Recent Results (from the past 240 hour(s))  MRSA PCR Screening     Status: None   Collection Time: 03/07/18  7:58 PM  Result Value Ref Range Status   MRSA by PCR NEGATIVE NEGATIVE Final    Comment:        The  GeneXpert MRSA Assay (FDA approved for NASAL specimens only), is one component of a comprehensive MRSA colonization surveillance program. It is not intended to diagnose MRSA infection nor to guide or monitor treatment for MRSA infections. Performed at [facility], [place] [address] [place], [place] [address]   Body fluid culture     Status: None   Collection Time: 03/08/18  3:24 PM  Result Value Ref Range Status   Specimen Description PLEURAL LEFT  Final   Special Requests NONE  Final   Gram Stain   Final    ABUNDANT WBC PRESENT, PREDOMINANTLY MONONUCLEAR NO ORGANISMS SEEN    Culture   Final    NO GROWTH 3 DAYS Performed at [facility], [place] [address], [place], [place] [address]    Report Status 03/11/2018 FINAL  Final  Surgical pcr screen     Status: Abnormal   Collection Time: 03/09/18  1:43 PM  Result Value Ref Range Status   MRSA, PCR NEGATIVE NEGATIVE Final   Staphylococcus aureus POSITIVE (A) NEGATIVE Final    Comment: (NOTE) The Xpert SA Assay (FDA approved for NASAL specimens in patients 73 years of age and older), is one component of a comprehensive surveillance program. It is not intended to diagnose infection nor to guide or monitor treatment.   Anaerobic culture     Status: None   Collection Time: 03/10/18  4:09 PM  Result Value Ref Range Status   Specimen Description BRONCHIAL WASHINGS LEFT  Final   Special Requests NONE  Final   Culture   Final    NO ANAEROBES ISOLATED Performed at [facility], [address], [place], [place] [address]    Report Status 03/15/2018 FINAL  Final  Culture, fungus without smear     Status: None (Preliminary result)   Collection Time: 03/10/18  4:09 PM  Result Value Ref Range Status   Specimen Description BRONCHIAL WASHINGS LEFT  Final   Special Requests NONE  Final   Culture   Final    NO FUNGUS ISOLATED AFTER 3 DAYS Performed at [facility], [place] [address], [place], [place] [address]     Report Status PENDING  Incomplete  Acid Fast Smear (AFB)     Status: None  Collection Time: 03/10/18  4:09 PM  Result Value Ref Range Status   AFB Specimen Processing Concentration  Final   Acid Fast Smear Negative  Final    Comment: (NOTE) Performed At: [facility] [place], [place] [id number] Rush Farmer MD [id number]    Source (AFB) BRONCHIAL WASHINGS  Final    Comment: LEFT Performed at [facility], [place] [address] [place], [place] [address]   Culture, respiratory     Status: None   Collection Time: 03/10/18  4:09 PM  Result Value Ref Range Status   Specimen Description BRONCHIAL WASHINGS LEFT  Final   Special Requests NONE  Final   Gram Stain   Final    FEW WBC PRESENT, PREDOMINANTLY PMN NO ORGANISMS SEEN    Culture   Final    RARE Consistent with normal respiratory flora. Performed at [facility], [place] [address], [place], [place] [address]    Report Status 03/12/2018 FINAL  Final  Culture, blood (Routine X 2) w Reflex to ID Panel     Status: None (Preliminary result)   Collection Time: 03/14/18  7:36 AM  Result Value Ref Range Status   Specimen Description BLOOD LEFT HAND  Final   Special Requests   Final    BOTTLES DRAWN AEROBIC AND ANAEROBIC Blood Culture adequate volume   Culture   Final    NO GROWTH 1 DAY Performed at [facility], [place] [address], [place], [place] [address]    Report Status PENDING  Incomplete  Culture, blood (Routine X 2) w Reflex to ID Panel     Status: None (Preliminary result)   Collection Time: 03/14/18  7:37 AM  Result Value Ref Range Status   Specimen Description BLOOD RIGHT ANTECUBITAL  Final   Special Requests   Final    BOTTLES DRAWN AEROBIC AND ANAEROBIC Blood Culture adequate volume   Culture   Final    NO GROWTH 1 DAY Performed at [facility], [place] [address] [place], [place] [address]    Report Status PENDING  Incomplete  Urine Culture     Status: None   Collection  Time: 03/14/18  2:58 PM  Result Value Ref Range Status   Specimen Description URINE, CLEAN CATCH  Final   Special Requests Immunocompromised  Final   Culture   Final    NO GROWTH Performed at [facility], [place] [address] [place], [place] [address]    Report Status 03/15/2018 FINAL  Final         Radiology Studies: [place] Ekg Site Rite  Result Date: 03/14/2018 If Site Rite image not attached, placement could not be confirmed due to current cardiac rhythm.       Scheduled Meds: . allopurinol  100 mg Oral BID  . bisacodyl  10 mg Oral Daily  . dextromethorphan-guaiFENesin  1 tablet Oral BID  . 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 enoxaparin (LOVENOX) injection  1 mg/kg Subcutaneous Q12H  . feeding supplement (ENSURE ENLIVE)  237 mL Oral BID BM  . multivitamin with minerals  1 tablet Oral Daily  . pantoprazole  40 mg Oral Daily  . polyethylene glycol  17 g Oral Daily  . predniSONE  60 mg Oral QAC breakfast  . sodium chloride flush  10-40 mL Intracatheter Q12H   Continuous Infusions: . sodium chloride  Stopped (03/15/18 1409)  . 0.9 % NaCl with KCl 20 mEq / L Stopped (03/15/18 1045)  . ondansetron (ZOFRAN) with dexamethasone (DECADRON) IV    . potassium chloride       LOS: 9 days        [name], MD [facility] Pager [phone]  If 7PM-7AM, please contact night-coverage [email or web address] Password [email or web address] 03/16/2018, 11:06 AM

## 2018-03-16 NOTE — Progress Notes (Signed)
HEMATOLOGY/ONCOLOGY INPATIENT PROGRESS NOTE  Date of Service: 03/16/2018  Inpatient Attending: .[name], MD   SUBJECTIVE:   Felipa Eth notes he is feeling better today. No nausea/vomting or other overt toxicity from [facility] C1D1 treatment. Notes improved po intake. No further fevers. Breathing improving.  On review of systems, pt reports slightly improved breathing, pain while coughing, and denies testicular pain or swelling, leg swelling, and any other symptoms.   OBJECTIVE:  Mild discomfort from previous chest tube  PHYSICAL EXAMINATION: . Vitals:   03/15/18 2026 03/16/18 0400 03/16/18 1339 03/16/18 2027  BP: 104/62 106/64 93/63 97/66   Pulse: 78 79 80 64  Resp: 12 14 16 14   Temp: 98.2 F (36.8 C) 98.4 F (36.9 C) 97.8 F (36.6 C) 97.7 F (36.5 C)  TempSrc: Oral Oral Oral Oral  SpO2: 95% 97% 95% 100%  Weight:      Height:       Filed Weights   03/07/18 2000 03/08/18 0456 03/10/18 1344  Weight: 116 lb 10 oz (52.9 kg) 116 lb 10 oz (52.9 kg) 116 lb 10 oz (52.9 kg)   .Body mass index is 18.83 kg/m.  Marland Kitchen GENERAL:alert, in no acute distress and comfortable SKIN: no acute rashes, no significant lesions EYES: conjunctiva are pink and non-injected, sclera anicteric OROPHARYNX: MMM, no exudates, no oropharyngeal erythema or ulceration NECK: supple, no JVD LYMPH:  no palpable lymphadenopathy in the cervical, axillary or inguinal regions LUNGS: decreased  Air entry left lung fields. HEART: regular rate & rhythm ABDOMEN:  normoactive bowel sounds , non tender, not distended. Extremity: no pedal edema PSYCH: alert & oriented x 3 with fluent speech NEURO: no focal motor/sensory deficits   MEDICAL HISTORY:  History reviewed. No pertinent past medical history.  SURGICAL HISTORY: Past Surgical History:  Procedure Laterality Date  . MEDIASTINOSCOPY N/A 03/10/2018   Procedure: MEDIASTINOSCOPY;  Surgeon: Ivin Poot, MD;  Location: [place];  Service: Thoracic;   Laterality: N/A;  . VIDEO ASSISTED THORACOSCOPY (VATS)/ LYMPH NODE SAMPLING Left 03/10/2018   Procedure: VIDEO ASSISTED THORACOSCOPY (VATS)/ BIOSPY ANTERIOR APPROACH;  Surgeon: Ivin Poot, MD;  Location: [place];  Service: Thoracic;  Laterality: Left;  Marland Kitchen VIDEO BRONCHOSCOPY Left 03/10/2018   Procedure: VIDEO BRONCHOSCOPY;  Surgeon: Ivin Poot, MD;  Location: [facility] OR;  Service: Thoracic;  Laterality: Left;    SOCIAL HISTORY: Social History   Socioeconomic History  . Marital status: Single    Spouse name: Not on file  . Number of children: Not on file  . Years of education: Not on file  . Highest education level: Not on file  Occupational History  . Not on file  Social Needs  . Financial resource strain: Not on file  . Food insecurity:    Worry: Not on file    Inability: Not on file  . Transportation needs:    Medical: Not on file    Non-medical: Not on file  Tobacco Use  . Smoking status: Never Smoker  . Smokeless tobacco: Never Used  Substance and Sexual Activity  . Alcohol use: Not Currently  . Drug use: Not Currently  . Sexual activity: Not on file  Lifestyle  . Physical activity:    Days per week: Not on file    Minutes per session: Not on file  . Stress: Not on file  Relationships  . Social connections:    Talks on phone: Not on file    Gets together: Not on file    Attends religious  service: Not on file    Active member of club or organization: Not on file    Attends meetings of clubs or organizations: Not on file    Relationship status: Not on file  . Intimate partner violence:    Fear of current or ex partner: Not on file    Emotionally abused: Not on file    Physically abused: Not on file    Forced sexual activity: Not on file  Other Topics Concern  . Not on file  Social History Narrative  . Not on file    FAMILY HISTORY: History reviewed. No pertinent family history.  ALLERGIES:  has No Known Allergies.  MEDICATIONS:  Scheduled Meds: .  allopurinol  100 mg Oral BID  . bisacodyl  10 mg Oral Daily  . dextromethorphan-guaiFENesin  1 tablet Oral BID  . 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 enoxaparin (LOVENOX) injection  1 mg/kg Subcutaneous Q12H  . feeding supplement (ENSURE ENLIVE)  237 mL Oral BID BM  . multivitamin with minerals  1 tablet Oral Daily  . pantoprazole  40 mg Oral Daily  . polyethylene glycol  17 g Oral Daily  . predniSONE  60 mg Oral QAC breakfast  . sodium chloride flush  10-40 mL Intracatheter Q12H   Continuous Infusions: . sodium chloride Stopped (03/15/18 1409)  . 0.9 % NaCl with KCl 20 mEq / L Stopped (03/15/18 1045)  . potassium chloride     PRN Meds:.alteplase, Cold Pack, fentaNYL (SUBLIMAZE) injection, heparin lock flush, heparin lock flush, Hot Pack, Influenza vac split quadrivalent PF, ondansetron (ZOFRAN) IV, potassium chloride, sodium chloride flush, sodium chloride flush, sodium chloride flush, traMADol  REVIEW OF SYSTEMS:    .10 Point review of Systems was done is negative except as noted above.    LABORATORY DATA:  I have reviewed the data as listed  . CBC Latest Ref Rng & Units 03/16/2018 03/15/2018 03/14/2018  WBC 4.0 - 10.5 K/uL 13.9(H) 12.7(H) 10.0  Hemoglobin 13.0 - 17.0 g/dL 10.8(L) 12.1(L) 11.5(L)  Hematocrit 39.0 - 52.0 % 34.5(L) 38.8(L) 36.6(L)  Platelets 150 - 400 K/uL 435(H) 499(H) 435(H)    . CMP Latest Ref Rng & Units 03/16/2018 03/15/2018 03/14/2018  Glucose 70 - 99 mg/dL 122(H) 117(H) 95  BUN 6 - 20 mg/dL 14 10 9   Creatinine 0.61 - 1.24 mg/dL 0.69 0.70 0.82  Sodium 135 - 145 mmol/L 137 138 139  Potassium 3.5 - 5.1 mmol/L 4.0 4.5 3.9  Chloride 98 - 111 mmol/L 102 105 102  CO2 22 - 32 mmol/L 26 25 27   Calcium 8.9 - 10.3 mg/dL 8.6(L) 8.5(L) 8.4(L)  Total Protein 6.5 - 8.1 g/dL 5.9(L) 6.2(L) -  Total Bilirubin 0.3 - 1.2 mg/dL 0.3 0.5 -  Alkaline Phos 38 - 126 U/L 99 101 -  AST 15 - 41 U/L 40 51(H) -  ALT 0 - 44  U/L 73(H) 82(H) -   Component     Latest Ref Rng & Units 03/07/2018 03/08/2018  HIV Screen 4th Generation wRfx     Non Reactive Non Reactive   HCV Ab     0.0 - 0.9 s/co ratio  <0.1  Hepatitis B Surface Ag     Negative  Negative  Hep B Core Ab, Tot     Negative  Negative   Component     Latest Ref Rng & Units 03/16/2018  Magnesium     1.7 - 2.4 mg/dL 1.9  Uric Acid,  Serum     3.7 - 8.6 mg/dL 3.2 (L)  Phosphorus     2.5 - 4.6 mg/dL 4.4  LDH     98 - 192 U/L 313 (H)    RADIOGRAPHIC STUDIES: I have personally reviewed the radiological images as listed and agreed with the findings in the report. Dg Chest 2 View  Result Date: 03/14/2018 CLINICAL DATA:  Shortness of breath EXAM: CHEST - 2 VIEW COMPARISON:  03/13/2018 FINDINGS: Right jugular central line is again seen. Left-sided apical pneumothorax is again noted and slightly improved when compared with the prior exam. Persistent mediastinal masses are noted stable from the prior exam. Persistent small left pleural effusion is noted as well as left basilar consolidation. IMPRESSION: Slight decrease in left-sided hydropneumothorax. Persistent mediastinal masses with evidence of left basilar consolidation. Electronically Signed   By: Inez Catalina M.D.   On: 03/14/2018 07:16   Dg Chest 2 View  Result Date: 03/13/2018 CLINICAL DATA:  Follow-up LEFT hydropneumothorax after chest tube removal. EXAM: CHEST - 2 VIEW COMPARISON:  03/12/2018 dating back to 03/08/2018. CT chest 03/07/2018. FINDINGS: RIGHT jugular central venous catheter tip projects over the LOWER SVC, unchanged stable LEFT apicolateral pneumothorax since yesterday, on the order of 15% or so. Loculated LEFT pleural effusion with air-fluid levels, unchanged. Massive mediastinal mass with compression of the LEFT lung and compression of the MEDIAL RIGHT UPPER LOBE as noted on the prior CT. Slight improved aeration in the LEFT UPPER LOBE since yesterday. No new abnormalities. IMPRESSION:  1. Stable LEFT apicolateral pneumothorax and loculated LEFT hydropneumothorax after chest tube removal. 2. Slight improved aeration in the LEFT UPPER LOBE since yesterday. 3. Otherwise, no change in the massive mediastinal mass with compressive atelectasis involving much of the LEFT lung and the MEDIAL RIGHT UPPER LOBE. 4. No new abnormalities. Electronically Signed   By: Evangeline Dakin M.D.   On: 03/13/2018 09:17   Ct Angio Chest Pe W And/or Wo Contrast  Result Date: 03/07/2018 CLINICAL DATA:  Shortness of breath and cough. EXAM: CT ANGIOGRAPHY CHEST WITH CONTRAST TECHNIQUE: Multidetector CT imaging of the chest was performed using the standard protocol during bolus administration of intravenous contrast. Multiplanar CT image reconstructions and MIPs were obtained to evaluate the vascular anatomy. CONTRAST:  179mL ISOVUE-370 IOPAMIDOL (ISOVUE-370) INJECTION 76% COMPARISON:  None. FINDINGS: Cardiovascular: Normal heart size. The main pulmonary artery appears patent. Filling defect within the left upper lobe lobar pulmonary artery, image 104 through image 101 compatible with acute pulmonary emboli. Mediastinum/Nodes: A very large anterior mediastinal mass is identified which extends into both upper lung zones. This mass encases and narrows the superior vena cava as well as the branch vessels off the aortic arch. There is extensive chest wall collaterals especially in the left supraclavicular region and paraspinal region. Encasement and narrowing of the left main pulmonary artery and bilateral upper lobe pulmonary arteries. Encasement and narrowing of the trachea is identified with rightward and posterior displacement. The left mainstem bronchus is narrowed, and bilateral upper lobe bronchi are occluded. Narrowing of the left lower lobe bronchi. Lungs/Pleura: There is a moderate left pleural effusion. Approximately 90% opacification of the left upper lobe is identified secondary to tumor and postobstructive  consolidation. A small amount of aerated lung within the apex. Compressive type atelectasis is noted within the left lung base. There is approximately 50% opacification of the right upper lobe with patchy areas of airspace consolidation and ground-glass attenuation within the remaining portions of the right upper lobe. The mass  within the right upper lobe contains central areas of cavitation compatible with necrosis. Upper Abdomen: No acute findings. Musculoskeletal: No chest wall abnormality. No acute or significant osseous findings. Review of the MIP images confirms the above findings. IMPRESSION: 1. Small acute pulmonary emboli identified within the distal left upper lobe lobar pulmonary artery. 2. 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 Primary differential considerations include lymphoma and leukemia as well as malignant derm cell tumors. 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 Narrowing and displacement of the trachea with complete occlusion of the upper lobe bronchi. There is also marked narrowing of bilateral upper lobe pulmonary arteries. 3. Significantly diminished aeration to both upper lobes, left greater than right. 4. Moderate left pleural effusion. Critical Value/emergent results were called by telephone at the time of interpretation on 03/07/2018 at 5:29 pm to Dr. Carlisle Cater , who verbally acknowledged these results. Electronically Signed   By: Kerby Moors M.D.   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 Status post mediastinoscopy and VATS. EXAM: CT ABDOMEN AND PELVIS WITH CONTRAST TECHNIQUE: Multidetector CT imaging of the abdomen and pelvis was performed using the standard  protocol following bolus administration of intravenous contrast. CONTRAST:  158mL OMNIPAQUE IOHEXOL 300 MG/ML  SOLN COMPARISON:  Recent chest CT 03/07/2018 FINDINGS: Lower chest: Persistent left pleural effusion containing air from recent surgery. There is also air in the subcutaneous tissues of the anterior chest wall. Left lower lobe atelectasis. The heart is normal in size. The distal esophagus is grossly normal. Small right pleural effusion. The right lung base is grossly clear. Hepatobiliary: No focal hepatic lesions or intrahepatic biliary dilatation. The gallbladder is normal. No common bile duct dilatation. Pancreas: No mass, inflammation or ductal dilatation. Spleen: Normal size.  No focal lesions. Adrenals/Urinary Tract: Adrenal glands are unremarkable. Both kidneys demonstrate patchy peripheral perfusion defects typically seen with pyelonephritis. Recommend correlation with urinalysis. Stomach/Bowel: The stomach, duodenum, small bowel and colon are grossly normal. No acute inflammatory changes, mass lesions or obstructive findings. Vascular/Lymphatic: No mesenteric or retroperitoneal mass or adenopathy. The aorta and branch vessels are normal. The major venous structures are patent. Reproductive: The prostate gland and seminal vesicles are unremarkable. 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 Musculoskeletal: No significant bony findings. No worrisome bone lesions. IMPRESSION: 1. Bilateral pleural effusions, left greater than right with left lower lobe atelectasis. There is also some pleural air and subcutaneous air on the left side consistent with recent surgery. 2. No lymphadenopathy in the abdomen or pelvis. 3. Patchy perfusion abnormalities involving both kidneys most typically seen with pyelonephritis. Recommend correlation with urinalysis. 4. Body wall  edema, mesenteric edema and free pelvic fluid as above. Electronically Signed   By: Marijo Sanes M.D.   On: 03/13/2018 19:32   Dg Chest Port 1 View  Result Date: 03/12/2018 CLINICAL DATA:  22 year old male with mediastinal mass, status post VATS and biopsy with mediastinoscopy 05/12/2017, suspected lymphoma. EXAM: PORTABLE CHEST 1 VIEW COMPARISON:  Chest x-ray 03/11/2018, 03/10/2018, 03/08/2018, CT 03/07/2018 FINDINGS: Cardiomediastinal silhouette partially obscured by lung/pleural disease/mediastinal disease, unchanged. In the interval there has been  development of left apical pneumothorax component. Opacity at the left base obscuring the left costophrenic angle in the left heart border. Unchanged left thoracostomy tube. Right IJ central venous catheter, unchanged. No right-sided confluent airspace disease. No pleural effusion or pneumothorax. IMPRESSION: Interval development of left apical pneumothorax, either ex vacuo, or representing air leak. Unchanged position of thoracostomy tube. Unchanged right IJ central venous catheter. Similar appearance of cardiomediastinal silhouette, with obscuration of the heart borders and predominantly left lung by known mediastinal mass and left lung consolidation, status post VATS and mediastinoscopy. These results were called by telephone at the time of interpretation on 03/12/2018 at 10:06 am to nurse caring for patient, Ms Lilia Pro in [facility]. Electronically Signed   By: Corrie Mckusick D.O.   On: 03/12/2018 10:07   Dg Chest Port 1 View  Result Date: 03/11/2018 CLINICAL DATA:  Postop chest biopsy.  Chest tube. EXAM: PORTABLE CHEST 1 VIEW COMPARISON:  03/10/2018 FINDINGS: Near complete opacification of the left chest again noted due to large mediastinal mass and collapse of the left lung. Left pleural effusion is present. Left chest tube remains in place and unchanged. No pneumothorax. Mediastinal mass is present crossing the midline to the right unchanged. Remainder of the  right lung is normally aerated Right jugular central venous catheter tip in the mid SVC unchanged. No pneumothorax on the right. IMPRESSION: Large mediastinal mass unchanged. Compression of the left lung with left effusion unchanged. Left chest tube in place No pneumothorax. Electronically Signed   By: Franchot Gallo M.D.   On: 03/11/2018 08:53   Dg Chest Port 1 View  Result Date: 03/10/2018 CLINICAL DATA:  Post left VATS.  Evaluate pneumothorax.  Chest tube. EXAM: PORTABLE CHEST 1 VIEW COMPARISON:  03/08/2018 FINDINGS: Interval placement of left chest tube. Continued large left pleural effusion and diffuse airspace disease throughout the left lung. No pneumothorax. Large rounded masslike opacity in the medial right upper lobe is stable. Right central line is in place with the tip in the SVC. No pneumothorax. IMPRESSION: Interval placement of left chest tube and right central line. No pneumothorax. Continued large left pleural effusion and near complete opacification of the left lung. Continued medial right upper lobe mass. Electronically Signed   By: Rolm Baptise M.D.   On: 03/10/2018 20:53   Dg Chest Port 1 View  Result Date: 03/08/2018 CLINICAL DATA:  Status post left thoracentesis EXAM: PORTABLE CHEST 1 VIEW COMPARISON:  Chest CT from 1 day prior FINDINGS: Near complete opacification of the left hemithorax with asymmetric volume loss in the left hemithorax, not substantially changed. Marked thickening of the upper mediastinum bilaterally, unchanged. Cardiac silhouette is obscured, with the heart appearing normal in size. No pneumothorax. No right pleural effusion. Persistent moderate left pleural effusion. Stable masslike opacity in the medial upper right lung with right upper lobe volume loss. IMPRESSION: 1. No pneumothorax.  Persistent moderate left pleural effusion. 2. Stable marked thickening of the upper mediastinum bilaterally compatible with known infiltrative anterior mediastinal mass. 3.  Persistent volume loss in the left hemithorax and right upper lung. Stable near complete left lung opacification and masslike medial right upper lung opacity compatible with a combination of atelectasis and tumor. Electronically Signed   By: Ilona Sorrel M.D.   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 Comparison  Study: No prior study on file for comparison Performing Technologist: Sharion Dove RVS  Examination Guidelines: A complete evaluation includes B-mode imaging, spectral Doppler, color Doppler, and power Doppler as needed of all accessible portions of each vessel. Bilateral testing is considered an integral part of a complete examination. Limited examinations for reoccurring indications may be performed as noted.  Right Venous Findings: +---------+---------------+---------+-----------+----------+-------+          CompressibilityPhasicitySpontaneityPropertiesSummary +---------+---------------+---------+-----------+----------+-------+ CFV      Full           Yes      Yes                          +---------+---------------+---------+-----------+----------+-------+ SFJ      Full                                                 +---------+---------------+---------+-----------+----------+-------+ FV Prox  Full                                                 +---------+---------------+---------+-----------+----------+-------+ FV Mid   Full                                                 +---------+---------------+---------+-----------+----------+-------+ FV DistalFull                                                 +---------+---------------+---------+-----------+----------+-------+ PFV      Full                                                  +---------+---------------+---------+-----------+----------+-------+ POP      Full           Yes      Yes                          +---------+---------------+---------+-----------+----------+-------+ PTV      Full                                                 +---------+---------------+---------+-----------+----------+-------+ PERO     Full                                                 +---------+---------------+---------+-----------+----------+-------+  Left Venous Findings: +---------+---------------+---------+-----------+----------+-------+          CompressibilityPhasicitySpontaneityPropertiesSummary +---------+---------------+---------+-----------+----------+-------+ CFV      Full           Yes      Yes                          +---------+---------------+---------+-----------+----------+-------+  SFJ      Full                                                 +---------+---------------+---------+-----------+----------+-------+ FV Prox  Full                                                 +---------+---------------+---------+-----------+----------+-------+ FV Mid   Full                                                 +---------+---------------+---------+-----------+----------+-------+ FV DistalFull                                                 +---------+---------------+---------+-----------+----------+-------+ PFV      Full                                                 +---------+---------------+---------+-----------+----------+-------+ POP      Full           Yes      Yes                          +---------+---------------+---------+-----------+----------+-------+ PTV      Full                                                 +---------+---------------+---------+-----------+----------+-------+ PERO     Full                                                  +---------+---------------+---------+-----------+----------+-------+    Summary: Right: There is no evidence of deep vein thrombosis in the lower extremity. Sluggish flow noted throughout Left: There is no evidence of deep vein thrombosis in the lower extremity. Sluggish flow noted throughout  *See table(s) above for measurements and observations. Electronically signed by Servando Snare MD on 03/09/2018 at 8:52:35 PM.    Final    Vas [place] Upper Extremity Venous Duplex  Result Date: 03/12/2018 UPPER VENOUS STUDY  Indications: Edema, and SVC syndrome, chest mass Performing Technologist: Landry Mellow RDMS, RVT  Examination Guidelines: A complete evaluation includes B-mode imaging, spectral Doppler, color Doppler, and power Doppler as needed of all accessible portions of each vessel. Bilateral testing is considered an integral part of a complete examination. Limited examinations for reoccurring indications may be performed as noted.  Right Findings: +----------+------------+----------+---------+-----------+---------------------+ RIGHT     CompressiblePropertiesPhasicitySpontaneous       Summary        +----------+------------+----------+---------+-----------+---------------------+ IJV  unable to image due                                                           to IJ line       +----------+------------+----------+---------+-----------+---------------------+ Subclavian    Full                 Yes       Yes                          +----------+------------+----------+---------+-----------+---------------------+ Axillary      Full                 Yes       Yes                          +----------+------------+----------+---------+-----------+---------------------+ Brachial      Full                 Yes       Yes                          +----------+------------+----------+---------+-----------+---------------------+ Radial         Full                 Yes       Yes                          +----------+------------+----------+---------+-----------+---------------------+ Ulnar         Full                                                        +----------+------------+----------+---------+-----------+---------------------+ Cephalic      Full                                                        +----------+------------+----------+---------+-----------+---------------------+ Basilic       Full                                                        +----------+------------+----------+---------+-----------+---------------------+  Left Findings: +----------+------------+----------+---------+-----------+-------+ LEFT      CompressiblePropertiesPhasicitySpontaneousSummary +----------+------------+----------+---------+-----------+-------+ IJV         Partial                Yes       Yes     Acute  +----------+------------+----------+---------+-----------+-------+ Subclavian  Partial                Yes       Yes     Acute  +----------+------------+----------+---------+-----------+-------+ Axillary      Full  Yes       Yes            +----------+------------+----------+---------+-----------+-------+ Brachial      Full                 Yes       Yes            +----------+------------+----------+---------+-----------+-------+ Radial        Full                                          +----------+------------+----------+---------+-----------+-------+ Ulnar         Full                                          +----------+------------+----------+---------+-----------+-------+ Cephalic      Full                                          +----------+------------+----------+---------+-----------+-------+ Basilic       Full                                          +----------+------------+----------+---------+-----------+-------+  Summary:  Right: No  evidence of deep vein thrombosis in the upper extremity. No evidence of superficial vein thrombosis in the upper extremity.  Left: No evidence of superficial vein thrombosis in the upper extremity. Findings consistent with acute deep vein thrombosis involving the left internal jugular veins and left subclavian veins.  *See table(s) above for measurements and observations.  Diagnosing physician: Quay Burow MD Electronically signed by Quay Burow MD on 03/12/2018 at 4:53:40 PM.    Final    [place] Ekg Site Rite  Result Date: 03/14/2018 If Site Rite image not attached, placement could not be confirmed due to current cardiac rhythm.   ASSESSMENT & PLAN:   22 y.o. 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 Elevated LDH level consistent with a high-grade lymphoma-like process CBC not markedly abnormal at this time. No peripheral blood leukocytosis/lymphocytosis at this time. 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 #7 Fever - due to DVT vs post-obstructive pneumonia vs due to lymphoma/pleuritis   PLAN:  --labs reviewed -no prohoibitive toxicities -appropriate to proceed with C1D2 of EPOCH-R -neulasta as outpatient -will need outpatient PET/CT -continue allopurinol 100mg  po BID for TLS prophylaxis -tolerated lovenox - will need  teaching for self administration -will remove PICC line prior to discharge. -CXR in 2 days to evaluate residual pleural effusion and consider therapeutic thoracentesis again prior to discharge. -will continue  to follow for oncology  . The total time spent in the appointment was 35 minutes and more than 50% was on counseling and direct patient cares.      Sullivan Lone MD [place] AAHIVMS [facility] Hematology/Oncology Physician [facility]  (Office):       [phone] (Work cell):  [phone] (Fax):           [phone]

## 2018-03-17 ENCOUNTER — Other Ambulatory Visit: Payer: Self-pay | Admitting: Hematology

## 2018-03-17 ENCOUNTER — Telehealth: Payer: Self-pay | Admitting: Hematology

## 2018-03-17 DIAGNOSIS — C8528 Mediastinal (thymic) large B-cell lymphoma, lymph nodes of multiple sites: Secondary | ICD-10-CM

## 2018-03-17 LAB — CBC WITH DIFFERENTIAL/PLATELET
ABS IMMATURE GRANULOCYTES: 0.09 10*3/uL — AB (ref 0.00–0.07)
Basophils Absolute: 0 10*3/uL (ref 0.0–0.1)
Basophils Relative: 0 %
Eosinophils Absolute: 0 10*3/uL (ref 0.0–0.5)
Eosinophils Relative: 0 %
HCT: 35.7 % — ABNORMAL LOW (ref 39.0–52.0)
Hemoglobin: 11.2 g/dL — ABNORMAL LOW (ref 13.0–17.0)
Immature Granulocytes: 1 %
Lymphocytes Relative: 5 %
Lymphs Abs: 0.5 10*3/uL — ABNORMAL LOW (ref 0.7–4.0)
MCH: 25.8 pg — ABNORMAL LOW (ref 26.0–34.0)
MCHC: 31.4 g/dL (ref 30.0–36.0)
MCV: 82.3 fL (ref 80.0–100.0)
Monocytes Absolute: 0.6 10*3/uL (ref 0.1–1.0)
Monocytes Relative: 6 %
NEUTROS ABS: 9.9 10*3/uL — AB (ref 1.7–7.7)
Neutrophils Relative %: 88 %
Platelets: 505 10*3/uL — ABNORMAL HIGH (ref 150–400)
RBC: 4.34 MIL/uL (ref 4.22–5.81)
RDW: 13.9 % (ref 11.5–15.5)
WBC: 11.1 10*3/uL — ABNORMAL HIGH (ref 4.0–10.5)
nRBC: 0.2 % (ref 0.0–0.2)

## 2018-03-17 LAB — COMPREHENSIVE METABOLIC PANEL
ALBUMIN: 2.5 g/dL — AB (ref 3.5–5.0)
ALT: 63 U/L — ABNORMAL HIGH (ref 0–44)
AST: 31 U/L (ref 15–41)
Alkaline Phosphatase: 97 U/L (ref 38–126)
Anion gap: 7 (ref 5–15)
BUN: 17 mg/dL (ref 6–20)
CO2: 27 mmol/L (ref 22–32)
Calcium: 8.7 mg/dL — ABNORMAL LOW (ref 8.9–10.3)
Chloride: 106 mmol/L (ref 98–111)
Creatinine, Ser: 0.52 mg/dL — ABNORMAL LOW (ref 0.61–1.24)
GFR calc Af Amer: >60 mL/min (ref >60)
GFR calc non Af Amer: >60 mL/min (ref >60)
GLUCOSE: 136 mg/dL — AB (ref 70–99)
Potassium: 3.8 mmol/L (ref 3.5–5.1)
Sodium: 140 mmol/L (ref 135–145)
TOTAL PROTEIN: 5.9 g/dL — AB (ref 6.5–8.1)
Total Bilirubin: 0.4 mg/dL (ref 0.3–1.2)

## 2018-03-17 LAB — AFP TUMOR MARKER: AFP, Serum, Tumor Marker: 1.2 ng/mL (ref 0.0–8.3)

## 2018-03-17 LAB — URIC ACID: Uric Acid, Serum: 3.3 mg/dL — ABNORMAL LOW (ref 3.7–8.6)

## 2018-03-17 LAB — PHOSPHORUS: Phosphorus: 3.4 mg/dL (ref 2.5–4.6)

## 2018-03-17 LAB — MAGNESIUM: Magnesium: 2.3 mg/dL (ref 1.7–2.4)

## 2018-03-17 MED ORDER — SODIUM CHLORIDE 0.9 % IV SOLN
Freq: Once | INTRAVENOUS | Status: AC
  Administered 2018-03-17: 8 mg via INTRAVENOUS
  Filled 2018-03-17: qty 4

## 2018-03-17 MED ORDER — VINCRISTINE SULFATE CHEMO INJECTION 1 MG/ML
Freq: Once | INTRAVENOUS | Status: AC
  Administered 2018-03-17: 13:00:00 via INTRAVENOUS
  Filled 2018-03-17: qty 8

## 2018-03-17 NOTE — Telephone Encounter (Signed)
Scheduled appt per 12/9 sch message - pt vmail is full - unable to leave message.

## 2018-03-17 NOTE — Progress Notes (Signed)
HEMATOLOGY/ONCOLOGY INPATIENT PROGRESS NOTE  Date of Service: 03/17/2018  Inpatient Attending: .[name], MD   SUBJECTIVE:   Nicholas Moreno is accompanied today by his parents at bedside. The pt reports that he is doing well overall.  The pt notes that he is feeling constipated, and has had small bowel movements, but endorses eating very well. He has been taking Miralax every day as well.  The pt reports that he has not developed any new concerns today. He notes that his left arm swelling has improved.   The pt notes that he is breathing better and also denies any nausea, vomiting, or diarrhea.  Lab results today (03/17/18) of CBC w/diff and CMP is as follows: all values are WNL except for WBC at 11.1k, HGB at 11.2, HCT at 35.7, MCH at 25.8, PLT at 505k, ANC at 9.9k, Lymphs abs at 500, Abs Immature Granulocytes at 0.09k, Glucose at 136, Creatinine at 0.52, Calcium at 8.7, Total Protein at 5.9, Albumin at 2.5, ALT at 63. 03/17/18 Uric acid is at 3.3  On review of systems, pt reports improved left arm swelling, eating very well, small bowel movements, improved breathing, and denies fevers, nausea, vomiting, diarrhea, abdominal pains, leg swelling, and any other symptoms.    OBJECTIVE:  Mild discomfort from previous chest tube  PHYSICAL EXAMINATION: . Vitals:   03/16/18 1339 03/16/18 2027 03/17/18 0400 03/17/18 1413  BP: 93/63 97/66 102/65 100/62  Pulse: 80 64 63 71  Resp: 16 14 14 17   Temp: 97.8 F (36.6 C) 97.7 F (36.5 C) 97.7 F (36.5 C) 98.2 F (36.8 C)  TempSrc: Oral Oral Oral Oral  SpO2: 95% 100% 98% 97%  Weight:      Height:       Filed Weights   03/07/18 2000 03/08/18 0456 03/10/18 1344  Weight: 116 lb 10 oz (52.9 kg) 116 lb 10 oz (52.9 kg) 116 lb 10 oz (52.9 kg)   .Body mass index is 18.83 kg/m.  GENERAL:alert, in no acute distress and comfortable SKIN: no acute rashes, no significant lesions EYES: conjunctiva are pink and non-injected, sclera  anicteric OROPHARYNX: MMM, no exudates, no oropharyngeal erythema or ulceration NECK: supple, no JVD LYMPH:  no palpable lymphadenopathy in the cervical, axillary or inguinal regions LUNGS: decreased air entry left lung fields HEART: regular rate & rhythm ABDOMEN:  normoactive bowel sounds , non tender, not distended. No palpable hepatosplenomegaly.  Extremity: no pedal edema PSYCH: alert & oriented x 3 with fluent speech NEURO: no focal motor/sensory deficits    MEDICAL HISTORY:  History reviewed. No pertinent past medical history.  SURGICAL HISTORY: Past Surgical History:  Procedure Laterality Date  . MEDIASTINOSCOPY N/A 03/10/2018   Procedure: MEDIASTINOSCOPY;  Surgeon: Ivin Poot, MD;  Location: [place];  Service: Thoracic;  Laterality: N/A;  . VIDEO ASSISTED THORACOSCOPY (VATS)/ LYMPH NODE SAMPLING Left 03/10/2018   Procedure: VIDEO ASSISTED THORACOSCOPY (VATS)/ BIOSPY ANTERIOR APPROACH;  Surgeon: Ivin Poot, MD;  Location: [place];  Service: Thoracic;  Laterality: Left;  Marland Kitchen VIDEO BRONCHOSCOPY Left 03/10/2018   Procedure: VIDEO BRONCHOSCOPY;  Surgeon: Ivin Poot, MD;  Location: [facility] OR;  Service: Thoracic;  Laterality: Left;    SOCIAL HISTORY: Social History   Socioeconomic History  . Marital status: Single    Spouse name: Not on file  . Number of children: Not on file  . Years of education: Not on file  . Highest education level: Not on file  Occupational History  . Not on file  Social Needs  . Financial resource strain: Not on file  . Food insecurity:    Worry: Not on file    Inability: Not on file  . Transportation needs:    Medical: Not on file    Non-medical: Not on file  Tobacco Use  . Smoking status: Never Smoker  . Smokeless tobacco: Never Used  Substance and Sexual Activity  . Alcohol use: Not Currently  . Drug use: Not Currently  . Sexual activity: Not on file  Lifestyle  . Physical activity:    Days per week: Not on file    Minutes per  session: Not on file  . Stress: Not on file  Relationships  . Social connections:    Talks on phone: Not on file    Gets together: Not on file    Attends religious service: Not on file    Active member of club or organization: Not on file    Attends meetings of clubs or organizations: Not on file    Relationship status: Not on file  . Intimate partner violence:    Fear of current or ex partner: Not on file    Emotionally abused: Not on file    Physically abused: Not on file    Forced sexual activity: Not on file  Other Topics Concern  . Not on file  Social History Narrative  . Not on file    FAMILY HISTORY: History reviewed. No pertinent family history.  ALLERGIES:  has No Known Allergies.  MEDICATIONS:  Scheduled Meds: . allopurinol  100 mg Oral BID  . bisacodyl  10 mg Oral Daily  . dextromethorphan-guaiFENesin  1 tablet Oral BID  . 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 enoxaparin (LOVENOX) injection  1 mg/kg Subcutaneous Q12H  . feeding supplement (ENSURE ENLIVE)  237 mL Oral BID BM  . multivitamin with minerals  1 tablet Oral Daily  . pantoprazole  40 mg Oral Daily  . polyethylene glycol  17 g Oral Daily  . predniSONE  60 mg Oral QAC breakfast  . sodium chloride flush  10-40 mL Intracatheter Q12H   Continuous Infusions: . sodium chloride Stopped (03/15/18 1409)  . 0.9 % NaCl with KCl 20 mEq / L Stopped (03/15/18 1045)  . potassium chloride     PRN Meds:.alteplase, Cold Pack, fentaNYL (SUBLIMAZE) injection, heparin lock flush, heparin lock flush, Hot Pack, Influenza vac split quadrivalent PF, ondansetron (ZOFRAN) IV, potassium chloride, sodium chloride flush, sodium chloride flush, sodium chloride flush, traMADol  REVIEW OF SYSTEMS:    A 10+ POINT REVIEW OF SYSTEMS WAS OBTAINED including neurology, dermatology, psychiatry, cardiac, respiratory, lymph, extremities, GI, GU, Musculoskeletal, constitutional, breasts,  reproductive, HEENT.  All pertinent positives are noted in the HPI.  All others are negative.    LABORATORY DATA:  I have reviewed the data as listed  . CBC Latest Ref Rng & Units 03/17/2018 03/16/2018 03/15/2018  WBC 4.0 - 10.5 K/uL 11.1(H) 13.9(H) 12.7(H)  Hemoglobin 13.0 - 17.0 g/dL 11.2(L) 10.8(L) 12.1(L)  Hematocrit 39.0 - 52.0 % 35.7(L) 34.5(L) 38.8(L)  Platelets 150 - 400 K/uL 505(H) 435(H) 499(H)    . CMP Latest Ref Rng & Units 03/17/2018 03/16/2018 03/15/2018  Glucose 70 - 99 mg/dL 136(H) 122(H) 117(H)  BUN 6 - 20 mg/dL 17 14 10   Creatinine 0.61 - 1.24 mg/dL 0.52(L) 0.69 0.70  Sodium 135 - 145 mmol/L 140 137 138  Potassium 3.5 - 5.1 mmol/L 3.8 4.0 4.5  Chloride 98 -  111 mmol/L 106 102 105  CO2 22 - 32 mmol/L 27 26 25   Calcium 8.9 - 10.3 mg/dL 8.7(L) 8.6(L) 8.5(L)  Total Protein 6.5 - 8.1 g/dL 5.9(L) 5.9(L) 6.2(L)  Total Bilirubin 0.3 - 1.2 mg/dL 0.4 0.3 0.5  Alkaline Phos 38 - 126 U/L 97 99 101  AST 15 - 41 U/L 31 40 51(H)  ALT 0 - 44 U/L 63(H) 73(H) 82(H)   Component     Latest Ref Rng & Units 03/07/2018 03/08/2018  HIV Screen 4th Generation wRfx     Non Reactive Non Reactive   HCV Ab     0.0 - 0.9 s/co ratio  <0.1  Hepatitis B Surface Ag     Negative  Negative  Hep B Core Ab, Tot     Negative  Negative   Component     Latest Ref Rng & Units 03/16/2018  Magnesium     1.7 - 2.4 mg/dL 1.9  Uric Acid, Serum     3.7 - 8.6 mg/dL 3.2 (L)  Phosphorus     2.5 - 4.6 mg/dL 4.4  LDH     98 - 192 U/L 313 (H)    RADIOGRAPHIC STUDIES: I have personally reviewed the radiological images as listed and agreed with the findings in the report. Dg Chest 2 View  Result Date: 03/14/2018 CLINICAL DATA:  Shortness of breath EXAM: CHEST - 2 VIEW COMPARISON:  03/13/2018 FINDINGS: Right jugular central line is again seen. Left-sided apical pneumothorax is again noted and slightly improved when compared with the prior exam. Persistent mediastinal masses are noted stable from the prior  exam. Persistent small left pleural effusion is noted as well as left basilar consolidation. IMPRESSION: Slight decrease in left-sided hydropneumothorax. Persistent mediastinal masses with evidence of left basilar consolidation. Electronically Signed   By: Inez Catalina M.D.   On: 03/14/2018 07:16   Dg Chest 2 View  Result Date: 03/13/2018 CLINICAL DATA:  Follow-up LEFT hydropneumothorax after chest tube removal. EXAM: CHEST - 2 VIEW COMPARISON:  03/12/2018 dating back to 03/08/2018. CT chest 03/07/2018. FINDINGS: RIGHT jugular central venous catheter tip projects over the LOWER SVC, unchanged stable LEFT apicolateral pneumothorax since yesterday, on the order of 15% or so. Loculated LEFT pleural effusion with air-fluid levels, unchanged. Massive mediastinal mass with compression of the LEFT lung and compression of the MEDIAL RIGHT UPPER LOBE as noted on the prior CT. Slight improved aeration in the LEFT UPPER LOBE since yesterday. No new abnormalities. IMPRESSION: 1. Stable LEFT apicolateral pneumothorax and loculated LEFT hydropneumothorax after chest tube removal. 2. Slight improved aeration in the LEFT UPPER LOBE since yesterday. 3. Otherwise, no change in the massive mediastinal mass with compressive atelectasis involving much of the LEFT lung and the MEDIAL RIGHT UPPER LOBE. 4. No new abnormalities. Electronically Signed   By: Evangeline Dakin M.D.   On: 03/13/2018 09:17   Ct Angio Chest Pe W And/or Wo Contrast  Result Date: 03/07/2018 CLINICAL DATA:  Shortness of breath and cough. EXAM: CT ANGIOGRAPHY CHEST WITH CONTRAST TECHNIQUE: Multidetector CT imaging of the chest was performed using the standard protocol during bolus administration of intravenous contrast. Multiplanar CT image reconstructions and MIPs were obtained to evaluate the vascular anatomy. CONTRAST:  166mL ISOVUE-370 IOPAMIDOL (ISOVUE-370) INJECTION 76% COMPARISON:  None. FINDINGS: Cardiovascular: Normal heart size. The main pulmonary  artery appears patent. Filling defect within the left upper lobe lobar pulmonary artery, image 104 through image 101 compatible with acute pulmonary emboli. Mediastinum/Nodes: A very large anterior  mediastinal mass is identified which extends into both upper lung zones. This mass encases and narrows the superior vena cava as well as the branch vessels off the aortic arch. There is extensive chest wall collaterals especially in the left supraclavicular region and paraspinal region. Encasement and narrowing of the left main pulmonary artery and bilateral upper lobe pulmonary arteries. Encasement and narrowing of the trachea is identified with rightward and posterior displacement. The left mainstem bronchus is narrowed, and bilateral upper lobe bronchi are occluded. Narrowing of the left lower lobe bronchi. Lungs/Pleura: There is a moderate left pleural effusion. Approximately 90% opacification of the left upper lobe is identified secondary to tumor and postobstructive consolidation. A small amount of aerated lung within the apex. Compressive type atelectasis is noted within the left lung base. There is approximately 50% opacification of the right upper lobe with patchy areas of airspace consolidation and ground-glass attenuation within the remaining portions of the right upper lobe. The mass within the right upper lobe contains central areas of cavitation compatible with necrosis. Upper Abdomen: No acute findings. Musculoskeletal: No chest wall abnormality. No acute or significant osseous findings. Review of the MIP images confirms the above findings. IMPRESSION: 1. Small acute pulmonary emboli identified within the distal left upper lobe lobar pulmonary artery. 2. 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 Primary differential considerations include lymphoma and leukemia as well as malignant derm cell tumors. Marked narrowing of the superior  vena cava is noted and there is associated collateral vessel formation within the left supraclavicular region and paraspinal region. Narrowing and displacement of the trachea with complete occlusion of the upper lobe bronchi. There is also marked narrowing of bilateral upper lobe pulmonary arteries. 3. Significantly diminished aeration to both upper lobes, left greater than right. 4. Moderate left pleural effusion. Critical Value/emergent results were called by telephone at the time of interpretation on 03/07/2018 at 5:29 pm to Dr. Carlisle Cater , who verbally acknowledged these results. Electronically Signed   By: Kerby Moors M.D.   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 Status post mediastinoscopy and VATS. EXAM: CT ABDOMEN AND PELVIS WITH CONTRAST TECHNIQUE: Multidetector CT imaging of the abdomen and pelvis was performed using the standard protocol following bolus administration of intravenous contrast. CONTRAST:  174mL OMNIPAQUE IOHEXOL 300 MG/ML  SOLN COMPARISON:  Recent chest CT 03/07/2018 FINDINGS: Lower chest: Persistent left pleural effusion containing air from recent surgery. There is also air in the subcutaneous tissues of the anterior chest wall. Left lower lobe atelectasis. The heart is normal in size. The distal esophagus is grossly normal. Small right pleural effusion. The right lung base is grossly clear. Hepatobiliary: No focal hepatic lesions or intrahepatic biliary dilatation. The gallbladder is normal. No common bile duct dilatation. Pancreas: No mass, inflammation or ductal dilatation. Spleen: Normal size.  No focal lesions. Adrenals/Urinary Tract: Adrenal glands are unremarkable. Both kidneys demonstrate patchy peripheral perfusion defects typically seen with pyelonephritis. Recommend correlation with urinalysis. Stomach/Bowel: The stomach, duodenum, small bowel and colon are  grossly normal. No acute inflammatory changes, mass lesions or obstructive findings. Vascular/Lymphatic: No mesenteric or retroperitoneal mass or adenopathy. The aorta and branch vessels are normal. The major venous structures are patent. Reproductive: The prostate gland and seminal vesicles are unremarkable. 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 Musculoskeletal: No significant bony findings. No worrisome bone lesions. IMPRESSION: 1. Bilateral pleural effusions, left greater than right with left lower lobe atelectasis. There is also some pleural air and subcutaneous air on the left side consistent with recent surgery. 2. No lymphadenopathy in the abdomen or pelvis. 3. Patchy perfusion abnormalities involving both kidneys most typically seen with pyelonephritis. Recommend correlation with urinalysis. 4. Body wall edema, mesenteric edema and free pelvic fluid as above. Electronically Signed   By: Marijo Sanes M.D.   On: 03/13/2018 19:32   Dg Chest Port 1 View  Result Date: 03/12/2018 CLINICAL DATA:  22 year old male with mediastinal mass, status post VATS and biopsy with mediastinoscopy 05/12/2017, suspected lymphoma. EXAM: PORTABLE CHEST 1 VIEW COMPARISON:  Chest x-ray 03/11/2018, 03/10/2018, 03/08/2018, CT 03/07/2018 FINDINGS: Cardiomediastinal silhouette partially obscured by lung/pleural disease/mediastinal disease, unchanged. In the interval there has been development of left apical pneumothorax component. Opacity at the left base obscuring the left costophrenic angle in the left heart border. Unchanged left thoracostomy tube. Right IJ central venous catheter, unchanged. No right-sided confluent airspace disease. No pleural effusion or pneumothorax. IMPRESSION: Interval development of left apical pneumothorax, either ex vacuo, or representing air leak. Unchanged  position of thoracostomy tube. Unchanged right IJ central venous catheter. Similar appearance of cardiomediastinal silhouette, with obscuration of the heart borders and predominantly left lung by known mediastinal mass and left lung consolidation, status post VATS and mediastinoscopy. These results were called by telephone at the time of interpretation on 03/12/2018 at 10:06 am to nurse caring for patient, Ms Lilia Pro in [facility]. Electronically Signed   By: Corrie Mckusick D.O.   On: 03/12/2018 10:07   Dg Chest Port 1 View  Result Date: 03/11/2018 CLINICAL DATA:  Postop chest biopsy.  Chest tube. EXAM: PORTABLE CHEST 1 VIEW COMPARISON:  03/10/2018 FINDINGS: Near complete opacification of the left chest again noted due to large mediastinal mass and collapse of the left lung. Left pleural effusion is present. Left chest tube remains in place and unchanged. No pneumothorax. Mediastinal mass is present crossing the midline to the right unchanged. Remainder of the right lung is normally aerated Right jugular central venous catheter tip in the mid SVC unchanged. No pneumothorax on the right. IMPRESSION: Large mediastinal mass unchanged. Compression of the left lung with left effusion unchanged. Left chest tube in place No pneumothorax. Electronically Signed   By: Franchot Gallo M.D.   On: 03/11/2018 08:53   Dg Chest Port 1 View  Result Date: 03/10/2018 CLINICAL DATA:  Post left VATS.  Evaluate pneumothorax.  Chest tube. EXAM: PORTABLE CHEST 1 VIEW COMPARISON:  03/08/2018 FINDINGS: Interval placement of left chest tube. Continued large left pleural effusion and diffuse airspace disease throughout the left lung. No pneumothorax. Large rounded masslike opacity in the medial right upper lobe is stable. Right central line is in place with the tip in the SVC. No pneumothorax. IMPRESSION: Interval placement of left chest tube and right central line. No pneumothorax. Continued large left pleural effusion and near complete  opacification of the left lung. Continued medial right upper lobe mass. Electronically Signed   By: Rolm Baptise M.D.   On: 03/10/2018 20:53   Dg Chest Port 1 View  Result Date: 03/08/2018 CLINICAL DATA:  Status post left thoracentesis EXAM: PORTABLE CHEST 1 VIEW COMPARISON:  Chest CT from 1 day prior FINDINGS: Near complete opacification of the left hemithorax  with asymmetric volume loss in the left hemithorax, not substantially changed. Marked thickening of the upper mediastinum bilaterally, unchanged. Cardiac silhouette is obscured, with the heart appearing normal in size. No pneumothorax. No right pleural effusion. Persistent moderate left pleural effusion. Stable masslike opacity in the medial upper right lung with right upper lobe volume loss. IMPRESSION: 1. No pneumothorax.  Persistent moderate left pleural effusion. 2. Stable marked thickening of the upper mediastinum bilaterally compatible with known infiltrative anterior mediastinal mass. 3. Persistent volume loss in the left hemithorax and right upper lung. Stable near complete left lung opacification and masslike medial right upper lung opacity compatible with a combination of atelectasis and tumor. Electronically Signed   By: Ilona Sorrel M.D.   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 Comparison Study: No prior study on file for comparison Performing Technologist: Sharion Dove RVS  Examination Guidelines: A complete evaluation includes B-mode imaging, spectral Doppler, color Doppler, and power Doppler as needed of all accessible portions of each vessel. Bilateral testing is considered an integral part of a complete examination. Limited examinations for reoccurring indications may be performed as noted.  Right Venous Findings:  +---------+---------------+---------+-----------+----------+-------+          CompressibilityPhasicitySpontaneityPropertiesSummary +---------+---------------+---------+-----------+----------+-------+ CFV      Full           Yes      Yes                          +---------+---------------+---------+-----------+----------+-------+ SFJ      Full                                                 +---------+---------------+---------+-----------+----------+-------+ FV Prox  Full                                                 +---------+---------------+---------+-----------+----------+-------+ FV Mid   Full                                                 +---------+---------------+---------+-----------+----------+-------+ FV DistalFull                                                 +---------+---------------+---------+-----------+----------+-------+ PFV      Full                                                 +---------+---------------+---------+-----------+----------+-------+ POP      Full           Yes      Yes                          +---------+---------------+---------+-----------+----------+-------+  PTV      Full                                                 +---------+---------------+---------+-----------+----------+-------+ PERO     Full                                                 +---------+---------------+---------+-----------+----------+-------+  Left Venous Findings: +---------+---------------+---------+-----------+----------+-------+          CompressibilityPhasicitySpontaneityPropertiesSummary +---------+---------------+---------+-----------+----------+-------+ CFV      Full           Yes      Yes                          +---------+---------------+---------+-----------+----------+-------+ SFJ      Full                                                  +---------+---------------+---------+-----------+----------+-------+ FV Prox  Full                                                 +---------+---------------+---------+-----------+----------+-------+ FV Mid   Full                                                 +---------+---------------+---------+-----------+----------+-------+ FV DistalFull                                                 +---------+---------------+---------+-----------+----------+-------+ PFV      Full                                                 +---------+---------------+---------+-----------+----------+-------+ POP      Full           Yes      Yes                          +---------+---------------+---------+-----------+----------+-------+ PTV      Full                                                 +---------+---------------+---------+-----------+----------+-------+ PERO     Full                                                 +---------+---------------+---------+-----------+----------+-------+  Summary: Right: There is no evidence of deep vein thrombosis in the lower extremity. Sluggish flow noted throughout Left: There is no evidence of deep vein thrombosis in the lower extremity. Sluggish flow noted throughout  *See table(s) above for measurements and observations. Electronically signed by Servando Snare MD on 03/09/2018 at 8:52:35 PM.    Final    Vas [place] Upper Extremity Venous Duplex  Result Date: 03/12/2018 UPPER VENOUS STUDY  Indications: Edema, and SVC syndrome, chest mass Performing Technologist: Landry Mellow RDMS, RVT  Examination Guidelines: A complete evaluation includes B-mode imaging, spectral Doppler, color Doppler, and power Doppler as needed of all accessible portions of each vessel. Bilateral testing is considered an integral part of a complete examination. Limited examinations for reoccurring indications may be performed as noted.  Right Findings:  +----------+------------+----------+---------+-----------+---------------------+ RIGHT     CompressiblePropertiesPhasicitySpontaneous       Summary        +----------+------------+----------+---------+-----------+---------------------+ IJV                                                  unable to image due                                                           to IJ line       +----------+------------+----------+---------+-----------+---------------------+ Subclavian    Full                 Yes       Yes                          +----------+------------+----------+---------+-----------+---------------------+ Axillary      Full                 Yes       Yes                          +----------+------------+----------+---------+-----------+---------------------+ Brachial      Full                 Yes       Yes                          +----------+------------+----------+---------+-----------+---------------------+ Radial        Full                 Yes       Yes                          +----------+------------+----------+---------+-----------+---------------------+ Ulnar         Full                                                        +----------+------------+----------+---------+-----------+---------------------+ Cephalic      Full                                                        +----------+------------+----------+---------+-----------+---------------------+  Basilic       Full                                                        +----------+------------+----------+---------+-----------+---------------------+  Left Findings: +----------+------------+----------+---------+-----------+-------+ LEFT      CompressiblePropertiesPhasicitySpontaneousSummary +----------+------------+----------+---------+-----------+-------+ IJV         Partial                Yes       Yes     Acute   +----------+------------+----------+---------+-----------+-------+ Subclavian  Partial                Yes       Yes     Acute  +----------+------------+----------+---------+-----------+-------+ Axillary      Full                 Yes       Yes            +----------+------------+----------+---------+-----------+-------+ Brachial      Full                 Yes       Yes            +----------+------------+----------+---------+-----------+-------+ Radial        Full                                          +----------+------------+----------+---------+-----------+-------+ Ulnar         Full                                          +----------+------------+----------+---------+-----------+-------+ Cephalic      Full                                          +----------+------------+----------+---------+-----------+-------+ Basilic       Full                                          +----------+------------+----------+---------+-----------+-------+  Summary:  Right: No evidence of deep vein thrombosis in the upper extremity. No evidence of superficial vein thrombosis in the upper extremity.  Left: No evidence of superficial vein thrombosis in the upper extremity. Findings consistent with acute deep vein thrombosis involving the left internal jugular veins and left subclavian veins.  *See table(s) above for measurements and observations.  Diagnosing physician: Quay Burow MD Electronically signed by Quay Burow MD on 03/12/2018 at 4:53:40 PM.    Final    [place] Ekg Site Rite  Result Date: 03/14/2018 If Site Rite image not attached, placement could not be confirmed due to current cardiac rhythm.   ASSESSMENT & PLAN:   22 y.o. 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 Elevated LDH level consistent with a high-grade lymphoma-like  process CBC not markedly abnormal at this time. No peripheral blood leukocytosis/lymphocytosis  at this time. 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 #7 Fever - due to DVT vs post-obstructive pneumonia vs due to lymphoma/pleuritis   PLAN:  -Discussed pt labwork today, 03/17/18; HGB at 11.2, PLT at 505k, ANC at 9.9k, Uric acid at 3.3 -The pt has no prohibitive toxicities from continuing C1D3 EPOCH-R at this time.   -Neulasta as outpatient on Friday 13th -Continue eating well, staying hydrated  -CXR tomorrow morning on 03/18/18 for consideration of thoracentesis -Adding Senna S BID until bowel movements normalize -Will refer the pt to our social worker at the [facility] for cost of care considerations   -appropriate to proceed with C1D2 of EPOCH-R -will need outpatient PET/CT ASAP -continue allopurinol 100mg  po BID for TLS prophylaxis -tolerated lovenox - will need teaching for self administration -will remove PICC line prior to discharge. -CXR to evaluate residual pleural effusion and consider therapeutic thoracentesis again prior to discharge. -will continue to follow for oncology  The total time spent in the appt was 25 minutes and more than 50% was on counseling and direct patient cares.    Sullivan Lone MD [place] AAHIVMS [facility] Hematology/Oncology [facility]  (Office):       [phone] (Work cell):  [phone] (Fax):            [phone]  I, Baldwin Jamaica, am acting as a scribe for Dr. Sullivan Lone.   .I have reviewed the above documentation for accuracy and completeness, and I agree with the above. Sullivan Lone MD MS

## 2018-03-17 NOTE — Progress Notes (Signed)
Patient ID: Nicholas Moreno, male   DOB: 10-13-1995, 22 y.o.   MRN: [id number]  PROGRESS NOTE    Nicholas Moreno  QVZ:[id number] DOB: 01-22-1996 DOA: 03/07/2018 PCP: Patient, No Pcp Per   Brief Narrative:  22 year old Filipino male presented with shortness of breath and was found to have large mediastinal mass, left pleural effusion and left pulmonary embolism.  Started on heparin drip and underwent left-sided thoracentesis by PCCM.  Oncology was consulted.  Underwent VATS and mediastinal mass biopsy by CT surgery.  Status post chest tube placement and removal.  Biopsy confirmed high-grade B-cell lymphoma.  He was also found to have left IJ and subclavian vein DVT.  He was also started on antibiotics for fever.  He was transferred to [facility] to initiate chemotherapy as per oncology recommendations.   Assessment & Plan:   Principal Problem:   Lymphoma of intrathoracic lymph nodes (HCC) Active Problems:   Mediastinal mass   Single subsegmental pulmonary embolism without acute cor pulmonale   Superior vena cava syndrome   S/P thoracentesis   Malnutrition of moderate degree   Counseling regarding advance care planning and goals of care   Acute deep vein thrombosis (DVT) of left upper extremity (Nicholas Moreno)   Encounter for antineoplastic chemotherapy   High-grade B-cell lymphoma/mediastinal mass -He underwent VATS with biopsy of mediastinal mass and biopsies of the right lower lobe and left upper lobe masses: Biopsy showed high-grade B-cell lymphoma. -No lymphadenopathy in the abdomen or pelvis and CT of abdomen/pelvis -Status post chest tube placement and removal -Oncology following: Completed 3-day course of dexamethasone.   -Patient was started on chemotherapy on 03/15/2018 per oncology  Fever -Patient was started on cefepime and vancomycin on 03/14/2018 for fever the day prior.  He has been afebrile since then.  No evidence of any infection although CT of the abdomen showed  findings suggestive of pyelonephritis.  UA was negative and patient does not have urinary symptoms.   -Antibiotics were discontinued on 03/15/2018.  Monitor off antibiotics.  Currently afebrile.  Cultures negative so far  Left upper extremity DVT with single subsegmental pulmonary embolism -Patient has left IJ and subclavian DVT.  Initially started on heparin drip which was switched to Eliquis by oncology and subsequently switched to Lovenox by oncology.  Oncology is recommending Lovenox for now and transition to Eliquis in 2 months.  Superior vena cava compression from mass -Patient has completed course of dexamethasone  Leukocytosis -Improving.  Monitor  Left pleural effusion -Status post thoracentesis -Status post VATS and chest tube placement and removal -Chest x-ray from 03/14/2018 had shown slight decrease in left-sided hydropneumothorax and CT surgery was aware of this   DVT prophylaxis: Lovenox Code Status: Full Family Communication: None at bedside Disposition Plan: Depends on clinical outcome and oncology recommendations  Consultants: Oncology/CT surgery/PCCM  Procedures: Thoracentesis, VATS procedure, chest tube placement and removal  Antimicrobials:  Anti-infectives (From admission, onward)   Start     Dose/Rate Route Frequency Ordered Stop   03/14/18 2000  vancomycin (VANCOCIN) IVPB 750 mg/150 ml premix  Status:  Discontinued     750 mg 150 mL/hr over 60 Minutes Intravenous Every 12 hours 03/14/18 0755 03/15/18 1141   03/14/18 0800  ceFEPIme (MAXIPIME) 1 g in sodium chloride 0.9 % 100 mL IVPB  Status:  Discontinued     1 g 200 mL/hr over 30 Minutes Intravenous Every 8 hours 03/14/18 0755 03/15/18 1141   03/14/18 0800  vancomycin (VANCOCIN) 1,250 mg in sodium chloride 0.9 %  250 mL IVPB     1,250 mg 166.7 mL/hr over 90 Minutes Intravenous  Once 03/14/18 0755 03/14/18 1156   03/10/18 2330  ceFAZolin (ANCEF) IVPB 2g/100 mL premix     2 g 200 mL/hr over 30 Minutes  Intravenous Every 8 hours 03/10/18 2001 03/11/18 0739   03/09/18 1319  ceFAZolin (ANCEF) IVPB 2g/100 mL premix     2 g 200 mL/hr over 30 Minutes Intravenous 30 min pre-op 03/09/18 1319 03/10/18 1525        Subjective: Patient seen and examined at bedside.  No overnight fever, nausea or vomiting.  Tolerating chemotherapy well.  objective: Vitals:   03/16/18 0400 03/16/18 1339 03/16/18 2027 03/17/18 0400  BP: 106/64 93/63 97/66  102/65  Pulse: 79 80 64 63  Resp: 14 16 14 14   Temp: 98.4 F (36.9 C) 97.8 F (36.6 C) 97.7 F (36.5 C) 97.7 F (36.5 C)  TempSrc: Oral Oral Oral Oral  SpO2: 97% 95% 100% 98%  Weight:      Height:        Intake/Output Summary (Last 24 hours) at 03/17/2018 1038 Last data filed at 03/17/2018 0600 Gross per 24 hour  Intake 1451.67 ml  Output -  Net 1451.67 ml   Filed Weights   03/07/18 2000 03/08/18 0456 03/10/18 1344  Weight: 52.9 kg 52.9 kg 52.9 kg    Examination:  General exam: No acute distress.  47 built young male. Respiratory system: Bilateral decreased breath sounds at bases with some scattered crackles Cardiovascular system: Rate controlled, S1-S2 heard Gastrointestinal system: Abdomen is nondistended, soft and nontender. Normal bowel sounds heard. Extremities: No cyanosis, edema      Data Reviewed: I have personally reviewed following labs and imaging studies  CBC: Recent Labs  Lab 03/13/18 0923 03/14/18 0500 03/15/18 1639 03/16/18 0427 03/17/18 0401  WBC 11.4* 10.0 12.7* 13.9* 11.1*  NEUTROABS  --  8.6* 11.9* 12.5* 9.9*  HGB 12.1* 11.5* 12.1* 10.8* 11.2*  HCT 39.0 36.6* 38.8* 34.5* 35.7*  MCV 81.1 81.2 81.5 82.1 82.3  PLT 499* 435* 499* 435* 258*   Basic Metabolic Panel: Recent Labs  Lab 03/12/18 0530 03/14/18 0500 03/15/18 1639 03/16/18 0427 03/17/18 0401  NA 138 139 138 137 140  K 4.2 3.9 4.5 4.0 3.8  CL 103 102 105 102 106  CO2 28 27 25 26 27   GLUCOSE 108* 95 117* 122* 136*  BUN 17 9 10 14 17     CREATININE 0.91 0.82 0.70 0.69 0.52*  CALCIUM 8.6* 8.4* 8.5* 8.6* 8.7*  MG  --   --  2.2 1.9 2.3  PHOS  --   --  3.3 4.4 3.4   GFR: Estimated Creatinine Clearance: 109.3 mL/min (A) (by C-G formula based on SCr of 0.52 mg/dL (L)). Liver Function Tests: Recent Labs  Lab 03/12/18 0530 03/15/18 1639 03/16/18 0427 03/17/18 0401  AST 26 51* 40 31  ALT 21 82* 73* 63*  ALKPHOS 46 101 99 97  BILITOT 0.3 0.5 0.3 0.4  PROT 5.1* 6.2* 5.9* 5.9*  ALBUMIN 2.3* 2.6* 2.4* 2.5*   No results for input(s): LIPASE, AMYLASE in the last 168 hours. No results for input(s): AMMONIA in the last 168 hours. Coagulation Profile: No results for input(s): INR, PROTIME in the last 168 hours. Cardiac Enzymes: No results for input(s): CKTOTAL, CKMB, CKMBINDEX, TROPONINI in the last 168 hours. BNP (last 3 results) No results for input(s): PROBNP in the last 8760 hours. HbA1C: No results for input(s): HGBA1C in the last  72 hours. CBG: Recent Labs  Lab 03/10/18 2135 03/10/18 2322 03/11/18 0315 03/11/18 0512 03/11/18 1129  GLUCAP 118* 111* 114* 117* 122*   Lipid Profile: No results for input(s): CHOL, HDL, LDLCALC, TRIG, CHOLHDL, LDLDIRECT in the last 72 hours. Thyroid Function Tests: No results for input(s): TSH, T4TOTAL, FREET4, T3FREE, THYROIDAB in the last 72 hours. Anemia Panel: No results for input(s): VITAMINB12, FOLATE, FERRITIN, TIBC, IRON, RETICCTPCT in the last 72 hours. Sepsis Labs: No results for input(s): PROCALCITON, LATICACIDVEN in the last 168 hours.  Recent Results (from the past 240 hour(s))  MRSA PCR Screening     Status: None   Collection Time: 03/07/18  7:58 PM  Result Value Ref Range Status   MRSA by PCR NEGATIVE NEGATIVE Final    Comment:        The GeneXpert MRSA Assay (FDA approved for NASAL specimens only), is one component of a comprehensive MRSA colonization surveillance program. It is not intended to diagnose MRSA infection nor to guide or monitor treatment  for MRSA infections. Performed at [facility], [place] [address], [place], [place] [address]   Body fluid culture     Status: None   Collection Time: 03/08/18  3:24 PM  Result Value Ref Range Status   Specimen Description PLEURAL LEFT  Final   Special Requests NONE  Final   Gram Stain   Final    ABUNDANT WBC PRESENT, PREDOMINANTLY MONONUCLEAR NO ORGANISMS SEEN    Culture   Final    NO GROWTH 3 DAYS Performed at [facility], [place] [address], [place], [place] [address]    Report Status 03/11/2018 FINAL  Final  Surgical pcr screen     Status: Abnormal   Collection Time: 03/09/18  1:43 PM  Result Value Ref Range Status   MRSA, PCR NEGATIVE NEGATIVE Final   Staphylococcus aureus POSITIVE (A) NEGATIVE Final    Comment: (NOTE) The Xpert SA Assay (FDA approved for NASAL specimens in patients 33 years of age and older), is one component of a comprehensive surveillance program. It is not intended to diagnose infection nor to guide or monitor treatment.   Anaerobic culture     Status: None   Collection Time: 03/10/18  4:09 PM  Result Value Ref Range Status   Specimen Description BRONCHIAL WASHINGS LEFT  Final   Special Requests NONE  Final   Culture   Final    NO ANAEROBES ISOLATED Performed at [facility], [address], [place], [place] [address]    Report Status 03/15/2018 FINAL  Final  Culture, fungus without smear     Status: None (Preliminary result)   Collection Time: 03/10/18  4:09 PM  Result Value Ref Range Status   Specimen Description BRONCHIAL WASHINGS LEFT  Final   Special Requests NONE  Final   Culture   Final    NO FUNGUS ISOLATED AFTER 3 DAYS Performed at [facility], [place] [address], [place], [place] [address]    Report Status PENDING  Incomplete  Acid Fast Smear (AFB)     Status: None   Collection Time: 03/10/18  4:09 PM  Result Value Ref Range Status   AFB Specimen Processing Concentration  Final   Acid Fast Smear Negative  Final     Comment: (NOTE) Performed At: [facility] [place], [place] [id number] Rush Farmer MD ER:[id number]    Source (AFB) BRONCHIAL WASHINGS  Final    Comment: LEFT Performed at [facility], [place] [address] [place], [place] [address]   Culture, respiratory     Status: None   Collection Time: 03/10/18  4:09 PM  Result Value Ref Range Status   Specimen Description BRONCHIAL WASHINGS LEFT  Final   Special Requests NONE  Final   Gram Stain   Final    FEW WBC PRESENT, PREDOMINANTLY PMN NO ORGANISMS SEEN    Culture   Final    RARE Consistent with normal respiratory flora. Performed at [facility], [place] [address], [place], [place] [address]    Report Status 03/12/2018 FINAL  Final  Culture, blood (Routine X 2) w Reflex to ID Panel     Status: None (Preliminary result)   Collection Time: 03/14/18  7:36 AM  Result Value Ref Range Status   Specimen Description BLOOD LEFT HAND  Final   Special Requests   Final    BOTTLES DRAWN AEROBIC AND ANAEROBIC Blood Culture adequate volume   Culture   Final    NO GROWTH 2 DAYS Performed at [facility], [place] [address] [place], [place] [address]    Report Status PENDING  Incomplete  Culture, blood (Routine X 2) w Reflex to ID Panel     Status: None (Preliminary result)   Collection Time: 03/14/18  7:37 AM  Result Value Ref Range Status   Specimen Description BLOOD RIGHT ANTECUBITAL  Final   Special Requests   Final    BOTTLES DRAWN AEROBIC AND ANAEROBIC Blood Culture adequate volume   Culture   Final    NO GROWTH 2 DAYS Performed at [facility], [place] [address] [place], [place] [address]    Report Status PENDING  Incomplete  Urine Culture     Status: None   Collection Time: 03/14/18  2:58 PM  Result Value Ref Range Status   Specimen Description URINE, CLEAN CATCH  Final   Special Requests Immunocompromised  Final   Culture   Final    NO GROWTH Performed at [facility], [place]  [address], [place], [place] [address]    Report Status 03/15/2018 FINAL  Final         Radiology Studies: No results found.      Scheduled Meds: . allopurinol  100 mg Oral BID  . bisacodyl  10 mg Oral Daily  . dextromethorphan-guaiFENesin  1 tablet Oral BID  . 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 enoxaparin (LOVENOX) injection  1 mg/kg Subcutaneous Q12H  . feeding supplement (ENSURE ENLIVE)  237 mL Oral BID BM  . multivitamin with minerals  1 tablet Oral Daily  . pantoprazole  40 mg Oral Daily  . polyethylene glycol  17 g Oral Daily  . predniSONE  60 mg Oral QAC breakfast  . sodium chloride flush  10-40 mL Intracatheter Q12H   Continuous Infusions: . sodium chloride Stopped (03/15/18 1409)  . 0.9 % NaCl with KCl 20 mEq / L Stopped (03/15/18 1045)  . ondansetron (ZOFRAN) with dexamethasone (DECADRON) IV    . potassium chloride       LOS: 10 days        [name], MD [facility] Pager [phone]  If 7PM-7AM, please contact night-coverage [email or web address] Password [email or web address] 03/17/2018, 10:38 AM

## 2018-03-18 ENCOUNTER — Inpatient Hospital Stay (HOSPITAL_COMMUNITY): Payer: BLUE CROSS/BLUE SHIELD

## 2018-03-18 DIAGNOSIS — J910 Malignant pleural effusion: Secondary | ICD-10-CM

## 2018-03-18 LAB — CBC WITH DIFFERENTIAL/PLATELET
Abs Immature Granulocytes: 0.06 10*3/uL (ref 0.00–0.07)
Basophils Absolute: 0 10*3/uL (ref 0.0–0.1)
Basophils Relative: 0 %
EOS ABS: 0 10*3/uL (ref 0.0–0.5)
Eosinophils Relative: 0 %
HEMATOCRIT: 36.1 % — AB (ref 39.0–52.0)
Hemoglobin: 11.1 g/dL — ABNORMAL LOW (ref 13.0–17.0)
Immature Granulocytes: 1 %
Lymphocytes Relative: 5 %
Lymphs Abs: 0.4 10*3/uL — ABNORMAL LOW (ref 0.7–4.0)
MCH: 25.2 pg — ABNORMAL LOW (ref 26.0–34.0)
MCHC: 30.7 g/dL (ref 30.0–36.0)
MCV: 81.9 fL (ref 80.0–100.0)
Monocytes Absolute: 0.4 10*3/uL (ref 0.1–1.0)
Monocytes Relative: 4 %
NEUTROS PCT: 90 %
Neutro Abs: 7.6 10*3/uL (ref 1.7–7.7)
Platelets: 523 10*3/uL — ABNORMAL HIGH (ref 150–400)
RBC: 4.41 MIL/uL (ref 4.22–5.81)
RDW: 13.7 % (ref 11.5–15.5)
WBC: 8.5 10*3/uL (ref 4.0–10.5)
nRBC: 0 % (ref 0.0–0.2)

## 2018-03-18 LAB — COMPREHENSIVE METABOLIC PANEL
ALT: 56 U/L — ABNORMAL HIGH (ref 0–44)
AST: 27 U/L (ref 15–41)
Albumin: 2.4 g/dL — ABNORMAL LOW (ref 3.5–5.0)
Alkaline Phosphatase: 79 U/L (ref 38–126)
Anion gap: 12 (ref 5–15)
BILIRUBIN TOTAL: 0.4 mg/dL (ref 0.3–1.2)
BUN: 15 mg/dL (ref 6–20)
CO2: 25 mmol/L (ref 22–32)
Calcium: 8.6 mg/dL — ABNORMAL LOW (ref 8.9–10.3)
Chloride: 103 mmol/L (ref 98–111)
Creatinine, Ser: 0.54 mg/dL — ABNORMAL LOW (ref 0.61–1.24)
GFR calc Af Amer: >60 mL/min (ref >60)
Glucose, Bld: 98 mg/dL (ref 70–99)
Potassium: 3.7 mmol/L (ref 3.5–5.1)
Sodium: 140 mmol/L (ref 135–145)
Total Protein: 5.7 g/dL — ABNORMAL LOW (ref 6.5–8.1)

## 2018-03-18 LAB — PHOSPHORUS: Phosphorus: 3.4 mg/dL (ref 2.5–4.6)

## 2018-03-18 LAB — URIC ACID: URIC ACID, SERUM: 3.3 mg/dL — AB (ref 3.7–8.6)

## 2018-03-18 LAB — MAGNESIUM: Magnesium: 2.2 mg/dL (ref 1.7–2.4)

## 2018-03-18 MED ORDER — SODIUM CHLORIDE 0.9 % IV SOLN
Freq: Once | INTRAVENOUS | Status: AC
  Administered 2018-03-18: 18 mg via INTRAVENOUS
  Filled 2018-03-18: qty 4

## 2018-03-18 MED ORDER — VINCRISTINE SULFATE CHEMO INJECTION 1 MG/ML
Freq: Once | INTRAVENOUS | Status: AC
  Administered 2018-03-18: 12:00:00 via INTRAVENOUS
  Filled 2018-03-18: qty 8

## 2018-03-18 NOTE — Progress Notes (Signed)
HEMATOLOGY/ONCOLOGY INPATIENT PROGRESS NOTE  Date of Service: 03/18/2018  Inpatient Attending: .[name], MD   SUBJECTIVE:   Nicholas Moreno is accompanied today by his parents at bedside. The pt reports that he is doing well overall.   The pt reports that he has been continuing to breathe easier today. He denies any fevers, and denies coughing up phlegm.   He has been able to get out of bed and has ambulated more today. He notes that he has not felt overtly SOB when ambulating or when lying flat.   He notes that his left arm swelling has resolved.   Lab results today (03/18/18) of CBC w/diff and CMP is as follows: all values are WNL except for HGB at 11.1, HCT at 36.1, MCH at 25.2, PLT at 523k, Lymphs abs at 400, Creatinine at 0.54, Calcium at 8.6, Total Protein at 5.7, Albumin at 2.4, ALT at 56. 03/18/18 Uri acid at 3.3  On review of systems, pt reports breathing well, improved energy levels, improved left arm swelling, and denies fevers, phlegm, difficulty breathing while ambulating, and any other symptoms.   OBJECTIVE:  Mild discomfort from previous chest tube  PHYSICAL EXAMINATION: . Vitals:   03/17/18 1413 03/17/18 2139 03/18/18 0555 03/18/18 1235  BP: 100/62 97/75 101/61 103/62  Pulse: 71 70 (!) 52 83  Resp: 17 16 16 16   Temp: 98.2 F (36.8 C) 98.5 F (36.9 C) 97.8 F (36.6 C) 97.8 F (36.6 C)  TempSrc: Oral Axillary Oral Oral  SpO2: 97% 100% 99% 100%  Weight:      Height:       Filed Weights   03/07/18 2000 03/08/18 0456 03/10/18 1344  Weight: 116 lb 10 oz (52.9 kg) 116 lb 10 oz (52.9 kg) 116 lb 10 oz (52.9 kg)   .Body mass index is 18.83 kg/m.  GENERAL:alert, in no acute distress and comfortable SKIN: no acute rashes, no significant lesions EYES: conjunctiva are pink and non-injected, sclera anicteric OROPHARYNX: MMM, no exudates, no oropharyngeal erythema or ulceration NECK: supple, no JVD LYMPH:  no palpable lymphadenopathy in the cervical,  axillary or inguinal regions LUNGS: decreased air entry left lung fields  HEART: regular rate & rhythm ABDOMEN:  normoactive bowel sounds , non tender, not distended. No palpable hepatosplenomegaly.  Extremity: no pedal edema PSYCH: alert & oriented x 3 with fluent speech NEURO: no focal motor/sensory deficits     MEDICAL HISTORY:  History reviewed. No pertinent past medical history.  SURGICAL HISTORY: Past Surgical History:  Procedure Laterality Date  . MEDIASTINOSCOPY N/A 03/10/2018   Procedure: MEDIASTINOSCOPY;  Surgeon: Ivin Poot, MD;  Location: [facility];  Service: Thoracic;  Laterality: N/A;  . VIDEO ASSISTED THORACOSCOPY (VATS)/ LYMPH NODE SAMPLING Left 03/10/2018   Procedure: VIDEO ASSISTED THORACOSCOPY (VATS)/ BIOSPY ANTERIOR APPROACH;  Surgeon: Ivin Poot, MD;  Location: [facility];  Service: Thoracic;  Laterality: Left;  Marland Kitchen VIDEO BRONCHOSCOPY Left 03/10/2018   Procedure: VIDEO BRONCHOSCOPY;  Surgeon: Ivin Poot, MD;  Location: [facility] OR;  Service: Thoracic;  Laterality: Left;    SOCIAL HISTORY: Social History   Socioeconomic History  . Marital status: Single    Spouse name: Not on file  . Number of children: Not on file  . Years of education: Not on file  . Highest education level: Not on file  Occupational History  . Not on file  Social Needs  . Financial resource strain: Not on file  . Food insecurity:    Worry: Not on  file    Inability: Not on file  . Transportation needs:    Medical: Not on file    Non-medical: Not on file  Tobacco Use  . Smoking status: Never Smoker  . Smokeless tobacco: Never Used  Substance and Sexual Activity  . Alcohol use: Not Currently  . Drug use: Not Currently  . Sexual activity: Not on file  Lifestyle  . Physical activity:    Days per week: Not on file    Minutes per session: Not on file  . Stress: Not on file  Relationships  . Social connections:    Talks on phone: Not on file    Gets together: Not on file     Attends religious service: Not on file    Active member of club or organization: Not on file    Attends meetings of clubs or organizations: Not on file    Relationship status: Not on file  . Intimate partner violence:    Fear of current or ex partner: Not on file    Emotionally abused: Not on file    Physically abused: Not on file    Forced sexual activity: Not on file  Other Topics Concern  . Not on file  Social History Narrative  . Not on file    FAMILY HISTORY: History reviewed. No pertinent family history.  ALLERGIES:  has No Known Allergies.  MEDICATIONS:  Scheduled Meds: . allopurinol  100 mg Oral BID  . bisacodyl  10 mg Oral Daily  . dextromethorphan-guaiFENesin  1 tablet Oral BID  . 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 enoxaparin (LOVENOX) injection  1 mg/kg Subcutaneous Q12H  . feeding supplement (ENSURE ENLIVE)  237 mL Oral BID BM  . multivitamin with minerals  1 tablet Oral Daily  . pantoprazole  40 mg Oral Daily  . polyethylene glycol  17 g Oral Daily  . predniSONE  60 mg Oral QAC breakfast  . sodium chloride flush  10-40 mL Intracatheter Q12H   Continuous Infusions: . sodium chloride Stopped (03/15/18 1409)  . 0.9 % NaCl with KCl 20 mEq / L Stopped (03/15/18 1045)  . potassium chloride     PRN Meds:.alteplase, Cold Pack, fentaNYL (SUBLIMAZE) injection, heparin lock flush, heparin lock flush, Hot Pack, Influenza vac split quadrivalent PF, ondansetron (ZOFRAN) IV, potassium chloride, sodium chloride flush, sodium chloride flush, sodium chloride flush, traMADol  REVIEW OF SYSTEMS:    A 10+ POINT REVIEW OF SYSTEMS WAS OBTAINED including neurology, dermatology, psychiatry, cardiac, respiratory, lymph, extremities, GI, GU, Musculoskeletal, constitutional, breasts, reproductive, HEENT.  All pertinent positives are noted in the HPI.  All others are negative.   LABORATORY DATA:  I have reviewed the data as  listed  . CBC Latest Ref Rng & Units 03/19/2018 03/18/2018 03/17/2018  WBC 4.0 - 10.5 K/uL 8.1 8.5 11.1(H)  Hemoglobin 13.0 - 17.0 g/dL 11.0(L) 11.1(L) 11.2(L)  Hematocrit 39.0 - 52.0 % 34.7(L) 36.1(L) 35.7(L)  Platelets 150 - 400 K/uL 517(H) 523(H) 505(H)    . CMP Latest Ref Rng & Units 03/19/2018 03/18/2018 03/17/2018  Glucose 70 - 99 mg/dL 101(H) 98 136(H)  BUN 6 - 20 mg/dL 13 15 17   Creatinine 0.61 - 1.24 mg/dL 0.44(L) 0.54(L) 0.52(L)  Sodium 135 - 145 mmol/L 141 140 140  Potassium 3.5 - 5.1 mmol/L 3.6 3.7 3.8  Chloride 98 - 111 mmol/L 102 103 106  CO2 22 - 32 mmol/L 29 25 27   Calcium 8.9 - 10.3 mg/dL  9.0 8.6(L) 8.7(L)  Total Protein 6.5 - 8.1 g/dL 5.7(L) 5.7(L) 5.9(L)  Total Bilirubin 0.3 - 1.2 mg/dL 0.9 0.4 0.4  Alkaline Phos 38 - 126 U/L 73 79 97  AST 15 - 41 U/L 24 27 31   ALT 0 - 44 U/L 53(H) 56(H) 63(H)   Component     Latest Ref Rng & Units 03/07/2018 03/08/2018  HIV Screen 4th Generation wRfx     Non Reactive Non Reactive   HCV Ab     0.0 - 0.9 s/co ratio  <0.1  Hepatitis B Surface Ag     Negative  Negative  Hep B Core Ab, Tot     Negative  Negative   Component     Latest Ref Rng & Units 03/16/2018  Magnesium     1.7 - 2.4 mg/dL 1.9  Uric Acid, Serum     3.7 - 8.6 mg/dL 3.2 (L)  Phosphorus     2.5 - 4.6 mg/dL 4.4  LDH     98 - 192 U/L 313 (H)    RADIOGRAPHIC STUDIES: I have personally reviewed the radiological images as listed and agreed with the findings in the report. Dg Chest 2 View  Result Date: 03/18/2018 CLINICAL DATA:  22 year old who presented on 03/07/2018 with a large mediastinal mass and small LEFT upper lobe pulmonary emboli. Patient also has a small LEFT pleural effusion for which she underwent prior thoracentesis. 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 EXAM: CHEST - 2 VIEW COMPARISON:  03/14/2018 and earlier, including CTA chest 03/07/2018. FINDINGS: RIGHT arm PICC tip remains at or near the  cavoatrial junction. Large mediastinal mass as noted previously. Stable moderate-sized LEFT pleural effusion, a partially loculated hydropneumothorax as there is an air-fluid level in the LATERAL pleural space. Passive atelectasis in the LEFT lung, with slight improved aeration in the LEFT LOWER LOBE since the examination 4 days ago. Stable very small RIGHT pleural effusion. No new pulmonary parenchymal abnormalities. IMPRESSION: 1. Stable large mediastinal mass. 2. Stable moderate-sized LEFT pleural effusion, a partially loculated hydropneumothorax. 3. Improved aeration in the LEFT LOWER LOBE since the examination 4 days ago. 4. No new abnormalities. Electronically Signed   By: Evangeline Dakin M.D.   On: 03/18/2018 13:28   Dg Chest 2 View  Result Date: 03/14/2018 CLINICAL DATA:  Shortness of breath EXAM: CHEST - 2 VIEW COMPARISON:  03/13/2018 FINDINGS: Right jugular central line is again seen. Left-sided apical pneumothorax is again noted and slightly improved when compared with the prior exam. Persistent mediastinal masses are noted stable from the prior exam. Persistent small left pleural effusion is noted as well as left basilar consolidation. IMPRESSION: Slight decrease in left-sided hydropneumothorax. Persistent mediastinal masses with evidence of left basilar consolidation. Electronically Signed   By: Inez Catalina M.D.   On: 03/14/2018 07:16   Dg Chest 2 View  Result Date: 03/13/2018 CLINICAL DATA:  Follow-up LEFT hydropneumothorax after chest tube removal. EXAM: CHEST - 2 VIEW COMPARISON:  03/12/2018 dating back to 03/08/2018. CT chest 03/07/2018. FINDINGS: RIGHT jugular central venous catheter tip projects over the LOWER SVC, unchanged stable LEFT apicolateral pneumothorax since yesterday, on the order of 15% or so. Loculated LEFT pleural effusion with air-fluid levels, unchanged. Massive mediastinal mass with compression of the LEFT lung and compression of the MEDIAL RIGHT UPPER LOBE as noted on  the prior CT. Slight improved aeration in the LEFT UPPER LOBE since yesterday. No new abnormalities. IMPRESSION: 1. Stable LEFT apicolateral pneumothorax  and loculated LEFT hydropneumothorax after chest tube removal. 2. Slight improved aeration in the LEFT UPPER LOBE since yesterday. 3. Otherwise, no change in the massive mediastinal mass with compressive atelectasis involving much of the LEFT lung and the MEDIAL RIGHT UPPER LOBE. 4. No new abnormalities. Electronically Signed   By: Evangeline Dakin M.D.   On: 03/13/2018 09:17   Ct Angio Chest Pe W And/or Wo Contrast  Result Date: 03/07/2018 CLINICAL DATA:  Shortness of breath and cough. EXAM: CT ANGIOGRAPHY CHEST WITH CONTRAST TECHNIQUE: Multidetector CT imaging of the chest was performed using the standard protocol during bolus administration of intravenous contrast. Multiplanar CT image reconstructions and MIPs were obtained to evaluate the vascular anatomy. CONTRAST:  169mL ISOVUE-370 IOPAMIDOL (ISOVUE-370) INJECTION 76% COMPARISON:  None. FINDINGS: Cardiovascular: Normal heart size. The main pulmonary artery appears patent. Filling defect within the left upper lobe lobar pulmonary artery, image 104 through image 101 compatible with acute pulmonary emboli. Mediastinum/Nodes: A very large anterior mediastinal mass is identified which extends into both upper lung zones. This mass encases and narrows the superior vena cava as well as the branch vessels off the aortic arch. There is extensive chest wall collaterals especially in the left supraclavicular region and paraspinal region. Encasement and narrowing of the left main pulmonary artery and bilateral upper lobe pulmonary arteries. Encasement and narrowing of the trachea is identified with rightward and posterior displacement. The left mainstem bronchus is narrowed, and bilateral upper lobe bronchi are occluded. Narrowing of the left lower lobe bronchi. Lungs/Pleura: There is a moderate left pleural  effusion. Approximately 90% opacification of the left upper lobe is identified secondary to tumor and postobstructive consolidation. A small amount of aerated lung within the apex. Compressive type atelectasis is noted within the left lung base. There is approximately 50% opacification of the right upper lobe with patchy areas of airspace consolidation and ground-glass attenuation within the remaining portions of the right upper lobe. The mass within the right upper lobe contains central areas of cavitation compatible with necrosis. Upper Abdomen: No acute findings. Musculoskeletal: No chest wall abnormality. No acute or significant osseous findings. Review of the MIP images confirms the above findings. IMPRESSION: 1. Small acute pulmonary emboli identified within the distal left upper lobe lobar pulmonary artery. 2. 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 Primary differential considerations include lymphoma and leukemia as well as malignant derm cell tumors. 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 Narrowing and displacement of the trachea with complete occlusion of the upper lobe bronchi. There is also marked narrowing of bilateral upper lobe pulmonary arteries. 3. Significantly diminished aeration to both upper lobes, left greater than right. 4. Moderate left pleural effusion. Critical Value/emergent results were called by telephone at the time of interpretation on 03/07/2018 at 5:29 pm to Dr. Carlisle Cater , who verbally acknowledged these results. Electronically Signed   By: Kerby Moors M.D.   On: 03/07/2018 17:30   Dg Chest Bilateral Decubitus  Result Date: 03/18/2018 CLINICAL DATA:  Large mediastinal mass and moderate-sized LEFT pleural effusion (partially loculated hydropneumothorax) and small RIGHT  pleural effusion. EXAM: CHEST - BILATERAL DECUBITUS VIEW COMPARISON:  Chest x-rays earlier same day and previously. FINDINGS: Very small freely layering RIGHT pleural effusion. Partially loculated LEFT hydropneumothorax, though there is a freely layering component to the LEFT pleural effusion. IMPRESSION: 1. Partially  loculated LEFT hydropneumothorax though there is a freely layering component to the LEFT pleural effusion. 2. Freely layering very small RIGHT pleural effusion. Electronically Signed   By: Evangeline Dakin M.D.   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 Status post mediastinoscopy and VATS. EXAM: CT ABDOMEN AND PELVIS WITH CONTRAST TECHNIQUE: Multidetector CT imaging of the abdomen and pelvis was performed using the standard protocol following bolus administration of intravenous contrast. CONTRAST:  147mL OMNIPAQUE IOHEXOL 300 MG/ML  SOLN COMPARISON:  Recent chest CT 03/07/2018 FINDINGS: Lower chest: Persistent left pleural effusion containing air from recent surgery. There is also air in the subcutaneous tissues of the anterior chest wall. Left lower lobe atelectasis. The heart is normal in size. The distal esophagus is grossly normal. Small right pleural effusion. The right lung base is grossly clear. Hepatobiliary: No focal hepatic lesions or intrahepatic biliary dilatation. The gallbladder is normal. No common bile duct dilatation. Pancreas: No mass, inflammation or ductal dilatation. Spleen: Normal size.  No focal lesions. Adrenals/Urinary Tract: Adrenal glands are unremarkable. Both kidneys demonstrate patchy peripheral perfusion defects typically seen with pyelonephritis. Recommend correlation with urinalysis. Stomach/Bowel: The stomach, duodenum, small bowel and colon are grossly normal. No acute inflammatory changes, mass lesions or obstructive findings. Vascular/Lymphatic: No  mesenteric or retroperitoneal mass or adenopathy. The aorta and branch vessels are normal. The major venous structures are patent. Reproductive: The prostate gland and seminal vesicles are unremarkable. 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 Musculoskeletal: No significant bony findings. No worrisome bone lesions. IMPRESSION: 1. Bilateral pleural effusions, left greater than right with left lower lobe atelectasis. There is also some pleural air and subcutaneous air on the left side consistent with recent surgery. 2. No lymphadenopathy in the abdomen or pelvis. 3. Patchy perfusion abnormalities involving both kidneys most typically seen with pyelonephritis. Recommend correlation with urinalysis. 4. Body wall edema, mesenteric edema and free pelvic fluid as above. Electronically Signed   By: Marijo Sanes M.D.   On: 03/13/2018 19:32   Dg Chest Port 1 View  Result Date: 03/12/2018 CLINICAL DATA:  22 year old male with mediastinal mass, status post VATS and biopsy with mediastinoscopy 05/12/2017, suspected lymphoma. EXAM: PORTABLE CHEST 1 VIEW COMPARISON:  Chest x-ray 03/11/2018, 03/10/2018, 03/08/2018, CT 03/07/2018 FINDINGS: Cardiomediastinal silhouette partially obscured by lung/pleural disease/mediastinal disease, unchanged. In the interval there has been development of left apical pneumothorax component. Opacity at the left base obscuring the left costophrenic angle in the left heart border. Unchanged left thoracostomy tube. Right IJ central venous catheter, unchanged. No right-sided confluent airspace disease. No pleural effusion or pneumothorax. IMPRESSION: Interval development of left apical pneumothorax, either ex vacuo, or representing air leak. Unchanged position of thoracostomy tube. Unchanged right IJ central venous catheter. Similar appearance of  cardiomediastinal silhouette, with obscuration of the heart borders and predominantly left lung by known mediastinal mass and left lung consolidation, status post VATS and mediastinoscopy. These results were called by telephone at the time of interpretation on 03/12/2018 at 10:06 am to nurse caring for patient, Ms Lilia Pro in [facility]. Electronically Signed   By: Corrie Mckusick D.O.   On: 03/12/2018 10:07   Dg Chest Port 1 View  Result Date: 03/11/2018 CLINICAL DATA:  Postop chest biopsy.  Chest tube. EXAM: PORTABLE  CHEST 1 VIEW COMPARISON:  03/10/2018 FINDINGS: Near complete opacification of the left chest again noted due to large mediastinal mass and collapse of the left lung. Left pleural effusion is present. Left chest tube remains in place and unchanged. No pneumothorax. Mediastinal mass is present crossing the midline to the right unchanged. Remainder of the right lung is normally aerated Right jugular central venous catheter tip in the mid SVC unchanged. No pneumothorax on the right. IMPRESSION: Large mediastinal mass unchanged. Compression of the left lung with left effusion unchanged. Left chest tube in place No pneumothorax. Electronically Signed   By: Franchot Gallo M.D.   On: 03/11/2018 08:53   Dg Chest Port 1 View  Result Date: 03/10/2018 CLINICAL DATA:  Post left VATS.  Evaluate pneumothorax.  Chest tube. EXAM: PORTABLE CHEST 1 VIEW COMPARISON:  03/08/2018 FINDINGS: Interval placement of left chest tube. Continued large left pleural effusion and diffuse airspace disease throughout the left lung. No pneumothorax. Large rounded masslike opacity in the medial right upper lobe is stable. Right central line is in place with the tip in the SVC. No pneumothorax. IMPRESSION: Interval placement of left chest tube and right central line. No pneumothorax. Continued large left pleural effusion and near complete opacification of the left lung. Continued medial right upper lobe mass. Electronically Signed   By:  Rolm Baptise M.D.   On: 03/10/2018 20:53   Dg Chest Port 1 View  Result Date: 03/08/2018 CLINICAL DATA:  Status post left thoracentesis EXAM: PORTABLE CHEST 1 VIEW COMPARISON:  Chest CT from 1 day prior FINDINGS: Near complete opacification of the left hemithorax with asymmetric volume loss in the left hemithorax, not substantially changed. Marked thickening of the upper mediastinum bilaterally, unchanged. Cardiac silhouette is obscured, with the heart appearing normal in size. No pneumothorax. No right pleural effusion. Persistent moderate left pleural effusion. Stable masslike opacity in the medial upper right lung with right upper lobe volume loss. IMPRESSION: 1. No pneumothorax.  Persistent moderate left pleural effusion. 2. Stable marked thickening of the upper mediastinum bilaterally compatible with known infiltrative anterior mediastinal mass. 3. Persistent volume loss in the left hemithorax and right upper lung. Stable near complete left lung opacification and masslike medial right upper lung opacity compatible with a combination of atelectasis and tumor. Electronically Signed   By: Ilona Sorrel M.D.   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 Comparison Study: No prior study on file for comparison Performing Technologist: Sharion Dove RVS  Examination Guidelines: A complete evaluation includes B-mode imaging, spectral Doppler, color Doppler, and power Doppler as needed of all accessible portions of each vessel. Bilateral testing is considered an integral part of a complete examination. Limited examinations for reoccurring indications may be performed as noted.  Right Venous Findings: +---------+---------------+---------+-----------+----------+-------+           CompressibilityPhasicitySpontaneityPropertiesSummary +---------+---------------+---------+-----------+----------+-------+ CFV      Full           Yes      Yes                          +---------+---------------+---------+-----------+----------+-------+ SFJ      Full                                                 +---------+---------------+---------+-----------+----------+-------+  FV Prox  Full                                                 +---------+---------------+---------+-----------+----------+-------+ FV Mid   Full                                                 +---------+---------------+---------+-----------+----------+-------+ FV DistalFull                                                 +---------+---------------+---------+-----------+----------+-------+ PFV      Full                                                 +---------+---------------+---------+-----------+----------+-------+ POP      Full           Yes      Yes                          +---------+---------------+---------+-----------+----------+-------+ PTV      Full                                                 +---------+---------------+---------+-----------+----------+-------+ PERO     Full                                                 +---------+---------------+---------+-----------+----------+-------+  Left Venous Findings: +---------+---------------+---------+-----------+----------+-------+          CompressibilityPhasicitySpontaneityPropertiesSummary +---------+---------------+---------+-----------+----------+-------+ CFV      Full           Yes      Yes                          +---------+---------------+---------+-----------+----------+-------+ SFJ      Full                                                 +---------+---------------+---------+-----------+----------+-------+ FV Prox  Full                                                  +---------+---------------+---------+-----------+----------+-------+ FV Mid   Full                                                 +---------+---------------+---------+-----------+----------+-------+  FV DistalFull                                                 +---------+---------------+---------+-----------+----------+-------+ PFV      Full                                                 +---------+---------------+---------+-----------+----------+-------+ POP      Full           Yes      Yes                          +---------+---------------+---------+-----------+----------+-------+ PTV      Full                                                 +---------+---------------+---------+-----------+----------+-------+ PERO     Full                                                 +---------+---------------+---------+-----------+----------+-------+    Summary: Right: There is no evidence of deep vein thrombosis in the lower extremity. Sluggish flow noted throughout Left: There is no evidence of deep vein thrombosis in the lower extremity. Sluggish flow noted throughout  *See table(s) above for measurements and observations. Electronically signed by Servando Snare MD on 03/09/2018 at 8:52:35 PM.    Final    Vas [place] Upper Extremity Venous Duplex  Result Date: 03/12/2018 UPPER VENOUS STUDY  Indications: Edema, and SVC syndrome, chest mass Performing Technologist: Landry Mellow RDMS, RVT  Examination Guidelines: A complete evaluation includes B-mode imaging, spectral Doppler, color Doppler, and power Doppler as needed of all accessible portions of each vessel. Bilateral testing is considered an integral part of a complete examination. Limited examinations for reoccurring indications may be performed as noted.  Right Findings: +----------+------------+----------+---------+-----------+---------------------+ RIGHT     CompressiblePropertiesPhasicitySpontaneous       Summary         +----------+------------+----------+---------+-----------+---------------------+ IJV                                                  unable to image due                                                           to IJ line       +----------+------------+----------+---------+-----------+---------------------+ Subclavian    Full                 Yes       Yes                          +----------+------------+----------+---------+-----------+---------------------+  Axillary      Full                 Yes       Yes                          +----------+------------+----------+---------+-----------+---------------------+ Brachial      Full                 Yes       Yes                          +----------+------------+----------+---------+-----------+---------------------+ Radial        Full                 Yes       Yes                          +----------+------------+----------+---------+-----------+---------------------+ Ulnar         Full                                                        +----------+------------+----------+---------+-----------+---------------------+ Cephalic      Full                                                        +----------+------------+----------+---------+-----------+---------------------+ Basilic       Full                                                        +----------+------------+----------+---------+-----------+---------------------+  Left Findings: +----------+------------+----------+---------+-----------+-------+ LEFT      CompressiblePropertiesPhasicitySpontaneousSummary +----------+------------+----------+---------+-----------+-------+ IJV         Partial                Yes       Yes     Acute  +----------+------------+----------+---------+-----------+-------+ Subclavian  Partial                Yes       Yes     Acute  +----------+------------+----------+---------+-----------+-------+  Axillary      Full                 Yes       Yes            +----------+------------+----------+---------+-----------+-------+ Brachial      Full                 Yes       Yes            +----------+------------+----------+---------+-----------+-------+ Radial        Full                                          +----------+------------+----------+---------+-----------+-------+ Ulnar         Full                                          +----------+------------+----------+---------+-----------+-------+  Cephalic      Full                                          +----------+------------+----------+---------+-----------+-------+ Basilic       Full                                          +----------+------------+----------+---------+-----------+-------+  Summary:  Right: No evidence of deep vein thrombosis in the upper extremity. No evidence of superficial vein thrombosis in the upper extremity.  Left: No evidence of superficial vein thrombosis in the upper extremity. Findings consistent with acute deep vein thrombosis involving the left internal jugular veins and left subclavian veins.  *See table(s) above for measurements and observations.  Diagnosing physician: Quay Burow MD Electronically signed by Quay Burow MD on 03/12/2018 at 4:53:40 PM.    Final    [place] Ekg Site Rite  Result Date: 03/14/2018 If Site Rite image not attached, placement could not be confirmed due to current cardiac rhythm.   ASSESSMENT & PLAN:   22 y.o. 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 Elevated LDH level consistent with a high-grade lymphoma-like process CBC not markedly abnormal at this time. No peripheral blood leukocytosis/lymphocytosis at this time. 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 #7 Fever - due to DVT vs post-obstructive pneumonia vs due to lymphoma/pleuritis   PLAN:  -Discussed pt labwork today, 03/18/18; blood counts and chemistries are stable. Uric acid at 3.3.  -The pt has no prohibitive toxicities from continuing C1D4 EPOCH-R at this time.   -Continue eating well, staying hydrated, and staying as active as reasonably possible  -Discussed the 03/18/18 CXR which revealed Stable large mediastinal mass. 2. Stable moderate-sized LEFT pleural effusion, a partially loculated hydropneumothorax. 3. Improved aeration in the LEFT LOWER LOBE since the examination 4 days ago. 4. No new abnormalities.  -Pt is symptomatically stable and is not having overt SOB, will watch at this time, which pt is agreeable to -Discussed crowd avoidance strategies when the pt is discharged, and strictly avoiding individuals with infections as this can delay treatment   -Neulasta/Udenyca as outpatient on Friday 13th -Adding Senna S BID until bowel movements normalize -Will refer the pt to our social worker at the [facility] for cost of care considerations   -will need outpatient PET/CT ASAP -continue allopurinol 100mg  po BID for TLS prophylaxis -tolerated lovenox - undergoing teaching for self administration -will remove PICC line prior to discharge. -will continue to follow for oncology Likely discharge tomorrow 03/20/2018 after completion of Cytoxan and Rituxan  The total time spent in the appt was 25 minutes  and more than 50% was on counseling and direct patient cares.    Gautam  Irene Limbo MD [place] AAHIVMS [facility] Hematology/Oncology Physician [facility]  (Office):       [phone] (Work cell):  [phone] (Fax):           [phone]  I, Baldwin Jamaica, am acting as a scribe for Dr. Sullivan Lone.   .I have reviewed the above documentation for accuracy and completeness, and I agree with the above. Sullivan Lone MD MS

## 2018-03-18 NOTE — Progress Notes (Signed)
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  Ongoing.  GOAL:   Patient will meet greater than or equal to 90% of their needs  Progressing.  MONITOR:   PO intake, Supplement acceptance, Skin, Labs, Weight trends, I & O's  ASSESSMENT:   22 year old male who presented to the ED on 11/29 with SOB and weak cough x 2 months. CT showed a large, necrotic mediastinal mass with adjacent atelectasis, left pleural effusion, and small RUL PA. Pt admitted for dyspnea in setting of large mediastinal mass and PE. No significant PMH.  11/30 - s/p thoracentesis with removal of 1000 ml of fluid 12/2 - s/p video bronchoscopy, mediastinoscopy, VATS, biopsies, chest tube  Patient now eating well, consuming 100% of meals. Drinking Ensure supplements less consistently.  Per weight records, no new weight measured since 12/2.   Medications: Miralax packet daily, IV Zofran/Decadron once Labs reviewed: Mg/Phos WNL Diet Order:   Diet Order            Diet regular Room service appropriate? Yes; Fluid consistency: Thin  Diet effective now              EDUCATION NEEDS:   Education needs have been addressed  Skin:  Skin Assessment: Skin Integrity Issues: Skin Integrity Issues:: Incisions Incisions: neck, left chest  Last BM:  12/9  Height:   Ht Readings from Last 1 Encounters:  03/10/18 5' 5.98" (1.676 m)    Weight:   Wt Readings from Last 1 Encounters:  03/10/18 52.9 kg    Ideal Body Weight:  64.5 kg  BMI:   Body mass index is 18.83 kg/m.  Estimated Nutritional Needs:   Kcal:  5916-3846  Protein:  75-90 grams  Fluid:  >/= 1.9 L  Clayton Bibles, MS, RD, LDN [place] [facility] Pager: [phone] After Hours Pager: [phone]

## 2018-03-18 NOTE — Progress Notes (Signed)
Patient ID: Nicholas Moreno, male   DOB: 1995-12-20, 22 y.o.   MRN: [id number]  PROGRESS NOTE    Olen Eaves  MKL:[id number] DOB: March 28, 1996 DOA: 03/07/2018 PCP: Patient, No Pcp Per   Brief Narrative:  22 year old Filipino male presented with shortness of breath and was found to have large mediastinal mass, left pleural effusion and left pulmonary embolism.  Started on heparin drip and underwent left-sided thoracentesis by PCCM.  Oncology was consulted.  Underwent VATS and mediastinal mass biopsy by CT surgery.  Status post chest tube placement and removal.  Biopsy confirmed high-grade B-cell lymphoma.  He was also found to have left IJ and subclavian vein DVT.  He was also started on antibiotics for fever.  He was transferred to [facility] to initiate chemotherapy as per oncology recommendations.   Assessment & Plan:   Principal Problem:   Lymphoma of intrathoracic lymph nodes (HCC) Active Problems:   Mediastinal mass   Single subsegmental pulmonary embolism without acute cor pulmonale   Superior vena cava syndrome   S/P thoracentesis   Malnutrition of moderate degree   Counseling regarding advance care planning and goals of care   Acute deep vein thrombosis (DVT) of left upper extremity (HCC)   Encounter for antineoplastic chemotherapy   Pleural effusion   High-grade B-cell lymphoma/mediastinal mass -He underwent VATS with biopsy of mediastinal mass and biopsies of the right lower lobe and left upper lobe masses: Biopsy showed high-grade B-cell lymphoma. -No lymphadenopathy in the abdomen or pelvis and CT of abdomen/pelvis -Status post chest tube placement and removal -Oncology following: Completed 3-day course of dexamethasone.   -Patient was started on chemotherapy on 03/15/2018 per oncology.  Tolerating chemotherapy.  Oncology following.  Fever -Patient was started on cefepime and vancomycin on 03/14/2018 for fever the day prior.  He has been afebrile since then.   No evidence of any infection although CT of the abdomen showed findings suggestive of pyelonephritis.  UA was negative and patient does not have urinary symptoms.   -Antibiotics were discontinued on 03/15/2018.  Monitor off antibiotics.  Currently afebrile.  Cultures negative so far  Left upper extremity DVT with single subsegmental pulmonary embolism -Patient has left IJ and subclavian DVT.  Initially started on heparin drip which was switched to Eliquis by oncology and subsequently switched to Lovenox by oncology.  Oncology is recommending Lovenox for now and transition to Eliquis in 2 months.  Superior vena cava compression from mass -Patient has completed course of dexamethasone  Leukocytosis -Resolved.  Left pleural effusion -Status post thoracentesis -Status post VATS and chest tube placement and removal -Chest x-ray from 03/14/2018 had shown slight decrease in left-sided hydropneumothorax and CT surgery was aware of this   DVT prophylaxis: Lovenox Code Status: Full Family Communication: None at bedside Disposition Plan: Probable discharge home upon completion of chemotherapy on 03/20/2018  Consultants: Oncology/CT surgery/PCCM  Procedures: Thoracentesis, VATS procedure, chest tube placement and removal  Antimicrobials:  Anti-infectives (From admission, onward)   Start     Dose/Rate Route Frequency Ordered Stop   03/14/18 2000  vancomycin (VANCOCIN) IVPB 750 mg/150 ml premix  Status:  Discontinued     750 mg 150 mL/hr over 60 Minutes Intravenous Every 12 hours 03/14/18 0755 03/15/18 1141   03/14/18 0800  ceFEPIme (MAXIPIME) 1 g in sodium chloride 0.9 % 100 mL IVPB  Status:  Discontinued     1 g 200 mL/hr over 30 Minutes Intravenous Every 8 hours 03/14/18 0755 03/15/18 1141   03/14/18 0800  vancomycin (VANCOCIN) 1,250 mg in sodium chloride 0.9 % 250 mL IVPB     1,250 mg 166.7 mL/hr over 90 Minutes Intravenous  Once 03/14/18 0755 03/14/18 1156   03/10/18 2330  ceFAZolin  (ANCEF) IVPB 2g/100 mL premix     2 g 200 mL/hr over 30 Minutes Intravenous Every 8 hours 03/10/18 2001 03/11/18 0739   03/09/18 1319  ceFAZolin (ANCEF) IVPB 2g/100 mL premix     2 g 200 mL/hr over 30 Minutes Intravenous 30 min pre-op 03/09/18 1319 03/10/18 1525         Subjective: Patient seen and examined at bedside.  Denies any worsening shortness of breath, fever, nausea or vomiting  objective: Vitals:   03/17/18 0400 03/17/18 1413 03/17/18 2139 03/18/18 0555  BP: 102/65 100/62 97/75 101/61  Pulse: 63 71 70 (!) 52  Resp: 14 17 16 16   Temp: 97.7 F (36.5 C) 98.2 F (36.8 C) 98.5 F (36.9 C) 97.8 F (36.6 C)  TempSrc: Oral Oral Axillary Oral  SpO2: 98% 97% 100% 99%  Weight:      Height:        Intake/Output Summary (Last 24 hours) at 03/18/2018 1138 Last data filed at 03/18/2018 0900 Gross per 24 hour  Intake 1061 ml  Output -  Net 1061 ml   Filed Weights   03/07/18 2000 03/08/18 0456 03/10/18 1344  Weight: 52.9 kg 52.9 kg 52.9 kg    Examination:  General exam: No distress.  71 built young male. Respiratory system: Bilateral decreased breath sounds at bases, no wheezing cardiovascular system: S1-S2 heard, rate controlled Gastrointestinal system: Abdomen is nondistended, soft and nontender. Normal bowel sounds heard. Extremities: No cyanosis, edema      Data Reviewed: I have personally reviewed following labs and imaging studies  CBC: Recent Labs  Lab 03/14/18 0500 03/15/18 1639 03/16/18 0427 03/17/18 0401 03/18/18 0503  WBC 10.0 12.7* 13.9* 11.1* 8.5  NEUTROABS 8.6* 11.9* 12.5* 9.9* 7.6  HGB 11.5* 12.1* 10.8* 11.2* 11.1*  HCT 36.6* 38.8* 34.5* 35.7* 36.1*  MCV 81.2 81.5 82.1 82.3 81.9  PLT 435* 499* 435* 505* 342*   Basic Metabolic Panel: Recent Labs  Lab 03/14/18 0500 03/15/18 1639 03/16/18 0427 03/17/18 0401 03/18/18 0503  NA 139 138 137 140 140  K 3.9 4.5 4.0 3.8 3.7  CL 102 105 102 106 103  CO2 27 25 26 27 25   GLUCOSE 95 117*  122* 136* 98  BUN 9 10 14 17 15   CREATININE 0.82 0.70 0.69 0.52* 0.54*  CALCIUM 8.4* 8.5* 8.6* 8.7* 8.6*  MG  --  2.2 1.9 2.3 2.2  PHOS  --  3.3 4.4 3.4 3.4   GFR: Estimated Creatinine Clearance: 109.3 mL/min (A) (by C-G formula based on SCr of 0.54 mg/dL (L)). Liver Function Tests: Recent Labs  Lab 03/12/18 0530 03/15/18 1639 03/16/18 0427 03/17/18 0401 03/18/18 0503  AST 26 51* 40 31 27  ALT 21 82* 73* 63* 56*  ALKPHOS 46 101 99 97 79  BILITOT 0.3 0.5 0.3 0.4 0.4  PROT 5.1* 6.2* 5.9* 5.9* 5.7*  ALBUMIN 2.3* 2.6* 2.4* 2.5* 2.4*   No results for input(s): LIPASE, AMYLASE in the last 168 hours. No results for input(s): AMMONIA in the last 168 hours. Coagulation Profile: No results for input(s): INR, PROTIME in the last 168 hours. Cardiac Enzymes: No results for input(s): CKTOTAL, CKMB, CKMBINDEX, TROPONINI in the last 168 hours. BNP (last 3 results) No results for input(s): PROBNP in the last 8760 hours.  HbA1C: No results for input(s): HGBA1C in the last 72 hours. CBG: No results for input(s): GLUCAP in the last 168 hours. Lipid Profile: No results for input(s): CHOL, HDL, LDLCALC, TRIG, CHOLHDL, LDLDIRECT in the last 72 hours. Thyroid Function Tests: No results for input(s): TSH, T4TOTAL, FREET4, T3FREE, THYROIDAB in the last 72 hours. Anemia Panel: No results for input(s): VITAMINB12, FOLATE, FERRITIN, TIBC, IRON, RETICCTPCT in the last 72 hours. Sepsis Labs: No results for input(s): PROCALCITON, LATICACIDVEN in the last 168 hours.  Recent Results (from the past 240 hour(s))  Body fluid culture     Status: None   Collection Time: 03/08/18  3:24 PM  Result Value Ref Range Status   Specimen Description PLEURAL LEFT  Final   Special Requests NONE  Final   Gram Stain   Final    ABUNDANT WBC PRESENT, PREDOMINANTLY MONONUCLEAR NO ORGANISMS SEEN    Culture   Final    NO GROWTH 3 DAYS Performed at [facility], [address], [place], [place] [address]     Report Status 03/11/2018 FINAL  Final  Surgical pcr screen     Status: Abnormal   Collection Time: 03/09/18  1:43 PM  Result Value Ref Range Status   MRSA, PCR NEGATIVE NEGATIVE Final   Staphylococcus aureus POSITIVE (A) NEGATIVE Final    Comment: (NOTE) The Xpert SA Assay (FDA approved for NASAL specimens in patients 62 years of age and older), is one component of a comprehensive surveillance program. It is not intended to diagnose infection nor to guide or monitor treatment.   Anaerobic culture     Status: None   Collection Time: 03/10/18  4:09 PM  Result Value Ref Range Status   Specimen Description BRONCHIAL WASHINGS LEFT  Final   Special Requests NONE  Final   Culture   Final    NO ANAEROBES ISOLATED Performed at [facility], [address], [place], [place] [address]    Report Status 03/15/2018 FINAL  Final  Culture, fungus without smear     Status: None (Preliminary result)   Collection Time: 03/10/18  4:09 PM  Result Value Ref Range Status   Specimen Description BRONCHIAL WASHINGS LEFT  Final   Special Requests NONE  Final   Culture   Final    NO FUNGUS ISOLATED AFTER 7 DAYS Performed at [facility], [place] [address], [place], [place] [address]    Report Status PENDING  Incomplete  Acid Fast Smear (AFB)     Status: None   Collection Time: 03/10/18  4:09 PM  Result Value Ref Range Status   AFB Specimen Processing Concentration  Final   Acid Fast Smear Negative  Final    Comment: (NOTE) Performed At: [facility] [place], [place] [id number] Rush Farmer MD [id number]    Source (AFB) BRONCHIAL WASHINGS  Final    Comment: LEFT Performed at [facility], [place] [address] [place], [place] [address]   Culture, respiratory     Status: None   Collection Time: 03/10/18  4:09 PM  Result Value Ref Range Status   Specimen Description BRONCHIAL WASHINGS LEFT  Final   Special Requests NONE  Final   Gram Stain   Final    FEW WBC  PRESENT, PREDOMINANTLY PMN NO ORGANISMS SEEN    Culture   Final    RARE Consistent with normal respiratory flora. Performed at [facility], [place] [address] [place], [place] [address]  Report Status 03/12/2018 FINAL  Final  Culture, blood (Routine X 2) w Reflex to ID Panel     Status: None (Preliminary result)   Collection Time: 03/14/18  7:36 AM  Result Value Ref Range Status   Specimen Description BLOOD LEFT HAND  Final   Special Requests   Final    BOTTLES DRAWN AEROBIC AND ANAEROBIC Blood Culture adequate volume   Culture   Final    NO GROWTH 3 DAYS Performed at [facility], [place] [address], [place], [place] [address]    Report Status PENDING  Incomplete  Culture, blood (Routine X 2) w Reflex to ID Panel     Status: None (Preliminary result)   Collection Time: 03/14/18  7:37 AM  Result Value Ref Range Status   Specimen Description BLOOD RIGHT ANTECUBITAL  Final   Special Requests   Final    BOTTLES DRAWN AEROBIC AND ANAEROBIC Blood Culture adequate volume   Culture   Final    NO GROWTH 3 DAYS Performed at [facility], [place] [address] [place], [place] [address]    Report Status PENDING  Incomplete  Urine Culture     Status: None   Collection Time: 03/14/18  2:58 PM  Result Value Ref Range Status   Specimen Description URINE, CLEAN CATCH  Final   Special Requests Immunocompromised  Final   Culture   Final    NO GROWTH Performed at [facility], [place] [address], [place], [place] [address]    Report Status 03/15/2018 FINAL  Final         Radiology Studies: No results found.      Scheduled Meds: . allopurinol  100 mg Oral BID  . bisacodyl  10 mg Oral Daily  . dextromethorphan-guaiFENesin  1 tablet Oral BID  . 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 enoxaparin (LOVENOX) injection  1 mg/kg Subcutaneous Q12H  . feeding supplement (ENSURE ENLIVE)  237 mL Oral BID BM  . multivitamin with  minerals  1 tablet Oral Daily  . pantoprazole  40 mg Oral Daily  . polyethylene glycol  17 g Oral Daily  . predniSONE  60 mg Oral QAC breakfast  . sodium chloride flush  10-40 mL Intracatheter Q12H   Continuous Infusions: . sodium chloride Stopped (03/15/18 1409)  . 0.9 % NaCl with KCl 20 mEq / L Stopped (03/15/18 1045)  . ondansetron (ZOFRAN) with dexamethasone (DECADRON) IV 18 mg (03/18/18 1111)  . potassium chloride       LOS: 11 days        [name], MD [facility] Pager [phone]  If 7PM-7AM, please contact night-coverage [email or web address] Password [email or web address] 03/18/2018, 11:38 AM

## 2018-03-19 ENCOUNTER — Encounter: Payer: Self-pay | Admitting: *Deleted

## 2018-03-19 DIAGNOSIS — Z79899 Other long term (current) drug therapy: Secondary | ICD-10-CM

## 2018-03-19 LAB — CBC WITH DIFFERENTIAL/PLATELET
Abs Immature Granulocytes: 0.06 10*3/uL (ref 0.00–0.07)
BASOS PCT: 0 %
Basophils Absolute: 0 10*3/uL (ref 0.0–0.1)
Eosinophils Absolute: 0 10*3/uL (ref 0.0–0.5)
Eosinophils Relative: 0 %
HCT: 34.7 % — ABNORMAL LOW (ref 39.0–52.0)
Hemoglobin: 11 g/dL — ABNORMAL LOW (ref 13.0–17.0)
Immature Granulocytes: 1 %
Lymphocytes Relative: 6 %
Lymphs Abs: 0.5 10*3/uL — ABNORMAL LOW (ref 0.7–4.0)
MCH: 25.5 pg — AB (ref 26.0–34.0)
MCHC: 31.7 g/dL (ref 30.0–36.0)
MCV: 80.3 fL (ref 80.0–100.0)
Monocytes Absolute: 0.1 10*3/uL (ref 0.1–1.0)
Monocytes Relative: 2 %
Neutro Abs: 7.5 10*3/uL (ref 1.7–7.7)
Neutrophils Relative %: 91 %
Platelets: 517 10*3/uL — ABNORMAL HIGH (ref 150–400)
RBC: 4.32 MIL/uL (ref 4.22–5.81)
RDW: 13.2 % (ref 11.5–15.5)
WBC: 8.1 10*3/uL (ref 4.0–10.5)
nRBC: 0 % (ref 0.0–0.2)

## 2018-03-19 LAB — COMPREHENSIVE METABOLIC PANEL
ALK PHOS: 73 U/L (ref 38–126)
ALT: 53 U/L — ABNORMAL HIGH (ref 0–44)
AST: 24 U/L (ref 15–41)
Albumin: 2.7 g/dL — ABNORMAL LOW (ref 3.5–5.0)
Anion gap: 10 (ref 5–15)
BUN: 13 mg/dL (ref 6–20)
CO2: 29 mmol/L (ref 22–32)
Calcium: 9 mg/dL (ref 8.9–10.3)
Chloride: 102 mmol/L (ref 98–111)
Creatinine, Ser: 0.44 mg/dL — ABNORMAL LOW (ref 0.61–1.24)
GFR calc Af Amer: >60 mL/min (ref >60)
GFR calc non Af Amer: >60 mL/min (ref >60)
GLUCOSE: 101 mg/dL — AB (ref 70–99)
Potassium: 3.6 mmol/L (ref 3.5–5.1)
Sodium: 141 mmol/L (ref 135–145)
Total Bilirubin: 0.9 mg/dL (ref 0.3–1.2)
Total Protein: 5.7 g/dL — ABNORMAL LOW (ref 6.5–8.1)

## 2018-03-19 LAB — CULTURE, BLOOD (ROUTINE X 2)
Culture: NO GROWTH
Culture: NO GROWTH
Special Requests: ADEQUATE
Special Requests: ADEQUATE

## 2018-03-19 LAB — MAGNESIUM: Magnesium: 2.4 mg/dL (ref 1.7–2.4)

## 2018-03-19 LAB — PHOSPHORUS: Phosphorus: 3.5 mg/dL (ref 2.5–4.6)

## 2018-03-19 LAB — URIC ACID: Uric Acid, Serum: 3.7 mg/dL (ref 3.7–8.6)

## 2018-03-19 MED ORDER — ACETAMINOPHEN 325 MG PO TABS
650.0000 mg | ORAL_TABLET | Freq: Once | ORAL | Status: AC
  Administered 2018-03-19: 650 mg via ORAL
  Filled 2018-03-19: qty 2

## 2018-03-19 MED ORDER — ALLOPURINOL 100 MG PO TABS: 100.0000 mg | ORAL_TABLET | Freq: Two times a day (BID) | ORAL | 0 refills | Status: DC

## 2018-03-19 MED ORDER — SODIUM CHLORIDE 0.9 % IV SOLN
750.0000 mg/m2 | Freq: Once | INTRAVENOUS | Status: AC
  Administered 2018-03-19: 1180 mg via INTRAVENOUS
  Filled 2018-03-19: qty 50

## 2018-03-19 MED ORDER — ENOXAPARIN SODIUM 60 MG/0.6ML ~~LOC~~ SOLN: 55.0000 mg | Freq: Two times a day (BID) | SUBCUTANEOUS | 1 refills | Status: DC

## 2018-03-19 MED ORDER — DIPHENHYDRAMINE HCL 50 MG PO CAPS
50.0000 mg | ORAL_CAPSULE | Freq: Once | ORAL | Status: AC
  Administered 2018-03-19: 50 mg via ORAL
  Filled 2018-03-19: qty 1

## 2018-03-19 MED ORDER — TRAMADOL HCL 50 MG PO TABS: 50.0000 mg | ORAL_TABLET | Freq: Four times a day (QID) | ORAL | 0 refills | Status: DC | PRN

## 2018-03-19 MED ORDER — SODIUM CHLORIDE 0.9 % IV SOLN
Freq: Once | INTRAVENOUS | Status: AC
  Administered 2018-03-19: 16 mg via INTRAVENOUS
  Filled 2018-03-19: qty 8

## 2018-03-19 MED ORDER — SODIUM CHLORIDE 0.9 % IV SOLN
375.0000 mg/m2 | Freq: Once | INTRAVENOUS | Status: AC
  Administered 2018-03-19: 600 mg via INTRAVENOUS
  Filled 2018-03-19: qty 60
  Filled 2018-03-19: qty 50

## 2018-03-19 MED ORDER — POLYETHYLENE GLYCOL 3350 17 G PO PACK: 17.0000 g | PACK | Freq: Every day | ORAL | 0 refills | Status: DC

## 2018-03-19 NOTE — Progress Notes (Signed)
Patient ID: Nicholas Moreno, male   DOB: 31-Jan-1996, 22 y.o.   MRN: [id number]  PROGRESS NOTE    Nicholas Moreno  WUJ:[id number] DOB: 22-Mar-1996 DOA: 03/07/2018 PCP: Patient, No Pcp Per   Brief Narrative:  22 year old Filipino male presented with shortness of breath and was found to have large mediastinal mass, left pleural effusion and left pulmonary embolism.  Started on heparin drip and underwent left-sided thoracentesis by PCCM.  Oncology was consulted.  Underwent VATS and mediastinal mass biopsy by CT surgery.  Status post chest tube placement and removal.  Biopsy confirmed high-grade B-cell lymphoma.  He was also found to have left IJ and subclavian vein DVT.  He was also started on antibiotics for fever.  He was transferred to [facility] to initiate chemotherapy as per oncology recommendations.   Assessment & Plan:   Principal Problem:   Lymphoma of intrathoracic lymph nodes (HCC) Active Problems:   Mediastinal mass   Single subsegmental pulmonary embolism without acute cor pulmonale   Superior vena cava syndrome   S/P thoracentesis   Malnutrition of moderate degree   Counseling regarding advance care planning and goals of care   Acute deep vein thrombosis (DVT) of left upper extremity (HCC)   Encounter for antineoplastic chemotherapy   Pleural effusion   High-grade B-cell lymphoma/mediastinal mass -He underwent VATS with biopsy of mediastinal mass and biopsies of the right lower lobe and left upper lobe masses: Biopsy showed high-grade B-cell lymphoma. -No lymphadenopathy in the abdomen or pelvis and CT of abdomen/pelvis -Status post chest tube placement and removal -Oncology following: Completed 3-day course of dexamethasone.   -Patient was started on chemotherapy on 03/15/2018 per oncology.  Tolerating chemotherapy.  Oncology following.  Fever -Patient was started on cefepime and vancomycin on 03/14/2018 for fever the day prior.  He has been afebrile since then.   No evidence of any infection although CT of the abdomen showed findings suggestive of pyelonephritis.  UA was negative and patient does not have urinary symptoms.   -Antibiotics were discontinued on 03/15/2018.  Monitor off antibiotics.  Currently afebrile.  Cultures negative so far  Left upper extremity DVT with single subsegmental pulmonary embolism -Patient has left IJ and subclavian DVT.  Initially started on heparin drip which was switched to Eliquis by oncology and subsequently switched to Lovenox by oncology.  Oncology is recommending Lovenox for now and transition to Eliquis in 2 months.  Superior vena cava compression from mass -Patient has completed course of dexamethasone  Leukocytosis -Resolved.  Left pleural effusion -Status post thoracentesis -Status post VATS and chest tube placement and removal -Chest x-ray from 03/14/2018 had shown slight decrease in left-sided hydropneumothorax and CT surgery was aware of this   DVT prophylaxis: Lovenox Code Status: Full Family Communication: None at bedside Disposition Plan: Probable discharge home upon completion of chemotherapy on 03/20/2018  Consultants: Oncology/CT surgery/PCCM  Procedures: Thoracentesis, VATS procedure, chest tube placement and removal  Antimicrobials:  Anti-infectives (From admission, onward)   Start     Dose/Rate Route Frequency Ordered Stop   03/14/18 2000  vancomycin (VANCOCIN) IVPB 750 mg/150 ml premix  Status:  Discontinued     750 mg 150 mL/hr over 60 Minutes Intravenous Every 12 hours 03/14/18 0755 03/15/18 1141   03/14/18 0800  ceFEPIme (MAXIPIME) 1 g in sodium chloride 0.9 % 100 mL IVPB  Status:  Discontinued     1 g 200 mL/hr over 30 Minutes Intravenous Every 8 hours 03/14/18 0755 03/15/18 1141   03/14/18 0800  vancomycin (VANCOCIN) 1,250 mg in sodium chloride 0.9 % 250 mL IVPB     1,250 mg 166.7 mL/hr over 90 Minutes Intravenous  Once 03/14/18 0755 03/14/18 1156   03/10/18 2330  ceFAZolin  (ANCEF) IVPB 2g/100 mL premix     2 g 200 mL/hr over 30 Minutes Intravenous Every 8 hours 03/10/18 2001 03/11/18 0739   03/09/18 1319  ceFAZolin (ANCEF) IVPB 2g/100 mL premix     2 g 200 mL/hr over 30 Minutes Intravenous 30 min pre-op 03/09/18 1319 03/10/18 1525         Subjective: Patient seen and examined at bedside.  No new complaints, denies any overnight worsening shortness of breath or fever.   Objective: Vitals:   03/18/18 0555 03/18/18 1235 03/18/18 2231 03/19/18 0610  BP: 101/61 103/62 104/71 (!) 97/53  Pulse: (!) 52 83 (!) 57 (!) 45  Resp: 16 16 18 17   Temp: 97.8 F (36.6 C) 97.8 F (36.6 C) (!) 97.5 F (36.4 C) 97.9 F (36.6 C)  TempSrc: Oral Oral Oral Oral  SpO2: 99% 100% 98% 100%  Weight:      Height:        Intake/Output Summary (Last 24 hours) at 03/19/2018 0853 Last data filed at 03/19/2018 0600 Gross per 24 hour  Intake 1560.35 ml  Output 500 ml  Net 1060.35 ml   Filed Weights   03/07/18 2000 03/08/18 0456 03/10/18 1344  Weight: 52.9 kg 52.9 kg 52.9 kg    Examination:  General exam: No acute distress Respiratory system: Bilateral decreased breath sounds at bases  cardiovascular system: Rate controlled, S1-S2 heard Gastrointestinal system: Abdomen is nondistended, soft and nontender. Normal bowel sounds heard. Extremities: No cyanosis, edema      Data Reviewed: I have personally reviewed following labs and imaging studies  CBC: Recent Labs  Lab 03/15/18 1639 03/16/18 0427 03/17/18 0401 03/18/18 0503 03/19/18 0535  WBC 12.7* 13.9* 11.1* 8.5 8.1  NEUTROABS 11.9* 12.5* 9.9* 7.6 7.5  HGB 12.1* 10.8* 11.2* 11.1* 11.0*  HCT 38.8* 34.5* 35.7* 36.1* 34.7*  MCV 81.5 82.1 82.3 81.9 80.3  PLT 499* 435* 505* 523* 211*   Basic Metabolic Panel: Recent Labs  Lab 03/15/18 1639 03/16/18 0427 03/17/18 0401 03/18/18 0503 03/19/18 0535  NA 138 137 140 140 141  K 4.5 4.0 3.8 3.7 3.6  CL 105 102 106 103 102  CO2 25 26 27 25 29   GLUCOSE 117*  122* 136* 98 101*  BUN 10 14 17 15 13   CREATININE 0.70 0.69 0.52* 0.54* 0.44*  CALCIUM 8.5* 8.6* 8.7* 8.6* 9.0  MG 2.2 1.9 2.3 2.2 2.4  PHOS 3.3 4.4 3.4 3.4 3.5   GFR: Estimated Creatinine Clearance: 109.3 mL/min (A) (by C-G formula based on SCr of 0.44 mg/dL (L)). Liver Function Tests: Recent Labs  Lab 03/15/18 1639 03/16/18 0427 03/17/18 0401 03/18/18 0503 03/19/18 0535  AST 51* 40 31 27 24   ALT 82* 73* 63* 56* 53*  ALKPHOS 101 99 97 79 73  BILITOT 0.5 0.3 0.4 0.4 0.9  PROT 6.2* 5.9* 5.9* 5.7* 5.7*  ALBUMIN 2.6* 2.4* 2.5* 2.4* 2.7*   No results for input(s): LIPASE, AMYLASE in the last 168 hours. No results for input(s): AMMONIA in the last 168 hours. Coagulation Profile: No results for input(s): INR, PROTIME in the last 168 hours. Cardiac Enzymes: No results for input(s): CKTOTAL, CKMB, CKMBINDEX, TROPONINI in the last 168 hours. BNP (last 3 results) No results for input(s): PROBNP in the last 8760 hours. HbA1C:  No results for input(s): HGBA1C in the last 72 hours. CBG: No results for input(s): GLUCAP in the last 168 hours. Lipid Profile: No results for input(s): CHOL, HDL, LDLCALC, TRIG, CHOLHDL, LDLDIRECT in the last 72 hours. Thyroid Function Tests: No results for input(s): TSH, T4TOTAL, FREET4, T3FREE, THYROIDAB in the last 72 hours. Anemia Panel: No results for input(s): VITAMINB12, FOLATE, FERRITIN, TIBC, IRON, RETICCTPCT in the last 72 hours. Sepsis Labs: No results for input(s): PROCALCITON, LATICACIDVEN in the last 168 hours.  Recent Results (from the past 240 hour(s))  Surgical pcr screen     Status: Abnormal   Collection Time: 03/09/18  1:43 PM  Result Value Ref Range Status   MRSA, PCR NEGATIVE NEGATIVE Final   Staphylococcus aureus POSITIVE (A) NEGATIVE Final    Comment: (NOTE) The Xpert SA Assay (FDA approved for NASAL specimens in patients 48 years of age and older), is one component of a comprehensive surveillance program. It is not intended to  diagnose infection nor to guide or monitor treatment.   Anaerobic culture     Status: None   Collection Time: 03/10/18  4:09 PM  Result Value Ref Range Status   Specimen Description BRONCHIAL WASHINGS LEFT  Final   Special Requests NONE  Final   Culture   Final    NO ANAEROBES ISOLATED Performed at [facility], [address] [place], [place] [address]    Report Status 03/15/2018 FINAL  Final  Culture, fungus without smear     Status: None (Preliminary result)   Collection Time: 03/10/18  4:09 PM  Result Value Ref Range Status   Specimen Description BRONCHIAL WASHINGS LEFT  Final   Special Requests NONE  Final   Culture   Final    NO FUNGUS ISOLATED AFTER 7 DAYS Performed at [facility], [place] [address], [place], [place] [address]    Report Status PENDING  Incomplete  Acid Fast Smear (AFB)     Status: None   Collection Time: 03/10/18  4:09 PM  Result Value Ref Range Status   AFB Specimen Processing Concentration  Final   Acid Fast Smear Negative  Final    Comment: (NOTE) Performed At: [facility] [place], [place] [id number] Rush Farmer MD WP:[id number]    Source (AFB) BRONCHIAL WASHINGS  Final    Comment: LEFT Performed at [facility], [place] [address], [place], [place] [address]   Culture, respiratory     Status: None   Collection Time: 03/10/18  4:09 PM  Result Value Ref Range Status   Specimen Description BRONCHIAL WASHINGS LEFT  Final   Special Requests NONE  Final   Gram Stain   Final    FEW WBC PRESENT, PREDOMINANTLY PMN NO ORGANISMS SEEN    Culture   Final    RARE Consistent with normal respiratory flora. Performed at [facility], [place] [address] [place],  [address]    Report Status 03/12/2018 FINAL  Final  Culture, blood (Routine X 2) w Reflex to ID Panel     Status: None (Preliminary result)   Collection Time: 03/14/18  7:36 AM  Result Value Ref Range Status   Specimen Description BLOOD LEFT  HAND  Final   Special Requests   Final    BOTTLES DRAWN AEROBIC AND ANAEROBIC Blood Culture adequate volume   Culture   Final    NO GROWTH 4 DAYS Performed at [facility], [place] [address] [place], [place]  [address]    Report Status PENDING  Incomplete  Culture, blood (Routine X 2) w Reflex to ID Panel     Status: None (Preliminary result)   Collection Time: 03/14/18  7:37 AM  Result Value Ref Range Status   Specimen Description BLOOD RIGHT ANTECUBITAL  Final   Special Requests   Final    BOTTLES DRAWN AEROBIC AND ANAEROBIC Blood Culture adequate volume   Culture   Final    NO GROWTH 4 DAYS Performed at [facility], [place] [address] [place], [place] [address]    Report Status PENDING  Incomplete  Urine Culture     Status: None   Collection Time: 03/14/18  2:58 PM  Result Value Ref Range Status   Specimen Description URINE, CLEAN CATCH  Final   Special Requests Immunocompromised  Final   Culture   Final    NO GROWTH Performed at [facility], [place] [address], [place], [place] [address]    Report Status 03/15/2018 FINAL  Final         Radiology Studies: Dg Chest 2 View  Result Date: 03/18/2018 CLINICAL DATA:  22 year old who presented on 03/07/2018 with a large mediastinal mass and small LEFT upper lobe pulmonary emboli. Patient also has a small LEFT pleural effusion for which she underwent prior thoracentesis. 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 EXAM: CHEST - 2 VIEW COMPARISON:  03/14/2018 and earlier, including CTA chest 03/07/2018. FINDINGS: RIGHT arm PICC tip remains at or near the cavoatrial junction. Large mediastinal mass as noted previously. Stable moderate-sized LEFT pleural effusion, a partially loculated hydropneumothorax as there is an air-fluid level in the LATERAL pleural space. Passive atelectasis in the LEFT lung, with slight improved aeration in the LEFT LOWER LOBE since the examination 4 days  ago. Stable very small RIGHT pleural effusion. No new pulmonary parenchymal abnormalities. IMPRESSION: 1. Stable large mediastinal mass. 2. Stable moderate-sized LEFT pleural effusion, a partially loculated hydropneumothorax. 3. Improved aeration in the LEFT LOWER LOBE since the examination 4 days ago. 4. No new abnormalities. Electronically Signed   By: Evangeline Dakin M.D.   On: 03/18/2018 13:28   Dg Chest Bilateral Decubitus  Result Date: 03/18/2018 CLINICAL DATA:  Large mediastinal mass and moderate-sized LEFT pleural effusion (partially loculated hydropneumothorax) and small RIGHT pleural effusion. EXAM: CHEST - BILATERAL DECUBITUS VIEW COMPARISON:  Chest x-rays earlier same day and previously. FINDINGS: Very small freely layering RIGHT pleural effusion. Partially loculated LEFT hydropneumothorax, though there is a freely layering component to the LEFT pleural effusion. IMPRESSION: 1. Partially loculated LEFT hydropneumothorax though there is a freely layering component to the LEFT pleural effusion. 2. Freely layering very small RIGHT pleural effusion. Electronically Signed   By: Evangeline Dakin M.D.   On: 03/18/2018 13:29        Scheduled Meds: . allopurinol  100 mg Oral BID  . bisacodyl  10 mg Oral Daily  . dextromethorphan-guaiFENesin  1 tablet Oral BID  . 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 enoxaparin (LOVENOX) injection  1 mg/kg Subcutaneous Q12H  . feeding supplement (ENSURE ENLIVE)  237 mL Oral BID BM  . multivitamin with minerals  1 tablet Oral Daily  . pantoprazole  40 mg Oral Daily  . polyethylene glycol  17 g Oral Daily  . sodium chloride flush  10-40 mL Intracatheter Q12H   Continuous Infusions: . sodium chloride Stopped (03/15/18 1409)  . 0.9 % NaCl  with KCl 20 mEq / L 10 mL/hr at 03/19/18 0100  . potassium chloride       LOS: 12 days        [name], MD [facility] Pager [phone]  If  7PM-7AM, please contact night-coverage [email or web address] Password [facility] 03/19/2018, 8:53 AM

## 2018-03-19 NOTE — Progress Notes (Addendum)
Dc instructions presented to PT and parents. All questions were answered. They verbalized understanding to scripts and appts.     1835 Pt walked out with his mother. Gait is steady.

## 2018-03-19 NOTE — Progress Notes (Signed)
PICC line removed per order and intact. Vaseline gauze/gauze dressing applied. Clean, dry, intact. Patient aware to stay in bed for 30 minutes and to not get the dressing wet or take it off for 24 hours.

## 2018-03-19 NOTE — Progress Notes (Addendum)
Cytoxan and Rituxan dosages and calculations verified with Jocelyn Lamer, RN.    Chemo calculations got Cytoxan  and Rituxan  Verified with Skip Mayer RN

## 2018-03-21 ENCOUNTER — Inpatient Hospital Stay: Payer: BLUE CROSS/BLUE SHIELD | Attending: Hematology

## 2018-03-21 ENCOUNTER — Other Ambulatory Visit: Payer: Self-pay

## 2018-03-21 ENCOUNTER — Telehealth: Payer: Self-pay

## 2018-03-21 ENCOUNTER — Ambulatory Visit (INDEPENDENT_AMBULATORY_CARE_PROVIDER_SITE_OTHER): Payer: Self-pay

## 2018-03-21 ENCOUNTER — Other Ambulatory Visit: Payer: Self-pay | Admitting: Hematology

## 2018-03-21 ENCOUNTER — Telehealth: Payer: Self-pay | Admitting: *Deleted

## 2018-03-21 DIAGNOSIS — Z7901 Long term (current) use of anticoagulants: Secondary | ICD-10-CM | POA: Insufficient documentation

## 2018-03-21 DIAGNOSIS — I82C12 Acute embolism and thrombosis of left internal jugular vein: Secondary | ICD-10-CM | POA: Diagnosis not present

## 2018-03-21 DIAGNOSIS — Z79899 Other long term (current) drug therapy: Secondary | ICD-10-CM | POA: Insufficient documentation

## 2018-03-21 DIAGNOSIS — C8528 Mediastinal (thymic) large B-cell lymphoma, lymph nodes of multiple sites: Secondary | ICD-10-CM

## 2018-03-21 DIAGNOSIS — Z5189 Encounter for other specified aftercare: Secondary | ICD-10-CM | POA: Diagnosis not present

## 2018-03-21 DIAGNOSIS — Z7189 Other specified counseling: Secondary | ICD-10-CM

## 2018-03-21 DIAGNOSIS — C8522 Mediastinal (thymic) large B-cell lymphoma, intrathoracic lymph nodes: Secondary | ICD-10-CM

## 2018-03-21 DIAGNOSIS — C8520 Mediastinal (thymic) large B-cell lymphoma, unspecified site: Secondary | ICD-10-CM | POA: Insufficient documentation

## 2018-03-21 DIAGNOSIS — Z4802 Encounter for removal of sutures: Secondary | ICD-10-CM

## 2018-03-21 DIAGNOSIS — I2699 Other pulmonary embolism without acute cor pulmonale: Secondary | ICD-10-CM | POA: Diagnosis not present

## 2018-03-21 MED ORDER — PEGFILGRASTIM-CBQV 6 MG/0.6ML ~~LOC~~ SOSY
6.0000 mg | PREFILLED_SYRINGE | Freq: Once | SUBCUTANEOUS | Status: AC
  Administered 2018-03-21: 6 mg via SUBCUTANEOUS

## 2018-03-21 NOTE — Telephone Encounter (Signed)
Patient stopped by to check on his Ref. As a hospital f/u appt. Sent a sch. msg to [place] with contact informatin for the patient. Per 12/13 walk in

## 2018-03-21 NOTE — Patient Instructions (Signed)
Pegfilgrastim injection What is this medicine? PEGFILGRASTIM (PEG fil gra stim) is a long-acting granulocyte colony-stimulating factor that stimulates the growth of neutrophils, a type of white blood cell important in the body's fight against infection. It is used to reduce the incidence of fever and infection in patients with certain types of cancer who are receiving chemotherapy that affects the bone marrow, and to increase survival after being exposed to high doses of radiation. This medicine may be used for other purposes; ask your health care provider or pharmacist if you have questions. COMMON BRAND NAME(S): Neulasta What should I tell my health care provider before I take this medicine? They need to know if you have any of these conditions: -kidney disease -latex allergy -ongoing radiation therapy -sickle cell disease -skin reactions to acrylic adhesives (On-Body Injector only) -an unusual or allergic reaction to pegfilgrastim, filgrastim, other medicines, foods, dyes, or preservatives -pregnant or trying to get pregnant -breast-feeding How should I use this medicine? This medicine is for injection under the skin. If you get this medicine at home, you will be taught how to prepare and give the pre-filled syringe or how to use the On-body Injector. Refer to the patient Instructions for Use for detailed instructions. Use exactly as directed. Tell your healthcare provider immediately if you suspect that the On-body Injector may not have performed as intended or if you suspect the use of the On-body Injector resulted in a missed or partial dose. It is important that you put your used needles and syringes in a special sharps container. Do not put them in a trash can. If you do not have a sharps container, call your pharmacist or healthcare provider to get one. Talk to your pediatrician regarding the use of this medicine in children. While this drug may be prescribed for selected conditions,  precautions do apply. Overdosage: If you think you have taken too much of this medicine contact a poison control center or emergency room at once. NOTE: This medicine is only for you. Do not share this medicine with others. What if I miss a dose? It is important not to miss your dose. Call your doctor or health care professional if you miss your dose. If you miss a dose due to an On-body Injector failure or leakage, a new dose should be administered as soon as possible using a single prefilled syringe for manual use. What may interact with this medicine? Interactions have not been studied. Give your health care provider a list of all the medicines, herbs, non-prescription drugs, or dietary supplements you use. Also tell them if you smoke, drink alcohol, or use illegal drugs. Some items may interact with your medicine. This list may not describe all possible interactions. Give your health care provider a list of all the medicines, herbs, non-prescription drugs, or dietary supplements you use. Also tell them if you smoke, drink alcohol, or use illegal drugs. Some items may interact with your medicine. What should I watch for while using this medicine? You may need blood work done while you are taking this medicine. If you are going to need a MRI, CT scan, or other procedure, tell your doctor that you are using this medicine (On-Body Injector only). What side effects may I notice from receiving this medicine? Side effects that you should report to your doctor or health care professional as soon as possible: -allergic reactions like skin rash, itching or hives, swelling of the face, lips, or tongue -dizziness -fever -pain, redness, or irritation at site   where injected -pinpoint red spots on the skin -red or dark-brown urine -shortness of breath or breathing problems -stomach or side pain, or pain at the shoulder -swelling -tiredness -trouble passing urine or change in the amount of urine Side  effects that usually do not require medical attention (report to your doctor or health care professional if they continue or are bothersome): -bone pain -muscle pain This list may not describe all possible side effects. Call your doctor for medical advice about side effects. You may report side effects to FDA at 1-800-FDA-1088. Where should I keep my medicine? Keep out of the reach of children. Store pre-filled syringes in a refrigerator between 2 and 8 degrees C (36 and 46 degrees F). Do not freeze. Keep in carton to protect from light. Throw away this medicine if it is left out of the refrigerator for more than 48 hours. Throw away any unused medicine after the expiration date. NOTE: This sheet is a summary. It may not cover all possible information. If you have questions about this medicine, talk to your doctor, pharmacist, or health care provider.  2018 Elsevier/Gold Standard (2016-03-22 12:58:03)  

## 2018-03-21 NOTE — Telephone Encounter (Signed)
Ms. Blazejewski contacted office for appt schedule for son for next week. Scheduling message for LAB/MD hospital f/u sent at Dr. Grier Mitts request. Advised mother that scheduling will contact her. She verbalized understanding.

## 2018-03-21 NOTE — Progress Notes (Signed)
Patient arrived for nurse visit to remove suture/staples post- procedure VATS 03/10/18 with Dr. Prescott Gum.  1 suture removed with no signs/ symptoms of infection noted.  Patient tolerated procedure well.  Once suture removed incision dehisced very minimally but did have a scant amount of serosanguinous fluid drain from the site.  I advised the patient that this type of drainage was normal and he could remove the dry dressing placed over top tomorrow.  I instructed him to clean the area with soap and water and pat dry.  I also advised to make sure he watched for signs of infection, like increased drainage, change in fluid color or thickness, or began to run a temperature.  Patient acknowledged instructions given and verbalized receipt.

## 2018-03-23 NOTE — Discharge Summary (Signed)
.  [facility]  [phone] [phone]    Physician Discharge Summary     Patient ID: Nicholas Moreno MRN: [id number] DOB/AGE: Oct 28, 1995 21 y.o.  Admit date: 03/07/2018 Discharge date: .03/19/2018  Primary Care Physician:  Patient, No Pcp Per   Discharge Diagnoses:  Newly diagnosed Primary mediastinal B cell NOn hodgkins lymphoma, DVT, PE, malignant pleural effusion  Present on Admission: **None**   Discharge Medications:  Allergies as of 03/19/2018   No Known Allergies     Medication List    STOP taking these medications   OVER THE COUNTER MEDICATION   vitamin C 500 MG tablet Commonly known as:  ASCORBIC ACID     TAKE these medications   albuterol 108 (90 Base) MCG/ACT inhaler Commonly known as:  PROVENTIL HFA;VENTOLIN HFA Inhale 2 puffs into the lungs every 4 (four) hours as needed for wheezing or shortness of breath.   allopurinol 100 MG tablet Commonly known as:  ZYLOPRIM Take 1 tablet (100 mg total) by mouth 2 (two) times daily.   enoxaparin 60 MG/0.6ML injection Commonly known as:  LOVENOX Inject 0.55 mLs (55 mg total) into the skin every 12 (twelve) hours.   fluticasone 110 MCG/ACT inhaler Commonly known as:  FLOVENT HFA Inhale 2 puffs into the lungs 2 (two) times daily.   polyethylene glycol packet Commonly known as:  MIRALAX / GLYCOLAX Take 17 g by mouth daily.   traMADol 50 MG tablet Commonly known as:  ULTRAM Take 1 tablet (50 mg total) by mouth every 6 (six) hours as needed (mild pain).        Disposition and Follow-up:   Significant Diagnostic Studies:  Dg Chest 2 View  Result Date: 03/18/2018 CLINICAL DATA:  22 year old who presented on 03/07/2018 with a large mediastinal mass and small LEFT upper lobe pulmonary emboli. Patient also has a small LEFT pleural effusion for which she underwent prior thoracentesis. Biopsy of the mass revealed an atypical lymphoid population likely  indicating lymphoma. EXAM: CHEST - 2 VIEW COMPARISON:  03/14/2018 and earlier, including CTA chest 03/07/2018. FINDINGS: RIGHT arm PICC tip remains at or near the cavoatrial junction. Large mediastinal mass as noted previously. Stable moderate-sized LEFT pleural effusion, a partially loculated hydropneumothorax as there is an air-fluid level in the LATERAL pleural space. Passive atelectasis in the LEFT lung, with slight improved aeration in the LEFT LOWER LOBE since the examination 4 days ago. Stable very small RIGHT pleural effusion. No new pulmonary parenchymal abnormalities. IMPRESSION: 1. Stable large mediastinal mass. 2. Stable moderate-sized LEFT pleural effusion, a partially loculated hydropneumothorax. 3. Improved aeration in the LEFT LOWER LOBE since the examination 4 days ago. 4. No new abnormalities. Electronically Signed   By: Evangeline Dakin M.D.   On: 03/18/2018 13:28   Dg Chest 2 View  Result Date: 03/14/2018 CLINICAL DATA:  Shortness of breath EXAM: CHEST - 2 VIEW COMPARISON:  03/13/2018 FINDINGS: Right jugular central line is again seen. Left-sided apical pneumothorax is again noted and slightly improved when compared with the prior exam. Persistent mediastinal masses are noted stable from the prior exam. Persistent small left pleural effusion is noted as well as left basilar consolidation. IMPRESSION: Slight decrease in left-sided hydropneumothorax. Persistent mediastinal masses with evidence of left basilar consolidation. Electronically Signed   By: Inez Catalina M.D.   On: 03/14/2018 07:16   Dg Chest 2 View  Result Date: 03/13/2018 CLINICAL DATA:  Follow-up LEFT hydropneumothorax after chest tube removal. EXAM: CHEST - 2 VIEW COMPARISON:  03/12/2018 dating back to 03/08/2018. CT chest 03/07/2018. FINDINGS: RIGHT jugular central venous catheter tip projects over the LOWER SVC, unchanged stable LEFT apicolateral pneumothorax since yesterday, on the order of 15% or so. Loculated LEFT pleural  effusion with air-fluid levels, unchanged. Massive mediastinal mass with compression of the LEFT lung and compression of the MEDIAL RIGHT UPPER LOBE as noted on the prior CT. Slight improved aeration in the LEFT UPPER LOBE since yesterday. No new abnormalities. IMPRESSION: 1. Stable LEFT apicolateral pneumothorax and loculated LEFT hydropneumothorax after chest tube removal. 2. Slight improved aeration in the LEFT UPPER LOBE since yesterday. 3. Otherwise, no change in the massive mediastinal mass with compressive atelectasis involving much of the LEFT lung and the MEDIAL RIGHT UPPER LOBE. 4. No new abnormalities. Electronically Signed   By: Evangeline Dakin M.D.   On: 03/13/2018 09:17   Ct Angio Chest Pe W And/or Wo Contrast  Result Date: 03/07/2018 CLINICAL DATA:  Shortness of breath and cough. EXAM: CT ANGIOGRAPHY CHEST WITH CONTRAST TECHNIQUE: Multidetector CT imaging of the chest was performed using the standard protocol during bolus administration of intravenous contrast. Multiplanar CT image reconstructions and MIPs were obtained to evaluate the vascular anatomy. CONTRAST:  158mL ISOVUE-370 IOPAMIDOL (ISOVUE-370) INJECTION 76% COMPARISON:  None. FINDINGS: Cardiovascular: Normal heart size. The main pulmonary artery appears patent. Filling defect within the left upper lobe lobar pulmonary artery, image 104 through image 101 compatible with acute pulmonary emboli. Mediastinum/Nodes: A very large anterior mediastinal mass is identified which extends into both upper lung zones. This mass encases and narrows the superior vena cava as well as the branch vessels off the aortic arch. There is extensive chest wall collaterals especially in the left supraclavicular region and paraspinal region. Encasement and narrowing of the left main pulmonary artery and bilateral upper lobe pulmonary arteries. Encasement and narrowing of the trachea is identified with rightward and posterior displacement. The left mainstem  bronchus is narrowed, and bilateral upper lobe bronchi are occluded. Narrowing of the left lower lobe bronchi. Lungs/Pleura: There is a moderate left pleural effusion. Approximately 90% opacification of the left upper lobe is identified secondary to tumor and postobstructive consolidation. A small amount of aerated lung within the apex. Compressive type atelectasis is noted within the left lung base. There is approximately 50% opacification of the right upper lobe with patchy areas of airspace consolidation and ground-glass attenuation within the remaining portions of the right upper lobe. The mass within the right upper lobe contains central areas of cavitation compatible with necrosis. Upper Abdomen: No acute findings. Musculoskeletal: No chest wall abnormality. No acute or significant osseous findings. Review of the MIP images confirms the above findings. IMPRESSION: 1. Small acute pulmonary emboli identified within the distal left upper lobe lobar pulmonary artery. 2. 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 Primary differential considerations include lymphoma and leukemia as well as malignant derm cell tumors. 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 Narrowing and displacement of the trachea with complete occlusion of the upper lobe bronchi. There is also marked narrowing of bilateral upper lobe pulmonary arteries. 3. Significantly diminished aeration to both upper lobes, left greater than right. 4. Moderate left pleural effusion. Critical Value/emergent results were called by telephone at the time of interpretation on 03/07/2018 at 5:29 pm to Dr. Carlisle Cater , who verbally acknowledged these results. Electronically  Signed   By: Kerby Moors M.D.   On: 03/07/2018 17:30   Dg Chest Bilateral  Decubitus  Result Date: 03/18/2018 CLINICAL DATA:  Large mediastinal mass and moderate-sized LEFT pleural effusion (partially loculated hydropneumothorax) and small RIGHT pleural effusion. EXAM: CHEST - BILATERAL DECUBITUS VIEW COMPARISON:  Chest x-rays earlier same day and previously. FINDINGS: Very small freely layering RIGHT pleural effusion. Partially loculated LEFT hydropneumothorax, though there is a freely layering component to the LEFT pleural effusion. IMPRESSION: 1. Partially loculated LEFT hydropneumothorax though there is a freely layering component to the LEFT pleural effusion. 2. Freely layering very small RIGHT pleural effusion. Electronically Signed   By: Evangeline Dakin M.D.   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 Status post mediastinoscopy and VATS. EXAM: CT ABDOMEN AND PELVIS WITH CONTRAST TECHNIQUE: Multidetector CT imaging of the abdomen and pelvis was performed using the standard protocol following bolus administration of intravenous contrast. CONTRAST:  182mL OMNIPAQUE IOHEXOL 300 MG/ML  SOLN COMPARISON:  Recent chest CT 03/07/2018 FINDINGS: Lower chest: Persistent left pleural effusion containing air from recent surgery. There is also air in the subcutaneous tissues of the anterior chest wall. Left lower lobe atelectasis. The heart is normal in size. The distal esophagus is grossly normal. Small right pleural effusion. The right lung base is grossly clear. Hepatobiliary: No focal hepatic lesions or intrahepatic biliary dilatation. The gallbladder is normal. No common bile duct dilatation. Pancreas: No mass, inflammation or ductal dilatation. Spleen: Normal size.  No focal lesions. Adrenals/Urinary Tract: Adrenal glands are unremarkable. Both kidneys demonstrate patchy peripheral perfusion defects typically seen with pyelonephritis. Recommend correlation with urinalysis.  Stomach/Bowel: The stomach, duodenum, small bowel and colon are grossly normal. No acute inflammatory changes, mass lesions or obstructive findings. Vascular/Lymphatic: No mesenteric or retroperitoneal mass or adenopathy. The aorta and branch vessels are normal. The major venous structures are patent. Reproductive: The prostate gland and seminal vesicles are unremarkable. 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 Musculoskeletal: No significant bony findings. No worrisome bone lesions. IMPRESSION: 1. Bilateral pleural effusions, left greater than right with left lower lobe atelectasis. There is also some pleural air and subcutaneous air on the left side consistent with recent surgery. 2. No lymphadenopathy in the abdomen or pelvis. 3. Patchy perfusion abnormalities involving both kidneys most typically seen with pyelonephritis. Recommend correlation with urinalysis. 4. Body wall edema, mesenteric edema and free pelvic fluid as above. Electronically Signed   By: Marijo Sanes M.D.   On: 03/13/2018 19:32   Dg Chest Port 1 View  Result Date: 03/12/2018 CLINICAL DATA:  22 year old male with mediastinal mass, status post VATS and biopsy with mediastinoscopy 05/12/2017, suspected lymphoma. EXAM: PORTABLE CHEST 1 VIEW COMPARISON:  Chest x-ray 03/11/2018, 03/10/2018, 03/08/2018, CT 03/07/2018 FINDINGS: Cardiomediastinal silhouette partially obscured by lung/pleural disease/mediastinal disease, unchanged. In the interval there has been development of left apical pneumothorax component. Opacity at the left base obscuring the left costophrenic angle in the left heart border. Unchanged left thoracostomy tube. Right IJ central venous catheter, unchanged. No right-sided confluent airspace disease. No pleural effusion or pneumothorax. IMPRESSION: Interval development of left apical  pneumothorax, either ex vacuo, or representing air leak. Unchanged position of thoracostomy tube. Unchanged right IJ central venous catheter. Similar appearance of cardiomediastinal silhouette,  with obscuration of the heart borders and predominantly left lung by known mediastinal mass and left lung consolidation, status post VATS and mediastinoscopy. These results were called by telephone at the time of interpretation on 03/12/2018 at 10:06 am to nurse caring for patient, Ms Lilia Pro in [facility]. Electronically Signed   By: Corrie Mckusick D.O.   On: 03/12/2018 10:07   Dg Chest Port 1 View  Result Date: 03/11/2018 CLINICAL DATA:  Postop chest biopsy.  Chest tube. EXAM: PORTABLE CHEST 1 VIEW COMPARISON:  03/10/2018 FINDINGS: Near complete opacification of the left chest again noted due to large mediastinal mass and collapse of the left lung. Left pleural effusion is present. Left chest tube remains in place and unchanged. No pneumothorax. Mediastinal mass is present crossing the midline to the right unchanged. Remainder of the right lung is normally aerated Right jugular central venous catheter tip in the mid SVC unchanged. No pneumothorax on the right. IMPRESSION: Large mediastinal mass unchanged. Compression of the left lung with left effusion unchanged. Left chest tube in place No pneumothorax. Electronically Signed   By: Franchot Gallo M.D.   On: 03/11/2018 08:53   Dg Chest Port 1 View  Result Date: 03/10/2018 CLINICAL DATA:  Post left VATS.  Evaluate pneumothorax.  Chest tube. EXAM: PORTABLE CHEST 1 VIEW COMPARISON:  03/08/2018 FINDINGS: Interval placement of left chest tube. Continued large left pleural effusion and diffuse airspace disease throughout the left lung. No pneumothorax. Large rounded masslike opacity in the medial right upper lobe is stable. Right central line is in place with the tip in the SVC. No pneumothorax. IMPRESSION: Interval placement of left chest tube and right central line. No  pneumothorax. Continued large left pleural effusion and near complete opacification of the left lung. Continued medial right upper lobe mass. Electronically Signed   By: Rolm Baptise M.D.   On: 03/10/2018 20:53   Dg Chest Port 1 View  Result Date: 03/08/2018 CLINICAL DATA:  Status post left thoracentesis EXAM: PORTABLE CHEST 1 VIEW COMPARISON:  Chest CT from 1 day prior FINDINGS: Near complete opacification of the left hemithorax with asymmetric volume loss in the left hemithorax, not substantially changed. Marked thickening of the upper mediastinum bilaterally, unchanged. Cardiac silhouette is obscured, with the heart appearing normal in size. No pneumothorax. No right pleural effusion. Persistent moderate left pleural effusion. Stable masslike opacity in the medial upper right lung with right upper lobe volume loss. IMPRESSION: 1. No pneumothorax.  Persistent moderate left pleural effusion. 2. Stable marked thickening of the upper mediastinum bilaterally compatible with known infiltrative anterior mediastinal mass. 3. Persistent volume loss in the left hemithorax and right upper lung. Stable near complete left lung opacification and masslike medial right upper lung opacity compatible with a combination of atelectasis and tumor. Electronically Signed   By: Ilona Sorrel M.D.   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 Comparison Study: No prior study on file for comparison Performing Technologist: Sharion Dove RVS  Examination Guidelines: A complete evaluation includes B-mode imaging, spectral Doppler, color Doppler, and power Doppler as needed of all accessible portions of each vessel. Bilateral testing is considered an integral part of a complete examination. Limited examinations for reoccurring  indications may be performed as noted.  Right Venous Findings: +---------+---------------+---------+-----------+----------+-------+  CompressibilityPhasicitySpontaneityPropertiesSummary +---------+---------------+---------+-----------+----------+-------+ CFV      Full           Yes      Yes                          +---------+---------------+---------+-----------+----------+-------+ SFJ      Full                                                 +---------+---------------+---------+-----------+----------+-------+ FV Prox  Full                                                 +---------+---------------+---------+-----------+----------+-------+ FV Mid   Full                                                 +---------+---------------+---------+-----------+----------+-------+ FV DistalFull                                                 +---------+---------------+---------+-----------+----------+-------+ PFV      Full                                                 +---------+---------------+---------+-----------+----------+-------+ POP      Full           Yes      Yes                          +---------+---------------+---------+-----------+----------+-------+ PTV      Full                                                 +---------+---------------+---------+-----------+----------+-------+ PERO     Full                                                 +---------+---------------+---------+-----------+----------+-------+  Left Venous Findings: +---------+---------------+---------+-----------+----------+-------+          CompressibilityPhasicitySpontaneityPropertiesSummary +---------+---------------+---------+-----------+----------+-------+ CFV      Full           Yes      Yes                          +---------+---------------+---------+-----------+----------+-------+ SFJ      Full                                                  +---------+---------------+---------+-----------+----------+-------+  FV Prox  Full                                                 +---------+---------------+---------+-----------+----------+-------+ FV Mid   Full                                                 +---------+---------------+---------+-----------+----------+-------+ FV DistalFull                                                 +---------+---------------+---------+-----------+----------+-------+ PFV      Full                                                 +---------+---------------+---------+-----------+----------+-------+ POP      Full           Yes      Yes                          +---------+---------------+---------+-----------+----------+-------+ PTV      Full                                                 +---------+---------------+---------+-----------+----------+-------+ PERO     Full                                                 +---------+---------------+---------+-----------+----------+-------+    Summary: Right: There is no evidence of deep vein thrombosis in the lower extremity. Sluggish flow noted throughout Left: There is no evidence of deep vein thrombosis in the lower extremity. Sluggish flow noted throughout  *See table(s) above for measurements and observations. Electronically signed by Servando Snare MD on 03/09/2018 at 8:52:35 PM.    Final    Vas [place] Upper Extremity Venous Duplex  Result Date: 03/12/2018 UPPER VENOUS STUDY  Indications: Edema, and SVC syndrome, chest mass Performing Technologist: Landry Mellow RDMS, RVT  Examination Guidelines: A complete evaluation includes B-mode imaging, spectral Doppler, color Doppler, and power Doppler as needed of all accessible portions of each vessel. Bilateral testing is considered an integral part of a complete examination. Limited examinations for reoccurring indications may be performed as noted.  Right Findings:  +----------+------------+----------+---------+-----------+---------------------+ RIGHT     CompressiblePropertiesPhasicitySpontaneous       Summary        +----------+------------+----------+---------+-----------+---------------------+ IJV                                                  unable to image due  to IJ line       +----------+------------+----------+---------+-----------+---------------------+ Subclavian    Full                 Yes       Yes                          +----------+------------+----------+---------+-----------+---------------------+ Axillary      Full                 Yes       Yes                          +----------+------------+----------+---------+-----------+---------------------+ Brachial      Full                 Yes       Yes                          +----------+------------+----------+---------+-----------+---------------------+ Radial        Full                 Yes       Yes                          +----------+------------+----------+---------+-----------+---------------------+ Ulnar         Full                                                        +----------+------------+----------+---------+-----------+---------------------+ Cephalic      Full                                                        +----------+------------+----------+---------+-----------+---------------------+ Basilic       Full                                                        +----------+------------+----------+---------+-----------+---------------------+  Left Findings: +----------+------------+----------+---------+-----------+-------+ LEFT      CompressiblePropertiesPhasicitySpontaneousSummary +----------+------------+----------+---------+-----------+-------+ IJV         Partial                Yes       Yes     Acute   +----------+------------+----------+---------+-----------+-------+ Subclavian  Partial                Yes       Yes     Acute  +----------+------------+----------+---------+-----------+-------+ Axillary      Full                 Yes       Yes            +----------+------------+----------+---------+-----------+-------+ Brachial      Full                 Yes       Yes            +----------+------------+----------+---------+-----------+-------+  Radial        Full                                          +----------+------------+----------+---------+-----------+-------+ Ulnar         Full                                          +----------+------------+----------+---------+-----------+-------+ Cephalic      Full                                          +----------+------------+----------+---------+-----------+-------+ Basilic       Full                                          +----------+------------+----------+---------+-----------+-------+  Summary:  Right: No evidence of deep vein thrombosis in the upper extremity. No evidence of superficial vein thrombosis in the upper extremity.  Left: No evidence of superficial vein thrombosis in the upper extremity. Findings consistent with acute deep vein thrombosis involving the left internal jugular veins and left subclavian veins.  *See table(s) above for measurements and observations.  Diagnosing physician: Quay Burow MD Electronically signed by Quay Burow MD on 03/12/2018 at 4:53:40 PM.    Final    [place] Ekg Site Rite  Result Date: 03/14/2018 If Site Rite image not attached, placement could not be confirmed due to current cardiac rhythm.   Discharge Laboratory Values: . CBC Latest Ref Rng & Units 03/19/2018 03/18/2018 03/17/2018  WBC 4.0 - 10.5 K/uL 8.1 8.5 11.1(H)  Hemoglobin 13.0 - 17.0 g/dL 11.0(L) 11.1(L) 11.2(L)  Hematocrit 39.0 - 52.0 % 34.7(L) 36.1(L) 35.7(L)  Platelets 150 - 400 K/uL 517(H) 523(H)  505(H)    . CMP Latest Ref Rng & Units 03/19/2018 03/18/2018 03/17/2018  Glucose 70 - 99 mg/dL 101(H) 98 136(H)  BUN 6 - 20 mg/dL 13 15 17   Creatinine 0.61 - 1.24 mg/dL 0.44(L) 0.54(L) 0.52(L)  Sodium 135 - 145 mmol/L 141 140 140  Potassium 3.5 - 5.1 mmol/L 3.6 3.7 3.8  Chloride 98 - 111 mmol/L 102 103 106  CO2 22 - 32 mmol/L 29 25 27   Calcium 8.9 - 10.3 mg/dL 9.0 8.6(L) 8.7(L)  Total Protein 6.5 - 8.1 g/dL 5.7(L) 5.7(L) 5.9(L)  Total Bilirubin 0.3 - 1.2 mg/dL 0.9 0.4 0.4  Alkaline Phos 38 - 126 U/L 73 79 97  AST 15 - 41 U/L 24 27 31   ALT 0 - 44 U/L 53(H) 56(H) 63(H)     Brief H and P: For complete details please refer to admission H and P, but in brief, Nicholas Moreno is a wonderful 22 y.o. 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 Patient notes that he started feeling unwell in August when he noted gradually increasing fatigue and weight loss.  Patient  notes subsequently in September and October he developed new cough with progressive shortness of breath which triggered multiple visits with his primary care physician and was treated symptomatically and with inhalers.  Patient reports no imaging studies at the time.  Patient reports he has felt poorly during the last 2 to 3 months and during his recent visits to the [place] in [place]. He reports a total unintentional weight loss of close to 20 pounds. He also reports drenching night sweats over the last 4 to 6 weeks.  He reports that his cough and shortness of breath progressively got much more severe which led to him presenting to the emergency room yesterday. Notes nonproductive cough. No overt fevers.  Patient  had a CTA of the chest on 03/07/2018 which showed  "Small acute pulmonary emboli identified within the distal left upper lobe lobar pulmonary artery. 2. 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 Primary differential considerations include lymphoma and leukemia as well as malignant derm cell tumors. 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 Narrowing and displacement of the trachea with complete occlusion of the upper lobe bronchi. There is also marked narrowing of bilateral upper lobe pulmonary arteries. 3. Significantly diminished aeration to both upper lobes, left greater than right. 4. Moderate left pleural effusion."  Patient is currently on room air and saturating 99% with stable hemodynamics. He has been started on IV heparin due to his small pulmonary embolism. 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  No headaches.  No facial swelling.  No upper extremity swelling.  No overt clinical symptomatology of SVC syndrome despite radiographic findings of some SVC narrowing.  Issues during hospitalization  22 y.o. male with  #1Massive anterior mediastinal partially cavitary mass- consistent with Primary Mediastinal B cell Non Hodgkins lymphoma  #2 SVC compression due to mediastinal mass without overt SVC syndrome clinical symptoms #3 small acute distal left upper lobe pulmonary embolus-on IV heparin #4 Acute Left IJ and Subclavian DVT due to left sided venous compression due to mass and due to malignancy -on lovenox for malignancy related thrombosis. #4 left-sided pleural effusion and left-sided lung atelectasis due to airway compression from  mediastinal mass - improved on CXR yesterday #5 significant weight loss of 20 pounds likely due to lymphoma - resolving with treatment. #6 drenching night sweats likely has constitutional symptoms from his PMBCL - improved/resolved with steroids. #7 Fever - due to DVT vs post-obstructive pneumonia vs due to lymphoma/pleuritis   PLAN:  -Discussed pt labwork today, 03/19/18; blood counts and chemistries are stable. Uric acid stable at 3.7. -patient was treated urgently with C1 of R+EPOCH-R In the hospital and tolerated it with no significant toxicities and had improving functional status and breathing. -Continue eating well, staying hydrated, and staying as active as reasonably possible  -Discussed that if the patient continues tolerating treatment well, we will consider dose escalation for future cycles  -Will send supportive medications and Lovenox to the patient's summary   -Discussed the 03/18/18 CXR which revealed Stable large mediastinal mass. 2. Stable moderate-sized LEFT pleural effusion, a partially loculated hydropneumothorax. 3. Improved aeration in the LEFT LOWER LOBE since the examination 4 days ago. 4. No new abnormalities.  -Pt is symptomatically stable  and is not having overt SOB, will watch at this time, which pt is agreeable to -Discussed crowd avoidance strategies when the pt is discharged, and strictly avoiding individuals with infections as this can delay treatment   -Neulasta/Udenyca as outpatient on Friday 13th -Adding Senna S BID until bowel movements normalize -Will refer the pt to our social worker at the [facility] for cost of care considerations   -will need outpatient PET/CT ASAP -continue allopurinol 100mg  po BID for TLS prophylaxis until C2 of treatment -tolerated lovenox - undergoing teaching for self administration -will remove PICC line prior to discharge. Will need PICC line with admission for C2 od EPOCH. WIll arrange Rituxan as outpatient with next  cycle. -RTC with Dr Irene Limbo with labs in 1 week post discharge    Physical Exam at Discharge: BP 117/63 (BP Location: Left Arm)   Pulse 78   Temp 98 F (36.7 C) (Oral)   Resp 16   Ht 5' 5.98" (1.676 m)   Wt 116 lb 10 oz (52.9 kg)   SpO2 100%   BMI 18.83 kg/m  . GENERAL:alert, in no acute distress and comfortable SKIN: no acute rashes, no significant lesions EYES: conjunctiva are pink and non-injected, sclera anicteric OROPHARYNX: MMM, no exudates, no oropharyngeal erythema or ulceration NECK: supple, no JVD LYMPH:  no palpable lymphadenopathy in the cervical, axillary or inguinal regions LUNGS: clear to auscultation b/l with some decreased air entry left lung base HEART: regular rate & rhythm ABDOMEN:  normoactive bowel sounds , non tender, not distended. Extremity: no pedal edema PSYCH: alert & oriented x 3 with fluent speech NEURO: no focal motor/sensory deficits  Hospital Course:  Principal Problem:   Lymphoma of intrathoracic lymph nodes (HCC) Active Problems:   Mediastinal mass   Single subsegmental pulmonary embolism without acute cor pulmonale   Superior vena cava syndrome   S/P thoracentesis   Malnutrition of moderate degree   Counseling regarding advance care planning and goals of care   Acute deep vein thrombosis (DVT) of left upper extremity ([place])   Encounter for antineoplastic chemotherapy   Malignant pleural effusion   Diet:  Regular  Activity: Infection precautions as recommended   Condition at Discharge:   Stable  Signed: Dr. Sullivan Lone MD MS [phone]  TT spent discharging patient >2mins

## 2018-03-24 ENCOUNTER — Telehealth: Payer: Self-pay | Admitting: Hematology

## 2018-03-24 ENCOUNTER — Encounter: Payer: Self-pay | Admitting: Pharmacist

## 2018-03-24 NOTE — Telephone Encounter (Signed)
Scheduled appt per 12/16 sch message - pt mother is aware of appt date and time

## 2018-03-25 NOTE — Progress Notes (Signed)
HEMATOLOGY/ONCOLOGY CONSULTATION NOTE  Date of Service: 03/26/2018  Patient Care Team: Patient, No Pcp Per as PCP - General (General Practice)  CHIEF COMPLAINTS/PURPOSE OF CONSULTATION:  Newly diagnosed Primary mediastinal B cell Non hodgkins lymphoma  HISTORY OF PRESENTING ILLNESS:  Nicholas Moreno is a wonderful 22 y.o. 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 Patient notes that he started feeling unwell in August when he noted gradually increasing fatigue and weight loss.  Patient notes subsequently in September and October he developed new cough with progressive shortness of breath which triggered multiple visits with his primary care physician and was treated symptomatically and with inhalers.  Patient reports no imaging studies at the time.  Patient reports he has felt poorly during the last 2 to 3 months and during his recent visits to the [place] in [place]. He reports a total unintentional weight loss of close to 20 pounds. He also reports drenching night sweats over the last 4 to 6 weeks.  He reports that his cough and shortness of breath progressively got much more severe which led to him presenting to the emergency room yesterday. Notes nonproductive cough. No overt fevers.  Patient had a CTA of the chest on 03/07/2018 which showed  "Small acute pulmonary emboli identified within the distal left upper lobe lobar pulmonary artery. 2. 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 Primary differential considerations include lymphoma and leukemia as well as malignant derm cell  tumors. 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 Narrowing and displacement of the trachea with complete occlusion of the upper lobe bronchi. There is also marked narrowing of bilateral upper lobe pulmonary arteries. 3. Significantly diminished aeration to both upper lobes, left greater than right. 4. Moderate left pleural effusion."  Patient is currently on room air and saturating 99% with stable hemodynamics. He has been started on IV heparin due to his small pulmonary embolism. 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  No headaches.  No facial swelling.  No upper extremity swelling.  No overt clinical symptomatology of SVC syndrome despite radiographic findings of some SVC narrowing.  No overt chest pain at this time.  Interval History:   Nicholas Moreno returns today for management and evaluation of his Newly diagnosed Primary mediastinal B cell Non hodgkins lymphoma. I last saw the patient on 03/19/18 at his C1 EPOCH-R discharge. He is accompanied today by his mother and sister. The pt reports that he is doing well overall.  The pt reports that he has been eating more and has gained back a few pounds. He notes that his breathing has continued to improve and his arm swelling has completely resolved. He denies any abdominal pains, fevers, chills, night sweats, or concerns for infections. The pt notes that he is not able to lie flat on his back at night without feeling SOB.   The pt notes that he had some mild back pains on one day after  receiving Neulasta, and that this was not very bothersome.   Lab results today (03/26/18) of CBC and CMP is as follows: all values are WNL except for WBC at 10.9k, RBC at 4.07, HCT at 32.6, MCH at  25.6, nRBC at 3.1%, Glucose at 106, Calcium at 8.8, Total Protein at 6.4, Albumin at 3.1, ALT at 111, Alk Phos at 154. 03/26/18 Differential is pending 03/26/18 Uric acid is WNL at 4.2 03/26/18 LDH is pending 03/26/18 Phosphorous is pending   On review of systems, pt reports improved breathing, eating well, weight gain, resolved arm swelling, improved breathing, and denies fevers, chills, night sweats, concerns for infections, abdominal pains, leg swelling, and any other symptoms.   MEDICAL HISTORY:  No past medical history on file.  SURGICAL HISTORY: Past Surgical History:  Procedure Laterality Date  . MEDIASTINOSCOPY N/A 03/10/2018   Procedure: MEDIASTINOSCOPY;  Surgeon: Ivin Poot, MD;  Location: [facility];  Service: Thoracic;  Laterality: N/A;  . VIDEO ASSISTED THORACOSCOPY (VATS)/ LYMPH NODE SAMPLING Left 03/10/2018   Procedure: VIDEO ASSISTED THORACOSCOPY (VATS)/ BIOSPY ANTERIOR APPROACH;  Surgeon: Ivin Poot, MD;  Location: [facility];  Service: Thoracic;  Laterality: Left;  Marland Kitchen VIDEO BRONCHOSCOPY Left 03/10/2018   Procedure: VIDEO BRONCHOSCOPY;  Surgeon: Ivin Poot, MD;  Location: [facility] OR;  Service: Thoracic;  Laterality: Left;    SOCIAL HISTORY: Social History   Socioeconomic History  . Marital status: Single    Spouse name: Not on file  . Number of children: Not on file  . Years of education: Not on file  . Highest education level: Not on file  Occupational History  . Not on file  Social Needs  . Financial resource strain: Not on file  . Food insecurity:    Worry: Not on file    Inability: Not on file  . Transportation needs:    Medical: Not on file    Non-medical: Not on file  Tobacco Use  . Smoking status: Never Smoker  . Smokeless tobacco: Never Used  Substance and Sexual Activity  . Alcohol use: Not Currently  . Drug use: Not Currently  . Sexual activity: Not on file  Lifestyle  . Physical activity:    Days per week: Not on file    Minutes per  session: Not on file  . Stress: Not on file  Relationships  . Social connections:    Talks on phone: Not on file    Gets together: Not on file    Attends religious service: Not on file    Active member of club or organization: Not on file    Attends meetings of clubs or organizations: Not on file    Relationship status: Not on file  . Intimate partner violence:    Fear of current or ex partner: Not on file    Emotionally abused: Not on file    Physically abused: Not on file    Forced sexual activity: Not on file  Other Topics Concern  . Not on file  Social History Narrative  . Not on file    FAMILY HISTORY: No family history on file.  ALLERGIES:  has No Known Allergies.  MEDICATIONS:  Current Outpatient Medications  Medication Sig Dispense Refill  . albuterol (PROVENTIL HFA;VENTOLIN HFA) 108 (90 Base) MCG/ACT inhaler Inhale 2 puffs into the lungs every 4 (four) hours as needed for wheezing or shortness of breath.    . allopurinol (ZYLOPRIM) 100 MG tablet Take 1 tablet (100 mg total) by mouth  2 (two) times daily. 30 tablet 0  . enoxaparin (LOVENOX) 60 MG/0.6ML injection Inject 0.55 mLs (55 mg total) into the skin every 12 (twelve) hours. 60 Syringe 1  . fluticasone (FLOVENT HFA) 110 MCG/ACT inhaler Inhale 2 puffs into the lungs 2 (two) times daily.    . polyethylene glycol (MIRALAX / GLYCOLAX) packet Take 17 g by mouth daily. 14 each 0  . traMADol (ULTRAM) 50 MG tablet Take 1 tablet (50 mg total) by mouth every 6 (six) hours as needed (mild pain). 30 tablet 0   No current facility-administered medications for this visit.     REVIEW OF SYSTEMS:    A 10+ POINT REVIEW OF SYSTEMS WAS OBTAINED including neurology, dermatology, psychiatry, cardiac, respiratory, lymph, extremities, GI, GU, Musculoskeletal, constitutional, breasts, reproductive, HEENT.  All pertinent positives are noted in the HPI.  All others are negative.   PHYSICAL EXAMINATION: ECOG PERFORMANCE STATUS: 1 -  Symptomatic but completely ambulatory  . Vitals:   03/26/18 1353  BP: 112/68  Pulse: (!) 106  Resp: 18  Temp: 98.2 F (36.8 C)  SpO2: 100%   Filed Weights   03/26/18 1353  Weight: 118 lb 3.2 oz (53.6 kg)   .Body mass index is 19.09 kg/m.  GENERAL:alert, in no acute distress and comfortable SKIN: no acute rashes, no significant lesions EYES: conjunctiva are pink and non-injected, sclera anicteric OROPHARYNX: MMM, no exudates, no oropharyngeal erythema or ulceration NECK: supple, no JVD LYMPH:  no palpable lymphadenopathy in the cervical, axillary or inguinal regions LUNGS: clear to auscultation b/l with some decreased air entry left lung base HEART: regular rate & rhythm ABDOMEN:  normoactive bowel sounds , non tender, not distended. Extremity: no pedal edema PSYCH: alert & oriented x 3 with fluent speech NEURO: no focal motor/sensory deficits  LABORATORY DATA:  I have reviewed the data as listed  . CBC Latest Ref Rng & Units 03/26/2018 03/19/2018 03/18/2018  WBC 4.0 - 10.5 K/uL 10.9(H) 8.1 8.5  Hemoglobin 13.0 - 17.0 g/dL 10.4(L) 11.0(L) 11.1(L)  Hematocrit 39.0 - 52.0 % 32.6(L) 34.7(L) 36.1(L)  Platelets 150 - 400 K/uL 249 517(H) 523(H)    . CMP Latest Ref Rng & Units 03/26/2018 03/19/2018 03/18/2018  Glucose 70 - 99 mg/dL 106(H) 101(H) 98  BUN 6 - 20 mg/dL '13 13 15  '$ Creatinine 0.61 - 1.24 mg/dL 0.80 0.44(L) 0.54(L)  Sodium 135 - 145 mmol/L 137 141 140  Potassium 3.5 - 5.1 mmol/L 3.7 3.6 3.7  Chloride 98 - 111 mmol/L 101 102 103  CO2 22 - 32 mmol/L '26 29 25  '$ Calcium 8.9 - 10.3 mg/dL 8.8(L) 9.0 8.6(L)  Total Protein 6.5 - 8.1 g/dL 6.4(L) 5.7(L) 5.7(L)  Total Bilirubin 0.3 - 1.2 mg/dL 0.4 0.9 0.4  Alkaline Phos 38 - 126 U/L 154(H) 73 79  AST 15 - 41 U/L 36 24 27  ALT 0 - 44 U/L 111(H) 53(H) 56(H)   03/10/18 Biopsy:     03/10/18 Tissue Flow Cytometry:     RADIOGRAPHIC STUDIES: I have personally reviewed the radiological images as listed and agreed  with the findings in the report. Dg Chest 2 View  Result Date: 03/18/2018 CLINICAL DATA:  22 year old who presented on 03/07/2018 with a large mediastinal mass and small LEFT upper lobe pulmonary emboli. Patient also has a small LEFT pleural effusion for which she underwent prior thoracentesis. 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 EXAM: CHEST - 2 VIEW COMPARISON:  03/14/2018 and earlier, including CTA  chest 03/07/2018. FINDINGS: RIGHT arm PICC tip remains at or near the cavoatrial junction. Large mediastinal mass as noted previously. Stable moderate-sized LEFT pleural effusion, a partially loculated hydropneumothorax as there is an air-fluid level in the LATERAL pleural space. Passive atelectasis in the LEFT lung, with slight improved aeration in the LEFT LOWER LOBE since the examination 4 days ago. Stable very small RIGHT pleural effusion. No new pulmonary parenchymal abnormalities. IMPRESSION: 1. Stable large mediastinal mass. 2. Stable moderate-sized LEFT pleural effusion, a partially loculated hydropneumothorax. 3. Improved aeration in the LEFT LOWER LOBE since the examination 4 days ago. 4. No new abnormalities. Electronically Signed   By: Evangeline Dakin M.D.   On: 03/18/2018 13:28   Dg Chest 2 View  Result Date: 03/14/2018 CLINICAL DATA:  Shortness of breath EXAM: CHEST - 2 VIEW COMPARISON:  03/13/2018 FINDINGS: Right jugular central line is again seen. Left-sided apical pneumothorax is again noted and slightly improved when compared with the prior exam. Persistent mediastinal masses are noted stable from the prior exam. Persistent small left pleural effusion is noted as well as left basilar consolidation. IMPRESSION: Slight decrease in left-sided hydropneumothorax. Persistent mediastinal masses with evidence of left basilar consolidation. Electronically Signed   By: Inez Catalina M.D.   On: 03/14/2018 07:16   Dg Chest 2 View  Result Date:  03/13/2018 CLINICAL DATA:  Follow-up LEFT hydropneumothorax after chest tube removal. EXAM: CHEST - 2 VIEW COMPARISON:  03/12/2018 dating back to 03/08/2018. CT chest 03/07/2018. FINDINGS: RIGHT jugular central venous catheter tip projects over the LOWER SVC, unchanged stable LEFT apicolateral pneumothorax since yesterday, on the order of 15% or so. Loculated LEFT pleural effusion with air-fluid levels, unchanged. Massive mediastinal mass with compression of the LEFT lung and compression of the MEDIAL RIGHT UPPER LOBE as noted on the prior CT. Slight improved aeration in the LEFT UPPER LOBE since yesterday. No new abnormalities. IMPRESSION: 1. Stable LEFT apicolateral pneumothorax and loculated LEFT hydropneumothorax after chest tube removal. 2. Slight improved aeration in the LEFT UPPER LOBE since yesterday. 3. Otherwise, no change in the massive mediastinal mass with compressive atelectasis involving much of the LEFT lung and the MEDIAL RIGHT UPPER LOBE. 4. No new abnormalities. Electronically Signed   By: Evangeline Dakin M.D.   On: 03/13/2018 09:17   Ct Angio Chest Pe W And/or Wo Contrast  Result Date: 03/07/2018 CLINICAL DATA:  Shortness of breath and cough. EXAM: CT ANGIOGRAPHY CHEST WITH CONTRAST TECHNIQUE: Multidetector CT imaging of the chest was performed using the standard protocol during bolus administration of intravenous contrast. Multiplanar CT image reconstructions and MIPs were obtained to evaluate the vascular anatomy. CONTRAST:  179m ISOVUE-370 IOPAMIDOL (ISOVUE-370) INJECTION 76% COMPARISON:  None. FINDINGS: Cardiovascular: Normal heart size. The main pulmonary artery appears patent. Filling defect within the left upper lobe lobar pulmonary artery, image 104 through image 101 compatible with acute pulmonary emboli. Mediastinum/Nodes: A very large anterior mediastinal mass is identified which extends into both upper lung zones. This mass encases and narrows the superior vena cava as well as  the branch vessels off the aortic arch. There is extensive chest wall collaterals especially in the left supraclavicular region and paraspinal region. Encasement and narrowing of the left main pulmonary artery and bilateral upper lobe pulmonary arteries. Encasement and narrowing of the trachea is identified with rightward and posterior displacement. The left mainstem bronchus is narrowed, and bilateral upper lobe bronchi are occluded. Narrowing of the left lower lobe bronchi. Lungs/Pleura: There is a moderate left pleural  effusion. Approximately 90% opacification of the left upper lobe is identified secondary to tumor and postobstructive consolidation. A small amount of aerated lung within the apex. Compressive type atelectasis is noted within the left lung base. There is approximately 50% opacification of the right upper lobe with patchy areas of airspace consolidation and ground-glass attenuation within the remaining portions of the right upper lobe. The mass within the right upper lobe contains central areas of cavitation compatible with necrosis. Upper Abdomen: No acute findings. Musculoskeletal: No chest wall abnormality. No acute or significant osseous findings. Review of the MIP images confirms the above findings. IMPRESSION: 1. Small acute pulmonary emboli identified within the distal left upper lobe lobar pulmonary artery. 2. 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 Primary differential considerations include lymphoma and leukemia as well as malignant derm cell tumors. 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 Narrowing and displacement of the trachea with complete occlusion of the upper lobe bronchi. There is also marked narrowing of bilateral upper lobe pulmonary arteries. 3. Significantly diminished  aeration to both upper lobes, left greater than right. 4. Moderate left pleural effusion. Critical Value/emergent results were called by telephone at the time of interpretation on 03/07/2018 at 5:29 pm to Dr. Carlisle Cater , who verbally acknowledged these results. Electronically Signed   By: Kerby Moors M.D.   On: 03/07/2018 17:30   Dg Chest Bilateral Decubitus  Result Date: 03/18/2018 CLINICAL DATA:  Large mediastinal mass and moderate-sized LEFT pleural effusion (partially loculated hydropneumothorax) and small RIGHT pleural effusion. EXAM: CHEST - BILATERAL DECUBITUS VIEW COMPARISON:  Chest x-rays earlier same day and previously. FINDINGS: Very small freely layering RIGHT pleural effusion. Partially loculated LEFT hydropneumothorax, though there is a freely layering component to the LEFT pleural effusion. IMPRESSION: 1. Partially loculated LEFT hydropneumothorax though there is a freely layering component to the LEFT pleural effusion. 2. Freely layering very small RIGHT pleural effusion. Electronically Signed   By: Evangeline Dakin M.D.   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 Status post mediastinoscopy and VATS. EXAM: CT ABDOMEN AND PELVIS WITH CONTRAST TECHNIQUE: Multidetector CT imaging of the abdomen and pelvis was performed using the standard protocol following bolus administration of intravenous contrast. CONTRAST:  154m OMNIPAQUE IOHEXOL 300 MG/ML  SOLN COMPARISON:  Recent chest CT 03/07/2018 FINDINGS: Lower chest: Persistent left pleural effusion containing air from recent surgery. There is also air in the subcutaneous tissues of the anterior chest wall. Left lower lobe atelectasis. The heart is normal in size. The distal esophagus is grossly normal. Small right pleural effusion. The right lung base is grossly clear. Hepatobiliary: No focal hepatic lesions or intrahepatic biliary  dilatation. The gallbladder is normal. No common bile duct dilatation. Pancreas: No mass, inflammation or ductal dilatation. Spleen: Normal size.  No focal lesions. Adrenals/Urinary Tract: Adrenal glands are unremarkable. Both kidneys demonstrate patchy peripheral perfusion defects typically seen with pyelonephritis. Recommend correlation with urinalysis. Stomach/Bowel: The stomach, duodenum, small bowel and colon are grossly normal. No acute inflammatory changes, mass lesions or obstructive findings. Vascular/Lymphatic: No mesenteric or retroperitoneal mass or adenopathy. The aorta and branch vessels are normal. The major venous structures are patent. Reproductive: The prostate gland and seminal vesicles are unremarkable. 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 Musculoskeletal: No significant bony findings. No worrisome bone lesions. IMPRESSION: 1. Bilateral pleural effusions, left greater than right with left lower lobe atelectasis. There is also some pleural air and subcutaneous air on the left side consistent with recent surgery. 2. No lymphadenopathy in the abdomen or pelvis. 3. Patchy perfusion abnormalities involving both kidneys most typically seen with pyelonephritis. Recommend correlation with urinalysis. 4. Body wall edema, mesenteric edema and free pelvic fluid as above. Electronically Signed   By: Marijo Sanes M.D.   On: 03/13/2018 19:32   Dg Chest Port 1 View  Result Date: 03/12/2018 CLINICAL DATA:  22 year old male with mediastinal mass, status post VATS and biopsy with mediastinoscopy 05/12/2017, suspected lymphoma. EXAM: PORTABLE CHEST 1 VIEW COMPARISON:  Chest x-ray 03/11/2018, 03/10/2018, 03/08/2018, CT 03/07/2018 FINDINGS: Cardiomediastinal silhouette partially obscured by lung/pleural disease/mediastinal disease, unchanged. In the interval there  has been development of left apical pneumothorax component. Opacity at the left base obscuring the left costophrenic angle in the left heart border. Unchanged left thoracostomy tube. Right IJ central venous catheter, unchanged. No right-sided confluent airspace disease. No pleural effusion or pneumothorax. IMPRESSION: Interval development of left apical pneumothorax, either ex vacuo, or representing air leak. Unchanged position of thoracostomy tube. Unchanged right IJ central venous catheter. Similar appearance of cardiomediastinal silhouette, with obscuration of the heart borders and predominantly left lung by known mediastinal mass and left lung consolidation, status post VATS and mediastinoscopy. These results were called by telephone at the time of interpretation on 03/12/2018 at 10:06 am to nurse caring for patient, Ms Lilia Pro in [facility]. Electronically Signed   By: Corrie Mckusick D.O.   On: 03/12/2018 10:07   Dg Chest Port 1 View  Result Date: 03/11/2018 CLINICAL DATA:  Postop chest biopsy.  Chest tube. EXAM: PORTABLE CHEST 1 VIEW COMPARISON:  03/10/2018 FINDINGS: Near complete opacification of the left chest again noted due to large mediastinal mass and collapse of the left lung. Left pleural effusion is present. Left chest tube remains in place and unchanged. No pneumothorax. Mediastinal mass is present crossing the midline to the right unchanged. Remainder of the right lung is normally aerated Right jugular central venous catheter tip in the mid SVC unchanged. No pneumothorax on the right. IMPRESSION: Large mediastinal mass unchanged. Compression of the left lung with left effusion unchanged. Left chest tube in place No pneumothorax. Electronically Signed   By: Franchot Gallo M.D.   On: 03/11/2018 08:53   Dg Chest Port 1 View  Result Date: 03/10/2018 CLINICAL DATA:  Post left VATS.  Evaluate pneumothorax.  Chest tube. EXAM: PORTABLE CHEST 1 VIEW COMPARISON:  03/08/2018 FINDINGS: Interval placement of  left chest tube. Continued large left pleural effusion and diffuse airspace disease throughout the left lung. No pneumothorax. Large rounded masslike opacity in the medial right upper lobe is stable. Right central line is in place with the tip in the SVC. No pneumothorax. IMPRESSION: Interval placement of left chest tube and right central line. No pneumothorax. Continued large left pleural effusion and near complete opacification of the left lung. Continued medial right upper lobe mass. Electronically Signed   By: Rolm Baptise M.D.   On: 03/10/2018 20:53   Dg Chest Port 1 View  Result Date: 03/08/2018 CLINICAL DATA:  Status post left thoracentesis EXAM: PORTABLE CHEST 1 VIEW COMPARISON:  Chest CT from 1 day prior  FINDINGS: Near complete opacification of the left hemithorax with asymmetric volume loss in the left hemithorax, not substantially changed. Marked thickening of the upper mediastinum bilaterally, unchanged. Cardiac silhouette is obscured, with the heart appearing normal in size. No pneumothorax. No right pleural effusion. Persistent moderate left pleural effusion. Stable masslike opacity in the medial upper right lung with right upper lobe volume loss. IMPRESSION: 1. No pneumothorax.  Persistent moderate left pleural effusion. 2. Stable marked thickening of the upper mediastinum bilaterally compatible with known infiltrative anterior mediastinal mass. 3. Persistent volume loss in the left hemithorax and right upper lung. Stable near complete left lung opacification and masslike medial right upper lung opacity compatible with a combination of atelectasis and tumor. Electronically Signed   By: Ilona Sorrel M.D.   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 Comparison Study: No prior study on file for  comparison Performing Technologist: Sharion Dove RVS  Examination Guidelines: A complete evaluation includes B-mode imaging, spectral Doppler, color Doppler, and power Doppler as needed of all accessible portions of each vessel. Bilateral testing is considered an integral part of a complete examination. Limited examinations for reoccurring indications may be performed as noted.  Right Venous Findings: +---------+---------------+---------+-----------+----------+-------+          CompressibilityPhasicitySpontaneityPropertiesSummary +---------+---------------+---------+-----------+----------+-------+ CFV      Full           Yes      Yes                          +---------+---------------+---------+-----------+----------+-------+ SFJ      Full                                                 +---------+---------------+---------+-----------+----------+-------+ FV Prox  Full                                                 +---------+---------------+---------+-----------+----------+-------+ FV Mid   Full                                                 +---------+---------------+---------+-----------+----------+-------+ FV DistalFull                                                 +---------+---------------+---------+-----------+----------+-------+ PFV      Full                                                 +---------+---------------+---------+-----------+----------+-------+ POP      Full           Yes      Yes                          +---------+---------------+---------+-----------+----------+-------+  PTV      Full                                                 +---------+---------------+---------+-----------+----------+-------+ PERO     Full                                                 +---------+---------------+---------+-----------+----------+-------+  Left Venous Findings: +---------+---------------+---------+-----------+----------+-------+           CompressibilityPhasicitySpontaneityPropertiesSummary +---------+---------------+---------+-----------+----------+-------+ CFV      Full           Yes      Yes                          +---------+---------------+---------+-----------+----------+-------+ SFJ      Full                                                 +---------+---------------+---------+-----------+----------+-------+ FV Prox  Full                                                 +---------+---------------+---------+-----------+----------+-------+ FV Mid   Full                                                 +---------+---------------+---------+-----------+----------+-------+ FV DistalFull                                                 +---------+---------------+---------+-----------+----------+-------+ PFV      Full                                                 +---------+---------------+---------+-----------+----------+-------+ POP      Full           Yes      Yes                          +---------+---------------+---------+-----------+----------+-------+ PTV      Full                                                 +---------+---------------+---------+-----------+----------+-------+ PERO     Full                                                 +---------+---------------+---------+-----------+----------+-------+  Summary: Right: There is no evidence of deep vein thrombosis in the lower extremity. Sluggish flow noted throughout Left: There is no evidence of deep vein thrombosis in the lower extremity. Sluggish flow noted throughout  *See table(s) above for measurements and observations. Electronically signed by Servando Snare MD on 03/09/2018 at 8:52:35 PM.    Final    Vas [place] Upper Extremity Venous Duplex  Result Date: 03/12/2018 UPPER VENOUS STUDY  Indications: Edema, and SVC syndrome, chest mass Performing Technologist: Landry Mellow RDMS, RVT  Examination Guidelines: A  complete evaluation includes B-mode imaging, spectral Doppler, color Doppler, and power Doppler as needed of all accessible portions of each vessel. Bilateral testing is considered an integral part of a complete examination. Limited examinations for reoccurring indications may be performed as noted.  Right Findings: +----------+------------+----------+---------+-----------+---------------------+ RIGHT     CompressiblePropertiesPhasicitySpontaneous       Summary        +----------+------------+----------+---------+-----------+---------------------+ IJV                                                  unable to image due                                                           to IJ line       +----------+------------+----------+---------+-----------+---------------------+ Subclavian    Full                 Yes       Yes                          +----------+------------+----------+---------+-----------+---------------------+ Axillary      Full                 Yes       Yes                          +----------+------------+----------+---------+-----------+---------------------+ Brachial      Full                 Yes       Yes                          +----------+------------+----------+---------+-----------+---------------------+ Radial        Full                 Yes       Yes                          +----------+------------+----------+---------+-----------+---------------------+ Ulnar         Full                                                        +----------+------------+----------+---------+-----------+---------------------+ Cephalic      Full                                                        +----------+------------+----------+---------+-----------+---------------------+  Basilic       Full                                                        +----------+------------+----------+---------+-----------+---------------------+  Left  Findings: +----------+------------+----------+---------+-----------+-------+ LEFT      CompressiblePropertiesPhasicitySpontaneousSummary +----------+------------+----------+---------+-----------+-------+ IJV         Partial                Yes       Yes     Acute  +----------+------------+----------+---------+-----------+-------+ Subclavian  Partial                Yes       Yes     Acute  +----------+------------+----------+---------+-----------+-------+ Axillary      Full                 Yes       Yes            +----------+------------+----------+---------+-----------+-------+ Brachial      Full                 Yes       Yes            +----------+------------+----------+---------+-----------+-------+ Radial        Full                                          +----------+------------+----------+---------+-----------+-------+ Ulnar         Full                                          +----------+------------+----------+---------+-----------+-------+ Cephalic      Full                                          +----------+------------+----------+---------+-----------+-------+ Basilic       Full                                          +----------+------------+----------+---------+-----------+-------+  Summary:  Right: No evidence of deep vein thrombosis in the upper extremity. No evidence of superficial vein thrombosis in the upper extremity.  Left: No evidence of superficial vein thrombosis in the upper extremity. Findings consistent with acute deep vein thrombosis involving the left internal jugular veins and left subclavian veins.  *See table(s) above for measurements and observations.  Diagnosing physician: Quay Burow MD Electronically signed by Quay Burow MD on 03/12/2018 at 4:53:40 PM.    Final    [place] Ekg Site Rite  Result Date: 03/14/2018 If Site Rite image not attached, placement could not be confirmed due to current cardiac  rhythm.   ASSESSMENT & PLAN:  22 y.o. 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 #4 left-sided pleural effusion and left-sided lung atelectasis due to airway compression from mediastinal mass - improved on CXR yesterday #5 significant weight loss of 20 pounds likely due to lymphoma - resolving with treatment. #6 drenching night sweats likely has constitutional symptoms from his PMBCL - improved/resolved with steroids. #7 Fever - due to DVT vs post-obstructive pneumonia vs due to lymphoma/pleuritis   PLAN: -Discussed pt labwork today, 03/26/18; WBC at 10.9k in setting of G-CSF support, HGB slightly lower at 10.4, PLT normalized to 249k. Uric acid is normal at 4.2 -Pt tolerated C1 very well overall, is already clinically improved, and has no prohibitive toxicities from treatment. Patient was treated urgently with C1 R+EPOCH-R  -Proceed with initial staging PET/CT scheduled for 04/03/18 -Next planned admission for C2 EPOCH-R will be on 04/07/18 -04/14/17 Neulasta and Rituxan  -Discussed that if the patient continues tolerating treatment well, we will consider dose escalation for future cycles  -Discussed crowd avoidance strategies when the pt is discharged, and strictly avoiding individuals with infections as this can delay treatment  -Adding Senna S BID until bowel movements normalize -Did refer the pt to our social worker at the [facility] for cost of care considerations  -continue allopurinol '100mg'$  po BID for TLS prophylaxis until C2 of  treatment -Continue Lovenox  -Will need PICC line with admission for C2 of [facility]   Inpatient admission for 2nd cycle of EPOCH-R on 04/07/2018 for 5 days Outpatient Neulasta and Rituxan on 04/14/2018 RTC with Dr Irene Limbo with labs 04/21/2018   All of the patients questions were answered with apparent satisfaction. The patient knows to call the clinic with any problems, questions or concerns.  The total time spent in the appt was 30 minutes and more than 50% was on counseling and direct patient cares.    Sullivan Lone MD MS AAHIVMS [facility] Hematology/Oncology Physician [facility]  (Office):       [phone] (Work cell):  [phone] (Fax):           [phone]  03/26/2018 2:25 PM  I, Baldwin Jamaica, am acting as a scribe for Dr. Sullivan Lone.   .I have reviewed the above documentation for accuracy and completeness, and I agree with the above. Brunetta Genera MD

## 2018-03-26 ENCOUNTER — Encounter (HOSPITAL_COMMUNITY): Payer: Self-pay | Admitting: Hematology

## 2018-03-26 ENCOUNTER — Inpatient Hospital Stay: Payer: BLUE CROSS/BLUE SHIELD

## 2018-03-26 ENCOUNTER — Telehealth: Payer: Self-pay

## 2018-03-26 ENCOUNTER — Inpatient Hospital Stay (HOSPITAL_BASED_OUTPATIENT_CLINIC_OR_DEPARTMENT_OTHER): Payer: BLUE CROSS/BLUE SHIELD | Admitting: Hematology

## 2018-03-26 ENCOUNTER — Telehealth: Payer: Self-pay | Admitting: *Deleted

## 2018-03-26 VITALS — BP 112/68 | HR 106 | Temp 98.2°F | Resp 18 | Ht 65.98 in | Wt 118.2 lb

## 2018-03-26 DIAGNOSIS — C8520 Mediastinal (thymic) large B-cell lymphoma, unspecified site: Secondary | ICD-10-CM

## 2018-03-26 DIAGNOSIS — Z79899 Other long term (current) drug therapy: Secondary | ICD-10-CM

## 2018-03-26 DIAGNOSIS — I2699 Other pulmonary embolism without acute cor pulmonale: Secondary | ICD-10-CM

## 2018-03-26 DIAGNOSIS — C8528 Mediastinal (thymic) large B-cell lymphoma, lymph nodes of multiple sites: Secondary | ICD-10-CM

## 2018-03-26 DIAGNOSIS — Z7901 Long term (current) use of anticoagulants: Secondary | ICD-10-CM | POA: Diagnosis not present

## 2018-03-26 DIAGNOSIS — I82C12 Acute embolism and thrombosis of left internal jugular vein: Secondary | ICD-10-CM

## 2018-03-26 LAB — CMP (CANCER CENTER ONLY)
ALT: 111 U/L — ABNORMAL HIGH (ref 0–44)
AST: 36 U/L (ref 15–41)
Albumin: 3.1 g/dL — ABNORMAL LOW (ref 3.5–5.0)
Alkaline Phosphatase: 154 U/L — ABNORMAL HIGH (ref 38–126)
Anion gap: 10 (ref 5–15)
BUN: 13 mg/dL (ref 6–20)
CALCIUM: 8.8 mg/dL — AB (ref 8.9–10.3)
CO2: 26 mmol/L (ref 22–32)
Chloride: 101 mmol/L (ref 98–111)
Creatinine: 0.8 mg/dL (ref 0.61–1.24)
GFR, Est AFR Am: >60 mL/min (ref >60)
GFR, Estimated: >60 mL/min (ref >60)
Glucose, Bld: 106 mg/dL — ABNORMAL HIGH (ref 70–99)
Potassium: 3.7 mmol/L (ref 3.5–5.1)
Sodium: 137 mmol/L (ref 135–145)
Total Bilirubin: 0.4 mg/dL (ref 0.3–1.2)
Total Protein: 6.4 g/dL — ABNORMAL LOW (ref 6.5–8.1)

## 2018-03-26 LAB — PHOSPHORUS: PHOSPHORUS: 4.2 mg/dL (ref 2.5–4.6)

## 2018-03-26 LAB — CBC WITH DIFFERENTIAL (CANCER CENTER ONLY)
Abs Immature Granulocytes: 1.54 10*3/uL — ABNORMAL HIGH (ref 0.00–0.07)
BASOS ABS: 0.1 10*3/uL (ref 0.0–0.1)
Basophils Relative: 1 %
EOS PCT: 2 %
Eosinophils Absolute: 0.2 10*3/uL (ref 0.0–0.5)
HCT: 32.6 % — ABNORMAL LOW (ref 39.0–52.0)
Hemoglobin: 10.4 g/dL — ABNORMAL LOW (ref 13.0–17.0)
Immature Granulocytes: 14 %
Lymphocytes Relative: 7 %
Lymphs Abs: 0.7 10*3/uL (ref 0.7–4.0)
MCH: 25.6 pg — ABNORMAL LOW (ref 26.0–34.0)
MCHC: 31.9 g/dL (ref 30.0–36.0)
MCV: 80.1 fL (ref 80.0–100.0)
Monocytes Absolute: 1.6 10*3/uL — ABNORMAL HIGH (ref 0.1–1.0)
Monocytes Relative: 14 %
NRBC: 3.1 % — AB (ref 0.0–0.2)
Neutro Abs: 6.8 10*3/uL (ref 1.7–7.7)
Neutrophils Relative %: 62 %
Platelet Count: 249 10*3/uL (ref 150–400)
RBC: 4.07 MIL/uL — AB (ref 4.22–5.81)
RDW: 14.1 % (ref 11.5–15.5)
WBC Count: 10.9 10*3/uL — ABNORMAL HIGH (ref 4.0–10.5)

## 2018-03-26 LAB — LACTATE DEHYDROGENASE: LDH: 360 U/L — ABNORMAL HIGH (ref 98–192)

## 2018-03-26 LAB — URIC ACID: Uric Acid, Serum: 4.2 mg/dL (ref 3.7–8.6)

## 2018-03-26 NOTE — Progress Notes (Signed)
FMLA for mother, Comer Devins, successfully faxed to [employer] at [employer] at [phone]. Mailed copy to patient address on file.

## 2018-03-26 NOTE — Telephone Encounter (Signed)
Contacted patient placement. Spoke with [facility] Arranged for admission starting 04/07/18 for 5 days [facility]- R with Dr. Benay Spice as admitting MD, per Dr. Irene Limbo.

## 2018-03-26 NOTE — Telephone Encounter (Signed)
Printed avs and calender of [place] appointment. Per 12/18 los

## 2018-03-27 ENCOUNTER — Encounter (HOSPITAL_COMMUNITY): Payer: Self-pay | Admitting: Cardiothoracic Surgery

## 2018-04-01 ENCOUNTER — Encounter (HOSPITAL_COMMUNITY): Payer: Self-pay | Admitting: Hematology

## 2018-04-01 LAB — CULTURE, FUNGUS WITHOUT SMEAR

## 2018-04-03 ENCOUNTER — Encounter (HOSPITAL_COMMUNITY)
Admission: RE | Admit: 2018-04-03 | Discharge: 2018-04-03 | Disposition: A | Discharge status: Home / Self Care | Payer: BLUE CROSS/BLUE SHIELD | Source: Ambulatory Visit | Attending: Hematology | Admitting: Hematology

## 2018-04-03 ENCOUNTER — Telehealth: Payer: Self-pay | Admitting: *Deleted

## 2018-04-03 DIAGNOSIS — C8528 Mediastinal (thymic) large B-cell lymphoma, lymph nodes of multiple sites: Secondary | ICD-10-CM | POA: Insufficient documentation

## 2018-04-03 LAB — GLUCOSE, CAPILLARY: Glucose-Capillary: 82 mg/dL (ref 70–99)

## 2018-04-03 MED ORDER — FLUDEOXYGLUCOSE F - 18 (FDG) INJECTION
6.4000 | Freq: Once | INTRAVENOUS | Status: AC
  Administered 2018-04-03: 6.4 via INTRAVENOUS

## 2018-04-03 NOTE — Telephone Encounter (Signed)
Contacted [facility] in Bed Placement. Per Dr. Irene Limbo arranged AM admission for 04/10/18 for [facility]. They will contact patient when bed is ready. Sent email to #inpatient chemotherapy.

## 2018-04-07 ENCOUNTER — Telehealth: Payer: Self-pay | Admitting: *Deleted

## 2018-04-07 ENCOUNTER — Telehealth: Payer: Self-pay | Admitting: Hematology

## 2018-04-07 ENCOUNTER — Inpatient Hospital Stay: Admission: RE | Admit: 2018-04-07 | Payer: BLUE CROSS/BLUE SHIELD | Source: Ambulatory Visit | Admitting: Oncology

## 2018-04-07 NOTE — Telephone Encounter (Signed)
R/s appt per 12/26 sch message - left message for patient with appt date and time r/s from 1/6 to 1/8

## 2018-04-07 NOTE — Telephone Encounter (Signed)
Mother called - Waiting on call about admission for today (12/30). Thought son was supposed to have chemo today. Verified son is to be admitted 1/2 per Dr. Grier Mitts most recent directions as MD out of town. Mother wanted to know if all medications are to remain the same until admission. Advised that per Dr. Grier Mitts instructions continue all medications at this time. Verbalized understanding.

## 2018-04-10 ENCOUNTER — Inpatient Hospital Stay (HOSPITAL_COMMUNITY)
Admission: AD | Admit: 2018-04-10 | Discharge: 2018-04-14 | DRG: 847 | Disposition: A | Discharge status: Home / Self Care | Payer: BLUE CROSS/BLUE SHIELD | Attending: Hematology | Admitting: Hematology

## 2018-04-10 ENCOUNTER — Encounter (HOSPITAL_COMMUNITY): Payer: Self-pay

## 2018-04-10 ENCOUNTER — Other Ambulatory Visit: Payer: Self-pay

## 2018-04-10 ENCOUNTER — Ambulatory Visit: Payer: Self-pay | Admitting: Cardiothoracic Surgery

## 2018-04-10 ENCOUNTER — Inpatient Hospital Stay: Payer: Self-pay

## 2018-04-10 DIAGNOSIS — C8522 Mediastinal (thymic) large B-cell lymphoma, intrathoracic lymph nodes: Secondary | ICD-10-CM | POA: Diagnosis not present

## 2018-04-10 DIAGNOSIS — C8330 Diffuse large B-cell lymphoma, unspecified site: Secondary | ICD-10-CM | POA: Diagnosis present

## 2018-04-10 DIAGNOSIS — C8580 Other specified types of non-Hodgkin lymphoma, unspecified site: Secondary | ICD-10-CM | POA: Diagnosis present

## 2018-04-10 DIAGNOSIS — X58XXXA Exposure to other specified factors, initial encounter: Secondary | ICD-10-CM | POA: Diagnosis not present

## 2018-04-10 DIAGNOSIS — J910 Malignant pleural effusion: Secondary | ICD-10-CM | POA: Diagnosis present

## 2018-04-10 DIAGNOSIS — I82622 Acute embolism and thrombosis of deep veins of left upper extremity: Secondary | ICD-10-CM | POA: Diagnosis present

## 2018-04-10 DIAGNOSIS — R61 Generalized hyperhidrosis: Secondary | ICD-10-CM | POA: Diagnosis present

## 2018-04-10 DIAGNOSIS — J9859 Other diseases of mediastinum, not elsewhere classified: Secondary | ICD-10-CM

## 2018-04-10 DIAGNOSIS — I871 Compression of vein: Secondary | ICD-10-CM | POA: Diagnosis present

## 2018-04-10 DIAGNOSIS — K5900 Constipation, unspecified: Secondary | ICD-10-CM | POA: Diagnosis present

## 2018-04-10 DIAGNOSIS — Z681 Body mass index (BMI) 19 or less, adult: Secondary | ICD-10-CM | POA: Diagnosis not present

## 2018-04-10 DIAGNOSIS — Z7189 Other specified counseling: Secondary | ICD-10-CM

## 2018-04-10 DIAGNOSIS — E876 Hypokalemia: Secondary | ICD-10-CM

## 2018-04-10 DIAGNOSIS — Z86711 Personal history of pulmonary embolism: Secondary | ICD-10-CM | POA: Diagnosis not present

## 2018-04-10 DIAGNOSIS — Z5111 Encounter for antineoplastic chemotherapy: Secondary | ICD-10-CM | POA: Diagnosis not present

## 2018-04-10 DIAGNOSIS — T380X5A Adverse effect of glucocorticoids and synthetic analogues, initial encounter: Secondary | ICD-10-CM | POA: Diagnosis not present

## 2018-04-10 DIAGNOSIS — C8528 Mediastinal (thymic) large B-cell lymphoma, lymph nodes of multiple sites: Secondary | ICD-10-CM | POA: Diagnosis not present

## 2018-04-10 DIAGNOSIS — R634 Abnormal weight loss: Secondary | ICD-10-CM | POA: Diagnosis present

## 2018-04-10 DIAGNOSIS — J9811 Atelectasis: Secondary | ICD-10-CM | POA: Diagnosis present

## 2018-04-10 LAB — COMPREHENSIVE METABOLIC PANEL
ALT: 28 U/L (ref 0–44)
AST: 26 U/L (ref 15–41)
Albumin: 4.4 g/dL (ref 3.5–5.0)
Alkaline Phosphatase: 69 U/L (ref 38–126)
Anion gap: 10 (ref 5–15)
BUN: 10 mg/dL (ref 6–20)
CALCIUM: 9.2 mg/dL (ref 8.9–10.3)
CO2: 26 mmol/L (ref 22–32)
Chloride: 105 mmol/L (ref 98–111)
Creatinine, Ser: 0.67 mg/dL (ref 0.61–1.24)
GFR calc Af Amer: >60 mL/min (ref >60)
GFR calc non Af Amer: >60 mL/min (ref >60)
Glucose, Bld: 101 mg/dL — ABNORMAL HIGH (ref 70–99)
Potassium: 3.5 mmol/L (ref 3.5–5.1)
Sodium: 141 mmol/L (ref 135–145)
TOTAL PROTEIN: 7.4 g/dL (ref 6.5–8.1)
Total Bilirubin: 0.7 mg/dL (ref 0.3–1.2)

## 2018-04-10 LAB — CBC WITH DIFFERENTIAL/PLATELET
Abs Immature Granulocytes: 0.02 10*3/uL (ref 0.00–0.07)
Basophils Absolute: 0.2 10*3/uL — ABNORMAL HIGH (ref 0.0–0.1)
Basophils Relative: 3 %
Eosinophils Absolute: 0.1 10*3/uL (ref 0.0–0.5)
Eosinophils Relative: 1 %
HCT: 37.6 % — ABNORMAL LOW (ref 39.0–52.0)
Hemoglobin: 11.7 g/dL — ABNORMAL LOW (ref 13.0–17.0)
IMMATURE GRANULOCYTES: 0 %
Lymphocytes Relative: 18 %
Lymphs Abs: 1 10*3/uL (ref 0.7–4.0)
MCH: 26.2 pg (ref 26.0–34.0)
MCHC: 31.1 g/dL (ref 30.0–36.0)
MCV: 84.1 fL (ref 80.0–100.0)
MONOS PCT: 10 %
Monocytes Absolute: 0.6 10*3/uL (ref 0.1–1.0)
Neutro Abs: 3.9 10*3/uL (ref 1.7–7.7)
Neutrophils Relative %: 68 %
PLATELETS: 613 10*3/uL — AB (ref 150–400)
RBC: 4.47 MIL/uL (ref 4.22–5.81)
RDW: 18 % — AB (ref 11.5–15.5)
WBC: 5.6 10*3/uL (ref 4.0–10.5)
nRBC: 0 % (ref 0.0–0.2)

## 2018-04-10 LAB — RETICULOCYTES
Immature Retic Fract: 16.7 % — ABNORMAL HIGH (ref 2.3–15.9)
RBC.: 4.47 MIL/uL (ref 4.22–5.81)
RETIC COUNT ABSOLUTE: 143 10*3/uL (ref 19.0–186.0)
Retic Ct Pct: 3.2 % — ABNORMAL HIGH (ref 0.4–3.1)

## 2018-04-10 MED ORDER — ACETAMINOPHEN 325 MG PO TABS: 650.0000 mg | ORAL_TABLET | ORAL | Status: DC | PRN

## 2018-04-10 MED ORDER — PREDNISONE 20 MG PO TABS
60.0000 mg | ORAL_TABLET | Freq: Every day | ORAL | Status: AC
  Administered 2018-04-10 – 2018-04-14 (×5): 60 mg via ORAL
  Filled 2018-04-10 (×5): qty 3

## 2018-04-10 MED ORDER — HOT PACK MISC ONCOLOGY
1.0000 | Freq: Once | Status: DC | PRN
  Filled 2018-04-10: qty 1

## 2018-04-10 MED ORDER — COLD PACK MISC ONCOLOGY
1.0000 | Freq: Once | Status: DC | PRN
  Filled 2018-04-10: qty 1

## 2018-04-10 MED ORDER — VINCRISTINE SULFATE CHEMO INJECTION 1 MG/ML
Freq: Once | INTRAVENOUS | Status: AC
  Administered 2018-04-10: 18:00:00 via INTRAVENOUS
  Filled 2018-04-10: qty 8

## 2018-04-10 MED ORDER — SODIUM CHLORIDE 0.9 % IV SOLN
Freq: Once | INTRAVENOUS | Status: AC
  Administered 2018-04-10: 18 mg via INTRAVENOUS
  Filled 2018-04-10: qty 4

## 2018-04-10 MED ORDER — ALBUTEROL SULFATE HFA 108 (90 BASE) MCG/ACT IN AERS: 2.0000 | INHALATION_SPRAY | RESPIRATORY_TRACT | Status: DC | PRN

## 2018-04-10 MED ORDER — SODIUM CHLORIDE 0.9% FLUSH
10.0000 mL | Freq: Two times a day (BID) | INTRAVENOUS | Status: DC
  Administered 2018-04-10 – 2018-04-14 (×8): 10 mL

## 2018-04-10 MED ORDER — ENOXAPARIN SODIUM 60 MG/0.6ML ~~LOC~~ SOLN
55.0000 mg | Freq: Two times a day (BID) | SUBCUTANEOUS | Status: DC
  Administered 2018-04-10 – 2018-04-14 (×8): 55 mg via SUBCUTANEOUS
  Filled 2018-04-10 (×8): qty 0.6

## 2018-04-10 MED ORDER — TRAMADOL HCL 50 MG PO TABS: 50.0000 mg | ORAL_TABLET | Freq: Four times a day (QID) | ORAL | Status: DC | PRN

## 2018-04-10 MED ORDER — SODIUM CHLORIDE 0.9 % IV SOLN
INTRAVENOUS | Status: DC
  Administered 2018-04-10 – 2018-04-12 (×2): via INTRAVENOUS

## 2018-04-10 MED ORDER — ALBUTEROL SULFATE (2.5 MG/3ML) 0.083% IN NEBU: 2.5000 mg | INHALATION_SOLUTION | RESPIRATORY_TRACT | Status: DC | PRN

## 2018-04-10 MED ORDER — SODIUM CHLORIDE 0.9% FLUSH: 10.0000 mL | INTRAVENOUS | Status: DC | PRN

## 2018-04-10 MED ORDER — SENNOSIDES-DOCUSATE SODIUM 8.6-50 MG PO TABS: 1.0000 | ORAL_TABLET | Freq: Every evening | ORAL | Status: DC | PRN

## 2018-04-10 NOTE — Progress Notes (Signed)
Confirmed w/ Dr. Irene Limbo, keep cycle 2 doses for doxorubicin, etoposide, and cyclophosphamide the same as cycle 1. May consider adjusting based on tolerability this cycle.   Nicholas Moreno, PharmD, [place] [facility] Pharmacist Pharmacy Phone: [phone] 04/10/2018

## 2018-04-10 NOTE — Progress Notes (Signed)
Peripherally Inserted Central Catheter/Midline Placement  The IV Nurse has discussed with the patient and/or persons authorized to consent for the patient, the purpose of this procedure and the potential benefits and risks involved with this procedure.  The benefits include less needle sticks, lab draws from the catheter, and the patient may be discharged home with the catheter. Risks include, but not limited to, infection, bleeding, blood clot (thrombus formation), and puncture of an artery; nerve damage and irregular heartbeat and possibility to perform a PICC exchange if needed/ordered by physician.  Alternatives to this procedure were also discussed.  B[employer] PICC patient education guide, fact sheet on infection prevention and patient information card has been provided to patient /or left at bedside.    PICC/Midline Placement Documentation  PICC Double Lumen 04/10/18 PICC Right Brachial 40 cm 0 cm (Active)  Indication for Insertion or Continuance of Line Limited venous access - need for IV therapy >5 days (PICC only) 04/10/2018  1:38 PM  Exposed Catheter (cm) 0 cm 04/10/2018  1:38 PM  Site Assessment Clean;Dry;Intact 04/10/2018  1:38 PM  Lumen #1 Status Flushed;Blood return noted 04/10/2018  1:38 PM  Lumen #2 Status Flushed;Blood return noted 04/10/2018  1:38 PM  Dressing Type Transparent 04/10/2018  1:38 PM  Dressing Status Clean;Dry;Intact;Antimicrobial disc in place 04/10/2018  1:38 PM  Dressing Intervention New dressing 04/10/2018  1:38 PM  Dressing Change Due 04/17/18 04/10/2018  1:38 PM       Christella Noa Albarece 04/10/2018, 1:39 PM

## 2018-04-10 NOTE — Progress Notes (Signed)
Attempted right upper arm PICC via lateral brachial vein, was unable to thread introducer, attempted x 3. Became uncomfortable for patient, aborted attempt. Some swelling and discomfort felt when pressing on site. Attempted right upper arm PICC via medial brachial vein without difficulty. He became emotional and was crying because this PICC insertion had some difficulty with placement. Father and sister made aware. Reported to RN. Will continue to monitor. Recommend PICC be left in place until chemotherapy treatment completed.

## 2018-04-10 NOTE — Progress Notes (Signed)
Calculations and dosage of doxorubicin, etoposide and vincristine verified by another chemo certified nurse.

## 2018-04-10 NOTE — Progress Notes (Signed)
HEMATOLOGY/ONCOLOGY CONSULTATION NOTE  Date of Service: 04/10/2018  Patient Care Team: Patient, No Pcp Per as PCP - General (General Practice)  CHIEF COMPLAINTS/PURPOSE OF CONSULTATION:  C2 EPOCH-R Treatment for Primary Mediastinal B-Cell Non-Hodgkin's Lymphoma  HISTORY OF PRESENTING ILLNESS:   Nicholas Moreno is a wonderful 23 y.o. male who has been admitted today for C2 EPOCH-R treatment of his Primary Mediastinal B-Cell Non-Hodgkin's Lymphoma. He is accompanied today by his father and sister at bedside. The pt reports that he is doing well overall.   The pt reports that he has not developed any new concerns. He denies swelling in his arm and notes that his breathing is improved. He is eating better again and has gained a couple pounds. The pt has continued on his blood thinners.   He denies mouth sores and any concerns for infections.  Lab results today (04/10/18) of CBC w/diff and CMP is as follows: all values are WNL except for HGB at 11.7, HCT at 37.6, RDW at 18.0, PLT at 613k, Basophils abs at 200, Glucose at 101.  He has a PET/CT on 04/03/2018 showed - 1. Marked reduction in volume LEFT upper lobe, mediastinal, and RIGHT upper lobe bulky masses. The residual mass lesions havemild to moderate peripheral hypermetabolic activity with central necrosis. The anterior mediastinal nodal masses are partially calcified. 2. No evidence of disease progression. 3. Normal marrow and spleen. 4. Metabolic activity in the testicles is favored physiologic. 5. 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  On review of systems, pt reports eating well, weight gain, improved breathing, moving his bowels well, and denies SOB, arm swelling, mouth sores, concerns for infections, abdominal pains, problems passing urine, skin rashes, diarrhea, leg swelling, and any other symptoms.   MEDICAL HISTORY:  History  reviewed. No pertinent past medical history.  SURGICAL HISTORY: Past Surgical History:  Procedure Laterality Date  . MEDIASTINOSCOPY N/A 03/10/2018   Procedure: MEDIASTINOSCOPY;  Surgeon: Ivin Poot, MD;  Location: [place];  Service: Thoracic;  Laterality: N/A;  . VIDEO ASSISTED THORACOSCOPY (VATS)/ LYMPH NODE SAMPLING Left 03/10/2018   Procedure: VIDEO ASSISTED THORACOSCOPY (VATS)/ BIOSPY ANTERIOR APPROACH;  Surgeon: Ivin Poot, MD;  Location: [place];  Service: Thoracic;  Laterality: Left;  Marland Kitchen VIDEO BRONCHOSCOPY Left 03/10/2018   Procedure: VIDEO BRONCHOSCOPY;  Surgeon: Ivin Poot, MD;  Location: [facility] OR;  Service: Thoracic;  Laterality: Left;    SOCIAL HISTORY: Social History   Socioeconomic History  . Marital status: Single    Spouse name: Not on file  . Number of children: Not on file  . Years of education: Not on file  . Highest education level: Not on file  Occupational History  . Not on file  Social Needs  . Financial resource strain: Not on file  . Food insecurity:    Worry: Not on file    Inability: Not on file  . Transportation needs:    Medical: Not on file    Non-medical: Not on file  Tobacco Use  . Smoking status: Never Smoker  . Smokeless tobacco: Never Used  Substance and Sexual Activity  . Alcohol use: Not Currently  . Drug use: Not Currently  . Sexual activity: Not on file  Lifestyle  . Physical activity:    Days per week: Not on file    Minutes per session: Not on file  . Stress: Not on file  Relationships  . Social  connections:    Talks on phone: Not on file    Gets together: Not on file    Attends religious service: Not on file    Active member of club or organization: Not on file    Attends meetings of clubs or organizations: Not on file    Relationship status: Not on file  . Intimate partner violence:    Fear of current or ex partner: Not on file    Emotionally abused: Not on file    Physically abused: Not on file    Forced sexual  activity: Not on file  Other Topics Concern  . Not on file  Social History Narrative  . Not on file    FAMILY HISTORY: History reviewed. No pertinent family history.  ALLERGIES:  has No Known Allergies.  MEDICATIONS:  Current Facility-Administered Medications  Medication Dose Route Frequency Provider Last Rate Last Dose  . 0.9 %  sodium chloride infusion   Intravenous Continuous Brunetta Genera, MD 20 mL/hr at 04/10/18 1630    . acetaminophen (TYLENOL) tablet 650 mg  650 mg Oral Q4H PRN Brunetta Genera, MD      . albuterol (PROVENTIL) (2.5 MG/3ML) 0.083% nebulizer solution 2.5 mg  2.5 mg Nebulization Q4H PRN Brunetta Genera, MD      . Cold Pack 1 packet  1 packet Topical Once PRN Brunetta Genera, MD      . DOXOrubicin (ADRIAMYCIN) 16 mg, etoposide (VEPESID) 78 mg, vinCRIStine (ONCOVIN) 0.6 mg in sodium chloride 0.9 % 1,000 mL chemo infusion   Intravenous Once Brunetta Genera, MD      . enoxaparin (LOVENOX) injection 55 mg  55 mg Subcutaneous Q12H Brunetta Genera, MD      . Hot Pack 1 packet  1 packet Topical Once PRN Brunetta Genera, MD      . ondansetron [facility]) 8 mg, dexamethasone (DECADRON) 10 mg in sodium chloride 0.9 % 50 mL IVPB   Intravenous Once Brunetta Genera, MD      . predniSONE (DELTASONE) tablet 60 mg  60 mg Oral Q breakfast Brunetta Genera, MD   60 mg at 04/10/18 1628  . senna-docusate (Senokot-S) tablet 1 tablet  1 tablet Oral QHS PRN Brunetta Genera, MD      . sodium chloride flush (NS) 0.9 % injection 10-40 mL  10-40 mL Intracatheter Q12H Brunetta Genera, MD   10 mL at 04/10/18 1413  . sodium chloride flush (NS) 0.9 % injection 10-40 mL  10-40 mL Intracatheter PRN Brunetta Genera, MD      . traMADol Veatrice Bourbon) tablet 50 mg  50 mg Oral Q6H PRN Brunetta Genera, MD        REVIEW OF SYSTEMS:    A 10+ POINT REVIEW OF SYSTEMS WAS OBTAINED including neurology, dermatology, psychiatry, cardiac, respiratory,  lymph, extremities, GI, GU, Musculoskeletal, constitutional, breasts, reproductive, HEENT.  All pertinent positives are noted in the HPI.  All others are negative.   PHYSICAL EXAMINATION: ECOG PERFORMANCE STATUS: 1 - Symptomatic but completely ambulatory  . Vitals:   04/10/18 1018  BP: 106/62  Pulse: 87  Resp: 20  Temp: 97.6 F (36.4 C)  SpO2: 100%   Filed Weights   04/10/18 1018  Weight: 120 lb (54.4 kg)   .Body mass index is 19.38 kg/m.  GENERAL:alert, in no acute distress and comfortable SKIN: no acute rashes, no significant lesions EYES: conjunctiva are pink and non-injected, sclera anicteric OROPHARYNX: MMM, no exudates, no  oropharyngeal erythema or ulceration NECK: supple, no JVD LYMPH:  no palpable lymphadenopathy in the cervical, axillary or inguinal regions LUNGS: clear to auscultation b/l with some decreased air entry left lung base HEART: regular rate & rhythm ABDOMEN:  normoactive bowel sounds , non tender, not distended. No palpable hepatosplenomegaly.  Extremity: no pedal edema PSYCH: alert & oriented x 3 with fluent speech NEURO: no focal motor/sensory deficits   LABORATORY DATA:  I have reviewed the data as listed  . CBC Latest Ref Rng & Units 04/10/2018 03/26/2018 03/19/2018  WBC 4.0 - 10.5 K/uL 5.6 10.9(H) 8.1  Hemoglobin 13.0 - 17.0 g/dL 11.7(L) 10.4(L) 11.0(L)  Hematocrit 39.0 - 52.0 % 37.6(L) 32.6(L) 34.7(L)  Platelets 150 - 400 K/uL 613(H) 249 517(H)    . CMP Latest Ref Rng & Units 04/10/2018 03/26/2018 03/19/2018  Glucose 70 - 99 mg/dL 101(H) 106(H) 101(H)  BUN 6 - 20 mg/dL 10 13 13   Creatinine 0.61 - 1.24 mg/dL 0.67 0.80 0.44(L)  Sodium 135 - 145 mmol/L 141 137 141  Potassium 3.5 - 5.1 mmol/L 3.5 3.7 3.6  Chloride 98 - 111 mmol/L 105 101 102  CO2 22 - 32 mmol/L 26 26 29   Calcium 8.9 - 10.3 mg/dL 9.2 8.8(L) 9.0  Total Protein 6.5 - 8.1 g/dL 7.4 6.4(L) 5.7(L)  Total Bilirubin 0.3 - 1.2 mg/dL 0.7 0.4 0.9  Alkaline Phos 38 - 126 U/L 69 154(H)  73  AST 15 - 41 U/L 26 36 24  ALT 0 - 44 U/L 28 111(H) 53(H)     RADIOGRAPHIC STUDIES: I have personally reviewed the radiological images as listed and agreed with the findings in the report. Dg Chest 2 View  Result Date: 03/18/2018 CLINICAL DATA:  23 year old who presented on 03/07/2018 with a large mediastinal mass and small LEFT upper lobe pulmonary emboli. Patient also has a small LEFT pleural effusion for which she underwent prior thoracentesis. 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 EXAM: CHEST - 2 VIEW COMPARISON:  03/14/2018 and earlier, including CTA chest 03/07/2018. FINDINGS: RIGHT arm PICC tip remains at or near the cavoatrial junction. Large mediastinal mass as noted previously. Stable moderate-sized LEFT pleural effusion, a partially loculated hydropneumothorax as there is an air-fluid level in the LATERAL pleural space. Passive atelectasis in the LEFT lung, with slight improved aeration in the LEFT LOWER LOBE since the examination 4 days ago. Stable very small RIGHT pleural effusion. No new pulmonary parenchymal abnormalities. IMPRESSION: 1. Stable large mediastinal mass. 2. Stable moderate-sized LEFT pleural effusion, a partially loculated hydropneumothorax. 3. Improved aeration in the LEFT LOWER LOBE since the examination 4 days ago. 4. No new abnormalities. Electronically Signed   By: Evangeline Dakin M.D.   On: 03/18/2018 13:28   Dg Chest 2 View  Result Date: 03/14/2018 CLINICAL DATA:  Shortness of breath EXAM: CHEST - 2 VIEW COMPARISON:  03/13/2018 FINDINGS: Right jugular central line is again seen. Left-sided apical pneumothorax is again noted and slightly improved when compared with the prior exam. Persistent mediastinal masses are noted stable from the prior exam. Persistent small left pleural effusion is noted as well as left basilar consolidation. IMPRESSION: Slight decrease in left-sided hydropneumothorax. Persistent  mediastinal masses with evidence of left basilar consolidation. Electronically Signed   By: Inez Catalina M.D.   On: 03/14/2018 07:16   Dg Chest 2 View  Result Date: 03/13/2018 CLINICAL DATA:  Follow-up LEFT hydropneumothorax after chest tube removal. EXAM: CHEST - 2 VIEW COMPARISON:  03/12/2018  dating back to 03/08/2018. CT chest 03/07/2018. FINDINGS: RIGHT jugular central venous catheter tip projects over the LOWER SVC, unchanged stable LEFT apicolateral pneumothorax since yesterday, on the order of 15% or so. Loculated LEFT pleural effusion with air-fluid levels, unchanged. Massive mediastinal mass with compression of the LEFT lung and compression of the MEDIAL RIGHT UPPER LOBE as noted on the prior CT. Slight improved aeration in the LEFT UPPER LOBE since yesterday. No new abnormalities. IMPRESSION: 1. Stable LEFT apicolateral pneumothorax and loculated LEFT hydropneumothorax after chest tube removal. 2. Slight improved aeration in the LEFT UPPER LOBE since yesterday. 3. Otherwise, no change in the massive mediastinal mass with compressive atelectasis involving much of the LEFT lung and the MEDIAL RIGHT UPPER LOBE. 4. No new abnormalities. Electronically Signed   By: Evangeline Dakin M.D.   On: 03/13/2018 09:17   Dg Chest Bilateral Decubitus  Result Date: 03/18/2018 CLINICAL DATA:  Large mediastinal mass and moderate-sized LEFT pleural effusion (partially loculated hydropneumothorax) and small RIGHT pleural effusion. EXAM: CHEST - BILATERAL DECUBITUS VIEW COMPARISON:  Chest x-rays earlier same day and previously. FINDINGS: Very small freely layering RIGHT pleural effusion. Partially loculated LEFT hydropneumothorax, though there is a freely layering component to the LEFT pleural effusion. IMPRESSION: 1. Partially loculated LEFT hydropneumothorax though there is a freely layering component to the LEFT pleural effusion. 2. Freely layering very small RIGHT pleural effusion. Electronically Signed   By:  Evangeline Dakin M.D.   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 Status post mediastinoscopy and VATS. EXAM: CT ABDOMEN AND PELVIS WITH CONTRAST TECHNIQUE: Multidetector CT imaging of the abdomen and pelvis was performed using the standard protocol following bolus administration of intravenous contrast. CONTRAST:  174mL OMNIPAQUE IOHEXOL 300 MG/ML  SOLN COMPARISON:  Recent chest CT 03/07/2018 FINDINGS: Lower chest: Persistent left pleural effusion containing air from recent surgery. There is also air in the subcutaneous tissues of the anterior chest wall. Left lower lobe atelectasis. The heart is normal in size. The distal esophagus is grossly normal. Small right pleural effusion. The right lung base is grossly clear. Hepatobiliary: No focal hepatic lesions or intrahepatic biliary dilatation. The gallbladder is normal. No common bile duct dilatation. Pancreas: No mass, inflammation or ductal dilatation. Spleen: Normal size.  No focal lesions. Adrenals/Urinary Tract: Adrenal glands are unremarkable. Both kidneys demonstrate patchy peripheral perfusion defects typically seen with pyelonephritis. Recommend correlation with urinalysis. Stomach/Bowel: The stomach, duodenum, small bowel and colon are grossly normal. No acute inflammatory changes, mass lesions or obstructive findings. Vascular/Lymphatic: No mesenteric or retroperitoneal mass or adenopathy. The aorta and branch vessels are normal. The major venous structures are patent. Reproductive: The prostate gland and seminal vesicles are unremarkable. 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 Musculoskeletal: No significant bony findings. No worrisome bone lesions. IMPRESSION: 1. Bilateral pleural effusions, left  greater than right with left lower lobe atelectasis. There is also some pleural air and subcutaneous air on the left side consistent with recent surgery. 2. No lymphadenopathy in the abdomen or pelvis. 3. Patchy perfusion abnormalities involving both kidneys most typically seen with pyelonephritis. Recommend correlation with urinalysis. 4. Body wall edema, mesenteric edema and free pelvic fluid as above. Electronically Signed   By: Marijo Sanes M.D.   On: 03/13/2018 19:32  Nm Pet Image Initial (pi) Skull Base To Thigh  Result Date: 04/03/2018 CLINICAL DATA:  Subsequent treatment strategy for non-Hodgkin's lymphoma. B-cell lymphoma. Mediastinal mass. EXAM: NUCLEAR MEDICINE PET SKULL BASE TO THIGH TECHNIQUE: 6.4 mCi F-18 FDG was injected intravenously. Full-ring PET imaging was performed from the skull base to thigh after the radiotracer. CT data was obtained and used for attenuation correction and anatomic localization. Fasting blood glucose: 82 mg/dl COMPARISON:  Chest CT 03/07/2018 FINDINGS: Mediastinal blood pool activity: SUV max 1.37 NECK: No hypermetabolic lymph nodes in the neck. There is decreased metabolism within the LEFT vocal cord consistent with impingement upon the LEFT recurrent laryngeal nerve by the mediastinal mass. Incidental CT findings: none CHEST: There is marked decrease in volume of the bulky anterior mediastinal mass compared to CT 03/07/2018. Mass involving the LEFT hemithorax measured roughly 13 cm by 8.6 cm and now measures 7.0 cm by 6.1 cm. The residual LEFT hemithorax mass has only mild-to-moderate peripheral metabolic activity with SUV max equal 3.9. The central portion of the mass is photopenic consistent with necrosis. Likewise mass involving the superior RIGHT hemithorax is decreased in volume measuring 4.6 by 3.3 cm decreased from 8.0 by 6.7 cm. This RIGHT upper lobe mass also has only peripheral metabolic activity SUV max equal 5.1. The more intense intense activity is  associated bronchiectasis and atelectasis of the RIGHT upper lobe. Centrally within the anterior mediastinum there is interval peripheral calcification of the previously ill-defined mass which occupied the anterior mediastinum. The tissue within the anterior mediastinum is mildly hypermetabolic with SUV max equal 4.4. No new or suspicious pulmonary nodules. No axillary adenopathy which is hypermetabolic. Incidental CT findings: none ABDOMEN/PELVIS: Spleen is normal size and normal metabolic activity. No hypermetabolic abdominal or pelvic lymph nodes. Liver is normal. Bowel normal. Physiologic activity noted within the testicles Incidental CT findings: None SKELETON: Mild diffuse marrow activity. No focality. The marrow activity is less than normal liver activity. Physiologic activity noted within the testicles. Incidental CT findings: none IMPRESSION: 1. Marked reduction in volume LEFT upper lobe, mediastinal, and RIGHT upper lobe bulky masses. The residual mass lesions havemild to moderate peripheral hypermetabolic activity with central necrosis. The anterior mediastinal nodal masses are partially calcified. 2. No evidence of disease progression. 3. Normal marrow and spleen. 4. Metabolic activity in the testicles is favored physiologic. 5. 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 Electronically Signed   By: Suzy Bouchard M.D.   On: 04/03/2018 16:08   Dg Chest Port 1 View  Result Date: 03/12/2018 CLINICAL DATA:  23 year old male with mediastinal mass, status post VATS and biopsy with mediastinoscopy 05/12/2017, suspected lymphoma. EXAM: PORTABLE CHEST 1 VIEW COMPARISON:  Chest x-ray 03/11/2018, 03/10/2018, 03/08/2018, CT 03/07/2018 FINDINGS: Cardiomediastinal silhouette partially obscured by lung/pleural disease/mediastinal disease, unchanged. In the interval there has been development of left apical pneumothorax  component. Opacity at the left base obscuring the left costophrenic angle in the left heart border. Unchanged left thoracostomy tube. Right IJ central venous catheter, unchanged. No right-sided confluent airspace disease. No pleural effusion or pneumothorax. IMPRESSION: Interval development of left apical pneumothorax, either ex vacuo, or representing air leak. Unchanged position of thoracostomy tube. Unchanged right IJ central venous catheter. Similar appearance of cardiomediastinal silhouette, with obscuration of the heart borders and predominantly left lung by known mediastinal mass and left lung consolidation, status post VATS and mediastinoscopy. These results were called by telephone at the time of  interpretation on 03/12/2018 at 10:06 am to nurse caring for patient, Ms Lilia Pro in [facility]. Electronically Signed   By: Corrie Mckusick D.O.   On: 03/12/2018 10:07   Vas [place] Upper Extremity Venous Duplex  Result Date: 03/12/2018 UPPER VENOUS STUDY  Indications: Edema, and SVC syndrome, chest mass Performing Technologist: Landry Mellow RDMS, RVT  Examination Guidelines: A complete evaluation includes B-mode imaging, spectral Doppler, color Doppler, and power Doppler as needed of all accessible portions of each vessel. Bilateral testing is considered an integral part of a complete examination. Limited examinations for reoccurring indications may be performed as noted.  Right Findings: +----------+------------+----------+---------+-----------+---------------------+ RIGHT     CompressiblePropertiesPhasicitySpontaneous       Summary        +----------+------------+----------+---------+-----------+---------------------+ IJV                                                  unable to image due                                                           to IJ line       +----------+------------+----------+---------+-----------+---------------------+ Subclavian    Full                 Yes       Yes                           +----------+------------+----------+---------+-----------+---------------------+ Axillary      Full                 Yes       Yes                          +----------+------------+----------+---------+-----------+---------------------+ Brachial      Full                 Yes       Yes                          +----------+------------+----------+---------+-----------+---------------------+ Radial        Full                 Yes       Yes                          +----------+------------+----------+---------+-----------+---------------------+ Ulnar         Full                                                        +----------+------------+----------+---------+-----------+---------------------+ Cephalic      Full                                                        +----------+------------+----------+---------+-----------+---------------------+  Basilic       Full                                                        +----------+------------+----------+---------+-----------+---------------------+  Left Findings: +----------+------------+----------+---------+-----------+-------+ LEFT      CompressiblePropertiesPhasicitySpontaneousSummary +----------+------------+----------+---------+-----------+-------+ IJV         Partial                Yes       Yes     Acute  +----------+------------+----------+---------+-----------+-------+ Subclavian  Partial                Yes       Yes     Acute  +----------+------------+----------+---------+-----------+-------+ Axillary      Full                 Yes       Yes            +----------+------------+----------+---------+-----------+-------+ Brachial      Full                 Yes       Yes            +----------+------------+----------+---------+-----------+-------+ Radial        Full                                           +----------+------------+----------+---------+-----------+-------+ Ulnar         Full                                          +----------+------------+----------+---------+-----------+-------+ Cephalic      Full                                          +----------+------------+----------+---------+-----------+-------+ Basilic       Full                                          +----------+------------+----------+---------+-----------+-------+  Summary:  Right: No evidence of deep vein thrombosis in the upper extremity. No evidence of superficial vein thrombosis in the upper extremity.  Left: No evidence of superficial vein thrombosis in the upper extremity. Findings consistent with acute deep vein thrombosis involving the left internal jugular veins and left subclavian veins.  *See table(s) above for measurements and observations.  Diagnosing physician: Quay Burow MD Electronically signed by Quay Burow MD on 03/12/2018 at 4:53:40 PM.    Final    [place] Ekg Site Rite  Result Date: 04/10/2018 If Site Rite image not attached, placement could not be confirmed due to current cardiac rhythm.  [place] Ekg Site Rite  Result Date: 03/14/2018 If Site Rite image not attached, placement could not be confirmed due to current cardiac rhythm.   ASSESSMENT & PLAN:  23 y.o. male with  #1Recently diagnosed Primary Mediastinal B cell Non Hodgkins lymphoma c-MYC negative Not a double hit lymphoma Pathology  confirmed by pathology read at [facility].  #2 s/p SVC compression due to mediastinal mass without overt SVC syndrome clinical symptoms #3 small acute distal left upper lobe pulmonary embolus-on lovenox #4 Acute Left IJ and Subclavian DVT due to left sided venous compression due to mass and due to malignancy -on lovenox for malignancy related thrombosis. #4 left-sided pleural effusion and left-sided lung atelectasis due to airway compression from mediastinal mass - improved on CXR  yesterday #5 significant weight loss of 20 pounds likely due to lymphoma- resolving with treatment. #6 drenching night sweats likely has constitutional symptoms from his PMBCL - resolved   PLAN: -Discussed pt labwork today, 04/10/18; PLT higher at 613k, blood counts and chemistries are otherwise stable  -discussed PET/CT results from 04/03/2018 showing significant early response. -The pt has no prohibitive toxicities from continuing [place] at this time. -chemotherapy orders placed, reviewed and signed and discussed with pharmacist. -Continue eating well, staying hydrated, and staying as active as reasonably possible  -Will order CXR to evaluate for fluid around the lungs -04/16/17 Neulasta and Rituxan as outpatient -Discussed that if the patient continues tolerating treatment well, we will consider a one level dose escalation for C3  -Adding Senna S BID until bowel movements normalize -Continue therapeutic Lovenox   All of the patients questions were answered with apparent satisfaction. The patient knows to call the clinic with any problems, questions or concerns.    Sullivan Lone MD MS AAHIVMS [facility] Hematology/Oncology Physician [facility]  (Office):       [phone] (Work cell):  [phone] (Fax):           [phone]  04/10/2018 4:45 PM  I, Baldwin [place], am acting as a scribe for Dr. Sullivan Lone.   .I have reviewed the above documentation for accuracy and completeness, and I agree with the above. Sullivan Lone MD MS

## 2018-04-11 ENCOUNTER — Inpatient Hospital Stay (HOSPITAL_COMMUNITY): Payer: BLUE CROSS/BLUE SHIELD

## 2018-04-11 LAB — COMPREHENSIVE METABOLIC PANEL
ALT: 26 U/L (ref 0–44)
AST: 22 U/L (ref 15–41)
Albumin: 3.6 g/dL (ref 3.5–5.0)
Alkaline Phosphatase: 58 U/L (ref 38–126)
Anion gap: 8 (ref 5–15)
BUN: 9 mg/dL (ref 6–20)
CO2: 23 mmol/L (ref 22–32)
Calcium: 8.6 mg/dL — ABNORMAL LOW (ref 8.9–10.3)
Chloride: 107 mmol/L (ref 98–111)
Creatinine, Ser: 0.67 mg/dL (ref 0.61–1.24)
GFR calc Af Amer: >60 mL/min (ref >60)
GFR calc non Af Amer: >60 mL/min (ref >60)
Glucose, Bld: 133 mg/dL — ABNORMAL HIGH (ref 70–99)
Potassium: 3.9 mmol/L (ref 3.5–5.1)
Sodium: 138 mmol/L (ref 135–145)
Total Bilirubin: 0.6 mg/dL (ref 0.3–1.2)
Total Protein: 6.3 g/dL — ABNORMAL LOW (ref 6.5–8.1)

## 2018-04-11 LAB — CBC
HCT: 31.5 % — ABNORMAL LOW (ref 39.0–52.0)
Hemoglobin: 10 g/dL — ABNORMAL LOW (ref 13.0–17.0)
MCH: 26.5 pg (ref 26.0–34.0)
MCHC: 31.7 g/dL (ref 30.0–36.0)
MCV: 83.6 fL (ref 80.0–100.0)
Platelets: 517 10*3/uL — ABNORMAL HIGH (ref 150–400)
RBC: 3.77 MIL/uL — ABNORMAL LOW (ref 4.22–5.81)
RDW: 17.6 % — AB (ref 11.5–15.5)
WBC: 9.1 10*3/uL (ref 4.0–10.5)
nRBC: 0 % (ref 0.0–0.2)

## 2018-04-11 MED ORDER — VINCRISTINE SULFATE CHEMO INJECTION 1 MG/ML
Freq: Once | INTRAVENOUS | Status: AC
  Administered 2018-04-11: 16:00:00 via INTRAVENOUS
  Filled 2018-04-11: qty 8

## 2018-04-11 MED ORDER — SODIUM CHLORIDE 0.9 % IV SOLN
Freq: Once | INTRAVENOUS | Status: AC
  Administered 2018-04-11: 18 mg via INTRAVENOUS
  Filled 2018-04-11: qty 4

## 2018-04-11 NOTE — Progress Notes (Signed)
HEMATOLOGY/ONCOLOGY INPATIENT PROGRESS NOTE  Date of Service: 04/11/2018  Inpatient Attending: .Brunetta Genera, MD   SUBJECTIVE:   Nicholas Moreno is accompanied today by sisters. The pt reports that he is doing well overall and has no acute new concerns. No nausea. Eating well. 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 Discussed that given PMBCL would not pursue IT MTX for CNS prophylaxis.   Lab results today (04/11/18) reviewed with patient. Stable.  OBJECTIVE:  NAD  PHYSICAL EXAMINATION: . Vitals:   04/10/18 1018 04/10/18 2111 04/11/18 0607  BP: 106/62 109/68 110/73  Pulse: 87 88 76  Resp: 20 16 15   Temp: 97.6 F (36.4 C) 98.3 F (36.8 C) 98.2 F (36.8 C)  TempSrc: Oral Oral Oral  SpO2: 100% 100% 100%  Weight: 120 lb (54.4 kg)    Height: 5' 5.98" (1.676 m)     Filed Weights   04/10/18 1018  Weight: 120 lb (54.4 kg)   .Body mass index is 19.38 kg/m.  GENERAL:alert, in no acute distress and comfortable SKIN: no acute rashes, no significant lesions EYES: conjunctiva are pink and non-injected, sclera anicteric OROPHARYNX: MMM, no exudates, no oropharyngeal erythema or ulceration NECK: supple, no JVD LYMPH:  no palpable lymphadenopathy in the cervical, axillary or inguinal regions LUNGS: clear to auscultation b/l with some decreased air entry left lung base HEART: regular rate & rhythm ABDOMEN:  normoactive bowel sounds , non tender, not distended. No palpable hepatosplenomegaly.  Extremity: no pedal edema PSYCH: alert & oriented x 3 with fluent speech NEURO: no focal motor/sensory deficits   MEDICAL HISTORY:  History reviewed. No pertinent past medical history.  SURGICAL HISTORY: Past Surgical History:  Procedure Laterality Date  . MEDIASTINOSCOPY N/A 03/10/2018   Procedure: MEDIASTINOSCOPY;  Surgeon: Ivin Poot, MD;  Location: [place];  Service:  Thoracic;  Laterality: N/A;  . VIDEO ASSISTED THORACOSCOPY (VATS)/ LYMPH NODE SAMPLING Left 03/10/2018   Procedure: VIDEO ASSISTED THORACOSCOPY (VATS)/ BIOSPY ANTERIOR APPROACH;  Surgeon: Ivin Poot, MD;  Location: [place];  Service: Thoracic;  Laterality: Left;  Marland Kitchen VIDEO BRONCHOSCOPY Left 03/10/2018   Procedure: VIDEO BRONCHOSCOPY;  Surgeon: Ivin Poot, MD;  Location: [facility];  Service: Thoracic;  Laterality: Left;    SOCIAL HISTORY: Social History   Socioeconomic History  . Marital status: Single    Spouse name: Not on file  . Number of children: Not on file  . Years of education: Not on file  . Highest education level: Not on file  Occupational History  . Not on file  Social Needs  . Financial resource strain: Not on file  . Food insecurity:    Worry: Not on file    Inability: Not on file  . Transportation needs:    Medical: Not on file    Non-medical: Not on file  Tobacco Use  . Smoking status: Never Smoker  . Smokeless tobacco: Never Used  Substance and Sexual Activity  . Alcohol use: Not Currently  . Drug use: Not Currently  . Sexual activity: Not on file  Lifestyle  . Physical activity:    Days per week: Not on file    Minutes per session: Not on file  . Stress: Not on file  Relationships  . Social connections:    Talks on phone: Not on file    Gets together: Not on file    Attends religious service: Not on file  Active member of club or organization: Not on file    Attends meetings of clubs or organizations: Not on file    Relationship status: Not on file  . Intimate partner violence:    Fear of current or ex partner: Not on file    Emotionally abused: Not on file    Physically abused: Not on file    Forced sexual activity: Not on file  Other Topics Concern  . Not on file  Social History Narrative  . Not on file    FAMILY HISTORY: History reviewed. No pertinent family history.  ALLERGIES:  has No Known Allergies.  MEDICATIONS:  Scheduled  Meds: . 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 enoxaparin  55 mg Subcutaneous Q12H  . predniSONE  60 mg Oral Q breakfast  . sodium chloride flush  10-40 mL Intracatheter Q12H   Continuous Infusions: . sodium chloride 20 mL/hr at 04/11/18 0611  . ondansetron (ZOFRAN) with dexamethasone (DECADRON) IV     PRN Meds:.acetaminophen, albuterol, Cold Pack, Hot Pack, senna-docusate, sodium chloride flush, traMADol  REVIEW OF SYSTEMS:    10 Point review of Systems was done is negative except as noted above.   LABORATORY DATA:  I have reviewed the data as listed  . CBC Latest Ref Rng & Units 04/12/2018 04/11/2018 04/10/2018  WBC 4.0 - 10.5 K/uL 7.7 9.1 5.6  Hemoglobin 13.0 - 17.0 g/dL 10.1(L) 10.0(L) 11.7(L)  Hematocrit 39.0 - 52.0 % 31.8(L) 31.5(L) 37.6(L)  Platelets 150 - 400 K/uL 514(H) 517(H) 613(H)    . CMP Latest Ref Rng & Units 04/11/2018 04/10/2018 03/26/2018  Glucose 70 - 99 mg/dL 133(H) 101(H) 106(H)  BUN 6 - 20 mg/dL 9 10 13   Creatinine 0.61 - 1.24 mg/dL 0.67 0.67 0.80  Sodium 135 - 145 mmol/L 138 141 137  Potassium 3.5 - 5.1 mmol/L 3.9 3.5 3.7  Chloride 98 - 111 mmol/L 107 105 101  CO2 22 - 32 mmol/L 23 26 26   Calcium 8.9 - 10.3 mg/dL 8.6(L) 9.2 8.8(L)  Total Protein 6.5 - 8.1 g/dL 6.3(L) 7.4 6.4(L)  Total Bilirubin 0.3 - 1.2 mg/dL 0.6 0.7 0.4  Alkaline Phos 38 - 126 U/L 58 69 154(H)  AST 15 - 41 U/L 22 26 36  ALT 0 - 44 U/L 26 28 111(H)     RADIOGRAPHIC STUDIES: I have personally reviewed the radiological images as listed and agreed with the findings in the report. Dg Chest 2 View  Result Date: 04/11/2018 CLINICAL DATA:  B-cell lymphoma with malignant pleural effusion. EXAM: CHEST - 2 VIEW COMPARISON:  CT scan 04/03/2018 FINDINGS: The heart is normal in size and stable. Stable appearing mediastinal masses when compared to the recent  chest CT. Significant improvement since the prior chest x-ray. No residual left pleural effusion is identified. The right PICC line is in good position without complicating features. IMPRESSION: Right PICC line in good position, unchanged. Stable mediastinal/hilar mass is since recent chest CT. No persistent/residual left pleural effusion. Electronically Signed   By: Marijo Sanes M.D.   On: 04/11/2018 09:46   Dg Chest 2 View  Result Date: 03/18/2018 CLINICAL DATA:  22 year old who presented on 03/07/2018 with a large mediastinal mass and small LEFT upper lobe pulmonary emboli. Patient also has a small LEFT pleural effusion for which she underwent prior thoracentesis. 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  EXAM: CHEST - 2 VIEW COMPARISON:  03/14/2018 and earlier, including CTA chest 03/07/2018. FINDINGS: RIGHT arm PICC tip remains at or near the cavoatrial junction. Large mediastinal mass as noted previously. Stable moderate-sized LEFT pleural effusion, a partially loculated hydropneumothorax as there is an air-fluid level in the LATERAL pleural space. Passive atelectasis in the LEFT lung, with slight improved aeration in the LEFT LOWER LOBE since the examination 4 days ago. Stable very small RIGHT pleural effusion. No new pulmonary parenchymal abnormalities. IMPRESSION: 1. Stable large mediastinal mass. 2. Stable moderate-sized LEFT pleural effusion, a partially loculated hydropneumothorax. 3. Improved aeration in the LEFT LOWER LOBE since the examination 4 days ago. 4. No new abnormalities. Electronically Signed   By: Evangeline Dakin M.D.   On: 03/18/2018 13:28   Dg Chest 2 View  Result Date: 03/14/2018 CLINICAL DATA:  Shortness of breath EXAM: CHEST - 2 VIEW COMPARISON:  03/13/2018 FINDINGS: Right jugular central line is again seen. Left-sided apical pneumothorax is again noted and slightly improved when compared with the prior exam. Persistent  mediastinal masses are noted stable from the prior exam. Persistent small left pleural effusion is noted as well as left basilar consolidation. IMPRESSION: Slight decrease in left-sided hydropneumothorax. Persistent mediastinal masses with evidence of left basilar consolidation. Electronically Signed   By: Inez Catalina M.D.   On: 03/14/2018 07:16   Dg Chest 2 View  Result Date: 03/13/2018 CLINICAL DATA:  Follow-up LEFT hydropneumothorax after chest tube removal. EXAM: CHEST - 2 VIEW COMPARISON:  03/12/2018 dating back to 03/08/2018. CT chest 03/07/2018. FINDINGS: RIGHT jugular central venous catheter tip projects over the LOWER SVC, unchanged stable LEFT apicolateral pneumothorax since yesterday, on the order of 15% or so. Loculated LEFT pleural effusion with air-fluid levels, unchanged. Massive mediastinal mass with compression of the LEFT lung and compression of the MEDIAL RIGHT UPPER LOBE as noted on the prior CT. Slight improved aeration in the LEFT UPPER LOBE since yesterday. No new abnormalities. IMPRESSION: 1. Stable LEFT apicolateral pneumothorax and loculated LEFT hydropneumothorax after chest tube removal. 2. Slight improved aeration in the LEFT UPPER LOBE since yesterday. 3. Otherwise, no change in the massive mediastinal mass with compressive atelectasis involving much of the LEFT lung and the MEDIAL RIGHT UPPER LOBE. 4. No new abnormalities. Electronically Signed   By: Evangeline Dakin M.D.   On: 03/13/2018 09:17   Dg Chest Bilateral Decubitus  Result Date: 03/18/2018 CLINICAL DATA:  Large mediastinal mass and moderate-sized LEFT pleural effusion (partially loculated hydropneumothorax) and small RIGHT pleural effusion. EXAM: CHEST - BILATERAL DECUBITUS VIEW COMPARISON:  Chest x-rays earlier same day and previously. FINDINGS: Very small freely layering RIGHT pleural effusion. Partially loculated LEFT hydropneumothorax, though there is a freely layering component to the LEFT pleural effusion.  IMPRESSION: 1. Partially loculated LEFT hydropneumothorax though there is a freely layering component to the LEFT pleural effusion. 2. Freely layering very small RIGHT pleural effusion. Electronically Signed   By: Evangeline Dakin M.D.   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 Status post mediastinoscopy and VATS. EXAM: CT ABDOMEN AND PELVIS WITH CONTRAST TECHNIQUE: Multidetector CT imaging of the abdomen and pelvis was performed using the standard protocol following bolus administration of intravenous contrast. CONTRAST:  126mL OMNIPAQUE IOHEXOL 300 MG/ML  SOLN COMPARISON:  Recent chest CT 03/07/2018 FINDINGS: Lower chest: Persistent left pleural effusion containing air from recent surgery. There is also air in the  subcutaneous tissues of the anterior chest wall. Left lower lobe atelectasis. The heart is normal in size. The distal esophagus is grossly normal. Small right pleural effusion. The right lung base is grossly clear. Hepatobiliary: No focal hepatic lesions or intrahepatic biliary dilatation. The gallbladder is normal. No common bile duct dilatation. Pancreas: No mass, inflammation or ductal dilatation. Spleen: Normal size.  No focal lesions. Adrenals/Urinary Tract: Adrenal glands are unremarkable. Both kidneys demonstrate patchy peripheral perfusion defects typically seen with pyelonephritis. Recommend correlation with urinalysis. Stomach/Bowel: The stomach, duodenum, small bowel and colon are grossly normal. No acute inflammatory changes, mass lesions or obstructive findings. Vascular/Lymphatic: No mesenteric or retroperitoneal mass or adenopathy. The aorta and branch vessels are normal. The major venous structures are patent. Reproductive: The prostate gland and seminal vesicles are unremarkable. 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 Musculoskeletal: No significant bony findings. No worrisome bone lesions. IMPRESSION: 1. Bilateral pleural effusions, left greater than right with left lower lobe atelectasis. There is also some pleural air and subcutaneous air on the left side consistent with recent surgery. 2. No lymphadenopathy in the abdomen or pelvis. 3. Patchy perfusion abnormalities involving both kidneys most typically seen with pyelonephritis. Recommend correlation with urinalysis. 4. Body wall edema, mesenteric edema and free pelvic fluid as above. Electronically Signed   By: Marijo Sanes M.D.   On: 03/13/2018 19:32   Nm Pet Image Initial (pi) Skull Base To Thigh  Result Date: 04/03/2018 CLINICAL DATA:  Subsequent treatment strategy for non-Hodgkin's lymphoma. B-cell lymphoma. Mediastinal mass. EXAM: NUCLEAR MEDICINE PET SKULL BASE TO THIGH TECHNIQUE: 6.4 mCi F-18 FDG was injected intravenously. Full-ring PET imaging was performed from the skull base to thigh after the radiotracer. CT data was obtained and used for attenuation correction and anatomic localization. Fasting blood glucose: 82 mg/dl COMPARISON:  Chest CT 03/07/2018 FINDINGS: Mediastinal blood pool activity: SUV max 1.37 NECK: No hypermetabolic lymph nodes in the neck. There is decreased metabolism within the LEFT vocal cord consistent with impingement upon the LEFT recurrent laryngeal nerve by the mediastinal mass. Incidental CT findings: none CHEST: There is marked decrease in volume of the bulky anterior mediastinal mass compared to CT 03/07/2018. Mass involving the LEFT hemithorax measured roughly 13 cm by 8.6 cm and now measures 7.0 cm by 6.1 cm. The residual LEFT hemithorax mass has only mild-to-moderate peripheral metabolic activity with SUV max equal 3.9. The central portion of the mass is photopenic consistent with necrosis. Likewise mass involving the superior RIGHT  hemithorax is decreased in volume measuring 4.6 by 3.3 cm decreased from 8.0 by 6.7 cm. This RIGHT upper lobe mass also has only peripheral metabolic activity SUV max equal 5.1. The more intense intense activity is associated bronchiectasis and atelectasis of the RIGHT upper lobe. Centrally within the anterior mediastinum there is interval peripheral calcification of the previously ill-defined mass which occupied the anterior mediastinum. The tissue within the anterior mediastinum is mildly hypermetabolic with SUV max equal 4.4. No new or suspicious pulmonary nodules. No axillary adenopathy which is hypermetabolic. Incidental CT findings: none ABDOMEN/PELVIS: Spleen is normal size and normal metabolic activity. No hypermetabolic abdominal or pelvic lymph nodes. Liver is normal. Bowel normal. Physiologic activity noted within the testicles Incidental CT findings: None SKELETON: Mild diffuse marrow activity. No focality. The marrow activity is less than normal liver activity. Physiologic activity noted within  the testicles. Incidental CT findings: none IMPRESSION: 1. Marked reduction in volume LEFT upper lobe, mediastinal, and RIGHT upper lobe bulky masses. The residual mass lesions havemild to moderate peripheral hypermetabolic activity with central necrosis. The anterior mediastinal nodal masses are partially calcified. 2. No evidence of disease progression. 3. Normal marrow and spleen. 4. Metabolic activity in the testicles is favored physiologic. 5. 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 Electronically Signed   By: Suzy Bouchard M.D.   On: 04/03/2018 16:08   [place] Ekg Site Rite  Result Date: 04/10/2018 If Site Rite image not attached, placement could not be confirmed due to current cardiac rhythm.  [place] Ekg Site Rite  Result Date: 03/14/2018 If Site Rite image not attached, placement could not be confirmed due to  current cardiac rhythm.   ASSESSMENT & PLAN:   23 y.o. 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  #2 s/p SVC compression due to mediastinal mass without overt SVC syndrome clinical symptoms #3 small acute distal left upper lobe pulmonary embolus-on lovenox #4 Acute Left IJ and Subclavian DVT due to left sided venous compression due to mass and due to malignancy -on lovenox for malignancy related thrombosis. #4 left-sided pleural effusion and left-sided lung atelectasis due to airway compression from mediastinal mass -resolved on CXR  #5 significant weight loss of 20 pounds likely due to lymphoma- resolving with treatment. #6 drenching night sweats likely has constitutional symptoms from his PMBCL - resolved   PLAN: -Discussed pt labwork today, 04/11/18; stable -The pt has no prohibitive toxicities from continuing [place] at this time. -Continue eating well, staying hydrated, and staying as active as reasonably possible  - CXR reviewed with patient - no significant residual pleural effusion at this time. -discussed pathology report from [facility] confirming primary mediastinal B cell lymphoma -04/16/17 Neulasta and Rituxanas outpatient -Discussed that if the patient continues tolerating treatment well, we will consider a one level dose escalation for C3  -planning for rpt PET/CT after 4 cycles to evaluate treatment response. - Senna S HS prn for constipation -Continue therapeutic Lovenox for left UE DVT and small PE -will plan to have port a cath placed prior to C3.  The total time spent in the appt was 25 minutes and more than 50% was on counseling and direct patient cares.    Sullivan Lone MD [place] AAHIVMS [facility] Hematology/Oncology Physician [facility]  (Office):       [phone] (Work cell):  [phone] (Fax):            [phone]  04/11/2018 1:05 PM

## 2018-04-12 DIAGNOSIS — E876 Hypokalemia: Secondary | ICD-10-CM

## 2018-04-12 LAB — COMPREHENSIVE METABOLIC PANEL
ALT: 18 U/L (ref 0–44)
AST: 14 U/L — ABNORMAL LOW (ref 15–41)
Albumin: 3.4 g/dL — ABNORMAL LOW (ref 3.5–5.0)
Alkaline Phosphatase: 51 U/L (ref 38–126)
Anion gap: 7 (ref 5–15)
BUN: 9 mg/dL (ref 6–20)
CO2: 24 mmol/L (ref 22–32)
CREATININE: 0.58 mg/dL — AB (ref 0.61–1.24)
Calcium: 8.4 mg/dL — ABNORMAL LOW (ref 8.9–10.3)
Chloride: 111 mmol/L (ref 98–111)
GFR calc Af Amer: >60 mL/min (ref >60)
GFR calc non Af Amer: >60 mL/min (ref >60)
Glucose, Bld: 109 mg/dL — ABNORMAL HIGH (ref 70–99)
Potassium: 3.3 mmol/L — ABNORMAL LOW (ref 3.5–5.1)
Sodium: 142 mmol/L (ref 135–145)
TOTAL PROTEIN: 5.5 g/dL — AB (ref 6.5–8.1)
Total Bilirubin: 0.5 mg/dL (ref 0.3–1.2)

## 2018-04-12 LAB — CBC
HCT: 31.8 % — ABNORMAL LOW (ref 39.0–52.0)
Hemoglobin: 10.1 g/dL — ABNORMAL LOW (ref 13.0–17.0)
MCH: 26.2 pg (ref 26.0–34.0)
MCHC: 31.8 g/dL (ref 30.0–36.0)
MCV: 82.6 fL (ref 80.0–100.0)
Platelets: 514 10*3/uL — ABNORMAL HIGH (ref 150–400)
RBC: 3.85 MIL/uL — ABNORMAL LOW (ref 4.22–5.81)
RDW: 18 % — ABNORMAL HIGH (ref 11.5–15.5)
WBC: 7.7 10*3/uL (ref 4.0–10.5)
nRBC: 0 % (ref 0.0–0.2)

## 2018-04-12 MED ORDER — SENNOSIDES-DOCUSATE SODIUM 8.6-50 MG PO TABS
2.0000 | ORAL_TABLET | Freq: Every evening | ORAL | Status: DC | PRN
  Filled 2018-04-12: qty 2

## 2018-04-12 MED ORDER — VINCRISTINE SULFATE CHEMO INJECTION 1 MG/ML
Freq: Once | INTRAVENOUS | Status: AC
  Administered 2018-04-13: 12:00:00 via INTRAVENOUS
  Filled 2018-04-12: qty 8

## 2018-04-12 MED ORDER — SODIUM CHLORIDE 0.9 % IV SOLN
Freq: Once | INTRAVENOUS | Status: AC
  Administered 2018-04-12: 8 mg via INTRAVENOUS
  Filled 2018-04-12: qty 4

## 2018-04-12 MED ORDER — POTASSIUM CHLORIDE CRYS ER 20 MEQ PO TBCR
20.0000 meq | EXTENDED_RELEASE_TABLET | Freq: Two times a day (BID) | ORAL | Status: DC
  Administered 2018-04-12 – 2018-04-14 (×5): 20 meq via ORAL
  Filled 2018-04-12 (×5): qty 1

## 2018-04-12 MED ORDER — SODIUM CHLORIDE 0.9 % IV SOLN
Freq: Once | INTRAVENOUS | Status: AC
  Administered 2018-04-13: 8 mg via INTRAVENOUS
  Filled 2018-04-12: qty 4

## 2018-04-12 MED ORDER — VINCRISTINE SULFATE CHEMO INJECTION 1 MG/ML
Freq: Once | INTRAVENOUS | Status: AC
  Administered 2018-04-12: 14:00:00 via INTRAVENOUS
  Filled 2018-04-12: qty 8

## 2018-04-12 NOTE — H&P (Signed)
HEMATOLOGY/ONCOLOGY CONSULTATION NOTE  Date of Service: 04/12/2018  Patient Care Team: Patient, No Pcp Per as PCP - General (General Practice)  CHIEF COMPLAINTS/PURPOSE OF CONSULTATION:  C2 EPOCH-R Treatment for Primary Mediastinal B-Cell Non-Hodgkin's Lymphoma  HISTORY OF PRESENTING ILLNESS:   Nicholas Moreno is a wonderful 23 y.o. male who has been admitted today for C2 EPOCH-R treatment of his Primary Mediastinal B-Cell Non-Hodgkin's Lymphoma. He is accompanied today by his father and sister at bedside. The pt reports that he is doing well overall.   The pt reports that he has not developed any new concerns. He denies swelling in his arm and notes that his breathing is improved. He is eating better again and has gained a couple pounds. The pt has continued on his blood thinners.   He denies mouth sores and any concerns for infections.  Lab results today (04/10/18) of CBC w/diff and CMP is as follows: all values are WNL except for HGB at 11.7, HCT at 37.6, RDW at 18.0, PLT at 613k, Basophils abs at 200, Glucose at 101.  He has a PET/CT on 04/03/2018 showed - 1. Marked reduction in volume LEFT upper lobe, mediastinal, and RIGHT upper lobe bulky masses. The residual mass lesions havemild to moderate peripheral hypermetabolic activity with central necrosis. The anterior mediastinal nodal masses are partially calcified. 2. No evidence of disease progression. 3. Normal marrow and spleen. 4. Metabolic activity in the testicles is favored physiologic. 5. 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  On review of systems, pt reports eating well, weight gain, improved breathing, moving his bowels well, and denies SOB, arm swelling, mouth sores, concerns for infections, abdominal pains, problems passing urine, skin rashes, diarrhea, leg swelling, and any other symptoms.   MEDICAL HISTORY:  History  reviewed. No pertinent past medical history.  SURGICAL HISTORY: Past Surgical History:  Procedure Laterality Date  . MEDIASTINOSCOPY N/A 03/10/2018   Procedure: MEDIASTINOSCOPY;  Surgeon: Ivin Poot, MD;  Location: [place] [facility];  Service: Thoracic;  Laterality: N/A;  . VIDEO ASSISTED THORACOSCOPY (VATS)/ LYMPH NODE SAMPLING Left 03/10/2018   Procedure: VIDEO ASSISTED THORACOSCOPY (VATS)/ BIOSPY ANTERIOR APPROACH;  Surgeon: Ivin Poot, MD;  Location: [place];  Service: Thoracic;  Laterality: Left;  Marland Kitchen VIDEO BRONCHOSCOPY Left 03/10/2018   Procedure: VIDEO BRONCHOSCOPY;  Surgeon: Ivin Poot, MD;  Location: [facility] OR;  Service: Thoracic;  Laterality: Left;    SOCIAL HISTORY: Social History   Socioeconomic History  . Marital status: Single    Spouse name: Not on file  . Number of children: Not on file  . Years of education: Not on file  . Highest education level: Not on file  Occupational History  . Not on file  Social Needs  . Financial resource strain: Not on file  . Food insecurity:    Worry: Not on file    Inability: Not on file  . Transportation needs:    Medical: Not on file    Non-medical: Not on file  Tobacco Use  . Smoking status: Never Smoker  . Smokeless tobacco: Never Used  Substance and Sexual Activity  . Alcohol use: Not Currently  . Drug use: Not Currently  . Sexual activity: Not on file  Lifestyle  . Physical activity:    Days per week: Not on file    Minutes per session: Not on file  . Stress: Not on file  Relationships  . Social  connections:    Talks on phone: Not on file    Gets together: Not on file    Attends religious service: Not on file    Active member of club or organization: Not on file    Attends meetings of clubs or organizations: Not on file    Relationship status: Not on file  . Intimate partner violence:    Fear of current or ex partner: Not on file    Emotionally abused: Not on file    Physically abused: Not on file    Forced sexual  activity: Not on file  Other Topics Concern  . Not on file  Social History Narrative  . Not on file    FAMILY HISTORY: History reviewed. No pertinent family history.  ALLERGIES:  has No Known Allergies.  MEDICATIONS:  Current Facility-Administered Medications  Medication Dose Route Frequency Provider Last Rate Last Dose  . 0.9 %  sodium chloride infusion   Intravenous Continuous Brunetta Genera, MD 20 mL/hr at 04/11/18 1621    . acetaminophen (TYLENOL) tablet 650 mg  650 mg Oral Q4H PRN Brunetta Genera, MD      . albuterol (PROVENTIL) (2.5 MG/3ML) 0.083% nebulizer solution 2.5 mg  2.5 mg Nebulization Q4H PRN Brunetta Genera, MD      . Cold Pack 1 packet  1 packet Topical Once PRN Brunetta Genera, MD      . DOXOrubicin (ADRIAMYCIN) 16 mg, etoposide (VEPESID) 78 mg, vinCRIStine (ONCOVIN) 0.6 mg in sodium chloride 0.9 % 1,000 mL chemo infusion   Intravenous Once Brunetta Genera, MD 51 mL/hr at 04/11/18 1540    . enoxaparin (LOVENOX) injection 55 mg  55 mg Subcutaneous Q12H Brunetta Genera, MD   55 mg at 04/11/18 2110  . Hot Pack 1 packet  1 packet Topical Once PRN Brunetta Genera, MD      . predniSONE (DELTASONE) tablet 60 mg  60 mg Oral Q breakfast Brunetta Genera, MD   60 mg at 04/11/18 0847  . senna-docusate (Senokot-S) tablet 1 tablet  1 tablet Oral QHS PRN Brunetta Genera, MD      . sodium chloride flush (NS) 0.9 % injection 10-40 mL  10-40 mL Intracatheter Q12H Brunetta Genera, MD   10 mL at 04/11/18 2112  . sodium chloride flush (NS) 0.9 % injection 10-40 mL  10-40 mL Intracatheter PRN Brunetta Genera, MD      . traMADol Veatrice Bourbon) tablet 50 mg  50 mg Oral Q6H PRN Brunetta Genera, MD        REVIEW OF SYSTEMS:    A 10+ POINT REVIEW OF SYSTEMS WAS OBTAINED including neurology, dermatology, psychiatry, cardiac, respiratory, lymph, extremities, GI, GU, Musculoskeletal, constitutional, breasts, reproductive, HEENT.  All pertinent  positives are noted in the HPI.  All others are negative.   PHYSICAL EXAMINATION: ECOG PERFORMANCE STATUS: 1 - Symptomatic but completely ambulatory  . Vitals:   04/11/18 1502 04/11/18 2212  BP: 110/62 112/68  Pulse: 91 81  Resp: 16 16  Temp: 98.3 F (36.8 C) 98.1 F (36.7 C)  SpO2: 100% 100%   Filed Weights   04/10/18 1018  Weight: 120 lb (54.4 kg)   .Body mass index is 19.38 kg/m.  GENERAL:alert, in no acute distress and comfortable SKIN: no acute rashes, no significant lesions EYES: conjunctiva are pink and non-injected, sclera anicteric OROPHARYNX: MMM, no exudates, no oropharyngeal erythema or ulceration NECK: supple, no JVD LYMPH:  no palpable lymphadenopathy in  the cervical, axillary or inguinal regions LUNGS: clear to auscultation b/l with some decreased air entry left lung base HEART: regular rate & rhythm ABDOMEN:  normoactive bowel sounds , non tender, not distended. No palpable hepatosplenomegaly.  Extremity: no pedal edema PSYCH: alert & oriented x 3 with fluent speech NEURO: no focal motor/sensory deficits   LABORATORY DATA:  I have reviewed the data as listed  . CBC Latest Ref Rng & Units 04/11/2018 04/10/2018 03/26/2018  WBC 4.0 - 10.5 K/uL 9.1 5.6 10.9(H)  Hemoglobin 13.0 - 17.0 g/dL 10.0(L) 11.7(L) 10.4(L)  Hematocrit 39.0 - 52.0 % 31.5(L) 37.6(L) 32.6(L)  Platelets 150 - 400 K/uL 517(H) 613(H) 249    . CMP Latest Ref Rng & Units 04/11/2018 04/10/2018 03/26/2018  Glucose 70 - 99 mg/dL 133(H) 101(H) 106(H)  BUN 6 - 20 mg/dL 9 10 13   Creatinine 0.61 - 1.24 mg/dL 0.67 0.67 0.80  Sodium 135 - 145 mmol/L 138 141 137  Potassium 3.5 - 5.1 mmol/L 3.9 3.5 3.7  Chloride 98 - 111 mmol/L 107 105 101  CO2 22 - 32 mmol/L 23 26 26   Calcium 8.9 - 10.3 mg/dL 8.6(L) 9.2 8.8(L)  Total Protein 6.5 - 8.1 g/dL 6.3(L) 7.4 6.4(L)  Total Bilirubin 0.3 - 1.2 mg/dL 0.6 0.7 0.4  Alkaline Phos 38 - 126 U/L 58 69 154(H)  AST 15 - 41 U/L 22 26 36  ALT 0 - 44 U/L 26 28 111(H)       RADIOGRAPHIC STUDIES: I have personally reviewed the radiological images as listed and agreed with the findings in the report. Dg Chest 2 View  Result Date: 04/11/2018 CLINICAL DATA:  B-cell lymphoma with malignant pleural effusion. EXAM: CHEST - 2 VIEW COMPARISON:  CT scan 04/03/2018 FINDINGS: The heart is normal in size and stable. Stable appearing mediastinal masses when compared to the recent chest CT. Significant improvement since the prior chest x-ray. No residual left pleural effusion is identified. The right PICC line is in good position without complicating features. IMPRESSION: Right PICC line in good position, unchanged. Stable mediastinal/hilar mass is since recent chest CT. No persistent/residual left pleural effusion. Electronically Signed   By: Marijo Sanes M.D.   On: 04/11/2018 09:46   Dg Chest 2 View  Result Date: 03/18/2018 CLINICAL DATA:  23 year old who presented on 03/07/2018 with a large mediastinal mass and small LEFT upper lobe pulmonary emboli. Patient also has a small LEFT pleural effusion for which she underwent prior thoracentesis. 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 EXAM: CHEST - 2 VIEW COMPARISON:  03/14/2018 and earlier, including CTA chest 03/07/2018. FINDINGS: RIGHT arm PICC tip remains at or near the cavoatrial junction. Large mediastinal mass as noted previously. Stable moderate-sized LEFT pleural effusion, a partially loculated hydropneumothorax as there is an air-fluid level in the LATERAL pleural space. Passive atelectasis in the LEFT lung, with slight improved aeration in the LEFT LOWER LOBE since the examination 4 days ago. Stable very small RIGHT pleural effusion. No new pulmonary parenchymal abnormalities. IMPRESSION: 1. Stable large mediastinal mass. 2. Stable moderate-sized LEFT pleural effusion, a partially loculated hydropneumothorax. 3. Improved aeration in the LEFT LOWER LOBE since the examination 4  days ago. 4. No new abnormalities. Electronically Signed   By: Evangeline Dakin M.D.   On: 03/18/2018 13:28   Dg Chest 2 View  Result Date: 03/14/2018 CLINICAL DATA:  Shortness of breath EXAM: CHEST - 2 VIEW COMPARISON:  03/13/2018 FINDINGS: Right jugular central line is again  seen. Left-sided apical pneumothorax is again noted and slightly improved when compared with the prior exam. Persistent mediastinal masses are noted stable from the prior exam. Persistent small left pleural effusion is noted as well as left basilar consolidation. IMPRESSION: Slight decrease in left-sided hydropneumothorax. Persistent mediastinal masses with evidence of left basilar consolidation. Electronically Signed   By: Inez Catalina M.D.   On: 03/14/2018 07:16   Dg Chest 2 View  Result Date: 03/13/2018 CLINICAL DATA:  Follow-up LEFT hydropneumothorax after chest tube removal. EXAM: CHEST - 2 VIEW COMPARISON:  03/12/2018 dating back to 03/08/2018. CT chest 03/07/2018. FINDINGS: RIGHT jugular central venous catheter tip projects over the LOWER SVC, unchanged stable LEFT apicolateral pneumothorax since yesterday, on the order of 15% or so. Loculated LEFT pleural effusion with air-fluid levels, unchanged. Massive mediastinal mass with compression of the LEFT lung and compression of the MEDIAL RIGHT UPPER LOBE as noted on the prior CT. Slight improved aeration in the LEFT UPPER LOBE since yesterday. No new abnormalities. IMPRESSION: 1. Stable LEFT apicolateral pneumothorax and loculated LEFT hydropneumothorax after chest tube removal. 2. Slight improved aeration in the LEFT UPPER LOBE since yesterday. 3. Otherwise, no change in the massive mediastinal mass with compressive atelectasis involving much of the LEFT lung and the MEDIAL RIGHT UPPER LOBE. 4. No new abnormalities. Electronically Signed   By: Evangeline Dakin M.D.   On: 03/13/2018 09:17   Dg Chest Bilateral Decubitus  Result Date: 03/18/2018 CLINICAL DATA:  Large  mediastinal mass and moderate-sized LEFT pleural effusion (partially loculated hydropneumothorax) and small RIGHT pleural effusion. EXAM: CHEST - BILATERAL DECUBITUS VIEW COMPARISON:  Chest x-rays earlier same day and previously. FINDINGS: Very small freely layering RIGHT pleural effusion. Partially loculated LEFT hydropneumothorax, though there is a freely layering component to the LEFT pleural effusion. IMPRESSION: 1. Partially loculated LEFT hydropneumothorax though there is a freely layering component to the LEFT pleural effusion. 2. Freely layering very small RIGHT pleural effusion. Electronically Signed   By: Evangeline Dakin M.D.   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 Status post mediastinoscopy and VATS. EXAM: CT ABDOMEN AND PELVIS WITH CONTRAST TECHNIQUE: Multidetector CT imaging of the abdomen and pelvis was performed using the standard protocol following bolus administration of intravenous contrast. CONTRAST:  146mL OMNIPAQUE IOHEXOL 300 MG/ML  SOLN COMPARISON:  Recent chest CT 03/07/2018 FINDINGS: Lower chest: Persistent left pleural effusion containing air from recent surgery. There is also air in the subcutaneous tissues of the anterior chest wall. Left lower lobe atelectasis. The heart is normal in size. The distal esophagus is grossly normal. Small right pleural effusion. The right lung base is grossly clear. Hepatobiliary: No focal hepatic lesions or intrahepatic biliary dilatation. The gallbladder is normal. No common bile duct dilatation. Pancreas: No mass, inflammation or ductal dilatation. Spleen: Normal size.  No focal lesions. Adrenals/Urinary Tract: Adrenal glands are unremarkable. Both kidneys demonstrate patchy peripheral perfusion defects typically seen with pyelonephritis. Recommend correlation with urinalysis. Stomach/Bowel: The stomach, duodenum, small bowel and colon are  grossly normal. No acute inflammatory changes, mass lesions or obstructive findings. Vascular/Lymphatic: No mesenteric or retroperitoneal mass or adenopathy. The aorta and branch vessels are normal. The major venous structures are patent. Reproductive: The prostate gland and seminal vesicles are unremarkable. 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 Musculoskeletal: No significant bony findings. No worrisome bone lesions. IMPRESSION: 1. Bilateral pleural effusions, left greater than right with left lower lobe atelectasis. There is also some pleural air and subcutaneous air on the left side consistent with recent surgery. 2. No lymphadenopathy in the abdomen or pelvis. 3. Patchy perfusion abnormalities involving both kidneys most typically seen with pyelonephritis. Recommend correlation with urinalysis. 4. Body wall edema, mesenteric edema and free pelvic fluid as above. Electronically Signed   By: Marijo Sanes M.D.   On: 03/13/2018 19:32   Nm Pet Image Initial (pi) Skull Base To Thigh  Result Date: 04/03/2018 CLINICAL DATA:  Subsequent treatment strategy for non-Hodgkin's lymphoma. B-cell lymphoma. Mediastinal mass. EXAM: NUCLEAR MEDICINE PET SKULL BASE TO THIGH TECHNIQUE: 6.4 mCi F-18 FDG was injected intravenously. Full-ring PET imaging was performed from the skull base to thigh after the radiotracer. CT data was obtained and used for attenuation correction and anatomic localization. Fasting blood glucose: 82 mg/dl COMPARISON:  Chest CT 03/07/2018 FINDINGS: Mediastinal blood pool activity: SUV max 1.37 NECK: No hypermetabolic lymph nodes in the neck. There is decreased metabolism within the LEFT vocal cord consistent with impingement upon the LEFT recurrent laryngeal nerve by the mediastinal mass. Incidental CT findings: none CHEST: There is marked decrease in  volume of the bulky anterior mediastinal mass compared to CT 03/07/2018. Mass involving the LEFT hemithorax measured roughly 13 cm by 8.6 cm and now measures 7.0 cm by 6.1 cm. The residual LEFT hemithorax mass has only mild-to-moderate peripheral metabolic activity with SUV max equal 3.9. The central portion of the mass is photopenic consistent with necrosis. Likewise mass involving the superior RIGHT hemithorax is decreased in volume measuring 4.6 by 3.3 cm decreased from 8.0 by 6.7 cm. This RIGHT upper lobe mass also has only peripheral metabolic activity SUV max equal 5.1. The more intense intense activity is associated bronchiectasis and atelectasis of the RIGHT upper lobe. Centrally within the anterior mediastinum there is interval peripheral calcification of the previously ill-defined mass which occupied the anterior mediastinum. The tissue within the anterior mediastinum is mildly hypermetabolic with SUV max equal 4.4. No new or suspicious pulmonary nodules. No axillary adenopathy which is hypermetabolic. Incidental CT findings: none ABDOMEN/PELVIS: Spleen is normal size and normal metabolic activity. No hypermetabolic abdominal or pelvic lymph nodes. Liver is normal. Bowel normal. Physiologic activity noted within the testicles Incidental CT findings: None SKELETON: Mild diffuse marrow activity. No focality. The marrow activity is less than normal liver activity. Physiologic activity noted within the testicles. Incidental CT findings: none IMPRESSION: 1. Marked reduction in volume LEFT upper lobe, mediastinal, and RIGHT upper lobe bulky masses. The residual mass lesions havemild to moderate peripheral hypermetabolic activity with central necrosis. The anterior mediastinal nodal masses are partially calcified. 2. No evidence of disease progression. 3. Normal marrow and spleen. 4. Metabolic activity in the testicles is favored physiologic. 5. 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 Electronically Signed   By: Suzy Bouchard M.D.   On: 04/03/2018 16:08   [place] Ekg Site Rite  Result Date: 04/10/2018 If Site Rite image not attached, placement could not be confirmed due to current cardiac rhythm.  [place] Ekg Site Rite  Result Date: 03/14/2018 If Site Rite image not attached, placement could not be confirmed due to current cardiac rhythm.   ASSESSMENT & PLAN:  23 y.o. male with  #1Recently diagnosed Primary  Mediastinal B cell Non Hodgkins lymphoma c-MYC negative Not a double hit lymphoma Pathology confirmed by pathology read at [facility].  #2 s/p SVC compression due to mediastinal mass without overt SVC syndrome clinical symptoms #3 small acute distal left upper lobe pulmonary embolus-on lovenox #4 Acute Left IJ and Subclavian DVT due to left sided venous compression due to mass and due to malignancy -on lovenox for malignancy related thrombosis. #4 left-sided pleural effusion and left-sided lung atelectasis due to airway compression from mediastinal mass - improved on CXR yesterday #5 significant weight loss of 20 pounds likely due to lymphoma- resolving with treatment. #6 drenching night sweats likely has constitutional symptoms from his PMBCL - resolved   PLAN: -Discussed pt labwork today, 04/10/18; PLT higher at 613k, blood counts and chemistries are otherwise stable  -discussed PET/CT results from 04/03/2018 showing significant early response. -The pt has no prohibitive toxicities from continuing [place] at this time. -chemotherapy orders placed, reviewed and signed and discussed with pharmacist. -Continue eating well, staying hydrated, and staying as active as reasonably possible  -Will order CXR to evaluate for fluid around the lungs -04/16/17 Neulasta and Rituxan as outpatient -Discussed that if the patient continues tolerating treatment well, we will consider a one level dose  escalation for C3  -Adding Senna S BID until bowel movements normalize -Continue therapeutic Lovenox   All of the patients questions were answered with apparent satisfaction. The patient knows to call the clinic with any problems, questions or concerns.    Sullivan Lone MD MS AAHIVMS [facility] Hematology/Oncology Physician [facility]  (Office):       [phone] (Work cell):  [phone] (Fax):           [phone]  04/12/2018 12:18 AM  I, Baldwin [place], am acting as a scribe for Dr. Sullivan Lone.   .I have reviewed the above documentation for accuracy and completeness, and I agree with the above. Sullivan Lone MD MS

## 2018-04-12 NOTE — Progress Notes (Signed)
HEMATOLOGY/ONCOLOGY INPATIENT PROGRESS NOTE  Date of Service: 04/12/2018  Inpatient Attending: .Brunetta Genera, MD   SUBJECTIVE:   Nicholas Moreno was seen in follow-up on the inpatient oncology unit.  He notes that he is feeling well and has no acute new concerns.  Labs were reviewed with him and are stable today.  He has no new primary toxicities that would limit him from proceeding with his day 3 of EPOCH-R chemotherapy. Feeling well eating well and has had good sleep overnight. In good spirits. He notes that he has been starting to gain back his lost weight. Previous pain at the right PICC line site has now mostly resolved.   OBJECTIVE:  NAD  PHYSICAL EXAMINATION: . Vitals:   04/11/18 0607 04/11/18 1502 04/11/18 2212 04/12/18 0611  BP: 110/73 110/62 112/68 108/70  Pulse: 76 91 81 71  Resp: 15 16 16 15   Temp: 98.2 F (36.8 C) 98.3 F (36.8 C) 98.1 F (36.7 C) 98.3 F (36.8 C)  TempSrc: Oral Oral Oral Oral  SpO2: 100% 100% 100% 100%  Weight:      Height:       Filed Weights   04/10/18 1018  Weight: 120 lb (54.4 kg)   .Body mass index is 19.38 kg/m.  GENERAL:alert, in no acute distress and comfortable SKIN: no acute rashes, no significant lesions EYES: conjunctiva are pink and non-injected, sclera anicteric OROPHARYNX: MMM, no exudates, no oropharyngeal erythema or ulceration NECK: supple, no JVD LYMPH:  no palpable lymphadenopathy in the cervical, axillary or inguinal regions LUNGS: clear to auscultation b/l with normal respiratory effort HEART: regular rate & rhythm ABDOMEN:  normoactive bowel sounds , non tender, not distended. Extremity: no pedal edema PSYCH: alert & oriented x 3 with fluent speech NEURO: no focal motor/sensory deficits  MEDICAL HISTORY:  History reviewed. No pertinent past medical history.  SURGICAL HISTORY: Past Surgical History:  Procedure Laterality Date  . MEDIASTINOSCOPY N/A 03/10/2018   Procedure: MEDIASTINOSCOPY;   Surgeon: Ivin Poot, MD;  Location: [place];  Service: Thoracic;  Laterality: N/A;  . VIDEO ASSISTED THORACOSCOPY (VATS)/ LYMPH NODE SAMPLING Left 03/10/2018   Procedure: VIDEO ASSISTED THORACOSCOPY (VATS)/ BIOSPY ANTERIOR APPROACH;  Surgeon: Ivin Poot, MD;  Location: [place];  Service: Thoracic;  Laterality: Left;  Marland Kitchen VIDEO BRONCHOSCOPY Left 03/10/2018   Procedure: VIDEO BRONCHOSCOPY;  Surgeon: Ivin Poot, MD;  Location: [facility] OR;  Service: Thoracic;  Laterality: Left;    SOCIAL HISTORY: Social History   Socioeconomic History  . Marital status: Single    Spouse name: Not on file  . Number of children: Not on file  . Years of education: Not on file  . Highest education level: Not on file  Occupational History  . Not on file  Social Needs  . Financial resource strain: Not on file  . Food insecurity:    Worry: Not on file    Inability: Not on file  . Transportation needs:    Medical: Not on file    Non-medical: Not on file  Tobacco Use  . Smoking status: Never Smoker  . Smokeless tobacco: Never Used  Substance and Sexual Activity  . Alcohol use: Not Currently  . Drug use: Not Currently  . Sexual activity: Not on file  Lifestyle  . Physical activity:    Days per week: Not on file    Minutes per session: Not on file  . Stress: Not on file  Relationships  . Social connections:    Talks  on phone: Not on file    Gets together: Not on file    Attends religious service: Not on file    Active member of club or organization: Not on file    Attends meetings of clubs or organizations: Not on file    Relationship status: Not on file  . Intimate partner violence:    Fear of current or ex partner: Not on file    Emotionally abused: Not on file    Physically abused: Not on file    Forced sexual activity: Not on file  Other Topics Concern  . Not on file  Social History Narrative  . Not on file    FAMILY HISTORY: History reviewed. No pertinent family  history.  ALLERGIES:  has No Known Allergies.  MEDICATIONS:  Scheduled Meds: . 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 [START ON 04/13/2018] DOXOrubicin/vinCRIStine/etoposide CHEMO IV infusion for Inpatient CI   Intravenous Once  . enoxaparin  55 mg Subcutaneous Q12H  . predniSONE  60 mg Oral Q breakfast  . sodium chloride flush  10-40 mL Intracatheter Q12H   Continuous Infusions: . sodium chloride 20 mL/hr at 04/12/18 0300   PRN Meds:.acetaminophen, albuterol, Cold Pack, Hot Pack, senna-docusate, sodium chloride flush, traMADol  REVIEW OF SYSTEMS:    10 Point review of Systems was done is negative except as noted above.   LABORATORY DATA:  I have reviewed the data as listed  . CBC Latest Ref Rng & Units 04/12/2018 04/11/2018 04/10/2018  WBC 4.0 - 10.5 K/uL 7.7 9.1 5.6  Hemoglobin 13.0 - 17.0 g/dL 10.1(L) 10.0(L) 11.7(L)  Hematocrit 39.0 - 52.0 % 31.8(L) 31.5(L) 37.6(L)  Platelets 150 - 400 K/uL 514(H) 517(H) 613(H)    . CMP Latest Ref Rng & Units 04/12/2018 04/11/2018 04/10/2018  Glucose 70 - 99 mg/dL 109(H) 133(H) 101(H)  BUN 6 - 20 mg/dL 9 9 10   Creatinine 0.61 - 1.24 mg/dL 0.58(L) 0.67 0.67  Sodium 135 - 145 mmol/L 142 138 141  Potassium 3.5 - 5.1 mmol/L 3.3(L) 3.9 3.5  Chloride 98 - 111 mmol/L 111 107 105  CO2 22 - 32 mmol/L 24 23 26   Calcium 8.9 - 10.3 mg/dL 8.4(L) 8.6(L) 9.2  Total Protein 6.5 - 8.1 g/dL 5.5(L) 6.3(L) 7.4  Total Bilirubin 0.3 - 1.2 mg/dL 0.5 0.6 0.7  Alkaline Phos 38 - 126 U/L 51 58 69  AST 15 - 41 U/L 14(L) 22 26  ALT 0 - 44 U/L 18 26 28     RADIOGRAPHIC STUDIES: I have personally reviewed the radiological images as listed and agreed with the findings in the report. Dg Chest 2 View  Result Date: 04/11/2018 CLINICAL DATA:  B-cell lymphoma with malignant pleural effusion. EXAM: CHEST - 2 VIEW COMPARISON:  CT scan  04/03/2018 FINDINGS: The heart is normal in size and stable. Stable appearing mediastinal masses when compared to the recent chest CT. Significant improvement since the prior chest x-ray. No residual left pleural effusion is identified. The right PICC line is in good position without complicating features. IMPRESSION: Right PICC line in good position, unchanged. Stable mediastinal/hilar mass is since recent chest CT. No persistent/residual left pleural effusion. Electronically Signed   By: Marijo Sanes M.D.   On: 04/11/2018 09:46   Dg Chest 2 View  Result Date: 03/18/2018 CLINICAL DATA:  23 year old who presented on 03/07/2018 with a large mediastinal mass and small LEFT  upper lobe pulmonary emboli. Patient also has a small LEFT pleural effusion for which she underwent prior thoracentesis. 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 EXAM: CHEST - 2 VIEW COMPARISON:  03/14/2018 and earlier, including CTA chest 03/07/2018. FINDINGS: RIGHT arm PICC tip remains at or near the cavoatrial junction. Large mediastinal mass as noted previously. Stable moderate-sized LEFT pleural effusion, a partially loculated hydropneumothorax as there is an air-fluid level in the LATERAL pleural space. Passive atelectasis in the LEFT lung, with slight improved aeration in the LEFT LOWER LOBE since the examination 4 days ago. Stable very small RIGHT pleural effusion. No new pulmonary parenchymal abnormalities. IMPRESSION: 1. Stable large mediastinal mass. 2. Stable moderate-sized LEFT pleural effusion, a partially loculated hydropneumothorax. 3. Improved aeration in the LEFT LOWER LOBE since the examination 4 days ago. 4. No new abnormalities. Electronically Signed   By: Evangeline Dakin M.D.   On: 03/18/2018 13:28   Dg Chest 2 View  Result Date: 03/14/2018 CLINICAL DATA:  Shortness of breath EXAM: CHEST - 2 VIEW COMPARISON:  03/13/2018 FINDINGS: Right jugular central line is again seen.  Left-sided apical pneumothorax is again noted and slightly improved when compared with the prior exam. Persistent mediastinal masses are noted stable from the prior exam. Persistent small left pleural effusion is noted as well as left basilar consolidation. IMPRESSION: Slight decrease in left-sided hydropneumothorax. Persistent mediastinal masses with evidence of left basilar consolidation. Electronically Signed   By: Inez Catalina M.D.   On: 03/14/2018 07:16   Dg Chest Bilateral Decubitus  Result Date: 03/18/2018 CLINICAL DATA:  Large mediastinal mass and moderate-sized LEFT pleural effusion (partially loculated hydropneumothorax) and small RIGHT pleural effusion. EXAM: CHEST - BILATERAL DECUBITUS VIEW COMPARISON:  Chest x-rays earlier same day and previously. FINDINGS: Very small freely layering RIGHT pleural effusion. Partially loculated LEFT hydropneumothorax, though there is a freely layering component to the LEFT pleural effusion. IMPRESSION: 1. Partially loculated LEFT hydropneumothorax though there is a freely layering component to the LEFT pleural effusion. 2. Freely layering very small RIGHT pleural effusion. Electronically Signed   By: Evangeline Dakin M.D.   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 Status post mediastinoscopy and VATS. EXAM: CT ABDOMEN AND PELVIS WITH CONTRAST TECHNIQUE: Multidetector CT imaging of the abdomen and pelvis was performed using the standard protocol following bolus administration of intravenous contrast. CONTRAST:  125mL OMNIPAQUE IOHEXOL 300 MG/ML  SOLN COMPARISON:  Recent chest CT 03/07/2018 FINDINGS: Lower chest: Persistent left pleural effusion containing air from recent surgery. There is also air in the subcutaneous tissues of the anterior chest wall. Left lower lobe atelectasis. The heart is normal in size. The distal esophagus is grossly normal. Small  right pleural effusion. The right lung base is grossly clear. Hepatobiliary: No focal hepatic lesions or intrahepatic biliary dilatation. The gallbladder is normal. No common bile duct dilatation. Pancreas: No mass, inflammation or ductal dilatation. Spleen: Normal size.  No focal lesions. Adrenals/Urinary Tract: Adrenal glands are unremarkable. Both kidneys demonstrate patchy peripheral perfusion defects typically seen with pyelonephritis. Recommend correlation with urinalysis. Stomach/Bowel: The stomach, duodenum, small bowel and colon are grossly normal. No acute inflammatory changes, mass lesions or obstructive findings. Vascular/Lymphatic: No mesenteric or retroperitoneal mass or adenopathy. The aorta and branch vessels are normal. The major venous structures are patent. Reproductive: The prostate gland and seminal vesicles are unremarkable. 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 Musculoskeletal: No significant bony findings. No worrisome bone lesions. IMPRESSION: 1. Bilateral pleural effusions, left greater than right with left lower lobe atelectasis. There is also some pleural air and subcutaneous air on the left side consistent with recent surgery. 2. No lymphadenopathy in the abdomen or pelvis. 3. Patchy perfusion abnormalities involving both kidneys most typically seen with pyelonephritis. Recommend correlation with urinalysis. 4. Body wall edema, mesenteric edema and free pelvic fluid as above. Electronically Signed   By: Marijo Sanes M.D.   On: 03/13/2018 19:32   Nm Pet Image Initial (pi) Skull Base To Thigh  Result Date: 04/03/2018 CLINICAL DATA:  Subsequent treatment strategy for non-Hodgkin's lymphoma. B-cell lymphoma. Mediastinal mass. EXAM: NUCLEAR MEDICINE PET SKULL BASE TO THIGH TECHNIQUE: 6.4 mCi F-18 FDG was injected intravenously. Full-ring  PET imaging was performed from the skull base to thigh after the radiotracer. CT data was obtained and used for attenuation correction and anatomic localization. Fasting blood glucose: 82 mg/dl COMPARISON:  Chest CT 03/07/2018 FINDINGS: Mediastinal blood pool activity: SUV max 1.37 NECK: No hypermetabolic lymph nodes in the neck. There is decreased metabolism within the LEFT vocal cord consistent with impingement upon the LEFT recurrent laryngeal nerve by the mediastinal mass. Incidental CT findings: none CHEST: There is marked decrease in volume of the bulky anterior mediastinal mass compared to CT 03/07/2018. Mass involving the LEFT hemithorax measured roughly 13 cm by 8.6 cm and now measures 7.0 cm by 6.1 cm. The residual LEFT hemithorax mass has only mild-to-moderate peripheral metabolic activity with SUV max equal 3.9. The central portion of the mass is photopenic consistent with necrosis. Likewise mass involving the superior RIGHT hemithorax is decreased in volume measuring 4.6 by 3.3 cm decreased from 8.0 by 6.7 cm. This RIGHT upper lobe mass also has only peripheral metabolic activity SUV max equal 5.1. The more intense intense activity is associated bronchiectasis and atelectasis of the RIGHT upper lobe. Centrally within the anterior mediastinum there is interval peripheral calcification of the previously ill-defined mass which occupied the anterior mediastinum. The tissue within the anterior mediastinum is mildly hypermetabolic with SUV max equal 4.4. No new or suspicious pulmonary nodules. No axillary adenopathy which is hypermetabolic. Incidental CT findings: none ABDOMEN/PELVIS: Spleen is normal size and normal metabolic activity. No hypermetabolic abdominal or pelvic lymph nodes. Liver is normal. Bowel normal. Physiologic activity noted within the testicles Incidental CT findings: None SKELETON: Mild diffuse marrow activity. No focality. The marrow activity is less than normal liver activity.  Physiologic activity noted within the testicles. Incidental CT findings: none IMPRESSION: 1. Marked reduction in volume LEFT upper lobe, mediastinal, and RIGHT upper lobe bulky masses. The residual mass lesions havemild to moderate peripheral hypermetabolic activity with central necrosis. The anterior mediastinal nodal masses are partially calcified. 2. No evidence of disease progression. 3. Normal marrow and spleen. 4. Metabolic activity in the testicles is favored physiologic. 5. 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 Electronically Signed   By: Suzy Bouchard M.D.   On: 04/03/2018 16:08   [place] Ekg Site Rite  Result Date: 04/10/2018 If Site Rite image not attached, placement could not be confirmed due to current cardiac rhythm.  [place] Ekg Site Rite  Result Date: 03/14/2018 If [facility] image not attached, placement could not  be confirmed due to current cardiac rhythm.   ASSESSMENT & PLAN:   23 y.o. 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  #2 s/p SVC compression due to mediastinal mass without overt SVC syndrome clinical symptoms #3 small acute distal left upper lobe pulmonary embolus-on lovenox #4 Acute Left IJ and Subclavian DVT due to left sided venous compression due to mass and due to malignancy -on lovenox for malignancy related thrombosis. #4 left-sided pleural effusion and left-sided lung atelectasis due to airway compression from mediastinal mass -resolved on CXR  #5 significant weight loss of 20 pounds likely due to lymphoma- resolving with treatment. #6 drenching night sweats likely has constitutional symptoms from his PMBCL - resolved  #7 Hypokalemia K 3.3  PLAN: -Discussed pt labwork today, 04/12/18; stable -The pt has no prohibitive  toxicities from continuing C2D3 EPOCH-r at this time. -Mild hypokalemia likely due to high-dose steroid use and significant hydration.  Will start on oral potassium replacement 20 mEq twice daily for 6 doses. -Continue eating well, staying hydrated, and staying as active as reasonably possible  -04/16/17 Neulasta and Rituxanas outpatient -planning for rpt PET/CT after 4 cycles to evaluate treatment response. - Senna S HS prn for constipation -Continue therapeutic Lovenox for left UE DVT and small PE -will plan to have port a cath placed prior to C3.  The total time spent in the appt was 25 minutes and more than 50% was on counseling and direct patient cares.    Sullivan Lone MD [place] AAHIVMS [facility] Hematology/Oncology Physician [facility]  (Office):       [phone] (Work cell):  [phone] (Fax):           [phone]  04/12/2018 8:34 AM

## 2018-04-12 NOTE — Progress Notes (Signed)
Chemotherapy dosage verified with Clotilde Dieter, RN.

## 2018-04-13 LAB — COMPREHENSIVE METABOLIC PANEL
ALT: 21 U/L (ref 0–44)
AST: 16 U/L (ref 15–41)
Albumin: 3.6 g/dL (ref 3.5–5.0)
Alkaline Phosphatase: 54 U/L (ref 38–126)
Anion gap: 8 (ref 5–15)
BILIRUBIN TOTAL: 0.3 mg/dL (ref 0.3–1.2)
BUN: 9 mg/dL (ref 6–20)
CO2: 28 mmol/L (ref 22–32)
Calcium: 9.1 mg/dL (ref 8.9–10.3)
Chloride: 106 mmol/L (ref 98–111)
Creatinine, Ser: 0.56 mg/dL — ABNORMAL LOW (ref 0.61–1.24)
GFR calc Af Amer: >60 mL/min (ref >60)
GFR calc non Af Amer: >60 mL/min (ref >60)
Glucose, Bld: 96 mg/dL (ref 70–99)
Potassium: 3.5 mmol/L (ref 3.5–5.1)
Sodium: 142 mmol/L (ref 135–145)
TOTAL PROTEIN: 6.1 g/dL — AB (ref 6.5–8.1)

## 2018-04-13 LAB — CBC
HEMATOCRIT: 34.5 % — AB (ref 39.0–52.0)
Hemoglobin: 10.9 g/dL — ABNORMAL LOW (ref 13.0–17.0)
MCH: 26 pg (ref 26.0–34.0)
MCHC: 31.6 g/dL (ref 30.0–36.0)
MCV: 82.1 fL (ref 80.0–100.0)
Platelets: 527 10*3/uL — ABNORMAL HIGH (ref 150–400)
RBC: 4.2 MIL/uL — ABNORMAL LOW (ref 4.22–5.81)
RDW: 18.2 % — ABNORMAL HIGH (ref 11.5–15.5)
WBC: 6.4 10*3/uL (ref 4.0–10.5)
nRBC: 0 % (ref 0.0–0.2)

## 2018-04-13 LAB — URIC ACID: Uric Acid, Serum: 5 mg/dL (ref 3.7–8.6)

## 2018-04-13 LAB — LACTATE DEHYDROGENASE: LDH: 105 U/L (ref 98–192)

## 2018-04-14 ENCOUNTER — Ambulatory Visit: Payer: BLUE CROSS/BLUE SHIELD

## 2018-04-14 ENCOUNTER — Other Ambulatory Visit: Payer: Self-pay | Admitting: Hematology

## 2018-04-14 DIAGNOSIS — C8528 Mediastinal (thymic) large B-cell lymphoma, lymph nodes of multiple sites: Secondary | ICD-10-CM

## 2018-04-14 DIAGNOSIS — C8522 Mediastinal (thymic) large B-cell lymphoma, intrathoracic lymph nodes: Secondary | ICD-10-CM

## 2018-04-14 LAB — COMPREHENSIVE METABOLIC PANEL
ALBUMIN: 3.6 g/dL (ref 3.5–5.0)
ALT: 20 U/L (ref 0–44)
AST: 15 U/L (ref 15–41)
Alkaline Phosphatase: 52 U/L (ref 38–126)
Anion gap: 8 (ref 5–15)
BILIRUBIN TOTAL: 0.9 mg/dL (ref 0.3–1.2)
BUN: 12 mg/dL (ref 6–20)
CO2: 28 mmol/L (ref 22–32)
Calcium: 8.8 mg/dL — ABNORMAL LOW (ref 8.9–10.3)
Chloride: 105 mmol/L (ref 98–111)
Creatinine, Ser: 0.59 mg/dL — ABNORMAL LOW (ref 0.61–1.24)
GFR calc Af Amer: >60 mL/min (ref >60)
Glucose, Bld: 102 mg/dL — ABNORMAL HIGH (ref 70–99)
Potassium: 3.7 mmol/L (ref 3.5–5.1)
Sodium: 141 mmol/L (ref 135–145)
Total Protein: 5.9 g/dL — ABNORMAL LOW (ref 6.5–8.1)

## 2018-04-14 LAB — CBC
HEMATOCRIT: 32.1 % — AB (ref 39.0–52.0)
Hemoglobin: 10.3 g/dL — ABNORMAL LOW (ref 13.0–17.0)
MCH: 26.4 pg (ref 26.0–34.0)
MCHC: 32.1 g/dL (ref 30.0–36.0)
MCV: 82.3 fL (ref 80.0–100.0)
Platelets: 412 10*3/uL — ABNORMAL HIGH (ref 150–400)
RBC: 3.9 MIL/uL — ABNORMAL LOW (ref 4.22–5.81)
RDW: 17.3 % — ABNORMAL HIGH (ref 11.5–15.5)
WBC: 5.6 10*3/uL (ref 4.0–10.5)
nRBC: 0 % (ref 0.0–0.2)

## 2018-04-14 MED ORDER — SODIUM CHLORIDE 0.9 % IV SOLN
750.0000 mg/m2 | Freq: Once | INTRAVENOUS | Status: AC
  Administered 2018-04-14: 1180 mg via INTRAVENOUS
  Filled 2018-04-14: qty 59

## 2018-04-14 MED ORDER — SENNOSIDES-DOCUSATE SODIUM 8.6-50 MG PO TABS: 2.0000 | ORAL_TABLET | Freq: Every evening | ORAL | 0 refills | Status: DC | PRN

## 2018-04-14 MED ORDER — SODIUM CHLORIDE 0.9 % IV SOLN
Freq: Once | INTRAVENOUS | Status: AC
  Administered 2018-04-14: 16 mg via INTRAVENOUS
  Filled 2018-04-14: qty 8

## 2018-04-14 NOTE — Discharge Summary (Signed)
.  [place]  [phone]) [phone] Fax:([phone]) [phone]    Physician Discharge Summary     Patient ID: Nicholas Moreno MRN: [id number] DOB/AGE: [birth year] 23 y.o.  Admit date: 04/10/2018 Discharge date: 04/14/2018  Primary Care Physician:  Patient, No Pcp Per   Discharge Diagnoses:    Present on Admission: . 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   enoxaparin 60 MG/0.6ML injection Commonly known as:  LOVENOX Inject 0.55 mLs (55 mg total) into the skin every 12 (twelve) hours.   fluticasone 110 MCG/ACT inhaler Commonly known as:  FLOVENT HFA Inhale 2 puffs into the lungs 2 (two) times daily as needed (asthma related symptoms).   polyethylene glycol packet Commonly known as:  MIRALAX / GLYCOLAX Take 17 g by mouth daily. What changed:    when to take this  reasons to take this   senna-docusate 8.6-50 MG tablet Commonly known as:  Senokot-S Take 2 tablets by mouth at bedtime as needed for mild constipation.   traMADol 50 MG tablet Commonly known as:  ULTRAM Take 1 tablet (50 mg total) by mouth every 6 (six) hours as needed (mild pain).        Disposition and Follow-up:   Significant Diagnostic Studies:  Dg Chest 2 View  Result Date: 04/11/2018 CLINICAL DATA:  B-cell lymphoma with malignant pleural effusion. EXAM: CHEST - 2 VIEW COMPARISON:  CT scan 04/03/2018 FINDINGS: The heart is normal in size and stable. Stable appearing mediastinal masses when compared to the recent chest CT. Significant improvement since the prior chest x-ray. No residual  left pleural effusion is identified. The right PICC line is in good position without complicating features. IMPRESSION: Right PICC line in good position, unchanged. Stable mediastinal/hilar mass is since recent chest CT. No persistent/residual left pleural effusion. Electronically Signed   By: Marijo Sanes M.D.   On: 04/11/2018 09:46   Dg Chest 2 View  Result Date: 03/18/2018 CLINICAL DATA:  23 year old who presented on 03/07/2018 with a large mediastinal mass and small LEFT upper lobe pulmonary emboli. Patient also has a small LEFT pleural effusion for which she underwent prior thoracentesis. 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 EXAM: CHEST - 2 VIEW COMPARISON:  03/14/2018 and earlier, including CTA chest 03/07/2018. FINDINGS: RIGHT arm PICC tip remains at or near the cavoatrial junction. Large mediastinal mass as noted previously. Stable moderate-sized LEFT pleural effusion, a partially loculated hydropneumothorax as there is an air-fluid level in the LATERAL pleural space. Passive atelectasis in the LEFT lung, with slight improved aeration in the LEFT LOWER LOBE since the examination 4 days ago. Stable very small RIGHT pleural effusion. No new pulmonary parenchymal abnormalities. IMPRESSION: 1. Stable large mediastinal mass. 2. Stable moderate-sized LEFT pleural effusion, a partially loculated hydropneumothorax. 3. Improved aeration in the LEFT LOWER LOBE since the examination 4 days ago. 4. No new abnormalities. Electronically Signed   By: Evangeline Dakin M.D.   On: 03/18/2018 13:28   Dg Chest Bilateral Decubitus  Result Date: 03/18/2018 CLINICAL DATA:  Large  mediastinal mass and moderate-sized LEFT pleural effusion (partially loculated hydropneumothorax) and small RIGHT pleural effusion. EXAM: CHEST - BILATERAL DECUBITUS VIEW COMPARISON:  Chest x-rays earlier same day and previously. FINDINGS: Very small freely layering RIGHT pleural effusion.  Partially loculated LEFT hydropneumothorax, though there is a freely layering component to the LEFT pleural effusion. IMPRESSION: 1. Partially loculated LEFT hydropneumothorax though there is a freely layering component to the LEFT pleural effusion. 2. Freely layering very small RIGHT pleural effusion. Electronically Signed   By: Evangeline Dakin M.D.   On: 03/18/2018 13:29   Nm Pet Image Initial (pi) Skull Base To Thigh  Result Date: 04/03/2018 CLINICAL DATA:  Subsequent treatment strategy for non-Hodgkin's lymphoma. B-cell lymphoma. Mediastinal mass. EXAM: NUCLEAR MEDICINE PET SKULL BASE TO THIGH TECHNIQUE: 6.4 mCi F-18 FDG was injected intravenously. Full-ring PET imaging was performed from the skull base to thigh after the radiotracer. CT data was obtained and used for attenuation correction and anatomic localization. Fasting blood glucose: 82 mg/dl COMPARISON:  Chest CT 03/07/2018 FINDINGS: Mediastinal blood pool activity: SUV max 1.37 NECK: No hypermetabolic lymph nodes in the neck. There is decreased metabolism within the LEFT vocal cord consistent with impingement upon the LEFT recurrent laryngeal nerve by the mediastinal mass. Incidental CT findings: none CHEST: There is marked decrease in volume of the bulky anterior mediastinal mass compared to CT 03/07/2018. Mass involving the LEFT hemithorax measured roughly 13 cm by 8.6 cm and now measures 7.0 cm by 6.1 cm. The residual LEFT hemithorax mass has only mild-to-moderate peripheral metabolic activity with SUV max equal 3.9. The central portion of the mass is photopenic consistent with necrosis. Likewise mass involving the superior RIGHT hemithorax is decreased in volume measuring 4.6 by 3.3 cm decreased from 8.0 by 6.7 cm. This RIGHT upper lobe mass also has only peripheral metabolic activity SUV max equal 5.1. The more intense intense activity is associated bronchiectasis and atelectasis of the RIGHT upper lobe. Centrally within the anterior  mediastinum there is interval peripheral calcification of the previously ill-defined mass which occupied the anterior mediastinum. The tissue within the anterior mediastinum is mildly hypermetabolic with SUV max equal 4.4. No new or suspicious pulmonary nodules. No axillary adenopathy which is hypermetabolic. Incidental CT findings: none ABDOMEN/PELVIS: Spleen is normal size and normal metabolic activity. No hypermetabolic abdominal or pelvic lymph nodes. Liver is normal. Bowel normal. Physiologic activity noted within the testicles Incidental CT findings: None SKELETON: Mild diffuse marrow activity. No focality. The marrow activity is less than normal liver activity. Physiologic activity noted within the testicles. Incidental CT findings: none IMPRESSION: 1. Marked reduction in volume LEFT upper lobe, mediastinal, and RIGHT upper lobe bulky masses. The residual mass lesions havemild to moderate peripheral hypermetabolic activity with central necrosis. The anterior mediastinal nodal masses are partially calcified. 2. No evidence of disease progression. 3. Normal marrow and spleen. 4. Metabolic activity in the testicles is favored physiologic. 5. 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 Electronically Signed   By: Suzy Bouchard M.D.   On: 04/03/2018 16:08   [place] Ekg Site Rite  Result Date: 04/10/2018 If Site Rite image not attached, placement could not be confirmed due to current cardiac rhythm.   Discharge Laboratory Values: . CBC Latest Ref Rng & Units 04/14/2018 04/13/2018 04/12/2018  WBC 4.0 - 10.5 K/uL 5.6 6.4 7.7  Hemoglobin 13.0 - 17.0 g/dL 10.3(L) 10.9(L) 10.1(L)  Hematocrit 39.0 - 52.0 % 32.1(L) 34.5(L)  31.8(L)  Platelets 150 - 400 K/uL 412(H) 527(H) 514(H)    . CMP Latest Ref Rng & Units 04/14/2018 04/13/2018 04/12/2018  Glucose 70 - 99 mg/dL 102(H) 96 109(H)  BUN 6 - 20 mg/dL 12 9 9   Creatinine 0.61 - 1.24  mg/dL 0.59(L) 0.56(L) 0.58(L)  Sodium 135 - 145 mmol/L 141 142 142  Potassium 3.5 - 5.1 mmol/L 3.7 3.5 3.3(L)  Chloride 98 - 111 mmol/L 105 106 111  CO2 22 - 32 mmol/L 28 28 24   Calcium 8.9 - 10.3 mg/dL 8.8(L) 9.1 8.4(L)  Total Protein 6.5 - 8.1 g/dL 5.9(L) 6.1(L) 5.5(L)  Total Bilirubin 0.3 - 1.2 mg/dL 0.9 0.3 0.5  Alkaline Phos 38 - 126 U/L 52 54 51  AST 15 - 41 U/L 15 16 14(L)  ALT 0 - 44 U/L 20 21 18      Brief H and P: For complete details please refer to admission H and P, but in brief, *Nicholas Moreno is a wonderful 23 y.o. male who has been admitted today for C2 EPOCH-R treatment of his Primary Mediastinal B-Cell Non-Hodgkin's Lymphoma. He is accompanied today by his father and sister at bedside. The pt reports that he is doing well overall.   The pt reports that he has not developed any new concerns. He denies swelling in his arm and notes that his breathing is improved. He is eating better again and has gained a couple pounds. The pt has continued on his blood thinners.   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  #2 s/p SVC compression due to mediastinal mass without overt SVC syndrome clinical symptoms #3 small acute distal left upper lobe pulmonary embolus-on lovenox #4 Acute Left IJ and Subclavian DVT due to left sided venous compression due to mass and due to malignancy -on lovenox for malignancy related thrombosis. #4 left-sided pleural effusion and left-sided lung atelectasis due to airway compression from mediastinal mass -resolved on CXR  #5 significant weight loss of 20 pounds likely due to lymphoma- resolving with treatment. #6 drenching night sweats likely has constitutional symptoms from his PMBCL - resolved  #7 Hypokalemia K 3.5  PLAN: -Discussed pt labwork today, 04/14/18; stable -The pt has  no prohibitive toxicities from continuing C2D5 EPOCH-r at this time. -Mild hypokalemia likely due to high-dose steroid use and significant hydration.  resolved -Continue eating well, staying hydrated, and staying as active as reasonably possible  -04/16/17 Neulasta and Rituxanas outpatient -planning for rpt PET/CT after 4 cycles to evaluate treatment response. - Senna S HS prn for constipation -Continue therapeutic Lovenox for left UE DVT and small PE -will plan to have port a cath placed prior to C3. PICC line removed prior to discharge.  Physical Exam at Discharge: BP 94/69 (BP Location: Left Arm)   Pulse (!) 54   Temp 97.6 F (36.4 C) (Oral)   Resp 20   Ht 5' 5.98" (1.676 m)   Wt 120 lb (54.4 kg)   SpO2 100%   BMI 19.38 kg/m  . GENERAL:alert, in no acute distress and comfortable SKIN: no acute rashes, no significant lesions EYES: conjunctiva are pink and non-injected, sclera anicteric OROPHARYNX: MMM, no exudates, no oropharyngeal erythema or ulceration NECK: supple, no JVD LYMPH:  no palpable lymphadenopathy in the cervical, axillary or inguinal regions LUNGS: clear to auscultation b/l with normal respiratory effort HEART: regular rate & rhythm ABDOMEN:  normoactive bowel sounds , non tender, not distended. Extremity: no pedal edema PSYCH: alert & oriented x 3 with fluent speech NEURO: no focal motor/sensory deficits      Hospital Course:  Active Problems:   Large cell lymphoma (HCC)   Hypokalemia   Diet:  Regular  Activity:    Condition at Discharge:   Increase  Activity as tolerated  Signed: Dr. Sullivan Lone MD [place] [phone]  04/14/2018, 10:41 AM   TT spent discharging patient >54mins

## 2018-04-14 NOTE — Progress Notes (Signed)
Chemotherapy dosage verified with Regina Baldwin, RN. 

## 2018-04-14 NOTE — Progress Notes (Signed)
Chemotherapy dosage verified with Clotilde Dieter, RN.

## 2018-04-15 ENCOUNTER — Other Ambulatory Visit: Payer: Self-pay | Admitting: Hematology

## 2018-04-16 ENCOUNTER — Inpatient Hospital Stay: Payer: BLUE CROSS/BLUE SHIELD | Attending: Hematology

## 2018-04-16 VITALS — BP 108/60 | HR 88 | Temp 99.2°F | Resp 17

## 2018-04-16 DIAGNOSIS — Z7189 Other specified counseling: Secondary | ICD-10-CM

## 2018-04-16 DIAGNOSIS — C8522 Mediastinal (thymic) large B-cell lymphoma, intrathoracic lymph nodes: Secondary | ICD-10-CM

## 2018-04-16 DIAGNOSIS — Z79899 Other long term (current) drug therapy: Secondary | ICD-10-CM | POA: Insufficient documentation

## 2018-04-16 DIAGNOSIS — C8528 Mediastinal (thymic) large B-cell lymphoma, lymph nodes of multiple sites: Secondary | ICD-10-CM | POA: Diagnosis present

## 2018-04-16 DIAGNOSIS — Z5189 Encounter for other specified aftercare: Secondary | ICD-10-CM | POA: Diagnosis not present

## 2018-04-16 DIAGNOSIS — Z5112 Encounter for antineoplastic immunotherapy: Secondary | ICD-10-CM | POA: Diagnosis not present

## 2018-04-16 DIAGNOSIS — I82402 Acute embolism and thrombosis of unspecified deep veins of left lower extremity: Secondary | ICD-10-CM | POA: Insufficient documentation

## 2018-04-16 DIAGNOSIS — J910 Malignant pleural effusion: Secondary | ICD-10-CM | POA: Insufficient documentation

## 2018-04-16 MED ORDER — DIPHENHYDRAMINE HCL 25 MG PO CAPS
ORAL_CAPSULE | ORAL | Status: AC
  Filled 2018-04-16: qty 2

## 2018-04-16 MED ORDER — SODIUM CHLORIDE 0.9 % IV SOLN
375.0000 mg/m2 | Freq: Once | INTRAVENOUS | Status: AC
  Administered 2018-04-16: 600 mg via INTRAVENOUS
  Filled 2018-04-16: qty 10

## 2018-04-16 MED ORDER — ACETAMINOPHEN 325 MG PO TABS
650.0000 mg | ORAL_TABLET | Freq: Once | ORAL | Status: AC
  Administered 2018-04-16: 650 mg via ORAL

## 2018-04-16 MED ORDER — SODIUM CHLORIDE 0.9 % IV SOLN
Freq: Once | INTRAVENOUS | Status: AC
  Administered 2018-04-16: 08:00:00 via INTRAVENOUS
  Filled 2018-04-16: qty 250

## 2018-04-16 MED ORDER — PEGFILGRASTIM-CBQV 6 MG/0.6ML ~~LOC~~ SOSY
6.0000 mg | PREFILLED_SYRINGE | Freq: Once | SUBCUTANEOUS | Status: AC
  Administered 2018-04-16: 6 mg via SUBCUTANEOUS

## 2018-04-16 MED ORDER — ACETAMINOPHEN 325 MG PO TABS
ORAL_TABLET | ORAL | Status: AC
  Filled 2018-04-16: qty 2

## 2018-04-16 MED ORDER — PEGFILGRASTIM-CBQV 6 MG/0.6ML ~~LOC~~ SOSY
PREFILLED_SYRINGE | SUBCUTANEOUS | Status: AC
  Filled 2018-04-16: qty 0.6

## 2018-04-16 MED ORDER — DIPHENHYDRAMINE HCL 25 MG PO CAPS
50.0000 mg | ORAL_CAPSULE | Freq: Once | ORAL | Status: AC
  Administered 2018-04-16: 50 mg via ORAL

## 2018-04-16 NOTE — Patient Instructions (Signed)
[facility] Discharge Instructions for Patients Receiving Chemotherapy  Today you received the following chemotherapy agents Rituxan  To help prevent nausea and vomiting after your treatment, we encourage you to take your nausea medication as prescribed.   If you develop nausea and vomiting that is not controlled by your nausea medication, call the clinic.   BELOW ARE SYMPTOMS THAT SHOULD BE REPORTED IMMEDIATELY:  *FEVER GREATER THAN 100.5 F  *CHILLS WITH OR WITHOUT FEVER  NAUSEA AND VOMITING THAT IS NOT CONTROLLED WITH YOUR NAUSEA MEDICATION  *UNUSUAL SHORTNESS OF BREATH  *UNUSUAL BRUISING OR BLEEDING  TENDERNESS IN MOUTH AND THROAT WITH OR WITHOUT PRESENCE OF ULCERS  *URINARY PROBLEMS  *BOWEL PROBLEMS  UNUSUAL RASH Items with * indicate a potential emergency and should be followed up as soon as possible.  Feel free to call the clinic should you have any questions or concerns. The clinic phone number is [phone].  Please show the [facility] at check-in to the Emergency Department and triage nurse.  Rituximab injection What is this medicine? RITUXIMAB (ri TUX i mab) is a monoclonal antibody. It is used to treat certain types of cancer like non-Hodgkin lymphoma and chronic lymphocytic leukemia. It is also used to treat rheumatoid arthritis, granulomatosis with polyangiitis (or Wegener's granulomatosis), microscopic polyangiitis, and pemphigus vulgaris. This medicine may be used for other purposes; ask your health care provider or pharmacist if you have questions. COMMON BRAND NAME(S): Rituxan What should I tell my health care provider before I take this medicine? They need to know if you have any of these conditions: -heart disease -infection (especially a virus infection such as hepatitis B, chickenpox, cold sores, or herpes) -immune system problems -irregular heartbeat -kidney disease -low blood counts, like low white cell, platelet, or red  cell counts -lung or breathing disease, like asthma -recently received or scheduled to receive a vaccine -an unusual or allergic reaction to rituximab, other medicines, foods, dyes, or preservatives -pregnant or trying to get pregnant -breast-feeding How should I use this medicine? This medicine is for infusion into a vein. It is administered in a hospital or clinic by a specially trained health care professional. A special MedGuide will be given to you by the pharmacist with each prescription and refill. Be sure to read this information carefully each time. Talk to your pediatrician regarding the use of this medicine in children. This medicine is not approved for use in children. Overdosage: If you think you have taken too much of this medicine contact a poison control center or emergency room at once. NOTE: This medicine is only for you. Do not share this medicine with others. What if I miss a dose? It is important not to miss a dose. Call your doctor or health care professional if you are unable to keep an appointment. What may interact with this medicine? -cisplatin -live virus vaccines This list may not describe all possible interactions. Give your health care provider a list of all the medicines, herbs, non-prescription drugs, or dietary supplements you use. Also tell them if you smoke, drink alcohol, or use illegal drugs. Some items may interact with your medicine. What should I watch for while using this medicine? Your condition will be monitored carefully while you are receiving this medicine. You may need blood work done while you are taking this medicine. This medicine can cause serious allergic reactions. To reduce your risk you may need to take medicine before treatment with this medicine. Take your medicine as directed.  In some patients, this medicine may cause a serious brain infection that may cause death. If you have any problems seeing, thinking, speaking, walking, or standing,  tell your healthcare professional right away. If you cannot reach your healthcare professional, urgently seek other source of medical care. Call your doctor or health care professional for advice if you get a fever, chills or sore throat, or other symptoms of a cold or flu. Do not treat yourself. This drug decreases your body's ability to fight infections. Try to avoid being around people who are sick. Do not become pregnant while taking this medicine or for 12 months after stopping it. Women should inform their doctor if they wish to become pregnant or think they might be pregnant. There is a potential for serious side effects to an unborn child. Talk to your health care professional or pharmacist for more information. Do not breast-feed an infant while taking this medicine or for 6 months after stopping it. What side effects may I notice from receiving this medicine? Side effects that you should report to your doctor or health care professional as soon as possible: -allergic reactions like skin rash, itching or hives; swelling of the face, lips, or tongue -breathing problems -chest pain -changes in vision -diarrhea -headache with fever, neck stiffness, sensitivity to light, nausea, or confusion -fast, irregular heartbeat -loss of memory -low blood counts - this medicine may decrease the number of white blood cells, red blood cells and platelets. You may be at increased risk for infections and bleeding. -mouth sores -problems with balance, talking, or walking -redness, blistering, peeling or loosening of the skin, including inside the mouth -signs of infection - fever or chills, cough, sore throat, pain or difficulty passing urine -signs and symptoms of kidney injury like trouble passing urine or change in the amount of urine -signs and symptoms of liver injury like dark yellow or brown urine; general ill feeling or flu-like symptoms; light-colored stools; loss of appetite; nausea; right upper  belly pain; unusually weak or tired; yellowing of the eyes or skin -signs and symptoms of low blood pressure like dizziness; feeling faint or lightheaded, falls; unusually weak or tired -stomach pain -swelling of the ankles, feet, hands -unusual bleeding or bruising -vomiting Side effects that usually do not require medical attention (report to your doctor or health care professional if they continue or are bothersome): -headache -joint pain -muscle cramps or muscle pain -nausea -tiredness This list may not describe all possible side effects. Call your doctor for medical advice about side effects. You may report side effects to FDA at 1-800-FDA-1088. Where should I keep my medicine? This drug is given in a hospital or clinic and will not be stored at home. NOTE: This sheet is a summary. It may not cover all possible information. If you have questions about this medicine, talk to your doctor, pharmacist, or health care provider.  2019 Elsevier/Gold Standard (2017-03-08 13:04:32)   Pegfilgrastim injection Ellen Henri) What is this medicine? PEGFILGRASTIM (PEG fil gra stim) is a long-acting granulocyte colony-stimulating factor that stimulates the growth of neutrophils, a type of white blood cell important in the body's fight against infection. It is used to reduce the incidence of fever and infection in patients with certain types of cancer who are receiving chemotherapy that affects the bone marrow, and to increase survival after being exposed to high doses of radiation. This medicine may be used for other purposes; ask your health care provider or pharmacist if you have questions. COMMON BRAND  NAME(S): Domenic Moras, UDENYCA What should I tell my health care provider before I take this medicine? They need to know if you have any of these conditions: -kidney disease -latex allergy -ongoing radiation therapy -sickle cell disease -skin reactions to acrylic adhesives (On-Body Injector  only) -an unusual or allergic reaction to pegfilgrastim, filgrastim, other medicines, foods, dyes, or preservatives -pregnant or trying to get pregnant -breast-feeding How should I use this medicine? This medicine is for injection under the skin. If you get this medicine at home, you will be taught how to prepare and give the pre-filled syringe or how to use the On-body Injector. Refer to the patient Instructions for Use for detailed instructions. Use exactly as directed. Tell your healthcare provider immediately if you suspect that the On-body Injector may not have performed as intended or if you suspect the use of the On-body Injector resulted in a missed or partial dose. It is important that you put your used needles and syringes in a special sharps container. Do not put them in a trash can. If you do not have a sharps container, call your pharmacist or healthcare provider to get one. Talk to your pediatrician regarding the use of this medicine in children. While this drug may be prescribed for selected conditions, precautions do apply. Overdosage: If you think you have taken too much of this medicine contact a poison control center or emergency room at once. NOTE: This medicine is only for you. Do not share this medicine with others. What if I miss a dose? It is important not to miss your dose. Call your doctor or health care professional if you miss your dose. If you miss a dose due to an On-body Injector failure or leakage, a new dose should be administered as soon as possible using a single prefilled syringe for manual use. What may interact with this medicine? Interactions have not been studied. Give your health care provider a list of all the medicines, herbs, non-prescription drugs, or dietary supplements you use. Also tell them if you smoke, drink alcohol, or use illegal drugs. Some items may interact with your medicine. This list may not describe all possible interactions. Give your health  care provider a list of all the medicines, herbs, non-prescription drugs, or dietary supplements you use. Also tell them if you smoke, drink alcohol, or use illegal drugs. Some items may interact with your medicine. What should I watch for while using this medicine? You may need blood work done while you are taking this medicine. If you are going to need a MRI, CT scan, or other procedure, tell your doctor that you are using this medicine (On-Body Injector only). What side effects may I notice from receiving this medicine? Side effects that you should report to your doctor or health care professional as soon as possible: -allergic reactions like skin rash, itching or hives, swelling of the face, lips, or tongue -back pain -dizziness -fever -pain, redness, or irritation at site where injected -pinpoint red spots on the skin -red or dark-brown urine -shortness of breath or breathing problems -stomach or side pain, or pain at the shoulder -swelling -tiredness -trouble passing urine or change in the amount of urine Side effects that usually do not require medical attention (report to your doctor or health care professional if they continue or are bothersome): -bone pain -muscle pain This list may not describe all possible side effects. Call your doctor for medical advice about side effects. You may report side effects to FDA  at 1-800-FDA-1088. Where should I keep my medicine? Keep out of the reach of children. If you are using this medicine at home, you will be instructed on how to store it. Throw away any unused medicine after the expiration date on the label. NOTE: This sheet is a summary. It may not cover all possible information. If you have questions about this medicine, talk to your doctor, pharmacist, or health care provider.  2019 Elsevier/Gold Standard (2017-07-01 16:57:08)

## 2018-04-16 NOTE — Progress Notes (Signed)
HEMATOLOGY/ONCOLOGY INPATIENT PROGRESS NOTE  Date of Service: 04/16/2018  Inpatient Attending: .No att. providers found   SUBJECTIVE:   Nicholas Moreno was seen in follow-up on the inpatient oncology unit.  He notes no acute new symptoms. He prefers to have the PICC line removed prior to discharge. No SOB/CP. Tolerating EPOCH-R C2D4 without any significant toxicities.  OBJECTIVE:  NAD  PHYSICAL EXAMINATION: . Vitals:   04/12/18 2050 04/13/18 0556 04/13/18 2008   BP:  (!) 155/92 102/64   Pulse: 91 85 80   Resp: 18 16 18    Temp: 98.4 F (36.9 C) 98 F (36.7 C) 98.5 F (36.9 C)   TempSrc: Oral Oral Oral   SpO2: 99% 100% 97%   Weight:      Height:       Filed Weights   04/10/18 1018  Weight: 120 lb (54.4 kg)   .Body mass index is 19.38 kg/m.  Marland Kitchen GENERAL:alert, in no acute distress and comfortable SKIN: no acute rashes, no significant lesions EYES: conjunctiva are pink and non-injected, sclera anicteric OROPHARYNX: MMM, no exudates, no oropharyngeal erythema or ulceration NECK: supple, no JVD LYMPH:  no palpable lymphadenopathy in the cervical, axillary or inguinal regions LUNGS: clear to auscultation b/l with normal respiratory effort HEART: regular rate & rhythm ABDOMEN:  normoactive bowel sounds , non tender, not distended. Extremity: no pedal edema PSYCH: alert & oriented x 3 with fluent speech NEURO: no focal motor/sensory deficits    MEDICAL HISTORY:  History reviewed. No pertinent past medical history.  SURGICAL HISTORY: Past Surgical History:  Procedure Laterality Date  . MEDIASTINOSCOPY N/A 03/10/2018   Procedure: MEDIASTINOSCOPY;  Surgeon: Ivin Poot, MD;  Location: [facility];  Service: Thoracic;  Laterality: N/A;  . VIDEO ASSISTED THORACOSCOPY (VATS)/ LYMPH NODE SAMPLING Left 03/10/2018   Procedure: VIDEO ASSISTED THORACOSCOPY (VATS)/ BIOSPY ANTERIOR APPROACH;  Surgeon: Ivin Poot, MD;  Location: [facility];  Service: Thoracic;  Laterality:  Left;  Marland Kitchen VIDEO BRONCHOSCOPY Left 03/10/2018   Procedure: VIDEO BRONCHOSCOPY;  Surgeon: Ivin Poot, MD;  Location: [facility] OR;  Service: Thoracic;  Laterality: Left;    SOCIAL HISTORY: Social History   Socioeconomic History  . Marital status: Single    Spouse name: Not on file  . Number of children: Not on file  . Years of education: Not on file  . Highest education level: Not on file  Occupational History  . Not on file  Social Needs  . Financial resource strain: Not on file  . Food insecurity:    Worry: Not on file    Inability: Not on file  . Transportation needs:    Medical: Not on file    Non-medical: Not on file  Tobacco Use  . Smoking status: Never Smoker  . Smokeless tobacco: Never Used  Substance and Sexual Activity  . Alcohol use: Not Currently  . Drug use: Not Currently  . Sexual activity: Not on file  Lifestyle  . Physical activity:    Days per week: Not on file    Minutes per session: Not on file  . Stress: Not on file  Relationships  . Social connections:    Talks on phone: Not on file    Gets together: Not on file    Attends religious service: Not on file    Active member of club or organization: Not on file    Attends meetings of clubs or organizations: Not on file    Relationship status: Not on file  .  Intimate partner violence:    Fear of current or ex partner: Not on file    Emotionally abused: Not on file    Physically abused: Not on file    Forced sexual activity: Not on file  Other Topics Concern  . Not on file  Social History Narrative  . Not on file    FAMILY HISTORY: History reviewed. No pertinent family history.  ALLERGIES:  has No Known Allergies.  MEDICATIONS:  Scheduled Meds:  Continuous Infusions:  PRN Meds:.  REVIEW OF SYSTEMS:    10 Point review of Systems was done is negative except as noted above.   LABORATORY DATA:  I have reviewed the data as listed  . CBC Latest Ref Rng & Units 04/13/2018 04/12/2018  WBC 4.0  - 10.5 K/uL 6.4 7.7  Hemoglobin 13.0 - 17.0 g/dL 10.9(L) 10.1(L)  Hematocrit 39.0 - 52.0 % 34.5(L) 31.8(L)  Platelets 150 - 400 K/uL 527(H) 514(H)    . CMP Latest Ref Rng & Units 04/13/2018 04/12/2018  Glucose 70 - 99 mg/dL 96 109(H)  BUN 6 - 20 mg/dL 9 9  Creatinine 0.61 - 1.24 mg/dL 0.56(L) 0.58(L)  Sodium 135 - 145 mmol/L 142 142  Potassium 3.5 - 5.1 mmol/L 3.5 3.3(L)  Chloride 98 - 111 mmol/L 106 111  CO2 22 - 32 mmol/L 28 24  Calcium 8.9 - 10.3 mg/dL 9.1 8.4(L)  Total Protein 6.5 - 8.1 g/dL 6.1(L) 5.5(L)  Total Bilirubin 0.3 - 1.2 mg/dL 0.3 0.5  Alkaline Phos 38 - 126 U/L 54 51  AST 15 - 41 U/L 16 14(L)  ALT 0 - 44 U/L 21 18    RADIOGRAPHIC STUDIES: I have personally reviewed the radiological images as listed and agreed with the findings in the report. Dg Chest 2 View  Result Date: 04/11/2018 CLINICAL DATA:  B-cell lymphoma with malignant pleural effusion. EXAM: CHEST - 2 VIEW COMPARISON:  CT scan 04/03/2018 FINDINGS: The heart is normal in size and stable. Stable appearing mediastinal masses when compared to the recent chest CT. Significant improvement since the prior chest x-ray. No residual left pleural effusion is identified. The right PICC line is in good position without complicating features. IMPRESSION: Right PICC line in good position, unchanged. Stable mediastinal/hilar mass is since recent chest CT. No persistent/residual left pleural effusion. Electronically Signed   By: Marijo Sanes M.D.   On: 04/11/2018 09:46   Dg Chest 2 View  Result Date: 03/18/2018 CLINICAL DATA:  23 year old who presented on 03/07/2018 with a large mediastinal mass and small LEFT upper lobe pulmonary emboli. Patient also has a small LEFT pleural effusion for which she underwent prior thoracentesis. 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 EXAM: CHEST - 2 VIEW COMPARISON:  03/14/2018 and earlier, including CTA chest 03/07/2018. FINDINGS: RIGHT arm PICC  tip remains at or near the cavoatrial junction. Large mediastinal mass as noted previously. Stable moderate-sized LEFT pleural effusion, a partially loculated hydropneumothorax as there is an air-fluid level in the LATERAL pleural space. Passive atelectasis in the LEFT lung, with slight improved aeration in the LEFT LOWER LOBE since the examination 4 days ago. Stable very small RIGHT pleural effusion. No new pulmonary parenchymal abnormalities. IMPRESSION: 1. Stable large mediastinal mass. 2. Stable moderate-sized LEFT pleural effusion, a partially loculated hydropneumothorax. 3. Improved aeration in the LEFT LOWER LOBE since the examination 4 days ago. 4. No new abnormalities. Electronically Signed   By: Evangeline Dakin M.D.   On: 03/18/2018 13:28  Dg Chest Bilateral Decubitus  Result Date: 03/18/2018 CLINICAL DATA:  Large mediastinal mass and moderate-sized LEFT pleural effusion (partially loculated hydropneumothorax) and small RIGHT pleural effusion. EXAM: CHEST - BILATERAL DECUBITUS VIEW COMPARISON:  Chest x-rays earlier same day and previously. FINDINGS: Very small freely layering RIGHT pleural effusion. Partially loculated LEFT hydropneumothorax, though there is a freely layering component to the LEFT pleural effusion. IMPRESSION: 1. Partially loculated LEFT hydropneumothorax though there is a freely layering component to the LEFT pleural effusion. 2. Freely layering very small RIGHT pleural effusion. Electronically Signed   By: Evangeline Dakin M.D.   On: 03/18/2018 13:29   Nm Pet Image Initial (pi) Skull Base To Thigh  Result Date: 04/03/2018 CLINICAL DATA:  Subsequent treatment strategy for non-Hodgkin's lymphoma. B-cell lymphoma. Mediastinal mass. EXAM: NUCLEAR MEDICINE PET SKULL BASE TO THIGH TECHNIQUE: 6.4 mCi F-18 FDG was injected intravenously. Full-ring PET imaging was performed from the skull base to thigh after the radiotracer. CT data was obtained and used for attenuation correction and  anatomic localization. Fasting blood glucose: 82 mg/dl COMPARISON:  Chest CT 03/07/2018 FINDINGS: Mediastinal blood pool activity: SUV max 1.37 NECK: No hypermetabolic lymph nodes in the neck. There is decreased metabolism within the LEFT vocal cord consistent with impingement upon the LEFT recurrent laryngeal nerve by the mediastinal mass. Incidental CT findings: none CHEST: There is marked decrease in volume of the bulky anterior mediastinal mass compared to CT 03/07/2018. Mass involving the LEFT hemithorax measured roughly 13 cm by 8.6 cm and now measures 7.0 cm by 6.1 cm. The residual LEFT hemithorax mass has only mild-to-moderate peripheral metabolic activity with SUV max equal 3.9. The central portion of the mass is photopenic consistent with necrosis. Likewise mass involving the superior RIGHT hemithorax is decreased in volume measuring 4.6 by 3.3 cm decreased from 8.0 by 6.7 cm. This RIGHT upper lobe mass also has only peripheral metabolic activity SUV max equal 5.1. The more intense intense activity is associated bronchiectasis and atelectasis of the RIGHT upper lobe. Centrally within the anterior mediastinum there is interval peripheral calcification of the previously ill-defined mass which occupied the anterior mediastinum. The tissue within the anterior mediastinum is mildly hypermetabolic with SUV max equal 4.4. No new or suspicious pulmonary nodules. No axillary adenopathy which is hypermetabolic. Incidental CT findings: none ABDOMEN/PELVIS: Spleen is normal size and normal metabolic activity. No hypermetabolic abdominal or pelvic lymph nodes. Liver is normal. Bowel normal. Physiologic activity noted within the testicles Incidental CT findings: None SKELETON: Mild diffuse marrow activity. No focality. The marrow activity is less than normal liver activity. Physiologic activity noted within the testicles. Incidental CT findings: none IMPRESSION: 1. Marked reduction in volume LEFT upper lobe,  mediastinal, and RIGHT upper lobe bulky masses. The residual mass lesions havemild to moderate peripheral hypermetabolic activity with central necrosis. The anterior mediastinal nodal masses are partially calcified. 2. No evidence of disease progression. 3. Normal marrow and spleen. 4. Metabolic activity in the testicles is favored physiologic. 5. 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 Electronically Signed   By: Suzy Bouchard M.D.   On: 04/03/2018 16:08   [place] Ekg Site Rite  Result Date: 04/10/2018 If Site Rite image not attached, placement could not be confirmed due to current cardiac rhythm.   ASSESSMENT & PLAN:   23 y.o. 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  #2 s/p SVC compression due to mediastinal mass without overt SVC syndrome clinical symptoms #3 small acute distal left upper lobe pulmonary embolus-on lovenox #4 Acute Left IJ and Subclavian DVT due to left sided venous compression due to mass and due to malignancy -on lovenox for malignancy related thrombosis. #4 left-sided pleural effusion and left-sided lung atelectasis due to airway compression from mediastinal mass -resolved on CXR  #5 significant weight loss of 20 pounds likely due to lymphoma- resolving with treatment. #6 drenching night sweats likely has constitutional symptoms from his PMBCL - resolved  #7 Hypokalemia K 3.5  PLAN: -Discussed pt labwork today, 04/13/18; stable -The pt has no prohibitive toxicities from continuing C2D4 EPOCH-r at this time. -Mild hypokalemia likely due to high-dose steroid use and significant hydration.  Will start on oral potassium replacement 20 mEq twice daily for 6 doses. -Continue eating well, staying hydrated, and staying as active as  reasonably possible  -04/16/17 Neulasta and Rituxanas outpatient -planning for rpt PET/CT after 4 cycles to evaluate treatment response. - Senna S HS prn for constipation -Continue therapeutic Lovenox for left UE DVT and small PE -will plan to have port a cath placed prior to C3.  . The total time spent in the appointment was 25 minutes and more than 50% was on counseling and direct patient cares.   Sullivan Lone MD [place] AAHIVMS [facility] Hematology/Oncology Physician [facility]  (Office):       [phone] (Work cell):  [phone] (Fax):           [phone]

## 2018-04-18 ENCOUNTER — Other Ambulatory Visit: Payer: Self-pay | Admitting: *Deleted

## 2018-04-18 DIAGNOSIS — C8528 Mediastinal (thymic) large B-cell lymphoma, lymph nodes of multiple sites: Secondary | ICD-10-CM

## 2018-04-18 NOTE — Progress Notes (Signed)
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 Patient notes that he started feeling unwell in August when he noted gradually increasing fatigue and weight loss.  Patient notes subsequently in September and October he developed new cough with progressive shortness of breath which triggered multiple visits with his primary care physician and was treated symptomatically and with inhalers.  Patient reports no imaging studies at the time.  Patient reports he has felt poorly during the last 2 to 3 months and during his recent visits to the [place] in [place]. He reports a total unintentional weight loss of close to 20 pounds. He also reports drenching night sweats over the last 4 to 6 weeks.  He reports that his cough and shortness of breath progressively got much more severe which led to him presenting to the emergency room yesterday. Notes nonproductive cough. No overt fevers.  Patient had a CTA of the chest on 03/07/2018 which showed  "Small acute pulmonary emboli identified within the distal left upper lobe lobar pulmonary artery. 2. 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 Primary differential considerations include lymphoma and leukemia as well as malignant derm cell tumors.  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 Narrowing and displacement of the trachea with complete occlusion of the upper lobe bronchi. There is also marked narrowing of bilateral upper lobe pulmonary arteries. 3. Significantly diminished aeration to both upper lobes, left greater than right. 4. Moderate left pleural effusion."  Patient is currently on room air and saturating 99% with stable hemodynamics. He has been started on IV heparin due to his small pulmonary embolism. 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  No headaches.  No facial swelling.  No upper extremity swelling.  No overt clinical symptomatology of SVC syndrome despite radiographic findings of some SVC narrowing.  No overt chest pain at this time.  Interval History:   Nicholas Moreno returns today for management and evaluation of his Newly diagnosed Primary mediastinal B cell Non hodgkins lymphoma. I last saw the pt with C2 EPOCH-R discharge on 04/14/18. He is accompanied today by his mother. The pt reports that he is doing well overall.  The pt reports that he has been eating well in the interim and was able to return to class after his last discharge. He did not have any bone pains after his most recent Neulasta injection. He notes that he has been breathing well and has not developed any new concerns. He denies concerns for infections.   Lab results today (04/21/18) of CBC w/diff and CMP is as follows: all values are WNL except for WBC at 14.0k, HGB at 11.3, HCT  at 35.2, RDW at 18.8, nRBC at 1.1%, ANC at 9.6k, Monocytes abs at 1.3k, Eosinophils abs at 600, Glucose at 101, ALT at 121, Alk Phos at 149. 04/21/18 LDH at 242 04/21/18 Uric acid at 7.6  On review of systems,  pt reports eating well, breathing well, urinating well, moving his bowels well, good energy levels, and denies fevers, chills, night sweats, concerns for infections, abdominal pains, leg swelling, arm swelling, SOB, mouth sores, pain over chest wall, pain along the spine, swelling at site of PICC line, nausea, and any other symptoms.   MEDICAL HISTORY:  No past medical history on file.  SURGICAL HISTORY: Past Surgical History:  Procedure Laterality Date  . MEDIASTINOSCOPY N/A 03/10/2018   Procedure: MEDIASTINOSCOPY;  Surgeon: Ivin Poot, MD;  Location: [place];  Service: Thoracic;  Laterality: N/A;  . VIDEO ASSISTED THORACOSCOPY (VATS)/ LYMPH NODE SAMPLING Left 03/10/2018   Procedure: VIDEO ASSISTED THORACOSCOPY (VATS)/ BIOSPY ANTERIOR APPROACH;  Surgeon: Ivin Poot, MD;  Location: [place];  Service: Thoracic;  Laterality: Left;  Marland Kitchen VIDEO BRONCHOSCOPY Left 03/10/2018   Procedure: VIDEO BRONCHOSCOPY;  Surgeon: Ivin Poot, MD;  Location: [facility] OR;  Service: Thoracic;  Laterality: Left;    SOCIAL HISTORY: Social History   Socioeconomic History  . Marital status: Single    Spouse name: Not on file  . Number of children: Not on file  . Years of education: Not on file  . Highest education level: Not on file  Occupational History  . Not on file  Social Needs  . Financial resource strain: Not on file  . Food insecurity:    Worry: Not on file    Inability: Not on file  . Transportation needs:    Medical: Not on file    Non-medical: Not on file  Tobacco Use  . Smoking status: Never Smoker  . Smokeless tobacco: Never Used  Substance and Sexual Activity  . Alcohol use: Not Currently  . Drug use: Not Currently  . Sexual activity: Not on file  Lifestyle  . Physical activity:    Days per week: Not on file    Minutes per session: Not on file  . Stress: Not on file  Relationships  . Social connections:    Talks on phone: Not on file    Gets together: Not on file    Attends  religious service: Not on file    Active member of club or organization: Not on file    Attends meetings of clubs or organizations: Not on file    Relationship status: Not on file  . Intimate partner violence:    Fear of current or ex partner: Not on file    Emotionally abused: Not on file    Physically abused: Not on file    Forced sexual activity: Not on file  Other Topics Concern  . Not on file  Social History Narrative  . Not on file    FAMILY HISTORY: No family history on file.  ALLERGIES:  has No Known Allergies.  MEDICATIONS:  Current Outpatient Medications  Medication Sig Dispense Refill  . albuterol (PROVENTIL HFA;VENTOLIN HFA) 108 (90 Base) MCG/ACT inhaler Inhale 2 puffs into the lungs every 4 (four) hours as needed for wheezing or shortness of breath.    . enoxaparin (LOVENOX) 60 MG/0.6ML injection Inject 0.55 mLs (55 mg total) into the skin every 12 (twelve) hours. 60 Syringe 1  . fluticasone (FLOVENT HFA) 110 MCG/ACT inhaler Inhale 2 puffs into the lungs 2 (  two) times daily as needed (asthma related symptoms).     . polyethylene glycol (MIRALAX / GLYCOLAX) packet Take 17 g by mouth daily. (Patient taking differently: Take 17 g by mouth daily as needed for mild constipation. ) 14 each 0  . senna-docusate (SENOKOT-S) 8.6-50 MG tablet Take 2 tablets by mouth at bedtime as needed for mild constipation. 60 tablet 0  . traMADol (ULTRAM) 50 MG tablet Take 1 tablet (50 mg total) by mouth every 6 (six) hours as needed (mild pain). 30 tablet 0   No current facility-administered medications for this visit.     REVIEW OF SYSTEMS:    A 10+ POINT REVIEW OF SYSTEMS WAS OBTAINED including neurology, dermatology, psychiatry, cardiac, respiratory, lymph, extremities, GI, GU, Musculoskeletal, constitutional, breasts, reproductive, HEENT.  All pertinent positives are noted in the HPI.  All others are negative.   PHYSICAL EXAMINATION: ECOG PERFORMANCE STATUS: 1 - Symptomatic but  completely ambulatory  . Vitals:   04/21/18 1134  BP: 100/68  Pulse: (!) 109  Resp: 18  Temp: 98.2 F (36.8 C)  SpO2: 100%   Filed Weights   04/21/18 1134  Weight: 124 lb 11.2 oz (56.6 kg)   .Body mass index is 20.14 kg/m.  GENERAL:alert, in no acute distress and comfortable SKIN: no acute rashes, no significant lesions EYES: conjunctiva are pink and non-injected, sclera anicteric OROPHARYNX: MMM, no exudates, no oropharyngeal erythema or ulceration NECK: supple, no JVD LYMPH:  no palpable lymphadenopathy in the cervical, axillary or inguinal regions LUNGS: clear to auscultation b/l with normal respiratory effort HEART: regular rate & rhythm ABDOMEN:  normoactive bowel sounds , non tender, not distended. No palpable hepatosplenomegaly.  Extremity: no pedal edema PSYCH: alert & oriented x 3 with fluent speech NEURO: no focal motor/sensory deficits   LABORATORY DATA:  I have reviewed the data as listed  . CBC Latest Ref Rng & Units 04/22/2018 04/21/2018 04/14/2018  WBC 4.0 - 10.5 K/uL 18.5(H) 14.0(H) 5.6  Hemoglobin 13.0 - 17.0 g/dL 10.5(L) 11.3(L) 10.3(L)  Hematocrit 39.0 - 52.0 % 33.9(L) 35.2(L) 32.1(L)  Platelets 150 - 400 K/uL 251 288 412(H)    . CMP Latest Ref Rng & Units 04/21/2018 04/14/2018 04/13/2018  Glucose 70 - 99 mg/dL 101(H) 102(H) 96  BUN 6 - 20 mg/dL _0 Creatinine 0.61 - 1.24 mg/dL 0.81 0.59(L) 0.56(L)  Sodium 135 - 145 mmol/L 139 141 142  Potassium 3.5 - 5.1 mmol/L 3.6 3.7 3.5  Chloride 98 - 111 mmol/L 103 105 106  CO2 22 - 32 mmol/L _1 Calcium 8.9 - 10.3 mg/dL 9.8 8.8(L) 9.1  Total Protein 6.5 - 8.1 g/dL 7.3 5.9(L) 6.1(L)  Total Bilirubin 0.3 - 1.2 mg/dL 0.3 0.9 0.3  Alkaline Phos 38 - 126 U/L 149(H) 52 54  AST 15 - 41 U/L 34 15 16  ALT 0 - 44 U/L 121(H) 20 21   03/10/18 Biopsy:     03/10/18 Tissue Flow Cytometry:     RADIOGRAPHIC STUDIES: I have personally reviewed the radiological images as listed and agreed with the findings  in the report. Dg Chest 2 View  Result Date: 04/11/2018 CLINICAL DATA:  B-cell lymphoma with malignant pleural effusion. EXAM: CHEST - 2 VIEW COMPARISON:  CT scan 04/03/2018 FINDINGS: The heart is normal in size and stable. Stable appearing mediastinal masses when compared to the recent chest CT. Significant improvement since the prior chest x-ray. No residual left pleural effusion is identified. The right PICC line is in  good position without complicating features. IMPRESSION: Right PICC line in good position, unchanged. Stable mediastinal/hilar mass is since recent chest CT. No persistent/residual left pleural effusion. Electronically Signed   By: Marijo Sanes M.D.   On: 04/11/2018 09:46   Nm Pet Image Initial (pi) Skull Base To Thigh  Result Date: 04/03/2018 CLINICAL DATA:  Subsequent treatment strategy for non-Hodgkin's lymphoma. B-cell lymphoma. Mediastinal mass. EXAM: NUCLEAR MEDICINE PET SKULL BASE TO THIGH TECHNIQUE: 6.4 mCi F-18 FDG was injected intravenously. Full-ring PET imaging was performed from the skull base to thigh after the radiotracer. CT data was obtained and used for attenuation correction and anatomic localization. Fasting blood glucose: 82 mg/dl COMPARISON:  Chest CT 03/07/2018 FINDINGS: Mediastinal blood pool activity: SUV max 1.37 NECK: No hypermetabolic lymph nodes in the neck. There is decreased metabolism within the LEFT vocal cord consistent with impingement upon the LEFT recurrent laryngeal nerve by the mediastinal mass. Incidental CT findings: none CHEST: There is marked decrease in volume of the bulky anterior mediastinal mass compared to CT 03/07/2018. Mass involving the LEFT hemithorax measured roughly 13 cm by 8.6 cm and now measures 7.0 cm by 6.1 cm. The residual LEFT hemithorax mass has only mild-to-moderate peripheral metabolic activity with SUV max equal 3.9. The central portion of the mass is photopenic consistent with necrosis. Likewise mass involving the superior  RIGHT hemithorax is decreased in volume measuring 4.6 by 3.3 cm decreased from 8.0 by 6.7 cm. This RIGHT upper lobe mass also has only peripheral metabolic activity SUV max equal 5.1. The more intense intense activity is associated bronchiectasis and atelectasis of the RIGHT upper lobe. Centrally within the anterior mediastinum there is interval peripheral calcification of the previously ill-defined mass which occupied the anterior mediastinum. The tissue within the anterior mediastinum is mildly hypermetabolic with SUV max equal 4.4. No new or suspicious pulmonary nodules. No axillary adenopathy which is hypermetabolic. Incidental CT findings: none ABDOMEN/PELVIS: Spleen is normal size and normal metabolic activity. No hypermetabolic abdominal or pelvic lymph nodes. Liver is normal. Bowel normal. Physiologic activity noted within the testicles Incidental CT findings: None SKELETON: Mild diffuse marrow activity. No focality. The marrow activity is less than normal liver activity. Physiologic activity noted within the testicles. Incidental CT findings: none IMPRESSION: 1. Marked reduction in volume LEFT upper lobe, mediastinal, and RIGHT upper lobe bulky masses. The residual mass lesions havemild to moderate peripheral hypermetabolic activity with central necrosis. The anterior mediastinal nodal masses are partially calcified. 2. No evidence of disease progression. 3. Normal marrow and spleen. 4. Metabolic activity in the testicles is favored physiologic. 5. 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 Electronically Signed   By: Suzy Bouchard M.D.   On: 04/03/2018 16:08   [place] Ekg Site Rite  Result Date: 04/10/2018 If Site Rite image not attached, placement could not be confirmed due to current cardiac rhythm.   ASSESSMENT & PLAN:  23 y.o. male with  #1Recently diagnosed Primary Mediastinal B cell Non Hodgkins  lymphoma c-MYC negative Not a double hit lymphoma Pathology confirmed by pathology read at [facility].  #2 s/p SVC compression due to mediastinal mass without overt SVC syndrome clinical symptoms #3 small acute distal left upper lobe pulmonary embolus-on lovenox #4 Acute Left IJ and Subclavian DVT due to left sided venous compression due to mass and due to malignancy -on lovenox for malignancy related thrombosis. #4 left-sided pleural effusion and left-sided lung atelectasis  due to airway compression from mediastinal mass -resolved on CXR  #5 significant weight loss of 20 pounds likely due to lymphoma- resolving with treatment. #6 drenching night sweats likely has constitutional symptoms from his PMBCL - resolved  #7 Hypokalemia- resolved 04/21/18 K at 3.6  PLAN: -Discussed pt labwork today, 04/21/18; ANC at 9.6k with Neulasta, HGB improved to 11.3, PLT normalized to 288k.  - ALT at 121 - likely from chemo -- will monitor  -No prohibitive toxicities from recent C2 EPOCH-R treatment -Discussed that as the patient is tolerating treatment very well so far, would recommend one level dose escalation of EPOCH-R  With C3 -Proceed with port placement tomorrow and hold morning dose of Lovenox tomorrow 04/22/18 -Will switch pt to Eliquis upon C3 EPOCH-R admission on 04/28/18 -Continue eating very well and staying very hydrated -Recommend salt and baking soda mouthwashes 4-5 times per day -planning for rpt PET/CT after 4 cycles to evaluate treatment response. - Senna S HS prn for constipation -Will see pt again on C3D12 in clinic for nadir check   -F/u for port placement as scheduled tomorrow. -Admit for C3 of EPOCH to inpatient oncology from 04/28/2018 for 5 days -plz schedule outpatient Rituxan and neulasta on 05/05/2018 -RTC with dr Irene Limbo with labs on 2/3 or 05/13/2018 with labs   All of the patients questions were answered with apparent satisfaction. The patient knows to call the  clinic with any problems, questions or concerns.  The total time spent in the appt was 30 minutes and more than 50% was on counseling and direct patient cares.    Sullivan Lone MD MS AAHIVMS [facility] Hematology/Oncology Physician [facility]  (Office):       [phone] (Work cell):  [phone] (Fax):           [phone]  04/21/2018 12:38 PM  I, Baldwin Jamaica, am acting as a scribe for Dr. Sullivan Lone.   .I have reviewed the above documentation for accuracy and completeness, and I agree with the above. Brunetta Genera MD

## 2018-04-21 ENCOUNTER — Telehealth: Payer: Self-pay | Admitting: Hematology

## 2018-04-21 ENCOUNTER — Inpatient Hospital Stay: Payer: BLUE CROSS/BLUE SHIELD

## 2018-04-21 ENCOUNTER — Other Ambulatory Visit: Payer: Self-pay | Admitting: Radiology

## 2018-04-21 ENCOUNTER — Inpatient Hospital Stay (HOSPITAL_BASED_OUTPATIENT_CLINIC_OR_DEPARTMENT_OTHER): Payer: BLUE CROSS/BLUE SHIELD | Admitting: Hematology

## 2018-04-21 VITALS — BP 100/68 | HR 109 | Temp 98.2°F | Resp 18 | Ht 65.98 in | Wt 124.7 lb

## 2018-04-21 DIAGNOSIS — C8528 Mediastinal (thymic) large B-cell lymphoma, lymph nodes of multiple sites: Secondary | ICD-10-CM

## 2018-04-21 DIAGNOSIS — E876 Hypokalemia: Secondary | ICD-10-CM | POA: Diagnosis not present

## 2018-04-21 DIAGNOSIS — I82622 Acute embolism and thrombosis of deep veins of left upper extremity: Secondary | ICD-10-CM | POA: Diagnosis not present

## 2018-04-21 LAB — CBC WITH DIFFERENTIAL (CANCER CENTER ONLY)
Abs Immature Granulocytes: 1.38 10*3/uL — ABNORMAL HIGH (ref 0.00–0.07)
Basophils Absolute: 0.1 10*3/uL (ref 0.0–0.1)
Basophils Relative: 1 %
Eosinophils Absolute: 0.6 10*3/uL — ABNORMAL HIGH (ref 0.0–0.5)
Eosinophils Relative: 4 %
HCT: 35.2 % — ABNORMAL LOW (ref 39.0–52.0)
Hemoglobin: 11.3 g/dL — ABNORMAL LOW (ref 13.0–17.0)
IMMATURE GRANULOCYTES: 10 %
LYMPHS ABS: 1 10*3/uL (ref 0.7–4.0)
Lymphocytes Relative: 7 %
MCH: 26.7 pg (ref 26.0–34.0)
MCHC: 32.1 g/dL (ref 30.0–36.0)
MCV: 83.2 fL (ref 80.0–100.0)
Monocytes Absolute: 1.3 10*3/uL — ABNORMAL HIGH (ref 0.1–1.0)
Monocytes Relative: 9 %
Neutro Abs: 9.6 10*3/uL — ABNORMAL HIGH (ref 1.7–7.7)
Neutrophils Relative %: 69 %
Platelet Count: 288 10*3/uL (ref 150–400)
RBC: 4.23 MIL/uL (ref 4.22–5.81)
RDW: 18.8 % — ABNORMAL HIGH (ref 11.5–15.5)
WBC Count: 14 10*3/uL — ABNORMAL HIGH (ref 4.0–10.5)
WBC Morphology: INCREASED
nRBC: 1.1 % — ABNORMAL HIGH (ref 0.0–0.2)

## 2018-04-21 LAB — CMP (CANCER CENTER ONLY)
ALT: 121 U/L — ABNORMAL HIGH (ref 0–44)
AST: 34 U/L (ref 15–41)
Albumin: 4.1 g/dL (ref 3.5–5.0)
Alkaline Phosphatase: 149 U/L — ABNORMAL HIGH (ref 38–126)
Anion gap: 8 (ref 5–15)
BUN: 12 mg/dL (ref 6–20)
CO2: 28 mmol/L (ref 22–32)
Calcium: 9.8 mg/dL (ref 8.9–10.3)
Chloride: 103 mmol/L (ref 98–111)
Creatinine: 0.81 mg/dL (ref 0.61–1.24)
GFR, Est AFR Am: >60 mL/min (ref >60)
GFR, Estimated: >60 mL/min (ref >60)
Glucose, Bld: 101 mg/dL — ABNORMAL HIGH (ref 70–99)
Potassium: 3.6 mmol/L (ref 3.5–5.1)
SODIUM: 139 mmol/L (ref 135–145)
Total Bilirubin: 0.3 mg/dL (ref 0.3–1.2)
Total Protein: 7.3 g/dL (ref 6.5–8.1)

## 2018-04-21 LAB — LACTATE DEHYDROGENASE: LDH: 242 U/L — ABNORMAL HIGH (ref 98–192)

## 2018-04-21 LAB — URIC ACID: Uric Acid, Serum: 7.6 mg/dL (ref 3.7–8.6)

## 2018-04-21 NOTE — Patient Instructions (Signed)
Thank you for choosing [facility] to provide your oncology and hematology care.  To afford each patient quality time with our providers, please arrive 30 minutes before your scheduled appointment time.  If you arrive late for your appointment, you may be asked to reschedule.  We strive to give you quality time with our providers, and arriving late affects you and other patients whose appointments are after yours.    If you are a no show for multiple scheduled visits, you may be dismissed from the clinic at the providers discretion.     Again, thank you for choosing [facility], our hope is that these requests will decrease the amount of time that you wait before being seen by our physicians.  ______________________________________________________________________   Should you have questions after your visit to the [facility], please contact our office at [phone] between the hours of 8:30 and 4:30 p.m.    Voicemails left after 4:30p.m will not be returned until the following business day.     For prescription refill requests, please have your pharmacy contact us directly.  Please also try to allow 48 hours for prescription requests.     Please contact the scheduling department for questions regarding scheduling.  For scheduling of procedures such as PET scans, CT scans, MRI, Ultrasound, etc please contact central scheduling at [phone].     Resources For Cancer Patients and Caregivers:    Oncolink.org:  A wonderful resource for patients and healthcare providers for information regarding your disease, ways to tract your treatment, what to expect, etc.      American Cancer Society:  800-227-2345  Can help patients locate various types of support and financial assistance   Cancer Care: 1-800-813-HOPE (4673) Provides financial assistance, online support groups, medication/co-pay assistance.     [place] DSS:  336-641-3447 Where to apply  for food stamps, Medicaid, and utility assistance   Medicare Rights Center: 800-333-4114 Helps people with Medicare understand their rights and benefits, navigate the Medicare system, and secure the quality healthcare they deserve   SCAT: 336-333-6589 [place] [employer]'s shared-ride transportation service for eligible riders who have a disability that prevents them from riding the fixed route bus.     For additional information on assistance programs please contact our social worker:   Abigail Elmore:  [phone]  

## 2018-04-21 NOTE — Telephone Encounter (Signed)
Printed avs. °

## 2018-04-22 ENCOUNTER — Ambulatory Visit (HOSPITAL_COMMUNITY)
Admission: RE | Admit: 2018-04-22 | Discharge: 2018-04-22 | Disposition: A | Discharge status: Home / Self Care | Payer: BLUE CROSS/BLUE SHIELD | Source: Ambulatory Visit | Attending: Hematology | Admitting: Hematology

## 2018-04-22 ENCOUNTER — Encounter (HOSPITAL_COMMUNITY): Payer: Self-pay

## 2018-04-22 DIAGNOSIS — Z79899 Other long term (current) drug therapy: Secondary | ICD-10-CM | POA: Insufficient documentation

## 2018-04-22 DIAGNOSIS — Z7901 Long term (current) use of anticoagulants: Secondary | ICD-10-CM | POA: Diagnosis not present

## 2018-04-22 DIAGNOSIS — C8528 Mediastinal (thymic) large B-cell lymphoma, lymph nodes of multiple sites: Secondary | ICD-10-CM | POA: Diagnosis not present

## 2018-04-22 DIAGNOSIS — Z86718 Personal history of other venous thrombosis and embolism: Secondary | ICD-10-CM | POA: Insufficient documentation

## 2018-04-22 HISTORY — PX: IR IMAGING GUIDED PORT INSERTION: IMG5740

## 2018-04-22 LAB — CBC WITH DIFFERENTIAL/PLATELET
Abs Immature Granulocytes: 2.61 10*3/uL — ABNORMAL HIGH (ref 0.00–0.07)
Basophils Absolute: 0.1 10*3/uL (ref 0.0–0.1)
Basophils Relative: 1 %
Eosinophils Absolute: 0.5 10*3/uL (ref 0.0–0.5)
Eosinophils Relative: 2 %
HEMATOCRIT: 33.9 % — AB (ref 39.0–52.0)
Hemoglobin: 10.5 g/dL — ABNORMAL LOW (ref 13.0–17.0)
Immature Granulocytes: 14 %
Lymphocytes Relative: 7 %
Lymphs Abs: 1.2 10*3/uL (ref 0.7–4.0)
MCH: 26.9 pg (ref 26.0–34.0)
MCHC: 31 g/dL (ref 30.0–36.0)
MCV: 86.7 fL (ref 80.0–100.0)
MONO ABS: 1.7 10*3/uL — AB (ref 0.1–1.0)
Monocytes Relative: 9 %
Neutro Abs: 12.4 10*3/uL — ABNORMAL HIGH (ref 1.7–7.7)
Neutrophils Relative %: 67 %
Platelets: 251 10*3/uL (ref 150–400)
RBC: 3.91 MIL/uL — ABNORMAL LOW (ref 4.22–5.81)
RDW: 19.8 % — ABNORMAL HIGH (ref 11.5–15.5)
WBC: 18.5 10*3/uL — ABNORMAL HIGH (ref 4.0–10.5)
nRBC: 1.5 % — ABNORMAL HIGH (ref 0.0–0.2)

## 2018-04-22 LAB — PROTIME-INR
INR: 0.84
Prothrombin Time: 11.4 seconds (ref 11.4–15.2)

## 2018-04-22 MED ORDER — HEPARIN SOD (PORK) LOCK FLUSH 100 UNIT/ML IV SOLN
INTRAVENOUS | Status: AC
  Filled 2018-04-22: qty 5

## 2018-04-22 MED ORDER — MIDAZOLAM HCL 2 MG/2ML IJ SOLN
INTRAMUSCULAR | Status: AC | PRN
  Administered 2018-04-22: 1 mg via INTRAVENOUS
  Administered 2018-04-22: 2 mg via INTRAVENOUS
  Administered 2018-04-22: 1 mg via INTRAVENOUS

## 2018-04-22 MED ORDER — SODIUM CHLORIDE 0.9 % IV SOLN
INTRAVENOUS | Status: DC
  Administered 2018-04-22: 13:00:00 via INTRAVENOUS

## 2018-04-22 MED ORDER — MIDAZOLAM HCL 2 MG/2ML IJ SOLN
INTRAMUSCULAR | Status: AC
  Filled 2018-04-22: qty 4

## 2018-04-22 MED ORDER — HEPARIN SOD (PORK) LOCK FLUSH 100 UNIT/ML IV SOLN
INTRAVENOUS | Status: AC | PRN
  Administered 2018-04-22: 500 [IU]

## 2018-04-22 MED ORDER — FENTANYL CITRATE (PF) 100 MCG/2ML IJ SOLN
INTRAMUSCULAR | Status: AC | PRN
  Administered 2018-04-22 (×3): 50 ug via INTRAVENOUS

## 2018-04-22 MED ORDER — FENTANYL CITRATE (PF) 100 MCG/2ML IJ SOLN
INTRAMUSCULAR | Status: AC
  Filled 2018-04-22: qty 4

## 2018-04-22 MED ORDER — CEFAZOLIN SODIUM-DEXTROSE 2-4 GM/100ML-% IV SOLN
INTRAVENOUS | Status: AC
  Administered 2018-04-22: 2 g via INTRAVENOUS
  Filled 2018-04-22: qty 100

## 2018-04-22 MED ORDER — CEFAZOLIN SODIUM-DEXTROSE 2-4 GM/100ML-% IV SOLN
2.0000 g | INTRAVENOUS | Status: AC
  Administered 2018-04-22: 2 g via INTRAVENOUS

## 2018-04-22 MED ORDER — LIDOCAINE-EPINEPHRINE (PF) 2 %-1:200000 IJ SOLN
INTRAMUSCULAR | Status: AC | PRN
  Administered 2018-04-22 (×2): 10 mL

## 2018-04-22 MED ORDER — LIDOCAINE HCL 1 % IJ SOLN
INTRAMUSCULAR | Status: AC
  Filled 2018-04-22: qty 20

## 2018-04-22 MED ORDER — LIDOCAINE-EPINEPHRINE (PF) 2 %-1:200000 IJ SOLN
INTRAMUSCULAR | Status: AC
  Filled 2018-04-22: qty 20

## 2018-04-22 NOTE — Procedures (Signed)
Interventional Radiology Procedure Note  Procedure: Single Lumen Power Port Placement    Access:  Right IJ vein.  Findings: Catheter tip positioned at SVC/RA junction. Port is ready for immediate use.   Complications: None  EBL: < 10 mL  Recommendations:  - Ok to shower in 24 hours - Do not submerge for 7 days - Routine line care   Luvada Salamone T. Feliciano Wynter, M.D Pager:  [phone]   

## 2018-04-22 NOTE — Sedation Documentation (Signed)
Patient is resting comfortably with eyes closed in NAD. 

## 2018-04-22 NOTE — Discharge Instructions (Addendum)
You may remove your dressing and shower tomorrow. ° °DO NOT use EMLA cream for 2 weeks after port placement as this cream will remove surgical glue on your incision. ° °Implanted Port Home Guide °An implanted port is a device that is placed under the skin. It is usually placed in the chest. The device can be used to give IV medicine, to take blood, or for dialysis. You may have an implanted port if: °· You need IV medicine that would be irritating to the small veins in your hands or arms. °· You need IV medicines, such as antibiotics, for a long period of time. °· You need IV nutrition for a long period of time. °· You need dialysis. °Having a port means that your health care provider will not need to use the veins in your arms for these procedures. You may have fewer limitations when using a port than you would if you used other types of long-term IVs, and you will likely be able to return to normal activities after your incision heals. °An implanted port has two main parts: °· Reservoir. The reservoir is the part where a needle is inserted to give medicines or draw blood. The reservoir is round. After it is placed, it appears as a small, raised area under your skin. °· Catheter. The catheter is a thin, flexible tube that connects the reservoir to a vein. Medicine that is inserted into the reservoir goes into the catheter and then into the vein. °How is my port accessed? °To access your port: °· A numbing cream may be placed on the skin over the port site. °· Your health care provider will put on a mask and sterile gloves. °· The skin over your port will be cleaned carefully with a germ-killing soap and allowed to dry. °· Your health care provider will gently pinch the port and insert a needle into it. °· Your health care provider will check for a blood return to make sure the port is in the vein and is not clogged. °· If your port needs to remain accessed to get medicine continuously (constant infusion), your  health care provider will place a clear bandage (dressing) over the needle site. The dressing and needle will need to be changed every week, or as told by your health care provider. °What is flushing? °Flushing helps keep the port from getting clogged. Follow instructions from your health care provider about how and when to flush the port. Ports are usually flushed with saline solution or a medicine called heparin. The need for flushing will depend on how the port is used: °· If the port is only used from time to time to give medicines or draw blood, the port may need to be flushed: °? Before and after medicines have been given. °? Before and after blood has been drawn. °? As part of routine maintenance. Flushing may be recommended every 4-6 weeks. °· If a constant infusion is running, the port may not need to be flushed. °· Throw away any syringes in a disposal container that is meant for sharp items (sharps container). You can buy a sharps container from a pharmacy, or you can make one by using an empty hard plastic bottle with a cover. °How long will my port stay implanted? °The port can stay in for as long as your health care provider thinks it is needed. When it is time for the port to come out, a surgery will be done to remove it. The surgery   will be similar to the procedure that was done to put the port in. °Follow these instructions at home: ° °· Flush your port as told by your health care provider. °· If you need an infusion over several days, follow instructions from your health care provider about how to take care of your port site. Make sure you: °? Wash your hands with soap and water before you change your dressing. If soap and water are not available, use alcohol-based hand sanitizer. °? Change your dressing as told by your health care provider. °? Place any used dressings or infusion bags into a plastic bag. Throw that bag in the trash. °? Keep the dressing that covers the needle clean and dry. Do not  get it wet. °? Do not use scissors or sharp objects near the tube. °? Keep the tube clamped, unless it is being used. °· Check your port site every day for signs of infection. Check for: °? Redness, swelling, or pain. °? Fluid or blood. °? Pus or a bad smell. °· Protect the skin around the port site. °? Avoid wearing bra straps that rub or irritate the site. °? Protect the skin around your port from seat belts. Place a soft pad over your chest if needed. °· Bathe or shower as told by your health care provider. The site may get wet as long as you are not actively receiving an infusion. °· Return to your normal activities as told by your health care provider. Ask your health care provider what activities are safe for you. °· Carry a medical alert card or wear a medical alert bracelet at all times. This will let health care providers know that you have an implanted port in case of an emergency. °Get help right away if: °· You have redness, swelling, or pain at the port site. °· You have fluid or blood coming from your port site. °· You have pus or a bad smell coming from the port site. °· You have a fever. °Summary °· Implanted ports are usually placed in the chest for long-term IV access. °· Follow instructions from your health care provider about flushing the port and changing bandages (dressings). °· Take care of the area around your port by avoiding clothing that puts pressure on the area, and by watching for signs of infection. °· Protect the skin around your port from seat belts. Place a soft pad over your chest if needed. °· Get help right away if you have a fever or you have redness, swelling, pain, drainage, or a bad smell at the port site. °This information is not intended to replace advice given to you by your health care provider. Make sure you discuss any questions you have with your health care provider. °Document Released: 03/26/2005 Document Revised: 04/28/2016 Document Reviewed: 04/28/2016 °Elsevier  Interactive Patient Education © 2019 [employer] ° ° °Moderate Conscious Sedation, Adult, Care After °These instructions provide you with information about caring for yourself after your procedure. Your health care provider may also give you more specific instructions. Your treatment has been planned according to current medical practices, but problems sometimes occur. Call your health care provider if you have any problems or questions after your procedure. °What can I expect after the procedure? °After your procedure, it is common: °· To feel sleepy for several hours. °· To feel clumsy and have poor balance for several hours. °· To have poor judgment for several hours. °· To vomit if you eat too soon. °Follow these   instructions at home: °For at least 24 hours after the procedure: ° °· Do not: °? Participate in activities where you could fall or become injured. °? Drive. °? Use heavy machinery. °? Drink alcohol. °? Take sleeping pills or medicines that cause drowsiness. °? Make important decisions or sign legal documents. °? Take care of children on your own. °· Rest. °Eating and drinking °· Follow the diet recommended by your health care provider. °· If you vomit: °? Drink water, juice, or soup when you can drink without vomiting. °? Make sure you have little or no nausea before eating solid foods. °General instructions °· Have a responsible adult stay with you until you are awake and alert. °· Take over-the-counter and prescription medicines only as told by your health care provider. °· If you smoke, do not smoke without supervision. °· Keep all follow-up visits as told by your health care provider. This is important. °Contact a health care provider if: °· You keep feeling nauseous or you keep vomiting. °· You feel light-headed. °· You develop a rash. °· You have a fever. °Get help right away if: °· You have trouble breathing. °This information is not intended to replace advice given to you by your health care  provider. Make sure you discuss any questions you have with your health care provider. °Document Released: 01/14/2013 Document Revised: 08/29/2015 Document Reviewed: 07/16/2015 °Elsevier Interactive Patient Education © 2019 [employer] ° °

## 2018-04-22 NOTE — Consult Note (Addendum)
Chief Complaint: Patient was seen in consultation today for Port-A-Cath placement   Referring Physician(s): Brunetta Genera  Supervising Physician: Aletta Edouard  Patient Status: Nicholas Moreno  History of Present Illness: Nicholas Moreno is a 23 y.o. 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 Also with hx small PE 02/2018.   He presents today for Port-A-Cath placement for chemotherapy.  History reviewed. No pertinent past medical history.  Past Surgical History:  Procedure Laterality Date  . MEDIASTINOSCOPY N/A 03/10/2018   Procedure: MEDIASTINOSCOPY;  Surgeon: Ivin Poot, MD;  Location: [facility];  Service: Thoracic;  Laterality: N/A;  . VIDEO ASSISTED THORACOSCOPY (VATS)/ LYMPH NODE SAMPLING Left 03/10/2018   Procedure: VIDEO ASSISTED THORACOSCOPY (VATS)/ BIOSPY ANTERIOR APPROACH;  Surgeon: Ivin Poot, MD;  Location: [facility];  Service: Thoracic;  Laterality: Left;  Marland Kitchen VIDEO BRONCHOSCOPY Left 03/10/2018   Procedure: VIDEO BRONCHOSCOPY;  Surgeon: Ivin Poot, MD;  Location: [facility];  Service: Thoracic;  Laterality: Left;    Allergies: Patient has no known allergies.  Medications: Prior to Admission medications   Medication Sig Start Date End Date Taking? Authorizing Provider  albuterol (PROVENTIL HFA;VENTOLIN HFA) 108 (90 Base) MCG/ACT inhaler Inhale 2 puffs into the lungs every 4 (four) hours as needed for wheezing or shortness of breath.    [provider]  enoxaparin (LOVENOX) 60 MG/0.6ML injection Inject 0.55 mLs (55 mg total) into the skin every 12 (twelve) hours. 03/19/18   Brunetta Genera, MD  fluticasone (FLOVENT HFA) 110 MCG/ACT inhaler Inhale 2 puffs into the lungs 2 (two) times daily as needed (asthma related symptoms).     [provider]  polyethylene glycol (MIRALAX / GLYCOLAX) packet Take 17 g by mouth  daily. Patient taking differently: Take 17 g by mouth daily as needed for mild constipation.  03/20/18   Brunetta Genera, MD  senna-docusate (SENOKOT-S) 8.6-50 MG tablet Take 2 tablets by mouth at bedtime as needed for mild constipation. 04/14/18   Brunetta Genera, MD  traMADol (ULTRAM) 50 MG tablet Take 1 tablet (50 mg total) by mouth every 6 (six) hours as needed (mild pain). 03/19/18   Brunetta Genera, MD     History reviewed. No pertinent family history.  Social History   Socioeconomic History  . Marital status: Single    Spouse name: Not on file  . Number of children: Not on file  . Years of education: Not on file  . Highest education level: Not on file  Occupational History  . Not on file  Social Needs  . Financial resource strain: Not on file  . Food insecurity:    Worry: Not on file    Inability: Not on file  . Transportation needs:    Medical: Not on file    Non-medical: Not on file  Tobacco Use  . Smoking status: Never Smoker  . Smokeless tobacco: Never Used  Substance and Sexual Activity  . Alcohol use: Not Currently  . Drug use: Not Currently  . Sexual activity: Not on file  Lifestyle  . Physical activity:    Days per week: Not on file    Minutes per session: Not on file  . Stress: Not on file  Relationships  . Social connections:    Talks on phone: Not on file    Gets together: Not on file    Attends religious service: Not  on file    Active member of club or organization: Not on file    Attends meetings of clubs or organizations: Not on file    Relationship status: Not on file  Other Topics Concern  . Not on file  Social History Narrative  . Not on file      Review of Systems currently denies fever, headache, chest pain, dyspnea, cough, abdominal/back pain, nausea, vomiting or bleeding.  Vital Signs: BP 115/73 (BP Location: Right Arm)   Pulse 86   Temp 98.2 F (36.8 C) (Oral)   Resp 18   SpO2 99%   Physical Exam awake, alert.   Chest clear to auscultation bilaterally.  Heart with regular rate and rhythm.  Abdomen soft, positive bowel sounds, nontender.  No lower extremity edema.  Imaging: Dg Chest 2 View  Result Date: 04/11/2018 CLINICAL DATA:  B-cell lymphoma with malignant pleural effusion. EXAM: CHEST - 2 VIEW COMPARISON:  CT scan 04/03/2018 FINDINGS: The heart is normal in size and stable. Stable appearing mediastinal masses when compared to the recent chest CT. Significant improvement since the prior chest x-ray. No residual left pleural effusion is identified. The right PICC line is in good position without complicating features. IMPRESSION: Right PICC line in good position, unchanged. Stable mediastinal/hilar mass is since recent chest CT. No persistent/residual left pleural effusion. Electronically Signed   By: Marijo Sanes M.D.   On: 04/11/2018 09:46   Nm Pet Image Initial (pi) Skull Base To Thigh  Result Date: 04/03/2018 CLINICAL DATA:  Subsequent treatment strategy for non-Hodgkin's lymphoma. B-cell lymphoma. Mediastinal mass. EXAM: NUCLEAR MEDICINE PET SKULL BASE TO THIGH TECHNIQUE: 6.4 mCi F-18 FDG was injected intravenously. Full-ring PET imaging was performed from the skull base to thigh after the radiotracer. CT data was obtained and used for attenuation correction and anatomic localization. Fasting blood glucose: 82 mg/dl COMPARISON:  Chest CT 03/07/2018 FINDINGS: Mediastinal blood pool activity: SUV max 1.37 NECK: No hypermetabolic lymph nodes in the neck. There is decreased metabolism within the LEFT vocal cord consistent with impingement upon the LEFT recurrent laryngeal nerve by the mediastinal mass. Incidental CT findings: none CHEST: There is marked decrease in volume of the bulky anterior mediastinal mass compared to CT 03/07/2018. Mass involving the LEFT hemithorax measured roughly 13 cm by 8.6 cm and now measures 7.0 cm by 6.1 cm. The residual LEFT hemithorax mass has only mild-to-moderate peripheral  metabolic activity with SUV max equal 3.9. The central portion of the mass is photopenic consistent with necrosis. Likewise mass involving the superior RIGHT hemithorax is decreased in volume measuring 4.6 by 3.3 cm decreased from 8.0 by 6.7 cm. This RIGHT upper lobe mass also has only peripheral metabolic activity SUV max equal 5.1. The more intense intense activity is associated bronchiectasis and atelectasis of the RIGHT upper lobe. Centrally within the anterior mediastinum there is interval peripheral calcification of the previously ill-defined mass which occupied the anterior mediastinum. The tissue within the anterior mediastinum is mildly hypermetabolic with SUV max equal 4.4. No new or suspicious pulmonary nodules. No axillary adenopathy which is hypermetabolic. Incidental CT findings: none ABDOMEN/PELVIS: Spleen is normal size and normal metabolic activity. No hypermetabolic abdominal or pelvic lymph nodes. Liver is normal. Bowel normal. Physiologic activity noted within the testicles Incidental CT findings: None SKELETON: Mild diffuse marrow activity. No focality. The marrow activity is less than normal liver activity. Physiologic activity noted within the testicles. Incidental CT findings: none IMPRESSION: 1. Marked reduction in volume LEFT upper lobe, mediastinal,  and RIGHT upper lobe bulky masses. The residual mass lesions havemild to moderate peripheral hypermetabolic activity with central necrosis. The anterior mediastinal nodal masses are partially calcified. 2. No evidence of disease progression. 3. Normal marrow and spleen. 4. Metabolic activity in the testicles is favored physiologic. 5. 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 Electronically Signed   By: Suzy Bouchard M.D.   On: 04/03/2018 16:08   [place] Ekg Site Rite  Result Date: 04/10/2018 If Site Rite image not attached, placement could not be  confirmed due to current cardiac rhythm.   Labs:  CBC: Recent Labs    04/12/18 0500 04/13/18 0454 04/14/18 0419 04/21/18 1103  WBC 7.7 6.4 5.6 14.0*  HGB 10.1* 10.9* 10.3* 11.3*  HCT 31.8* 34.5* 32.1* 35.2*  PLT 514* 527* 412* 288    COAGS: Recent Labs    03/10/18 0143  INR 1.09    BMP: Recent Labs    04/12/18 0500 04/13/18 0454 04/14/18 0419 04/21/18 1103  NA 142 142 141 139  K 3.3* 3.5 3.7 3.6  CL 111 106 105 103  CO2 24 28 28 28   GLUCOSE 109* 96 102* 101*  BUN 9 9 12 12   CALCIUM 8.4* 9.1 8.8* 9.8  CREATININE 0.58* 0.56* 0.59* 0.81  GFRNONAA >60 >60 >60 >60  GFRAA >60 >60 >60 >60    LIVER FUNCTION TESTS: Recent Labs    04/12/18 0500 04/13/18 0454 04/14/18 0419 04/21/18 1103  BILITOT 0.5 0.3 0.9 0.3  AST 14* 16 15 34  ALT 18 21 20  121*  ALKPHOS 51 54 52 149*  PROT 5.5* 6.1* 5.9* 7.3  ALBUMIN 3.4* 3.6 3.6 4.1    TUMOR MARKERS: No results for input(s): AFPTM, CEA, CA199, CHROMGRNA in the last 8760 hours.  Assessment and Plan: 23 y.o. 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  Also with hx small PE 02/2018.He presents today for Port-A-Cath placement for chemotherapy.Risks and benefits of image guided port-a-catheter placement was discussed with the patient including, but not limited to bleeding, infection, pneumothorax, or fibrin sheath development and need for additional procedures.  All of the patient's questions were answered, patient is agreeable to proceed. Consent signed and in chart.  LABS PENDING   Thank you for this interesting consult.  I greatly enjoyed meeting Jadan Hinojos and look forward to participating in their care.  A copy of this report was sent to the requesting provider on this date.  Electronically Signed: D. Rowe Robert, PA-C 04/22/2018, 1:17 PM   I spent a total of 25 minutes    in face to face in  clinical consultation, greater than 50% of which was counseling/coordinating care for Port-A-Cath placement

## 2018-04-23 ENCOUNTER — Telehealth: Payer: Self-pay | Admitting: Hematology

## 2018-04-23 LAB — ACID FAST CULTURE WITH REFLEXED SENSITIVITIES (MYCOBACTERIA): Acid Fast Culture: NEGATIVE

## 2018-04-23 NOTE — Telephone Encounter (Deleted)
Carlyon Prows will take care of the appointment for the patient to be admitted 1/20 at the ED.

## 2018-04-23 NOTE — Telephone Encounter (Signed)
Sandi stated that she will take care of the appointment for the patient to be admitted 1/20 at the ED.

## 2018-04-24 ENCOUNTER — Other Ambulatory Visit (HOSPITAL_COMMUNITY): Payer: BLUE CROSS/BLUE SHIELD

## 2018-04-24 ENCOUNTER — Telehealth: Payer: Self-pay | Admitting: *Deleted

## 2018-04-24 NOTE — Telephone Encounter (Signed)
Per Dr. Irene Limbo, arrange admission on 6E, for 04/28/2018 for 5 days EPOCH.  Contacted bed placement/Sean to give demographics.  Sent email to Inpatient Chemotherapy and message to [facility] advising them of admission

## 2018-04-28 ENCOUNTER — Encounter (HOSPITAL_COMMUNITY): Payer: Self-pay

## 2018-04-28 ENCOUNTER — Other Ambulatory Visit: Payer: Self-pay

## 2018-04-28 ENCOUNTER — Inpatient Hospital Stay (HOSPITAL_COMMUNITY)
Admission: RE | Admit: 2018-04-28 | Discharge: 2018-05-02 | DRG: 841 | Disposition: A | Discharge status: Home / Self Care | Payer: BLUE CROSS/BLUE SHIELD | Source: Ambulatory Visit | Attending: Hematology | Admitting: Hematology

## 2018-04-28 DIAGNOSIS — R222 Localized swelling, mass and lump, trunk: Secondary | ICD-10-CM | POA: Diagnosis present

## 2018-04-28 DIAGNOSIS — I82B12 Acute embolism and thrombosis of left subclavian vein: Secondary | ICD-10-CM | POA: Diagnosis present

## 2018-04-28 DIAGNOSIS — Z7189 Other specified counseling: Secondary | ICD-10-CM

## 2018-04-28 DIAGNOSIS — I82C12 Acute embolism and thrombosis of left internal jugular vein: Secondary | ICD-10-CM | POA: Diagnosis present

## 2018-04-28 DIAGNOSIS — C8592 Non-Hodgkin lymphoma, unspecified, intrathoracic lymph nodes: Principal | ICD-10-CM | POA: Diagnosis present

## 2018-04-28 DIAGNOSIS — C8528 Mediastinal (thymic) large B-cell lymphoma, lymph nodes of multiple sites: Secondary | ICD-10-CM | POA: Diagnosis not present

## 2018-04-28 DIAGNOSIS — Z5111 Encounter for antineoplastic chemotherapy: Secondary | ICD-10-CM | POA: Diagnosis not present

## 2018-04-28 DIAGNOSIS — I82622 Acute embolism and thrombosis of deep veins of left upper extremity: Secondary | ICD-10-CM | POA: Diagnosis not present

## 2018-04-28 DIAGNOSIS — C8522 Mediastinal (thymic) large B-cell lymphoma, intrathoracic lymph nodes: Secondary | ICD-10-CM | POA: Diagnosis present

## 2018-04-28 LAB — COMPREHENSIVE METABOLIC PANEL
ALT: 45 U/L — AB (ref 0–44)
AST: 17 U/L (ref 15–41)
Albumin: 4.1 g/dL (ref 3.5–5.0)
Alkaline Phosphatase: 100 U/L (ref 38–126)
Anion gap: 9 (ref 5–15)
BUN: 12 mg/dL (ref 6–20)
CO2: 26 mmol/L (ref 22–32)
Calcium: 9.4 mg/dL (ref 8.9–10.3)
Chloride: 103 mmol/L (ref 98–111)
Creatinine, Ser: 0.58 mg/dL — ABNORMAL LOW (ref 0.61–1.24)
GFR calc non Af Amer: >60 mL/min (ref >60)
Glucose, Bld: 91 mg/dL (ref 70–99)
Potassium: 4 mmol/L (ref 3.5–5.1)
Sodium: 138 mmol/L (ref 135–145)
Total Bilirubin: 0.6 mg/dL (ref 0.3–1.2)
Total Protein: 7.1 g/dL (ref 6.5–8.1)

## 2018-04-28 LAB — CBC WITH DIFFERENTIAL/PLATELET
Abs Immature Granulocytes: 0.12 10*3/uL — ABNORMAL HIGH (ref 0.00–0.07)
Basophils Absolute: 0.1 10*3/uL (ref 0.0–0.1)
Basophils Relative: 1 %
Eosinophils Absolute: 0.2 10*3/uL (ref 0.0–0.5)
Eosinophils Relative: 2 %
HEMATOCRIT: 35.4 % — AB (ref 39.0–52.0)
HEMOGLOBIN: 11.1 g/dL — AB (ref 13.0–17.0)
Immature Granulocytes: 1 %
Lymphocytes Relative: 9 %
Lymphs Abs: 1 10*3/uL (ref 0.7–4.0)
MCH: 27.1 pg (ref 26.0–34.0)
MCHC: 31.4 g/dL (ref 30.0–36.0)
MCV: 86.3 fL (ref 80.0–100.0)
MONOS PCT: 8 %
Monocytes Absolute: 0.9 10*3/uL (ref 0.1–1.0)
Neutro Abs: 9.3 10*3/uL — ABNORMAL HIGH (ref 1.7–7.7)
Neutrophils Relative %: 79 %
Platelets: 301 10*3/uL (ref 150–400)
RBC: 4.1 MIL/uL — ABNORMAL LOW (ref 4.22–5.81)
RDW: 20.1 % — ABNORMAL HIGH (ref 11.5–15.5)
WBC: 11.6 10*3/uL — ABNORMAL HIGH (ref 4.0–10.5)
nRBC: 0.9 % — ABNORMAL HIGH (ref 0.0–0.2)

## 2018-04-28 LAB — MAGNESIUM: Magnesium: 2.4 mg/dL (ref 1.7–2.4)

## 2018-04-28 MED ORDER — ENOXAPARIN SODIUM 60 MG/0.6ML ~~LOC~~ SOLN
55.0000 mg | Freq: Two times a day (BID) | SUBCUTANEOUS | Status: DC
  Administered 2018-04-28: 55 mg via SUBCUTANEOUS
  Filled 2018-04-28: qty 0.6

## 2018-04-28 MED ORDER — SENNOSIDES-DOCUSATE SODIUM 8.6-50 MG PO TABS: 2.0000 | ORAL_TABLET | Freq: Every evening | ORAL | Status: DC | PRN

## 2018-04-28 MED ORDER — SODIUM CHLORIDE 0.9 % IV SOLN
Freq: Once | INTRAVENOUS | Status: AC
  Administered 2018-04-28: 8 mg via INTRAVENOUS
  Filled 2018-04-28: qty 4

## 2018-04-28 MED ORDER — HOT PACK MISC ONCOLOGY
1.0000 | Freq: Once | Status: DC | PRN
  Filled 2018-04-28: qty 1

## 2018-04-28 MED ORDER — PREDNISONE 20 MG PO TABS
60.0000 mg | ORAL_TABLET | Freq: Every day | ORAL | Status: DC
  Administered 2018-04-29 – 2018-05-02 (×4): 60 mg via ORAL
  Filled 2018-04-28 (×4): qty 3

## 2018-04-28 MED ORDER — COLD PACK MISC ONCOLOGY
1.0000 | Freq: Once | Status: DC | PRN
  Filled 2018-04-28: qty 1

## 2018-04-28 MED ORDER — ACETAMINOPHEN 325 MG PO TABS: 650.0000 mg | ORAL_TABLET | ORAL | Status: DC | PRN

## 2018-04-28 MED ORDER — TRAMADOL HCL 50 MG PO TABS: 50.0000 mg | ORAL_TABLET | Freq: Four times a day (QID) | ORAL | Status: DC | PRN

## 2018-04-28 MED ORDER — VINCRISTINE SULFATE CHEMO INJECTION 1 MG/ML
Freq: Once | INTRAVENOUS | Status: AC
  Administered 2018-04-28: 18:00:00 via INTRAVENOUS
  Filled 2018-04-28: qty 9

## 2018-04-28 MED ORDER — SODIUM CHLORIDE 0.9 % IV SOLN
INTRAVENOUS | Status: DC
  Administered 2018-04-28: 15 mL/h via INTRAVENOUS

## 2018-04-28 MED ORDER — ALBUTEROL SULFATE (2.5 MG/3ML) 0.083% IN NEBU: 3.0000 mL | INHALATION_SOLUTION | RESPIRATORY_TRACT | Status: DC | PRN

## 2018-04-28 NOTE — H&P (Signed)
HEMATOLOGY/ONCOLOGY H&P NOTE  Date of Service: 04/28/2018  Patient Care Team: Patient, No Pcp Per as PCP - General (General Practice)  CHIEF COMPLAINTS/PURPOSE OF CONSULTATION:  C3 da EPOCH-R treatment of Primary Mediastinal B-cell Non-Hodgkin's Lymphoma  HISTORY OF PRESENTING ILLNESS:   Nicholas Moreno is a wonderful 23 y.o. male who has been admitted today for [place] treatment for his Primary Mediastinal B-Cell Non-Hodgkin's lymphoma. Nicholas Moreno is accompanied today by his sisters at bedside. The pt reports that he is doing well overall.   The pt reports that he has not developed any new concerns in the interim. He notes that he has been eating well and has been staying hydrated. He denies any fevers, chills, and concerns for infections. The pt had his port placed in the interim and denies any pain associated with the area, and denies any hand, arm, or leg swelling.   The pt denies any difficulty breathing, and endorses a strong appetite.   Lab results today (04/28/18) of CBC w/diff and CMP is as follows: all values are WNL except for WBC at 11.6k, RBC at 4.10, HGB at 11.1, HCT at 35.4, RDW at 20.1, nRBC at 0.9%, ANC at 9.3k, Abs immature granulocytes at 0.12k, Creatinine at 0.58, ALT at 45.  On review of systems, pt reports good energy levels, staying hydrated, eating well, strong appetite, weight gain, breathing well, and denies hand swelling, arm swelling, leg swelling, abdominal pains, difficulty breathing, problems passing urine, and any other symptoms.   MEDICAL HISTORY:  History reviewed. No pertinent past medical history.  SURGICAL HISTORY: Past Surgical History:  Procedure Laterality Date  . IR IMAGING GUIDED PORT INSERTION  04/22/2018  . MEDIASTINOSCOPY N/A 03/10/2018   Procedure: MEDIASTINOSCOPY;  Surgeon: Ivin Poot, MD;  Location: [facility];  Service: Thoracic;  Laterality: N/A;  . VIDEO ASSISTED THORACOSCOPY (VATS)/ LYMPH NODE SAMPLING Left 03/10/2018     Procedure: VIDEO ASSISTED THORACOSCOPY (VATS)/ BIOSPY ANTERIOR APPROACH;  Surgeon: Ivin Poot, MD;  Location: [facility];  Service: Thoracic;  Laterality: Left;  Marland Kitchen VIDEO BRONCHOSCOPY Left 03/10/2018   Procedure: VIDEO BRONCHOSCOPY;  Surgeon: Ivin Poot, MD;  Location: [facility] OR;  Service: Thoracic;  Laterality: Left;    SOCIAL HISTORY: Social History   Socioeconomic History  . Marital status: Single    Spouse name: Not on file  . Number of children: Not on file  . Years of education: Not on file  . Highest education level: Not on file  Occupational History  . Not on file  Social Needs  . Financial resource strain: Not on file  . Food insecurity:    Worry: Not on file    Inability: Not on file  . Transportation needs:    Medical: Not on file    Non-medical: Not on file  Tobacco Use  . Smoking status: Never Smoker  . Smokeless tobacco: Never Used  Substance and Sexual Activity  . Alcohol use: Not Currently  . Drug use: Not Currently  . Sexual activity: Not on file  Lifestyle  . Physical activity:    Days per week: Not on file    Minutes per session: Not on file  . Stress: Not on file  Relationships  . Social connections:    Talks on phone: Not on file    Gets together: Not on file    Attends religious service: Not on file    Active member of club or organization: Not on file    Attends  meetings of clubs or organizations: Not on file    Relationship status: Not on file  . Intimate partner violence:    Fear of current or ex partner: Not on file    Emotionally abused: Not on file    Physically abused: Not on file    Forced sexual activity: Not on file  Other Topics Concern  . Not on file  Social History Narrative  . Not on file    FAMILY HISTORY: History reviewed. No pertinent family history.  ALLERGIES:  has No Known Allergies.  MEDICATIONS:  Current Facility-Administered Medications  Medication Dose Route Frequency Provider Last Rate Last Dose  . 0.9 %   sodium chloride infusion   Intravenous Continuous Brunetta Genera, MD      . acetaminophen (TYLENOL) tablet 650 mg  650 mg Oral Q4H PRN Brunetta Genera, MD      . albuterol (PROVENTIL) (2.5 MG/3ML) 0.083% nebulizer solution 3 mL  3 mL Inhalation Q4H PRN Brunetta Genera, MD      . Cold Pack 1 packet  1 packet Topical Once PRN Brunetta Genera, MD      . DOXOrubicin (ADRIAMYCIN) 18 mg, etoposide (VEPESID) 94 mg, vinCRIStine (ONCOVIN) 0.6 mg in sodium chloride 0.9 % 1,000 mL chemo infusion   Intravenous Once Brunetta Genera, MD      . enoxaparin (LOVENOX) injection 55 mg  55 mg Subcutaneous Q12H Brunetta Genera, MD      . Hot Pack 1 packet  1 packet Topical Once PRN Brunetta Genera, MD      . ondansetron [facility]) 8 mg, dexamethasone (DECADRON) 10 mg in sodium chloride 0.9 % 50 mL IVPB   Intravenous Once Brunetta Genera, MD      . predniSONE (DELTASONE) tablet 60 mg  60 mg Oral QAC breakfast Brunetta Genera, MD      . senna-docusate (Senokot-S) tablet 2 tablet  2 tablet Oral QHS PRN Brunetta Genera, MD      . traMADol Veatrice Bourbon) tablet 50 mg  50 mg Oral Q6H PRN Brunetta Genera, MD        REVIEW OF SYSTEMS:    10 Point review of Systems was done is negative except as noted above.  PHYSICAL EXAMINATION: ECOG PERFORMANCE STATUS: 0 - Asymptomatic  . Vitals:   04/28/18 1058 04/28/18 1507  BP: 113/65 112/68  Pulse: (!) 104 96  Resp: 18 16  Temp: 98 F (36.7 C) 98 F (36.7 C)  SpO2: 100% 99%   Filed Weights   04/28/18 1507  Weight: 122 lb 9.2 oz (55.6 kg)   .Body mass index is 19.78 kg/m.  GENERAL:alert, in no acute distress and comfortable SKIN: no acute rashes, no significant lesions EYES: conjunctiva are pink and non-injected, sclera anicteric OROPHARYNX: MMM, no exudates, no oropharyngeal erythema or ulceration NECK: supple, no JVD LYMPH:  no palpable lymphadenopathy in the cervical, axillary or inguinal regions LUNGS: clear to  auscultation b/l with normal respiratory effort HEART: regular rate & rhythm ABDOMEN:  normoactive bowel sounds , non tender, not distended. No palpable hepatosplenomegaly.  Extremity: no pedal edema PSYCH: alert & oriented x 3 with fluent speech NEURO: no focal motor/sensory deficits  LABORATORY DATA:  I have reviewed the data as listed  . CBC Latest Ref Rng & Units 04/28/2018 04/22/2018 04/21/2018  WBC 4.0 - 10.5 K/uL 11.6(H) 18.5(H) 14.0(H)  Hemoglobin 13.0 - 17.0 g/dL 11.1(L) 10.5(L) 11.3(L)  Hematocrit 39.0 - 52.0 % 35.4(L) 33.9(L) 35.2(L)  Platelets 150 - 400 K/uL 301 251 288    . CMP Latest Ref Rng & Units 04/28/2018 04/21/2018 04/14/2018  Glucose 70 - 99 mg/dL 91 101(H) 102(H)  BUN 6 - 20 mg/dL '12 12 12  '$ Creatinine 0.61 - 1.24 mg/dL 0.58(L) 0.81 0.59(L)  Sodium 135 - 145 mmol/L 138 139 141  Potassium 3.5 - 5.1 mmol/L 4.0 3.6 3.7  Chloride 98 - 111 mmol/L 103 103 105  CO2 22 - 32 mmol/L '26 28 28  '$ Calcium 8.9 - 10.3 mg/dL 9.4 9.8 8.8(L)  Total Protein 6.5 - 8.1 g/dL 7.1 7.3 5.9(L)  Total Bilirubin 0.3 - 1.2 mg/dL 0.6 0.3 0.9  Alkaline Phos 38 - 126 U/L 100 149(H) 52  AST 15 - 41 U/L 17 34 15  ALT 0 - 44 U/L 45(H) 121(H) 20     RADIOGRAPHIC STUDIES: I have personally reviewed the radiological images as listed and agreed with the findings in the report. Dg Chest 2 View  Result Date: 04/11/2018 CLINICAL DATA:  B-cell lymphoma with malignant pleural effusion. EXAM: CHEST - 2 VIEW COMPARISON:  CT scan 04/03/2018 FINDINGS: The heart is normal in size and stable. Stable appearing mediastinal masses when compared to the recent chest CT. Significant improvement since the prior chest x-ray. No residual left pleural effusion is identified. The right PICC line is in good position without complicating features. IMPRESSION: Right PICC line in good position, unchanged. Stable mediastinal/hilar mass is since recent chest CT. No persistent/residual left pleural effusion. Electronically Signed    By: Marijo Sanes M.D.   On: 04/11/2018 09:46   Nm Pet Image Initial (pi) Skull Base To Thigh  Result Date: 04/03/2018 CLINICAL DATA:  Subsequent treatment strategy for non-Hodgkin's lymphoma. B-cell lymphoma. Mediastinal mass. EXAM: NUCLEAR MEDICINE PET SKULL BASE TO THIGH TECHNIQUE: 6.4 mCi F-18 FDG was injected intravenously. Full-ring PET imaging was performed from the skull base to thigh after the radiotracer. CT data was obtained and used for attenuation correction and anatomic localization. Fasting blood glucose: 82 mg/dl COMPARISON:  Chest CT 03/07/2018 FINDINGS: Mediastinal blood pool activity: SUV max 1.37 NECK: No hypermetabolic lymph nodes in the neck. There is decreased metabolism within the LEFT vocal cord consistent with impingement upon the LEFT recurrent laryngeal nerve by the mediastinal mass. Incidental CT findings: none CHEST: There is marked decrease in volume of the bulky anterior mediastinal mass compared to CT 03/07/2018. Mass involving the LEFT hemithorax measured roughly 13 cm by 8.6 cm and now measures 7.0 cm by 6.1 cm. The residual LEFT hemithorax mass has only mild-to-moderate peripheral metabolic activity with SUV max equal 3.9. The central portion of the mass is photopenic consistent with necrosis. Likewise mass involving the superior RIGHT hemithorax is decreased in volume measuring 4.6 by 3.3 cm decreased from 8.0 by 6.7 cm. This RIGHT upper lobe mass also has only peripheral metabolic activity SUV max equal 5.1. The more intense intense activity is associated bronchiectasis and atelectasis of the RIGHT upper lobe. Centrally within the anterior mediastinum there is interval peripheral calcification of the previously ill-defined mass which occupied the anterior mediastinum. The tissue within the anterior mediastinum is mildly hypermetabolic with SUV max equal 4.4. No new or suspicious pulmonary nodules. No axillary adenopathy which is hypermetabolic. Incidental CT findings: none  ABDOMEN/PELVIS: Spleen is normal size and normal metabolic activity. No hypermetabolic abdominal or pelvic lymph nodes. Liver is normal. Bowel normal. Physiologic activity noted within the testicles Incidental CT findings: None SKELETON: Mild diffuse marrow activity. No focality. The  marrow activity is less than normal liver activity. Physiologic activity noted within the testicles. Incidental CT findings: none IMPRESSION: 1. Marked reduction in volume LEFT upper lobe, mediastinal, and RIGHT upper lobe bulky masses. The residual mass lesions havemild to moderate peripheral hypermetabolic activity with central necrosis. The anterior mediastinal nodal masses are partially calcified. 2. No evidence of disease progression. 3. Normal marrow and spleen. 4. Metabolic activity in the testicles is favored physiologic. 5. 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 Electronically Signed   By: Suzy Bouchard M.D.   On: 04/03/2018 16:08   Ir Imaging Guided Port Insertion  Result Date: 04/22/2018 CLINICAL DATA:  History of B-cell non-Hodgkin's mediastinal lymphoma. Currently receiving chemotherapy and in need for porta cath for continued chemotherapy and IV access needs. EXAM: IMPLANTED PORT A CATH PLACEMENT WITH ULTRASOUND AND FLUOROSCOPIC GUIDANCE ANESTHESIA/SEDATION: 4.0 mg IV Versed; 150 mcg IV Fentanyl Total Moderate Sedation Time:  42 minutes. The patient's level of consciousness and physiologic status were continuously monitored during the procedure by Radiology nursing. Additional Medications: 2 g IV Ancef. FLUOROSCOPY TIME:  36 seconds.  2.3 mGy. PROCEDURE: The procedure, risks, benefits, and alternatives were explained to the patient. Questions regarding the procedure were encouraged and answered. The patient understands and consents to the procedure. A time-out was performed prior to initiating the procedure. Ultrasound was  utilized to confirm patency of the right internal jugular vein. The right neck and chest were prepped with chlorhexidine in a sterile fashion, and a sterile drape was applied covering the operative field. Maximum barrier sterile technique with sterile gowns and gloves were used for the procedure. Local anesthesia was provided with 1% lidocaine. After creating a small venotomy incision, a 21 gauge needle was advanced into the right internal jugular vein under direct, real-time ultrasound guidance. Ultrasound image documentation was performed. After securing guidewire access, an 8 Fr dilator was placed. A J-wire was kinked to measure appropriate catheter length. A subcutaneous port pocket was then created along the upper chest wall utilizing sharp and blunt dissection. Portable cautery was utilized. The pocket was irrigated with sterile saline. A single lumen power injectable port was chosen for placement. The 8 Fr catheter was tunneled from the port pocket site to the venotomy incision. The port was placed in the pocket. External catheter was trimmed to appropriate length based on guidewire measurement. At the venotomy, an 8 Fr peel-away sheath was placed over a guidewire. The catheter was then placed through the sheath and the sheath removed. Final catheter positioning was confirmed and documented with a fluoroscopic spot image. The port was accessed with a needle and aspirated and flushed with heparinized saline. The access needle was removed. The venotomy and port pocket incisions were closed with subcutaneous 3-0 Monocryl and subcuticular 4-0 Vicryl. Dermabond was applied to both incisions. COMPLICATIONS: COMPLICATIONS None FINDINGS: After catheter placement, the tip lies at the cavo-atrial junction. The catheter aspirates normally and is ready for immediate use. IMPRESSION: Placement of single lumen port a cath via right internal jugular vein. The catheter tip lies at the cavo-atrial junction. A power injectable  port a cath was placed and is ready for immediate use. Electronically Signed   By: Aletta Edouard M.D.   On: 04/22/2018 16:23   [place] Ekg Site Rite  Result Date: 04/10/2018 If Site Rite image not attached, placement could not be confirmed due to current cardiac rhythm.   ASSESSMENT & PLAN:  23 y.o. male with  #1Recently diagnosed Primary Mediastinal B cell Non Hodgkins lymphoma c-MYC negative Not a double hit lymphoma Pathology confirmed by pathology read at [facility].  #2 s/p SVC compression due to mediastinal mass without overt SVC syndrome clinical symptoms #3 small acute distal left upper lobe pulmonary embolus-on lovenox #4 Acute Left IJ and Subclavian DVT due to left sided venous compression due to mass and due to malignancy -on lovenox for malignancy related thrombosis. #4 left-sided pleural effusion and left-sided lung atelectasis due to airway compression from mediastinal mass -resolved on CXR  #5 significant weight loss of 20 pounds likely due to lymphoma- resolving with treatment. . Wt Readings from Last 3 Encounters:  04/28/18 122 lb 9.2 oz (55.6 kg)  04/21/18 124 lb 11.2 oz (56.6 kg)  04/10/18 120 lb (54.4 kg)    #6 drenching night sweats likely has constitutional symptoms from his PMBCL - resolved  #7 Hypokalemia- resolved K 4  PLAN: -Discussed pt labwork today, 04/28/18; ALT and Alk Phos normalized, other blood counts and chemistries are stable  -The pt has no prohibitive toxicities from continuing [place] at this time. -Continue eating well, staying hydrated, and staying as active as reasonably possible  -As previously discussed, as the patient is tolerating treatment very well so far, will proceed with one level dose escalation of EPOCH-R with C3 from today. -chemotherapy orders placed and discussed and confirmed with pharmacy. -Will switch pt to Eliquis  -Outpatient Neulasta and Rituxan infusion on 05/05/18 -Recommend salt and baking soda  mouthwashes 4-5 times per day -planning for rpt PET/CT after 4 cycles to evaluate treatment response. - Senna S HS prn for constipation -Will see pt again on C3D12 in clinic for nadir check   All of the patients questions were answered with apparent satisfaction. The patient knows to call the clinic with any problems, questions or concerns.   Sullivan Lone MD MS AAHIVMS [facility] Hematology/Oncology Physician [facility]  (Office):       [phone] (Work cell):  [phone] (Fax):           [phone]  04/28/2018 4:01 PM  I, Baldwin [place], am acting as a scribe for Dr. Sullivan Lone.   .I have reviewed the above documentation for accuracy and completeness, and I agree with the above. Sullivan Lone MD MS

## 2018-04-28 NOTE — Progress Notes (Signed)
Discussed with Dr. Irene Limbo, patient will be escalated one level of dosing for C3 of EPOCH-R. New doses are as follows:  - Doxorubicin 12 mg/m2/day - Etoposide 60 mg/m2/day - Cylcophosphamide 900 mg/m2  Rituximab, vincristine, and prednisone doses remain the same. MD updated orders.  Demetrius Charity, PharmD, [facility] Oncology Pharmacist Pharmacy Phone: [phone] 04/28/2018

## 2018-04-29 LAB — COMPREHENSIVE METABOLIC PANEL
ALT: 41 U/L (ref 0–44)
AST: 19 U/L (ref 15–41)
Albumin: 3.9 g/dL (ref 3.5–5.0)
Alkaline Phosphatase: 90 U/L (ref 38–126)
Anion gap: 8 (ref 5–15)
BUN: 10 mg/dL (ref 6–20)
CO2: 24 mmol/L (ref 22–32)
Calcium: 9.4 mg/dL (ref 8.9–10.3)
Chloride: 107 mmol/L (ref 98–111)
Creatinine, Ser: 0.61 mg/dL (ref 0.61–1.24)
GFR calc Af Amer: >60 mL/min (ref >60)
Glucose, Bld: 132 mg/dL — ABNORMAL HIGH (ref 70–99)
Potassium: 3.9 mmol/L (ref 3.5–5.1)
SODIUM: 139 mmol/L (ref 135–145)
Total Bilirubin: 0.4 mg/dL (ref 0.3–1.2)
Total Protein: 6.5 g/dL (ref 6.5–8.1)

## 2018-04-29 LAB — CBC
HCT: 32.9 % — ABNORMAL LOW (ref 39.0–52.0)
Hemoglobin: 10.3 g/dL — ABNORMAL LOW (ref 13.0–17.0)
MCH: 26.7 pg (ref 26.0–34.0)
MCHC: 31.3 g/dL (ref 30.0–36.0)
MCV: 85.2 fL (ref 80.0–100.0)
Platelets: 340 10*3/uL (ref 150–400)
RBC: 3.86 MIL/uL — AB (ref 4.22–5.81)
RDW: 19.5 % — ABNORMAL HIGH (ref 11.5–15.5)
WBC: 23.1 10*3/uL — ABNORMAL HIGH (ref 4.0–10.5)
nRBC: 0.2 % (ref 0.0–0.2)

## 2018-04-29 MED ORDER — APIXABAN 5 MG PO TABS
5.0000 mg | ORAL_TABLET | Freq: Two times a day (BID) | ORAL | Status: DC
  Administered 2018-04-29 – 2018-05-02 (×7): 5 mg via ORAL
  Filled 2018-04-29 (×7): qty 1

## 2018-04-29 MED ORDER — VINCRISTINE SULFATE CHEMO INJECTION 1 MG/ML
Freq: Once | INTRAVENOUS | Status: AC
  Administered 2018-04-29: 16:00:00 via INTRAVENOUS
  Filled 2018-04-29: qty 9

## 2018-04-29 MED ORDER — SODIUM CHLORIDE 0.9 % IV SOLN
Freq: Once | INTRAVENOUS | Status: AC
  Administered 2018-04-29: 18 mg via INTRAVENOUS
  Filled 2018-04-29: qty 4

## 2018-04-29 NOTE — Progress Notes (Signed)
HEMATOLOGY/ONCOLOGY INPATIENT PROGRESS NOTE  Date of Service: 04/29/2018  Inpatient Attending: .Brunetta Genera, MD   SUBJECTIVE:   Nicholas Moreno is accompanied today by his sister at bedside. The pt reports that he is doing well overall.   The pt reports that he has not developed any new concerns today and tolerated the first day of the higher dose well. He continues eating well and staying hydrated. The pt denies any bothersome nausea, or any other concerns. He has also been able to sleep well. The pt notes that he has been able to move his bowels well and has ambulated periodically.   Lab results today (04/29/18) of CBC w/diff and CMP is as follows: all values are WNL except for WBC at 23.1k, RBC at 3.86, HGB at 10.3, HCT a 32.9, RDW at 19.5, Glucose at 132.  On review of systems, pt reports good energy levels, staying hydrated, eating well, moving his bowels well, and denies bothersome nausea, leg swelling, abdominal pains, and any other symptoms.  OBJECTIVE:  NAD  PHYSICAL EXAMINATION: . Vitals:   04/28/18 1507 04/28/18 2200 04/29/18 0600 04/29/18 1305  BP: 112/68 100/70 105/66 102/66  Pulse: 96 81 83 81  Resp: 16 16 16 16   Temp: 98 F (36.7 C) 97.9 F (36.6 C) 97.9 F (36.6 C) 98.9 F (37.2 C)  TempSrc: Oral Oral Oral Oral  SpO2: 99% 99% 100% 99%  Weight: 122 lb 9.2 oz (55.6 kg)     Height: 5\' 6"  (1.676 m)      Filed Weights   04/28/18 1507  Weight: 122 lb 9.2 oz (55.6 kg)   .Body mass index is 19.78 kg/m.  GENERAL:alert, in no acute distress and comfortable SKIN: skin color, texture, turgor are normal, no rashes or significant lesions EYES: normal, conjunctiva are pink and non-injected, sclera clear OROPHARYNX:no exudate, no erythema and lips, buccal mucosa, and tongue normal  NECK: supple, no JVD, thyroid normal size, non-tender, without nodularity LYMPH:  no palpable lymphadenopathy in the cervical, axillary or inguinal LUNGS: clear to  auscultation with normal respiratory effort HEART: regular rate & rhythm,  no murmurs and no lower extremity edema ABDOMEN: abdomen soft, non-tender, normoactive bowel sounds  Musculoskeletal: no cyanosis of digits and no clubbing  PSYCH: alert & oriented x 3 with fluent speech NEURO: no focal motor/sensory deficits  MEDICAL HISTORY:  History reviewed. No pertinent past medical history.  SURGICAL HISTORY: Past Surgical History:  Procedure Laterality Date  . IR IMAGING GUIDED PORT INSERTION  04/22/2018  . MEDIASTINOSCOPY N/A 03/10/2018   Procedure: MEDIASTINOSCOPY;  Surgeon: Ivin Poot, MD;  Location: [place];  Service: Thoracic;  Laterality: N/A;  . VIDEO ASSISTED THORACOSCOPY (VATS)/ LYMPH NODE SAMPLING Left 03/10/2018   Procedure: VIDEO ASSISTED THORACOSCOPY (VATS)/ BIOSPY ANTERIOR APPROACH;  Surgeon: Ivin Poot, MD;  Location: [place];  Service: Thoracic;  Laterality: Left;  Marland Kitchen VIDEO BRONCHOSCOPY Left 03/10/2018   Procedure: VIDEO BRONCHOSCOPY;  Surgeon: Ivin Poot, MD;  Location[facility];  Service: Thoracic;  Laterality: Left;    SOCIAL HISTORY: Social History   Socioeconomic History  . Marital status: Single    Spouse name: Not on file  . Number of children: Not on file  . Years of education: Not on file  . Highest education level: Not on file  Occupational History  . Not on file  Social Needs  . Financial resource strain: Not on file  . Food insecurity:    Worry: Not on file  Inability: Not on file  . Transportation needs:    Medical: Not on file    Non-medical: Not on file  Tobacco Use  . Smoking status: Never Smoker  . Smokeless tobacco: Never Used  Substance and Sexual Activity  . Alcohol use: Not Currently  . Drug use: Not Currently  . Sexual activity: Not on file  Lifestyle  . Physical activity:    Days per week: Not on file    Minutes per session: Not on file  . Stress: Not on file  Relationships  . Social connections:    Talks on phone: Not  on file    Gets together: Not on file    Attends religious service: Not on file    Active member of club or organization: Not on file    Attends meetings of clubs or organizations: Not on file    Relationship status: Not on file  . Intimate partner violence:    Fear of current or ex partner: Not on file    Emotionally abused: Not on file    Physically abused: Not on file    Forced sexual activity: Not on file  Other Topics Concern  . Not on file  Social History Narrative  . Not on file    FAMILY HISTORY: History reviewed. No pertinent family history.  ALLERGIES:  has No Known Allergies.  MEDICATIONS:  Scheduled Meds: . apixaban  5 mg Oral BID  . 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 predniSONE  60 mg Oral QAC breakfast   Continuous Infusions: . sodium chloride 15 mL/hr at 04/29/18 0600   PRN Meds:.acetaminophen, albuterol, Cold Pack, Hot Pack, senna-docusate, traMADol  REVIEW OF SYSTEMS:    10 Point review of Systems was done is negative except as noted above.   LABORATORY DATA:  I have reviewed the data as listed  CBC Latest Ref Rng & Units 04/29/2018 04/28/2018 04/22/2018  WBC 4.0 - 10.5 K/uL 23.1(H) 11.6(H) 18.5(H)  Hemoglobin 13.0 - 17.0 g/dL 10.3(L) 11.1(L) 10.5(L)  Hematocrit 39.0 - 52.0 % 32.9(L) 35.4(L) 33.9(L)  Platelets 150 - 400 K/uL 340 301 251    . CMP Latest Ref Rng & Units 04/29/2018 04/28/2018 04/21/2018  Glucose 70 - 99 mg/dL 132(H) 91 101(H)  BUN 6 - 20 mg/dL 10 12 12   Creatinine 0.61 - 1.24 mg/dL 0.61 0.58(L) 0.81  Sodium 135 - 145 mmol/L 139 138 139  Potassium 3.5 - 5.1 mmol/L 3.9 4.0 3.6  Chloride 98 - 111 mmol/L 107 103 103  CO2 22 - 32 mmol/L 24 26 28   Calcium 8.9 - 10.3 mg/dL 9.4 9.4 9.8  Total Protein 6.5 - 8.1 g/dL 6.5 7.1 7.3  Total Bilirubin 0.3 - 1.2 mg/dL 0.4 0.6 0.3  Alkaline Phos 38 - 126 U/L 90 100 149(H)  AST 15 - 41 U/L 19 17 34  ALT 0 - 44 U/L 41 45(H) 121(H)     RADIOGRAPHIC STUDIES: I have personally reviewed the radiological images as listed and agreed with the findings in the report. Dg Chest 2 View  Result Date: 04/11/2018 CLINICAL DATA:  B-cell lymphoma with malignant pleural effusion. EXAM: CHEST - 2 VIEW COMPARISON:  CT scan 04/03/2018 FINDINGS: The heart is normal in size and stable. Stable appearing mediastinal masses when compared to the recent chest CT. Significant improvement since the prior chest x-ray. No residual left pleural effusion is identified. The right PICC line is in good position without complicating features. IMPRESSION: Right PICC  line in good position, unchanged. Stable mediastinal/hilar mass is since recent chest CT. No persistent/residual left pleural effusion. Electronically Signed   By: Marijo Sanes M.D.   On: 04/11/2018 09:46   Nm Pet Image Initial (pi) Skull Base To Thigh  Result Date: 04/03/2018 CLINICAL DATA:  Subsequent treatment strategy for non-Hodgkin's lymphoma. B-cell lymphoma. Mediastinal mass. EXAM: NUCLEAR MEDICINE PET SKULL BASE TO THIGH TECHNIQUE: 6.4 mCi F-18 FDG was injected intravenously. Full-ring PET imaging was performed from the skull base to thigh after the radiotracer. CT data was obtained and used for attenuation correction and anatomic localization. Fasting blood glucose: 82 mg/dl COMPARISON:  Chest CT 03/07/2018 FINDINGS: Mediastinal blood pool activity: SUV max 1.37 NECK: No hypermetabolic lymph nodes in the neck. There is decreased metabolism within the LEFT vocal cord consistent with impingement upon the LEFT recurrent laryngeal nerve by the mediastinal mass. Incidental CT findings: none CHEST: There is marked decrease in volume of the bulky anterior mediastinal mass compared to CT 03/07/2018. Mass involving the LEFT hemithorax measured roughly 13 cm by 8.6 cm and now measures 7.0 cm by 6.1 cm. The residual LEFT hemithorax mass has only mild-to-moderate peripheral metabolic activity with SUV max  equal 3.9. The central portion of the mass is photopenic consistent with necrosis. Likewise mass involving the superior RIGHT hemithorax is decreased in volume measuring 4.6 by 3.3 cm decreased from 8.0 by 6.7 cm. This RIGHT upper lobe mass also has only peripheral metabolic activity SUV max equal 5.1. The more intense intense activity is associated bronchiectasis and atelectasis of the RIGHT upper lobe. Centrally within the anterior mediastinum there is interval peripheral calcification of the previously ill-defined mass which occupied the anterior mediastinum. The tissue within the anterior mediastinum is mildly hypermetabolic with SUV max equal 4.4. No new or suspicious pulmonary nodules. No axillary adenopathy which is hypermetabolic. Incidental CT findings: none ABDOMEN/PELVIS: Spleen is normal size and normal metabolic activity. No hypermetabolic abdominal or pelvic lymph nodes. Liver is normal. Bowel normal. Physiologic activity noted within the testicles Incidental CT findings: None SKELETON: Mild diffuse marrow activity. No focality. The marrow activity is less than normal liver activity. Physiologic activity noted within the testicles. Incidental CT findings: none IMPRESSION: 1. Marked reduction in volume LEFT upper lobe, mediastinal, and RIGHT upper lobe bulky masses. The residual mass lesions havemild to moderate peripheral hypermetabolic activity with central necrosis. The anterior mediastinal nodal masses are partially calcified. 2. No evidence of disease progression. 3. Normal marrow and spleen. 4. Metabolic activity in the testicles is favored physiologic. 5. 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 Electronically Signed   By: Suzy Bouchard M.D.   On: 04/03/2018 16:08   Ir Imaging Guided Port Insertion  Result Date: 04/22/2018 CLINICAL DATA:  History of B-cell non-Hodgkin's mediastinal lymphoma.  Currently receiving chemotherapy and in need for porta cath for continued chemotherapy and IV access needs. EXAM: IMPLANTED PORT A CATH PLACEMENT WITH ULTRASOUND AND FLUOROSCOPIC GUIDANCE ANESTHESIA/SEDATION: 4.0 mg IV Versed; 150 mcg IV Fentanyl Total Moderate Sedation Time:  42 minutes. The patient's level of consciousness and physiologic status were continuously monitored during the procedure by Radiology nursing. Additional Medications: 2 g IV Ancef. FLUOROSCOPY TIME:  36 seconds.  2.3 mGy. PROCEDURE: The procedure, risks, benefits, and alternatives were explained to the patient. Questions regarding the procedure were encouraged and answered. The patient understands and consents to the procedure. A time-out was performed prior to initiating  the procedure. Ultrasound was utilized to confirm patency of the right internal jugular vein. The right neck and chest were prepped with chlorhexidine in a sterile fashion, and a sterile drape was applied covering the operative field. Maximum barrier sterile technique with sterile gowns and gloves were used for the procedure. Local anesthesia was provided with 1% lidocaine. After creating a small venotomy incision, a 21 gauge needle was advanced into the right internal jugular vein under direct, real-time ultrasound guidance. Ultrasound image documentation was performed. After securing guidewire access, an 8 Fr dilator was placed. A J-wire was kinked to measure appropriate catheter length. A subcutaneous port pocket was then created along the upper chest wall utilizing sharp and blunt dissection. Portable cautery was utilized. The pocket was irrigated with sterile saline. A single lumen power injectable port was chosen for placement. The 8 Fr catheter was tunneled from the port pocket site to the venotomy incision. The port was placed in the pocket. External catheter was trimmed to appropriate length based on guidewire measurement. At the venotomy, an 8 Fr peel-away sheath  was placed over a guidewire. The catheter was then placed through the sheath and the sheath removed. Final catheter positioning was confirmed and documented with a fluoroscopic spot image. The port was accessed with a needle and aspirated and flushed with heparinized saline. The access needle was removed. The venotomy and port pocket incisions were closed with subcutaneous 3-0 Monocryl and subcuticular 4-0 Vicryl. Dermabond was applied to both incisions. COMPLICATIONS: COMPLICATIONS None FINDINGS: After catheter placement, the tip lies at the cavo-atrial junction. The catheter aspirates normally and is ready for immediate use. IMPRESSION: Placement of single lumen port a cath via right internal jugular vein. The catheter tip lies at the cavo-atrial junction. A power injectable port a cath was placed and is ready for immediate use. Electronically Signed   By: Aletta Edouard M.D.   On: 04/22/2018 16:23   [place] Ekg Site Rite  Result Date: 04/10/2018 If Site Rite image not attached, placement could not be confirmed due to current cardiac rhythm.   ASSESSMENT & PLAN:   23 y.o. 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  #2 s/p SVC compression due to mediastinal mass without overt SVC syndrome clinical symptoms #3 small acute distal left upper lobe pulmonary embolus-on lovenox #4 Acute Left IJ and Subclavian DVT due to left sided venous compression due to mass and due to malignancy -on lovenox for malignancy related thrombosis. #4 left-sided pleural effusion and left-sided lung atelectasis due to airway compression from mediastinal mass -resolved on CXR  #5 significant weight loss of 20 pounds likely due to lymphoma- resolving with treatment.  Wt Readings from Last 3 Encounters:  04/28/18 122 lb 9.2 oz (55.6 kg)  04/21/18 124 lb 11.2 oz (56.6 kg)  04/10/18  120 lb (54.4 kg)    #6 drenching night sweats likely has constitutional symptoms from his PMBCL - resolved  #7 Hypokalemia- resolved K 4  PLAN: -Discussed pt labwork today, 04/29/18; blood counts and chemistries are stable -The pt has no prohibitive toxicities from continuing Volente at this time.   -Continue eating well, staying hydrated, and staying as active as reasonably possible  -Continue Eliquis 5mg  po BID -Outpatient Neulasta and Rituxan infusion on 05/05/18 -Recommend salt and baking soda mouthwashes 4-5 times per day -planning for rpt PET/CT after 4 cycles to evaluate  treatment response. - Senna S HS prn for constipation -Will see pt again on C3D12 in clinic for nadir check    The total time spent in the appt was 25 minutes and more than 50% was on counseling and direct patient cares.    Sullivan Lone MD MS AAHIVMS [facility] Hematology/Oncology Physician [facility]  (Office):       [phone] (Work cell):  [phone] (Fax):           [phone]  04/29/2018 4:39 PM   I, Baldwin [place], am acting as a scribe for Dr. Sullivan Lone.   .I have reviewed the above documentation for accuracy and completeness, and I agree with the above. Sullivan Lone MD MS

## 2018-04-30 LAB — CBC
HCT: 32.9 % — ABNORMAL LOW (ref 39.0–52.0)
Hemoglobin: 10.3 g/dL — ABNORMAL LOW (ref 13.0–17.0)
MCH: 26.9 pg (ref 26.0–34.0)
MCHC: 31.3 g/dL (ref 30.0–36.0)
MCV: 85.9 fL (ref 80.0–100.0)
PLATELETS: 390 10*3/uL (ref 150–400)
RBC: 3.83 MIL/uL — ABNORMAL LOW (ref 4.22–5.81)
RDW: 20.2 % — ABNORMAL HIGH (ref 11.5–15.5)
WBC: 16.2 10*3/uL — ABNORMAL HIGH (ref 4.0–10.5)
nRBC: 0.1 % (ref 0.0–0.2)

## 2018-04-30 LAB — COMPREHENSIVE METABOLIC PANEL
ALK PHOS: 73 U/L (ref 38–126)
ALT: 32 U/L (ref 0–44)
AST: 16 U/L (ref 15–41)
Albumin: 3.7 g/dL (ref 3.5–5.0)
Anion gap: 8 (ref 5–15)
BUN: 13 mg/dL (ref 6–20)
CO2: 25 mmol/L (ref 22–32)
Calcium: 9.1 mg/dL (ref 8.9–10.3)
Chloride: 108 mmol/L (ref 98–111)
Creatinine, Ser: 0.6 mg/dL — ABNORMAL LOW (ref 0.61–1.24)
GFR calc Af Amer: >60 mL/min (ref >60)
GFR calc non Af Amer: >60 mL/min (ref >60)
Glucose, Bld: 125 mg/dL — ABNORMAL HIGH (ref 70–99)
Potassium: 3.4 mmol/L — ABNORMAL LOW (ref 3.5–5.1)
Sodium: 141 mmol/L (ref 135–145)
Total Bilirubin: 0.6 mg/dL (ref 0.3–1.2)
Total Protein: 6.2 g/dL — ABNORMAL LOW (ref 6.5–8.1)

## 2018-04-30 MED ORDER — SODIUM CHLORIDE 0.9 % IV SOLN
Freq: Once | INTRAVENOUS | Status: AC
  Administered 2018-04-30: 18 mg via INTRAVENOUS
  Filled 2018-04-30: qty 4

## 2018-04-30 MED ORDER — VINCRISTINE SULFATE CHEMO INJECTION 1 MG/ML
Freq: Once | INTRAVENOUS | Status: AC
  Administered 2018-04-30: 14:00:00 via INTRAVENOUS
  Filled 2018-04-30: qty 9

## 2018-04-30 NOTE — Progress Notes (Signed)
HEMATOLOGY/ONCOLOGY INPATIENT PROGRESS NOTE  Date of Service: 04/30/2018  Inpatient Attending: .Brunetta Genera, MD   SUBJECTIVE:   Nicholas Moreno reports that he is doing well overall.   The pt reports that he has not developed any new symptoms or concerns since yesterday. He notes that he has continued eating well and staying hydrated. The pt denies any nausea or abdominal pains. He has continued to move his bowels, and he denies leg swelling.   Lab results today (04/30/18) of CBC w/diff and CMP is as follows: all values are WNL except for WBC at 16.2k, RBC at 3.83, HGB at 10.3, HCT at 32.9, RDW at 20.2, Potassium at 3.4, Glucose at 125, Creatinine at 0.60, Total Protein at 6.2.  On review of systems, pt reports stable energy levels, eating well, staying hydrated, moving his bowels well and denies nausea, fevers, chills, night sweats, abdominal pains, constipation, leg swelling, and any other symptoms.   OBJECTIVE:  NAD  PHYSICAL EXAMINATION: . Vitals:   04/29/18 1305 04/29/18 2123 04/30/18 0600 04/30/18 1319  BP: 102/66 113/67 (!) 106/54 (!) 116/55  Pulse: 81 89 85 83  Resp: 16 14 16 16   Temp: 98.9 F (37.2 C) 97.9 F (36.6 C) 97.6 F (36.4 C) 97.9 F (36.6 C)  TempSrc: Oral Oral Oral Oral  SpO2: 99% 98% 98% 100%  Weight:      Height:       Filed Weights   04/28/18 1507  Weight: 122 lb 9.2 oz (55.6 kg)   .Body mass index is 19.78 kg/m.  GENERAL:alert, in no acute distress and comfortable SKIN: no acute rashes, no significant lesions EYES: conjunctiva are pink and non-injected, sclera anicteric OROPHARYNX: MMM, no exudates, no oropharyngeal erythema or ulceration NECK: supple, no JVD LYMPH:  no palpable lymphadenopathy in the cervical, axillary or inguinal regions LUNGS: clear to auscultation b/l with normal respiratory effort HEART: regular rate & rhythm ABDOMEN:  normoactive bowel sounds , non tender, not distended. No palpable hepatosplenomegaly.    Extremity: no pedal edema PSYCH: alert & oriented x 3 with fluent speech NEURO: no focal motor/sensory deficits    MEDICAL HISTORY:  History reviewed. No pertinent past medical history.  SURGICAL HISTORY: Past Surgical History:  Procedure Laterality Date  . IR IMAGING GUIDED PORT INSERTION  04/22/2018  . MEDIASTINOSCOPY N/A 03/10/2018   Procedure: MEDIASTINOSCOPY;  Surgeon: Ivin Poot, MD;  Location: [place];  Service: Thoracic;  Laterality: N/A;  . VIDEO ASSISTED THORACOSCOPY (VATS)/ LYMPH NODE SAMPLING Left 03/10/2018   Procedure: VIDEO ASSISTED THORACOSCOPY (VATS)/ BIOSPY ANTERIOR APPROACH;  Surgeon: Ivin Poot, MD;  Location: [place];  Service: Thoracic;  Laterality: Left;  Marland Kitchen VIDEO BRONCHOSCOPY Left 03/10/2018   Procedure: VIDEO BRONCHOSCOPY;  Surgeon: Ivin Poot, MD;  Location: [facility] OR;  Service: Thoracic;  Laterality: Left;    SOCIAL HISTORY: Social History   Socioeconomic History  . Marital status: Single    Spouse name: Not on file  . Number of children: Not on file  . Years of education: Not on file  . Highest education level: Not on file  Occupational History  . Not on file  Social Needs  . Financial resource strain: Not on file  . Food insecurity:    Worry: Not on file    Inability: Not on file  . Transportation needs:    Medical: Not on file    Non-medical: Not on file  Tobacco Use  . Smoking status: Never Smoker  . Smokeless tobacco:  Never Used  Substance and Sexual Activity  . Alcohol use: Not Currently  . Drug use: Not Currently  . Sexual activity: Not on file  Lifestyle  . Physical activity:    Days per week: Not on file    Minutes per session: Not on file  . Stress: Not on file  Relationships  . Social connections:    Talks on phone: Not on file    Gets together: Not on file    Attends religious service: Not on file    Active member of club or organization: Not on file    Attends meetings of clubs or organizations: Not on file     Relationship status: Not on file  . Intimate partner violence:    Fear of current or ex partner: Not on file    Emotionally abused: Not on file    Physically abused: Not on file    Forced sexual activity: Not on file  Other Topics Concern  . Not on file  Social History Narrative  . Not on file    FAMILY HISTORY: History reviewed. No pertinent family history.  ALLERGIES:  has No Known Allergies.  MEDICATIONS:  Scheduled Meds: . apixaban  5 mg Oral BID  . 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 predniSONE  60 mg Oral QAC breakfast   Continuous Infusions: . sodium chloride 15 mL/hr at 04/30/18 0600   PRN Meds:.acetaminophen, albuterol, Cold Pack, Hot Pack, senna-docusate, traMADol  REVIEW OF SYSTEMS:    A 10+ POINT REVIEW OF SYSTEMS WAS OBTAINED including neurology, dermatology, psychiatry, cardiac, respiratory, lymph, extremities, GI, GU, Musculoskeletal, constitutional, breasts, reproductive, HEENT.  All pertinent positives are noted in the HPI.  All others are negative.   LABORATORY DATA:  I have reviewed the data as listed  CBC Latest Ref Rng & Units 04/30/2018 04/29/2018 04/28/2018  WBC 4.0 - 10.5 K/uL 16.2(H) 23.1(H) 11.6(H)  Hemoglobin 13.0 - 17.0 g/dL 10.3(L) 10.3(L) 11.1(L)  Hematocrit 39.0 - 52.0 % 32.9(L) 32.9(L) 35.4(L)  Platelets 150 - 400 K/uL 390 340 301    . CMP Latest Ref Rng & Units 04/30/2018 04/29/2018 04/28/2018  Glucose 70 - 99 mg/dL 125(H) 132(H) 91  BUN 6 - 20 mg/dL 13 10 12   Creatinine 0.61 - 1.24 mg/dL 0.60(L) 0.61 0.58(L)  Sodium 135 - 145 mmol/L 141 139 138  Potassium 3.5 - 5.1 mmol/L 3.4(L) 3.9 4.0  Chloride 98 - 111 mmol/L 108 107 103  CO2 22 - 32 mmol/L 25 24 26   Calcium 8.9 - 10.3 mg/dL 9.1 9.4 9.4  Total Protein 6.5 - 8.1 g/dL 6.2(L) 6.5 7.1  Total Bilirubin 0.3 - 1.2 mg/dL 0.6 0.4 0.6  Alkaline Phos 38 - 126 U/L 73 90 100  AST 15 - 41 U/L 16 19 17   ALT 0 - 44 U/L 32 41 45(H)     RADIOGRAPHIC STUDIES: I have personally reviewed the radiological images as listed and agreed with the findings in the report. Dg Chest 2 View  Result Date: 04/11/2018 CLINICAL DATA:  B-cell lymphoma with malignant pleural effusion. EXAM: CHEST - 2 VIEW COMPARISON:  CT scan 04/03/2018 FINDINGS: The heart is normal in size and stable. Stable appearing mediastinal masses when compared to the recent chest CT. Significant improvement since the prior chest x-ray. No residual left pleural effusion is identified. The right PICC line is in good position without complicating features. IMPRESSION: Right PICC line in good position, unchanged. Stable mediastinal/hilar mass is since recent  chest CT. No persistent/residual left pleural effusion. Electronically Signed   By: Marijo Sanes M.D.   On: 04/11/2018 09:46   Nm Pet Image Initial (pi) Skull Base To Thigh  Result Date: 04/03/2018 CLINICAL DATA:  Subsequent treatment strategy for non-Hodgkin's lymphoma. B-cell lymphoma. Mediastinal mass. EXAM: NUCLEAR MEDICINE PET SKULL BASE TO THIGH TECHNIQUE: 6.4 mCi F-18 FDG was injected intravenously. Full-ring PET imaging was performed from the skull base to thigh after the radiotracer. CT data was obtained and used for attenuation correction and anatomic localization. Fasting blood glucose: 82 mg/dl COMPARISON:  Chest CT 03/07/2018 FINDINGS: Mediastinal blood pool activity: SUV max 1.37 NECK: No hypermetabolic lymph nodes in the neck. There is decreased metabolism within the LEFT vocal cord consistent with impingement upon the LEFT recurrent laryngeal nerve by the mediastinal mass. Incidental CT findings: none CHEST: There is marked decrease in volume of the bulky anterior mediastinal mass compared to CT 03/07/2018. Mass involving the LEFT hemithorax measured roughly 13 cm by 8.6 cm and now measures 7.0 cm by 6.1 cm. The residual LEFT hemithorax mass has only mild-to-moderate peripheral metabolic activity with SUV max  equal 3.9. The central portion of the mass is photopenic consistent with necrosis. Likewise mass involving the superior RIGHT hemithorax is decreased in volume measuring 4.6 by 3.3 cm decreased from 8.0 by 6.7 cm. This RIGHT upper lobe mass also has only peripheral metabolic activity SUV max equal 5.1. The more intense intense activity is associated bronchiectasis and atelectasis of the RIGHT upper lobe. Centrally within the anterior mediastinum there is interval peripheral calcification of the previously ill-defined mass which occupied the anterior mediastinum. The tissue within the anterior mediastinum is mildly hypermetabolic with SUV max equal 4.4. No new or suspicious pulmonary nodules. No axillary adenopathy which is hypermetabolic. Incidental CT findings: none ABDOMEN/PELVIS: Spleen is normal size and normal metabolic activity. No hypermetabolic abdominal or pelvic lymph nodes. Liver is normal. Bowel normal. Physiologic activity noted within the testicles Incidental CT findings: None SKELETON: Mild diffuse marrow activity. No focality. The marrow activity is less than normal liver activity. Physiologic activity noted within the testicles. Incidental CT findings: none IMPRESSION: 1. Marked reduction in volume LEFT upper lobe, mediastinal, and RIGHT upper lobe bulky masses. The residual mass lesions havemild to moderate peripheral hypermetabolic activity with central necrosis. The anterior mediastinal nodal masses are partially calcified. 2. No evidence of disease progression. 3. Normal marrow and spleen. 4. Metabolic activity in the testicles is favored physiologic. 5. 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 Electronically Signed   By: Suzy Bouchard M.D.   On: 04/03/2018 16:08   Ir Imaging Guided Port Insertion  Result Date: 04/22/2018 CLINICAL DATA:  History of B-cell non-Hodgkin's mediastinal lymphoma.  Currently receiving chemotherapy and in need for porta cath for continued chemotherapy and IV access needs. EXAM: IMPLANTED PORT A CATH PLACEMENT WITH ULTRASOUND AND FLUOROSCOPIC GUIDANCE ANESTHESIA/SEDATION: 4.0 mg IV Versed; 150 mcg IV Fentanyl Total Moderate Sedation Time:  42 minutes. The patient's level of consciousness and physiologic status were continuously monitored during the procedure by Radiology nursing. Additional Medications: 2 g IV Ancef. FLUOROSCOPY TIME:  36 seconds.  2.3 mGy. PROCEDURE: The procedure, risks, benefits, and alternatives were explained to the patient. Questions regarding the procedure were encouraged and answered. The patient understands and consents to the procedure. A time-out was performed prior to initiating the procedure. Ultrasound was utilized to confirm patency of the right  internal jugular vein. The right neck and chest were prepped with chlorhexidine in a sterile fashion, and a sterile drape was applied covering the operative field. Maximum barrier sterile technique with sterile gowns and gloves were used for the procedure. Local anesthesia was provided with 1% lidocaine. After creating a small venotomy incision, a 21 gauge needle was advanced into the right internal jugular vein under direct, real-time ultrasound guidance. Ultrasound image documentation was performed. After securing guidewire access, an 8 Fr dilator was placed. A J-wire was kinked to measure appropriate catheter length. A subcutaneous port pocket was then created along the upper chest wall utilizing sharp and blunt dissection. Portable cautery was utilized. The pocket was irrigated with sterile saline. A single lumen power injectable port was chosen for placement. The 8 Fr catheter was tunneled from the port pocket site to the venotomy incision. The port was placed in the pocket. External catheter was trimmed to appropriate length based on guidewire measurement. At the venotomy, an 8 Fr peel-away sheath  was placed over a guidewire. The catheter was then placed through the sheath and the sheath removed. Final catheter positioning was confirmed and documented with a fluoroscopic spot image. The port was accessed with a needle and aspirated and flushed with heparinized saline. The access needle was removed. The venotomy and port pocket incisions were closed with subcutaneous 3-0 Monocryl and subcuticular 4-0 Vicryl. Dermabond was applied to both incisions. COMPLICATIONS: COMPLICATIONS None FINDINGS: After catheter placement, the tip lies at the cavo-atrial junction. The catheter aspirates normally and is ready for immediate use. IMPRESSION: Placement of single lumen port a cath via right internal jugular vein. The catheter tip lies at the cavo-atrial junction. A power injectable port a cath was placed and is ready for immediate use. Electronically Signed   By: Aletta Edouard M.D.   On: 04/22/2018 16:23   [place] Ekg Site Rite  Result Date: 04/10/2018 If Site Rite image not attached, placement could not be confirmed due to current cardiac rhythm.   ASSESSMENT & PLAN:   23 y.o. 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  #2 s/p SVC compression due to mediastinal mass without overt SVC syndrome clinical symptoms #3 small acute distal left upper lobe pulmonary embolus-on lovenox #4 Acute Left IJ and Subclavian DVT due to left sided venous compression due to mass and due to malignancy -on lovenox for malignancy related thrombosis. #4 left-sided pleural effusion and left-sided lung atelectasis due to airway compression from mediastinal mass -resolved on CXR  #5 significant weight loss of 20 pounds likely due to lymphoma- resolving with treatment.  Wt Readings from Last 3 Encounters:  04/28/18 122 lb 9.2 oz (55.6 kg)  04/21/18 124 lb 11.2 oz (56.6 kg)  04/10/18  120 lb (54.4 kg)    #6 drenching night sweats likely has constitutional symptoms from his PMBCL - resolved  #7 Hypokalemia- resolved K 4  PLAN: -Discussed pt labwork today, 04/30/18; potassium slightly low at 3.4, other blood counts and chemistries are stable  -The pt has no prohibitive toxicities from continuing C3D3 EPOCH-R at this time.   -Continue eating well, staying hydrated, and staying as active as reasonably possible  -Continue Eliquis 5mg  po BID -Outpatient Neulasta and Rituxan infusion on 05/05/18 -Recommend salt and baking soda mouthwashes 4-5 times per day -planning for rpt PET/CT after 4 cycles to evaluate treatment response. - Senna S  HS prn for constipation -Will see pt again on C3D12 in clinic for nadir check    The total time spent in the appt was 25 minutes and more than 50% was on counseling and direct patient cares.    Sullivan Lone MD MS AAHIVMS [facility] Hematology/Oncology Physician [facility]  (Office):       [phone] (Work cell):  [phone] (Fax):           [phone]  04/30/2018 4:13 PM   I, Baldwin [place], am acting as a scribe for Dr. Sullivan Lone.   .I have reviewed the above documentation for accuracy and completeness, and I agree with the above. Sullivan Lone MD MS

## 2018-05-01 LAB — COMPREHENSIVE METABOLIC PANEL
ALT: 26 U/L (ref 0–44)
ANION GAP: 7 (ref 5–15)
AST: 14 U/L — ABNORMAL LOW (ref 15–41)
Albumin: 3.7 g/dL (ref 3.5–5.0)
Alkaline Phosphatase: 65 U/L (ref 38–126)
BUN: 13 mg/dL (ref 6–20)
CALCIUM: 9.2 mg/dL (ref 8.9–10.3)
CO2: 27 mmol/L (ref 22–32)
Chloride: 106 mmol/L (ref 98–111)
Creatinine, Ser: 0.6 mg/dL — ABNORMAL LOW (ref 0.61–1.24)
GFR calc Af Amer: >60 mL/min (ref >60)
GFR calc non Af Amer: >60 mL/min (ref >60)
Glucose, Bld: 103 mg/dL — ABNORMAL HIGH (ref 70–99)
Potassium: 3.5 mmol/L (ref 3.5–5.1)
SODIUM: 140 mmol/L (ref 135–145)
Total Bilirubin: 0.4 mg/dL (ref 0.3–1.2)
Total Protein: 6.2 g/dL — ABNORMAL LOW (ref 6.5–8.1)

## 2018-05-01 LAB — CBC
HCT: 32.3 % — ABNORMAL LOW (ref 39.0–52.0)
Hemoglobin: 10 g/dL — ABNORMAL LOW (ref 13.0–17.0)
MCH: 26.8 pg (ref 26.0–34.0)
MCHC: 31 g/dL (ref 30.0–36.0)
MCV: 86.6 fL (ref 80.0–100.0)
NRBC: 0 % (ref 0.0–0.2)
PLATELETS: 390 10*3/uL (ref 150–400)
RBC: 3.73 MIL/uL — ABNORMAL LOW (ref 4.22–5.81)
RDW: 19.9 % — ABNORMAL HIGH (ref 11.5–15.5)
WBC: 9.3 10*3/uL (ref 4.0–10.5)

## 2018-05-01 MED ORDER — SODIUM CHLORIDE 0.9 % IV SOLN
Freq: Once | INTRAVENOUS | Status: AC
  Administered 2018-05-01: 18 mg via INTRAVENOUS
  Filled 2018-05-01: qty 4

## 2018-05-01 MED ORDER — VINCRISTINE SULFATE CHEMO INJECTION 1 MG/ML
Freq: Once | INTRAVENOUS | Status: AC
  Administered 2018-05-01: 13:00:00 via INTRAVENOUS
  Filled 2018-05-01: qty 9

## 2018-05-01 NOTE — Progress Notes (Signed)
HEMATOLOGY/ONCOLOGY INPATIENT PROGRESS NOTE  Date of Service: 05/01/2018  Inpatient Attending: .Brunetta Genera, MD   SUBJECTIVE:   Nicholas Moreno reports that he is doing well overall.   The pt reports that he has not developed any new concerns and denies nausea, vomiting, and diarrhea. He notes that he has had stable energy levels, has been eating well, and has been staying hydrated. He denies abdominal pains or leg swelling.   Lab results today (05/01/18) of CBC w/diff and CMP is as follows: all values are WNL except for RBC at 3.73, HGB at 10.0, HCT at 32.3, RDW at 19.9, Glucose at 103, Creatinine at 0.60, Total Protein at 6.2, AST at 14.  On review of systems, pt reports stable energy levels, eating well, staying hydrated, breathing well, and denies nausea, vomiting, diarrhea, abdominal pains, leg swelling, problems passing urine, skin rashes, and any other symptoms.   OBJECTIVE:  NAD  PHYSICAL EXAMINATION: . Vitals:   04/30/18 2148 05/01/18 0501 05/01/18 1338 05/01/18 1339  BP: 117/74 105/60 109/65   Pulse: 77 66 (!) 101 95  Resp: 16 14 16    Temp: 98.1 F (36.7 C) 98.2 F (36.8 C) 99 F (37.2 C)   TempSrc: Oral Oral Oral   SpO2: 98% 100% 100% 100%  Weight:      Height:       Filed Weights   04/28/18 1507  Weight: 122 lb 9.2 oz (55.6 kg)   .Body mass index is 19.78 kg/m.  GENERAL:alert, in no acute distress and comfortable SKIN: no acute rashes, no significant lesions EYES: conjunctiva are pink and non-injected, sclera anicteric OROPHARYNX: MMM, no exudates, no oropharyngeal erythema or ulceration NECK: supple, no JVD LYMPH:  no palpable lymphadenopathy in the cervical, axillary or inguinal regions LUNGS: clear to auscultation b/l with normal respiratory effort HEART: regular rate & rhythm ABDOMEN:  normoactive bowel sounds , non tender, not distended. No palpable hepatosplenomegaly.  Extremity: no pedal edema PSYCH: alert & oriented x 3 with fluent  speech NEURO: no focal motor/sensory deficits   MEDICAL HISTORY:  History reviewed. No pertinent past medical history.  SURGICAL HISTORY: Past Surgical History:  Procedure Laterality Date  . IR IMAGING GUIDED PORT INSERTION  04/22/2018  . MEDIASTINOSCOPY N/A 03/10/2018   Procedure: MEDIASTINOSCOPY;  Surgeon: Ivin Poot, MD;  Location: [place];  Service: Thoracic;  Laterality: N/A;  . VIDEO ASSISTED THORACOSCOPY (VATS)/ LYMPH NODE SAMPLING Left 03/10/2018   Procedure: VIDEO ASSISTED THORACOSCOPY (VATS)/ BIOSPY ANTERIOR APPROACH;  Surgeon: Ivin Poot, MD;  Location: [place];  Service: Thoracic;  Laterality: Left;  Marland Kitchen VIDEO BRONCHOSCOPY Left 03/10/2018   Procedure: VIDEO BRONCHOSCOPY;  Surgeon: Ivin Poot, MD;  Location: [facility] OR;  Service: Thoracic;  Laterality: Left;    SOCIAL HISTORY: Social History   Socioeconomic History  . Marital status: Single    Spouse name: Not on file  . Number of children: Not on file  . Years of education: Not on file  . Highest education level: Not on file  Occupational History  . Not on file  Social Needs  . Financial resource strain: Not on file  . Food insecurity:    Worry: Not on file    Inability: Not on file  . Transportation needs:    Medical: Not on file    Non-medical: Not on file  Tobacco Use  . Smoking status: Never Smoker  . Smokeless tobacco: Never Used  Substance and Sexual Activity  . Alcohol use: Not Currently  .  Drug use: Not Currently  . Sexual activity: Not on file  Lifestyle  . Physical activity:    Days per week: Not on file    Minutes per session: Not on file  . Stress: Not on file  Relationships  . Social connections:    Talks on phone: Not on file    Gets together: Not on file    Attends religious service: Not on file    Active member of club or organization: Not on file    Attends meetings of clubs or organizations: Not on file    Relationship status: Not on file  . Intimate partner violence:    Fear  of current or ex partner: Not on file    Emotionally abused: Not on file    Physically abused: Not on file    Forced sexual activity: Not on file  Other Topics Concern  . Not on file  Social History Narrative  . Not on file    FAMILY HISTORY: History reviewed. No pertinent family history.  ALLERGIES:  has No Known Allergies.  MEDICATIONS:  Scheduled Meds: . apixaban  5 mg Oral BID  . 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 predniSONE  60 mg Oral QAC breakfast   Continuous Infusions: . sodium chloride 15 mL/hr at 05/01/18 1515   PRN Meds:.acetaminophen, albuterol, Cold Pack, Hot Pack, senna-docusate, traMADol  REVIEW OF SYSTEMS:    A 10+ POINT REVIEW OF SYSTEMS WAS OBTAINED including neurology, dermatology, psychiatry, cardiac, respiratory, lymph, extremities, GI, GU, Musculoskeletal, constitutional, breasts, reproductive, HEENT.  All pertinent positives are noted in the HPI.  All others are negative.    LABORATORY DATA:  I have reviewed the data as listed  CBC Latest Ref Rng & Units 05/01/2018 04/30/2018 04/29/2018  WBC 4.0 - 10.5 K/uL 9.3 16.2(H) 23.1(H)  Hemoglobin 13.0 - 17.0 g/dL 10.0(L) 10.3(L) 10.3(L)  Hematocrit 39.0 - 52.0 % 32.3(L) 32.9(L) 32.9(L)  Platelets 150 - 400 K/uL 390 390 340    . CMP Latest Ref Rng & Units 05/01/2018 04/30/2018 04/29/2018  Glucose 70 - 99 mg/dL 103(H) 125(H) 132(H)  BUN 6 - 20 mg/dL 13 13 10   Creatinine 0.61 - 1.24 mg/dL 0.60(L) 0.60(L) 0.61  Sodium 135 - 145 mmol/L 140 141 139  Potassium 3.5 - 5.1 mmol/L 3.5 3.4(L) 3.9  Chloride 98 - 111 mmol/L 106 108 107  CO2 22 - 32 mmol/L 27 25 24   Calcium 8.9 - 10.3 mg/dL 9.2 9.1 9.4  Total Protein 6.5 - 8.1 g/dL 6.2(L) 6.2(L) 6.5  Total Bilirubin 0.3 - 1.2 mg/dL 0.4 0.6 0.4  Alkaline Phos 38 - 126 U/L 65 73 90  AST 15 - 41 U/L 14(L) 16 19  ALT 0 - 44 U/L 26 32 41    RADIOGRAPHIC STUDIES: I have personally reviewed the radiological images  as listed and agreed with the findings in the report. Dg Chest 2 View  Result Date: 04/11/2018 CLINICAL DATA:  B-cell lymphoma with malignant pleural effusion. EXAM: CHEST - 2 VIEW COMPARISON:  CT scan 04/03/2018 FINDINGS: The heart is normal in size and stable. Stable appearing mediastinal masses when compared to the recent chest CT. Significant improvement since the prior chest x-ray. No residual left pleural effusion is identified. The right PICC line is in good position without complicating features. IMPRESSION: Right PICC line in good position, unchanged. Stable mediastinal/hilar mass is since recent chest CT. No persistent/residual left pleural effusion. Electronically Signed   By: Mamie Nick.  Gallerani M.D.   On: 04/11/2018 09:46   Nm Pet Image Initial (pi) Skull Base To Thigh  Result Date: 04/03/2018 CLINICAL DATA:  Subsequent treatment strategy for non-Hodgkin's lymphoma. B-cell lymphoma. Mediastinal mass. EXAM: NUCLEAR MEDICINE PET SKULL BASE TO THIGH TECHNIQUE: 6.4 mCi F-18 FDG was injected intravenously. Full-ring PET imaging was performed from the skull base to thigh after the radiotracer. CT data was obtained and used for attenuation correction and anatomic localization. Fasting blood glucose: 82 mg/dl COMPARISON:  Chest CT 03/07/2018 FINDINGS: Mediastinal blood pool activity: SUV max 1.37 NECK: No hypermetabolic lymph nodes in the neck. There is decreased metabolism within the LEFT vocal cord consistent with impingement upon the LEFT recurrent laryngeal nerve by the mediastinal mass. Incidental CT findings: none CHEST: There is marked decrease in volume of the bulky anterior mediastinal mass compared to CT 03/07/2018. Mass involving the LEFT hemithorax measured roughly 13 cm by 8.6 cm and now measures 7.0 cm by 6.1 cm. The residual LEFT hemithorax mass has only mild-to-moderate peripheral metabolic activity with SUV max equal 3.9. The central portion of the mass is photopenic consistent with necrosis.  Likewise mass involving the superior RIGHT hemithorax is decreased in volume measuring 4.6 by 3.3 cm decreased from 8.0 by 6.7 cm. This RIGHT upper lobe mass also has only peripheral metabolic activity SUV max equal 5.1. The more intense intense activity is associated bronchiectasis and atelectasis of the RIGHT upper lobe. Centrally within the anterior mediastinum there is interval peripheral calcification of the previously ill-defined mass which occupied the anterior mediastinum. The tissue within the anterior mediastinum is mildly hypermetabolic with SUV max equal 4.4. No new or suspicious pulmonary nodules. No axillary adenopathy which is hypermetabolic. Incidental CT findings: none ABDOMEN/PELVIS: Spleen is normal size and normal metabolic activity. No hypermetabolic abdominal or pelvic lymph nodes. Liver is normal. Bowel normal. Physiologic activity noted within the testicles Incidental CT findings: None SKELETON: Mild diffuse marrow activity. No focality. The marrow activity is less than normal liver activity. Physiologic activity noted within the testicles. Incidental CT findings: none IMPRESSION: 1. Marked reduction in volume LEFT upper lobe, mediastinal, and RIGHT upper lobe bulky masses. The residual mass lesions havemild to moderate peripheral hypermetabolic activity with central necrosis. The anterior mediastinal nodal masses are partially calcified. 2. No evidence of disease progression. 3. Normal marrow and spleen. 4. Metabolic activity in the testicles is favored physiologic. 5. 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 Electronically Signed   By: Suzy Bouchard M.D.   On: 04/03/2018 16:08   Ir Imaging Guided Port Insertion  Result Date: 04/22/2018 CLINICAL DATA:  History of B-cell non-Hodgkin's mediastinal lymphoma. Currently receiving chemotherapy and in need for porta cath for continued chemotherapy and  IV access needs. EXAM: IMPLANTED PORT A CATH PLACEMENT WITH ULTRASOUND AND FLUOROSCOPIC GUIDANCE ANESTHESIA/SEDATION: 4.0 mg IV Versed; 150 mcg IV Fentanyl Total Moderate Sedation Time:  42 minutes. The patient's level of consciousness and physiologic status were continuously monitored during the procedure by Radiology nursing. Additional Medications: 2 g IV Ancef. FLUOROSCOPY TIME:  36 seconds.  2.3 mGy. PROCEDURE: The procedure, risks, benefits, and alternatives were explained to the patient. Questions regarding the procedure were encouraged and answered. The patient understands and consents to the procedure. A time-out was performed prior to initiating the procedure. Ultrasound was utilized to confirm patency of the right internal jugular vein. The right neck and chest were prepped with chlorhexidine in a  sterile fashion, and a sterile drape was applied covering the operative field. Maximum barrier sterile technique with sterile gowns and gloves were used for the procedure. Local anesthesia was provided with 1% lidocaine. After creating a small venotomy incision, a 21 gauge needle was advanced into the right internal jugular vein under direct, real-time ultrasound guidance. Ultrasound image documentation was performed. After securing guidewire access, an 8 Fr dilator was placed. A J-wire was kinked to measure appropriate catheter length. A subcutaneous port pocket was then created along the upper chest wall utilizing sharp and blunt dissection. Portable cautery was utilized. The pocket was irrigated with sterile saline. A single lumen power injectable port was chosen for placement. The 8 Fr catheter was tunneled from the port pocket site to the venotomy incision. The port was placed in the pocket. External catheter was trimmed to appropriate length based on guidewire measurement. At the venotomy, an 8 Fr peel-away sheath was placed over a guidewire. The catheter was then placed through the sheath and the sheath  removed. Final catheter positioning was confirmed and documented with a fluoroscopic spot image. The port was accessed with a needle and aspirated and flushed with heparinized saline. The access needle was removed. The venotomy and port pocket incisions were closed with subcutaneous 3-0 Monocryl and subcuticular 4-0 Vicryl. Dermabond was applied to both incisions. COMPLICATIONS: COMPLICATIONS None FINDINGS: After catheter placement, the tip lies at the cavo-atrial junction. The catheter aspirates normally and is ready for immediate use. IMPRESSION: Placement of single lumen port a cath via right internal jugular vein. The catheter tip lies at the cavo-atrial junction. A power injectable port a cath was placed and is ready for immediate use. Electronically Signed   By: Aletta Edouard M.D.   On: 04/22/2018 16:23   [place] Ekg Site Rite  Result Date: 04/10/2018 If Site Rite image not attached, placement could not be confirmed due to current cardiac rhythm.   ASSESSMENT & PLAN:   23 y.o. 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  #2 s/p SVC compression due to mediastinal mass without overt SVC syndrome clinical symptoms #3 small acute distal left upper lobe pulmonary embolus-on lovenox #4 Acute Left IJ and Subclavian DVT due to left sided venous compression due to mass and due to malignancy -on lovenox for malignancy related thrombosis. #4 left-sided pleural effusion and left-sided lung atelectasis due to airway compression from mediastinal mass -resolved on CXR  #5 significant weight loss of 20 pounds likely due to lymphoma- resolving with treatment.  Wt Readings from Last 3 Encounters:  04/28/18 122 lb 9.2 oz (55.6 kg)  04/21/18 124 lb 11.2 oz (56.6 kg)  04/10/18 120 lb (54.4 kg)    #6 drenching night sweats likely has constitutional symptoms from  his PMBCL - resolved  #7 Hypokalemia- resolved   PLAN: -Discussed pt labwork today, 05/01/18; blood counts and chemistries are stable -The pt has no prohibitive toxicities from continuing C3D4 EPOCH-R at this time.   -Continue eating well, staying hydrated, and staying as active as reasonably possible   -Continue Eliquis 5mg  po BID -Outpatient Neulasta and Rituxan infusion on 05/05/18 -Recommend salt and baking soda mouthwashes 4-5 times per day -planning for rpt PET/CT after 4 cycles to evaluate treatment response. - Senna S HS prn for constipation -Will see pt again on C3D12 in clinic for nadir check    The total time  spent in the appt was 25 minutes and more than 50% was on counseling and direct patient cares.    Sullivan Lone MD MS AAHIVMS [facility]  (Office):       [phone] (Work cell):  [phone] (Fax):           [phone]  05/01/2018 3:55 PM   I, Baldwin [place], am acting as a scribe for Dr. Sullivan Lone.   .I have reviewed the above documentation for accuracy and completeness, and I agree with the above. Sullivan Lone MD MS

## 2018-05-02 LAB — CBC
HCT: 31.1 % — ABNORMAL LOW (ref 39.0–52.0)
Hemoglobin: 9.9 g/dL — ABNORMAL LOW (ref 13.0–17.0)
MCH: 27.1 pg (ref 26.0–34.0)
MCHC: 31.8 g/dL (ref 30.0–36.0)
MCV: 85.2 fL (ref 80.0–100.0)
Platelets: 396 10*3/uL (ref 150–400)
RBC: 3.65 MIL/uL — ABNORMAL LOW (ref 4.22–5.81)
RDW: 19.1 % — AB (ref 11.5–15.5)
WBC: 7.3 10*3/uL (ref 4.0–10.5)
nRBC: 0 % (ref 0.0–0.2)

## 2018-05-02 LAB — COMPREHENSIVE METABOLIC PANEL
ALBUMIN: 3.5 g/dL (ref 3.5–5.0)
ALT: 26 U/L (ref 0–44)
AST: 15 U/L (ref 15–41)
Alkaline Phosphatase: 62 U/L (ref 38–126)
Anion gap: 10 (ref 5–15)
BUN: 15 mg/dL (ref 6–20)
CO2: 26 mmol/L (ref 22–32)
CREATININE: 0.59 mg/dL — AB (ref 0.61–1.24)
Calcium: 9.2 mg/dL (ref 8.9–10.3)
Chloride: 104 mmol/L (ref 98–111)
GFR calc Af Amer: >60 mL/min (ref >60)
GFR calc non Af Amer: >60 mL/min (ref >60)
GLUCOSE: 106 mg/dL — AB (ref 70–99)
Potassium: 3.4 mmol/L — ABNORMAL LOW (ref 3.5–5.1)
Sodium: 140 mmol/L (ref 135–145)
Total Bilirubin: 0.6 mg/dL (ref 0.3–1.2)
Total Protein: 5.9 g/dL — ABNORMAL LOW (ref 6.5–8.1)

## 2018-05-02 MED ORDER — HEPARIN SOD (PORK) LOCK FLUSH 100 UNIT/ML IV SOLN
500.0000 [IU] | Freq: Once | INTRAVENOUS | Status: AC
  Administered 2018-05-02: 500 [IU] via INTRAVENOUS
  Filled 2018-05-02: qty 5

## 2018-05-02 MED ORDER — APIXABAN 5 MG PO TABS: 5.0000 mg | ORAL_TABLET | Freq: Two times a day (BID) | ORAL | 4 refills | Status: DC

## 2018-05-02 MED ORDER — SODIUM CHLORIDE 0.9 % IV SOLN
Freq: Once | INTRAVENOUS | Status: AC
  Administered 2018-05-02: 36 mg via INTRAVENOUS
  Filled 2018-05-02: qty 8

## 2018-05-02 MED ORDER — SODIUM CHLORIDE 0.9 % IV SOLN
900.0000 mg/m2 | Freq: Once | INTRAVENOUS | Status: AC
  Administered 2018-05-02: 1420 mg via INTRAVENOUS
  Filled 2018-05-02: qty 71

## 2018-05-02 NOTE — Discharge Summary (Signed)
.  [place]  [phone] [phone]    Physician Discharge Summary     Patient ID: Nicholas Moreno MRN: [id number] DOB/AGE: [birth year] 23 y.o.  Admit date: 04/28/2018 Discharge date: 05/02/2018  Primary Care Physician:  Patient, No Pcp Per   Discharge Diagnoses:    Present on Admission: . 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   apixaban 5 MG Tabs tablet Commonly known as:  ELIQUIS Take 1 tablet (5 mg total) by mouth 2 (two) times daily.   fluticasone 110 MCG/ACT inhaler Commonly known as:  FLOVENT HFA Inhale 2 puffs into the lungs 2 (two) times daily as needed (asthma related symptoms).   polyethylene glycol packet Commonly known as:  MIRALAX / GLYCOLAX Take 17 g by mouth daily.   senna-docusate 8.6-50 MG tablet Commonly known as:  Senokot-S Take 2 tablets by mouth at bedtime as needed for mild constipation.   traMADol 50 MG tablet Commonly known as:  ULTRAM Take 1 tablet (50 mg total) by mouth every 6 (six) hours as needed (mild pain).        Disposition and Follow-up:   Significant Diagnostic Studies:  Dg Chest 2 View  Result Date: 04/11/2018 CLINICAL DATA:  B-cell lymphoma with malignant pleural effusion. EXAM: CHEST - 2 VIEW COMPARISON:  CT scan 04/03/2018 FINDINGS: The heart is normal in size and stable. Stable appearing mediastinal masses when compared to the recent chest CT. Significant improvement since the prior chest x-ray. No residual left pleural effusion  is identified. The right PICC line is in good position without complicating features. IMPRESSION: Right PICC line in good position, unchanged. Stable mediastinal/hilar mass is since recent chest CT. No persistent/residual left pleural effusion. Electronically Signed   By: Marijo Sanes M.D.   On: 04/11/2018 09:46   Nm Pet Image Initial (pi) Skull Base To Thigh  Result Date: 04/03/2018 CLINICAL DATA:  Subsequent treatment strategy for non-Hodgkin's lymphoma. B-cell lymphoma. Mediastinal mass. EXAM: NUCLEAR MEDICINE PET SKULL BASE TO THIGH TECHNIQUE: 6.4 mCi F-18 FDG was injected intravenously. Full-ring PET imaging was performed from the skull base to thigh after the radiotracer. CT data was obtained and used for attenuation correction and anatomic localization. Fasting blood glucose: 82 mg/dl COMPARISON:  Chest CT 03/07/2018 FINDINGS: Mediastinal blood pool activity: SUV max 1.37 NECK: No hypermetabolic lymph nodes in the neck. There is decreased metabolism within the LEFT vocal cord consistent with impingement upon the LEFT recurrent laryngeal nerve by the mediastinal mass. Incidental CT findings: none CHEST: There is marked decrease in volume of the bulky anterior mediastinal mass compared to CT 03/07/2018. Mass involving the LEFT hemithorax measured roughly 13 cm by 8.6 cm and now measures 7.0 cm by 6.1 cm. The residual LEFT hemithorax mass has only mild-to-moderate peripheral metabolic activity with SUV max equal 3.9. The central portion of the mass is photopenic consistent with necrosis. Likewise mass involving the superior RIGHT hemithorax is decreased in volume measuring 4.6 by 3.3 cm decreased from 8.0 by 6.7 cm. This RIGHT upper  lobe mass also has only peripheral metabolic activity SUV max equal 5.1. The more intense intense activity is associated bronchiectasis and atelectasis of the RIGHT upper lobe. Centrally within the anterior mediastinum there is interval peripheral calcification of the  previously ill-defined mass which occupied the anterior mediastinum. The tissue within the anterior mediastinum is mildly hypermetabolic with SUV max equal 4.4. No new or suspicious pulmonary nodules. No axillary adenopathy which is hypermetabolic. Incidental CT findings: none ABDOMEN/PELVIS: Spleen is normal size and normal metabolic activity. No hypermetabolic abdominal or pelvic lymph nodes. Liver is normal. Bowel normal. Physiologic activity noted within the testicles Incidental CT findings: None SKELETON: Mild diffuse marrow activity. No focality. The marrow activity is less than normal liver activity. Physiologic activity noted within the testicles. Incidental CT findings: none IMPRESSION: 1. Marked reduction in volume LEFT upper lobe, mediastinal, and RIGHT upper lobe bulky masses. The residual mass lesions havemild to moderate peripheral hypermetabolic activity with central necrosis. The anterior mediastinal nodal masses are partially calcified. 2. No evidence of disease progression. 3. Normal marrow and spleen. 4. Metabolic activity in the testicles is favored physiologic. 5. 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 Electronically Signed   By: Suzy Bouchard M.D.   On: 04/03/2018 16:08   Ir Imaging Guided Port Insertion  Result Date: 04/22/2018 CLINICAL DATA:  History of B-cell non-Hodgkin's mediastinal lymphoma. Currently receiving chemotherapy and in need for porta cath for continued chemotherapy and IV access needs. EXAM: IMPLANTED PORT A CATH PLACEMENT WITH ULTRASOUND AND FLUOROSCOPIC GUIDANCE ANESTHESIA/SEDATION: 4.0 mg IV Versed; 150 mcg IV Fentanyl Total Moderate Sedation Time:  42 minutes. The patient's level of consciousness and physiologic status were continuously monitored during the procedure by Radiology nursing. Additional Medications: 2 g IV Ancef. FLUOROSCOPY TIME:  36 seconds.  2.3 mGy.  PROCEDURE: The procedure, risks, benefits, and alternatives were explained to the patient. Questions regarding the procedure were encouraged and answered. The patient understands and consents to the procedure. A time-out was performed prior to initiating the procedure. Ultrasound was utilized to confirm patency of the right internal jugular vein. The right neck and chest were prepped with chlorhexidine in a sterile fashion, and a sterile drape was applied covering the operative field. Maximum barrier sterile technique with sterile gowns and gloves were used for the procedure. Local anesthesia was provided with 1% lidocaine. After creating a small venotomy incision, a 21 gauge needle was advanced into the right internal jugular vein under direct, real-time ultrasound guidance. Ultrasound image documentation was performed. After securing guidewire access, an 8 Fr dilator was placed. A J-wire was kinked to measure appropriate catheter length. A subcutaneous port pocket was then created along the upper chest wall utilizing sharp and blunt dissection. Portable cautery was utilized. The pocket was irrigated with sterile saline. A single lumen power injectable port was chosen for placement. The 8 Fr catheter was tunneled from the port pocket site to the venotomy incision. The port was placed in the pocket. External catheter was trimmed to appropriate length based on guidewire measurement. At the venotomy, an 8 Fr peel-away sheath was placed over a guidewire. The catheter was then placed through the sheath and the sheath removed. Final catheter positioning was confirmed and documented with a fluoroscopic spot image. The port was accessed with a needle and aspirated and flushed with heparinized saline. The access needle was removed. The venotomy and port pocket incisions were closed with subcutaneous 3-0  Monocryl and subcuticular 4-0 Vicryl. Dermabond was applied to both incisions. COMPLICATIONS: COMPLICATIONS None FINDINGS:  After catheter placement, the tip lies at the cavo-atrial junction. The catheter aspirates normally and is ready for immediate use. IMPRESSION: Placement of single lumen port a cath via right internal jugular vein. The catheter tip lies at the cavo-atrial junction. A power injectable port a cath was placed and is ready for immediate use. Electronically Signed   By: Aletta Edouard M.D.   On: 04/22/2018 16:23   [place] Ekg Site Rite  Result Date: 04/10/2018 If Site Rite image not attached, placement could not be confirmed due to current cardiac rhythm.   Discharge Laboratory Values: . CBC Latest Ref Rng & Units 05/02/2018 05/01/2018 04/30/2018  WBC 4.0 - 10.5 K/uL 7.3 9.3 16.2(H)  Hemoglobin 13.0 - 17.0 g/dL 9.9(L) 10.0(L) 10.3(L)  Hematocrit 39.0 - 52.0 % 31.1(L) 32.3(L) 32.9(L)  Platelets 150 - 400 K/uL 396 390 390   . CMP Latest Ref Rng & Units 05/02/2018 05/01/2018 04/30/2018  Glucose 70 - 99 mg/dL 106(H) 103(H) 125(H)  BUN 6 - 20 mg/dL 15 13 13   Creatinine 0.61 - 1.24 mg/dL 0.59(L) 0.60(L) 0.60(L)  Sodium 135 - 145 mmol/L 140 140 141  Potassium 3.5 - 5.1 mmol/L 3.4(L) 3.5 3.4(L)  Chloride 98 - 111 mmol/L 104 106 108  CO2 22 - 32 mmol/L 26 27 25   Calcium 8.9 - 10.3 mg/dL 9.2 9.2 9.1  Total Protein 6.5 - 8.1 g/dL 5.9(L) 6.2(L) 6.2(L)  Total Bilirubin 0.3 - 1.2 mg/dL 0.6 0.4 0.6  Alkaline Phos 38 - 126 U/L 62 65 73  AST 15 - 41 U/L 15 14(L) 16  ALT 0 - 44 U/L 26 26 32     Brief H and P: For complete details please refer to admission H and P, but in brief,  Nicholas Moreno is a wonderful 23 y.o. male who has been admitted today for [place] treatment for his Primary Mediastinal B-Cell Non-Hodgkin's lymphoma. The pt reports that he has not developed any new concerns in the interim. He notes that he has been eating well and has been staying hydrated. He denies any fevers, chills, and concerns for infections. The pt had his port placed in the interim and denies any pain associated with the  area, and denies any hand, arm, or leg swelling.   Issues during hospitalization 23 y.o. male with  #1Recently diagnosed Primary Mediastinal B cell Non Hodgkins lymphoma c-MYC negative Not a double hit lymphoma Pathology confirmed by pathology read at [facility].  #2 s/p SVC compression due to mediastinal mass without overt SVC syndrome clinical symptoms #3 small acute distal left upper lobe pulmonary embolus-on lovenox #4 Acute Left IJ and Subclavian DVT due to left sided venous compression due to mass and due to malignancy -was on lovenox for malignancy related thrombosis -- now changed to Eliquis #4 left-sided pleural effusion and left-sided lung atelectasis due to airway compression from mediastinal mass -resolved on CXR  #5 significant weight loss of 20 pounds likely due to lymphoma- resolving with treatment.     Wt Readings from Last 3 Encounters:  04/28/18 122 lb 9.2 oz (55.6 kg)  04/21/18 124 lb 11.2 oz (56.6 kg)  04/10/18 120 lb (54.4 kg)    #6 drenching night sweats likely has constitutional symptoms from his PMBCL - resolved  #7 Hypokalemia- K 3.4 --- likely due to high dose steroids. Recommended increased po potassium intake.  PLAN: -Discussed pt labwork today, 05/02/18;  blood counts and chemistries are stable -The pt has no prohibitive toxicities from continuing C3D5 EPOCH-R at this time.   -Continue eating well, staying hydrated, and staying as active as reasonably possible   -Continue Eliquis5mg  po BID (lovenox changed to Eliquis). -Outpatient Neulasta and Rituxan infusion on 05/05/18 -Recommend salt and baking soda mouthwashes 4-5 times per day -planning for rpt PET/CT after 4 cycles to evaluate treatment response. - Senna S HS prn for constipation -Will see pt again on C3D12 in clinic for nadir check   Physical Exam at Discharge: BP 103/66 (BP Location: Right Arm)   Pulse 84   Temp 98.4 F (36.9 C) (Oral)   Resp 16   Ht 5\' 6"  (1.676  m)   Wt 122 lb 9.2 oz (55.6 kg)   SpO2 100%   BMI 19.78 kg/m  . GENERAL:alert, in no acute distress and comfortable SKIN: no acute rashes, no significant lesions EYES: conjunctiva are pink and non-injected, sclera anicteric OROPHARYNX: MMM, no exudates, no oropharyngeal erythema or ulceration NECK: supple, no JVD LYMPH:  no palpable lymphadenopathy in the cervical, axillary or inguinal regions LUNGS: clear to auscultation b/l with normal respiratory effort HEART: regular rate & rhythm ABDOMEN:  normoactive bowel sounds , non tender, not distended. 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   Condition at Discharge:   Stable  Signed: Dr. Sullivan Lone MD [place] [phone]  05/02/2018, 12:30 PM   TT spent discharging patient >75mins

## 2018-05-05 ENCOUNTER — Inpatient Hospital Stay: Payer: BLUE CROSS/BLUE SHIELD

## 2018-05-05 VITALS — BP 110/61 | HR 93 | Temp 99.0°F | Resp 16

## 2018-05-05 DIAGNOSIS — C8528 Mediastinal (thymic) large B-cell lymphoma, lymph nodes of multiple sites: Secondary | ICD-10-CM

## 2018-05-05 DIAGNOSIS — Z79899 Other long term (current) drug therapy: Secondary | ICD-10-CM | POA: Diagnosis not present

## 2018-05-05 DIAGNOSIS — I82402 Acute embolism and thrombosis of unspecified deep veins of left lower extremity: Secondary | ICD-10-CM | POA: Diagnosis not present

## 2018-05-05 DIAGNOSIS — J910 Malignant pleural effusion: Secondary | ICD-10-CM | POA: Diagnosis not present

## 2018-05-05 DIAGNOSIS — Z5112 Encounter for antineoplastic immunotherapy: Secondary | ICD-10-CM | POA: Diagnosis not present

## 2018-05-05 DIAGNOSIS — Z5189 Encounter for other specified aftercare: Secondary | ICD-10-CM | POA: Diagnosis not present

## 2018-05-05 MED ORDER — ACETAMINOPHEN 325 MG PO TABS
650.0000 mg | ORAL_TABLET | Freq: Once | ORAL | Status: AC
  Administered 2018-05-05: 650 mg via ORAL

## 2018-05-05 MED ORDER — DIPHENHYDRAMINE HCL 25 MG PO CAPS
50.0000 mg | ORAL_CAPSULE | Freq: Once | ORAL | Status: AC
  Administered 2018-05-05: 50 mg via ORAL

## 2018-05-05 MED ORDER — DIPHENHYDRAMINE HCL 25 MG PO CAPS
ORAL_CAPSULE | ORAL | Status: AC
  Filled 2018-05-05: qty 2

## 2018-05-05 MED ORDER — PEGFILGRASTIM-CBQV 6 MG/0.6ML ~~LOC~~ SOSY
PREFILLED_SYRINGE | SUBCUTANEOUS | Status: AC
  Filled 2018-05-05: qty 0.6

## 2018-05-05 MED ORDER — HEPARIN SOD (PORK) LOCK FLUSH 100 UNIT/ML IV SOLN
500.0000 [IU] | Freq: Once | INTRAVENOUS | Status: AC | PRN
  Administered 2018-05-05: 500 [IU]
  Filled 2018-05-05: qty 5

## 2018-05-05 MED ORDER — SODIUM CHLORIDE 0.9% FLUSH
10.0000 mL | INTRAVENOUS | Status: DC | PRN
  Administered 2018-05-05: 10 mL
  Filled 2018-05-05: qty 10

## 2018-05-05 MED ORDER — PEGFILGRASTIM-CBQV 6 MG/0.6ML ~~LOC~~ SOSY
6.0000 mg | PREFILLED_SYRINGE | Freq: Once | SUBCUTANEOUS | Status: AC
  Administered 2018-05-05: 6 mg via SUBCUTANEOUS

## 2018-05-05 MED ORDER — SODIUM CHLORIDE 0.9 % IV SOLN
375.0000 mg/m2 | Freq: Once | INTRAVENOUS | Status: AC
  Administered 2018-05-05: 600 mg via INTRAVENOUS
  Filled 2018-05-05: qty 50

## 2018-05-05 MED ORDER — SODIUM CHLORIDE 0.9 % IV SOLN
Freq: Once | INTRAVENOUS | Status: AC
  Administered 2018-05-05: 08:00:00 via INTRAVENOUS
  Filled 2018-05-05: qty 250

## 2018-05-05 MED ORDER — ACETAMINOPHEN 325 MG PO TABS
ORAL_TABLET | ORAL | Status: AC
  Filled 2018-05-05: qty 2

## 2018-05-05 NOTE — Patient Instructions (Signed)
[facility] Discharge Instructions for Patients Receiving Chemotherapy  Today you received the following chemotherapy agents Rituxan  To help prevent nausea and vomiting after your treatment, we encourage you to take your nausea medication as prescribed.   If you develop nausea and vomiting that is not controlled by your nausea medication, call the clinic.   BELOW ARE SYMPTOMS THAT SHOULD BE REPORTED IMMEDIATELY:  *FEVER GREATER THAN 100.5 F  *CHILLS WITH OR WITHOUT FEVER  NAUSEA AND VOMITING THAT IS NOT CONTROLLED WITH YOUR NAUSEA MEDICATION  *UNUSUAL SHORTNESS OF BREATH  *UNUSUAL BRUISING OR BLEEDING  TENDERNESS IN MOUTH AND THROAT WITH OR WITHOUT PRESENCE OF ULCERS  *URINARY PROBLEMS  *BOWEL PROBLEMS  UNUSUAL RASH Items with * indicate a potential emergency and should be followed up as soon as possible.  Feel free to call the clinic should you have any questions or concerns. The clinic phone number is [phone].  Please show the [facility] at check-in to the Emergency Department and triage nurse.  Rituximab injection What is this medicine? RITUXIMAB (ri TUX i mab) is a monoclonal antibody. It is used to treat certain types of cancer like non-Hodgkin lymphoma and chronic lymphocytic leukemia. It is also used to treat rheumatoid arthritis, granulomatosis with polyangiitis (or Wegener's granulomatosis), microscopic polyangiitis, and pemphigus vulgaris. This medicine may be used for other purposes; ask your health care provider or pharmacist if you have questions. COMMON BRAND NAME(S): Rituxan What should I tell my health care provider before I take this medicine? They need to know if you have any of these conditions: -heart disease -infection (especially a virus infection such as hepatitis B, chickenpox, cold sores, or herpes) -immune system problems -irregular heartbeat -kidney disease -low blood counts, like low white cell, platelet, or red  cell counts -lung or breathing disease, like asthma -recently received or scheduled to receive a vaccine -an unusual or allergic reaction to rituximab, other medicines, foods, dyes, or preservatives -pregnant or trying to get pregnant -breast-feeding How should I use this medicine? This medicine is for infusion into a vein. It is administered in a hospital or clinic by a specially trained health care professional. A special MedGuide will be given to you by the pharmacist with each prescription and refill. Be sure to read this information carefully each time. Talk to your pediatrician regarding the use of this medicine in children. This medicine is not approved for use in children. Overdosage: If you think you have taken too much of this medicine contact a poison control center or emergency room at once. NOTE: This medicine is only for you. Do not share this medicine with others. What if I miss a dose? It is important not to miss a dose. Call your doctor or health care professional if you are unable to keep an appointment. What may interact with this medicine? -cisplatin -live virus vaccines This list may not describe all possible interactions. Give your health care provider a list of all the medicines, herbs, non-prescription drugs, or dietary supplements you use. Also tell them if you smoke, drink alcohol, or use illegal drugs. Some items may interact with your medicine. What should I watch for while using this medicine? Your condition will be monitored carefully while you are receiving this medicine. You may need blood work done while you are taking this medicine. This medicine can cause serious allergic reactions. To reduce your risk you may need to take medicine before treatment with this medicine. Take your medicine as directed.  In some patients, this medicine may cause a serious brain infection that may cause death. If you have any problems seeing, thinking, speaking, walking, or standing,  tell your healthcare professional right away. If you cannot reach your healthcare professional, urgently seek other source of medical care. Call your doctor or health care professional for advice if you get a fever, chills or sore throat, or other symptoms of a cold or flu. Do not treat yourself. This drug decreases your body's ability to fight infections. Try to avoid being around people who are sick. Do not become pregnant while taking this medicine or for 12 months after stopping it. Women should inform their doctor if they wish to become pregnant or think they might be pregnant. There is a potential for serious side effects to an unborn child. Talk to your health care professional or pharmacist for more information. Do not breast-feed an infant while taking this medicine or for 6 months after stopping it. What side effects may I notice from receiving this medicine? Side effects that you should report to your doctor or health care professional as soon as possible: -allergic reactions like skin rash, itching or hives; swelling of the face, lips, or tongue -breathing problems -chest pain -changes in vision -diarrhea -headache with fever, neck stiffness, sensitivity to light, nausea, or confusion -fast, irregular heartbeat -loss of memory -low blood counts - this medicine may decrease the number of white blood cells, red blood cells and platelets. You may be at increased risk for infections and bleeding. -mouth sores -problems with balance, talking, or walking -redness, blistering, peeling or loosening of the skin, including inside the mouth -signs of infection - fever or chills, cough, sore throat, pain or difficulty passing urine -signs and symptoms of kidney injury like trouble passing urine or change in the amount of urine -signs and symptoms of liver injury like dark yellow or brown urine; general ill feeling or flu-like symptoms; light-colored stools; loss of appetite; nausea; right upper  belly pain; unusually weak or tired; yellowing of the eyes or skin -signs and symptoms of low blood pressure like dizziness; feeling faint or lightheaded, falls; unusually weak or tired -stomach pain -swelling of the ankles, feet, hands -unusual bleeding or bruising -vomiting Side effects that usually do not require medical attention (report to your doctor or health care professional if they continue or are bothersome): -headache -joint pain -muscle cramps or muscle pain -nausea -tiredness This list may not describe all possible side effects. Call your doctor for medical advice about side effects. You may report side effects to FDA at 1-800-FDA-1088. Where should I keep my medicine? This drug is given in a hospital or clinic and will not be stored at home. NOTE: This sheet is a summary. It may not cover all possible information. If you have questions about this medicine, talk to your doctor, pharmacist, or health care provider.  2019 Elsevier/Gold Standard (2017-03-08 13:04:32)   Pegfilgrastim injection Ellen Henri) What is this medicine? PEGFILGRASTIM (PEG fil gra stim) is a long-acting granulocyte colony-stimulating factor that stimulates the growth of neutrophils, a type of white blood cell important in the body's fight against infection. It is used to reduce the incidence of fever and infection in patients with certain types of cancer who are receiving chemotherapy that affects the bone marrow, and to increase survival after being exposed to high doses of radiation. This medicine may be used for other purposes; ask your health care provider or pharmacist if you have questions. COMMON BRAND  NAME(S): Domenic Moras, UDENYCA What should I tell my health care provider before I take this medicine? They need to know if you have any of these conditions: -kidney disease -latex allergy -ongoing radiation therapy -sickle cell disease -skin reactions to acrylic adhesives (On-Body Injector  only) -an unusual or allergic reaction to pegfilgrastim, filgrastim, other medicines, foods, dyes, or preservatives -pregnant or trying to get pregnant -breast-feeding How should I use this medicine? This medicine is for injection under the skin. If you get this medicine at home, you will be taught how to prepare and give the pre-filled syringe or how to use the On-body Injector. Refer to the patient Instructions for Use for detailed instructions. Use exactly as directed. Tell your healthcare provider immediately if you suspect that the On-body Injector may not have performed as intended or if you suspect the use of the On-body Injector resulted in a missed or partial dose. It is important that you put your used needles and syringes in a special sharps container. Do not put them in a trash can. If you do not have a sharps container, call your pharmacist or healthcare provider to get one. Talk to your pediatrician regarding the use of this medicine in children. While this drug may be prescribed for selected conditions, precautions do apply. Overdosage: If you think you have taken too much of this medicine contact a poison control center or emergency room at once. NOTE: This medicine is only for you. Do not share this medicine with others. What if I miss a dose? It is important not to miss your dose. Call your doctor or health care professional if you miss your dose. If you miss a dose due to an On-body Injector failure or leakage, a new dose should be administered as soon as possible using a single prefilled syringe for manual use. What may interact with this medicine? Interactions have not been studied. Give your health care provider a list of all the medicines, herbs, non-prescription drugs, or dietary supplements you use. Also tell them if you smoke, drink alcohol, or use illegal drugs. Some items may interact with your medicine. This list may not describe all possible interactions. Give your health  care provider a list of all the medicines, herbs, non-prescription drugs, or dietary supplements you use. Also tell them if you smoke, drink alcohol, or use illegal drugs. Some items may interact with your medicine. What should I watch for while using this medicine? You may need blood work done while you are taking this medicine. If you are going to need a MRI, CT scan, or other procedure, tell your doctor that you are using this medicine (On-Body Injector only). What side effects may I notice from receiving this medicine? Side effects that you should report to your doctor or health care professional as soon as possible: -allergic reactions like skin rash, itching or hives, swelling of the face, lips, or tongue -back pain -dizziness -fever -pain, redness, or irritation at site where injected -pinpoint red spots on the skin -red or dark-brown urine -shortness of breath or breathing problems -stomach or side pain, or pain at the shoulder -swelling -tiredness -trouble passing urine or change in the amount of urine Side effects that usually do not require medical attention (report to your doctor or health care professional if they continue or are bothersome): -bone pain -muscle pain This list may not describe all possible side effects. Call your doctor for medical advice about side effects. You may report side effects to FDA  at 1-800-FDA-1088. Where should I keep my medicine? Keep out of the reach of children. If you are using this medicine at home, you will be instructed on how to store it. Throw away any unused medicine after the expiration date on the label. NOTE: This sheet is a summary. It may not cover all possible information. If you have questions about this medicine, talk to your doctor, pharmacist, or health care provider.  2019 Elsevier/Gold Standard (2017-07-01 16:57:08)

## 2018-05-12 ENCOUNTER — Other Ambulatory Visit: Payer: BLUE CROSS/BLUE SHIELD

## 2018-05-12 ENCOUNTER — Ambulatory Visit: Payer: BLUE CROSS/BLUE SHIELD | Admitting: Hematology

## 2018-05-12 NOTE — Progress Notes (Signed)
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 Patient notes that he started feeling unwell in August when he noted gradually increasing fatigue and weight loss.  Patient notes subsequently in September and October he developed new cough with progressive shortness of breath which triggered multiple visits with his primary care physician and was treated symptomatically and with inhalers.  Patient reports no imaging studies at the time.  Patient reports he has felt poorly during the last 2 to 3 months and during his recent visits to the [place] in [place]. He reports a total unintentional weight loss of close to 20 pounds. He also reports drenching night sweats over the last 4 to 6 weeks.  He reports that his cough and shortness of breath progressively got much more severe which led to him presenting to the emergency room yesterday. Notes nonproductive cough. No overt fevers.  Patient had a CTA of the chest on 03/07/2018 which showed  "Small acute pulmonary emboli identified within the distal left upper lobe lobar pulmonary artery. 2. 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 Primary differential considerations include lymphoma and leukemia as well as malignant derm cell tumors.  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 Narrowing and displacement of the trachea with complete occlusion of the upper lobe bronchi. There is also marked narrowing of bilateral upper lobe pulmonary arteries. 3. Significantly diminished aeration to both upper lobes, left greater than right. 4. Moderate left pleural effusion."  Patient is currently on room air and saturating 99% with stable hemodynamics. He has been started on IV heparin due to his small pulmonary embolism. 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  No headaches.  No facial swelling.  No upper extremity swelling.  No overt clinical symptomatology of SVC syndrome despite radiographic findings of some SVC narrowing.  No overt chest pain at this time.  Interval History:   Nicholas Moreno returns today for management and evaluation of his Newly diagnosed Primary mediastinal B cell Non hodgkins lymphoma. I last saw the pt on 05/02/18 with C3 discharge. He is accompanied today by his mother. The pt reports that he is doing well overall.   The pt reports that he has noticed a few drops of blood on his tissue paper recently. He endorses hard stools and some constipation as well. Otherwise the pt notes that he has not developed any new concerns in the interim. He endorses tolerating his last cycle of dose increased EPOCH-R very well and denies fatigue, fevers, chills, or night sweats. He has been eating well and endorses stable weight.  Lab results today (05/13/18) of CBC w/diff and CMP is as follows:  all values are WNL except for WBC at 19.9k, RBC at 3.83, HGB at 10.4, HCT at 32.5, RDW at 20.5, nRBC at 1.4, ANC at 14.5k, Monocytes abs at 1.7k, Basophils abs at 300, Abs immature  granulocytes at 2.20k, ALT at 78, Alk Phos at 147. 05/13/18 Magnesium at 2.0  On review of systems, pt reports blood on tissue paper, breathing well, eating well, stable weight, good energy levels, blood on tissue paper, constipation, hard stools, and denies fevers, chills, night sweats, difficulty breathing, fatigue, mouth sores, arm swelling, nose bleeds, gum bleeds, other concerns for bleeding, abdominal pains, leg swelling, lower abdominal pains, rashes, problems passing urine, and any other symptoms.    MEDICAL HISTORY:  No past medical history on file.  SURGICAL HISTORY: Past Surgical History:  Procedure Laterality Date  . IR IMAGING GUIDED PORT INSERTION  04/22/2018  . MEDIASTINOSCOPY N/A 03/10/2018   Procedure: MEDIASTINOSCOPY;  Surgeon: Ivin Poot, MD;  Location: [facility];  Service: Thoracic;  Laterality: N/A;  . VIDEO ASSISTED THORACOSCOPY (VATS)/ LYMPH NODE SAMPLING Left 03/10/2018   Procedure: VIDEO ASSISTED THORACOSCOPY (VATS)/ BIOSPY ANTERIOR APPROACH;  Surgeon: Ivin Poot, MD;  Location: [facility];  Service: Thoracic;  Laterality: Left;  Marland Kitchen VIDEO BRONCHOSCOPY Left 03/10/2018   Procedure: VIDEO BRONCHOSCOPY;  Surgeon: Ivin Poot, MD;  Location: [facility] OR;  Service: Thoracic;  Laterality: Left;    SOCIAL HISTORY: Social History   Socioeconomic History  . Marital status: Single    Spouse name: Not on file  . Number of children: Not on file  . Years of education: Not on file  . Highest education level: Not on file  Occupational History  . Not on file  Social Needs  . Financial resource strain: Not on file  . Food insecurity:    Worry: Not on file    Inability: Not on file  . Transportation needs:    Medical: Not on file    Non-medical: Not on file  Tobacco Use  . Smoking status: Never Smoker  . Smokeless tobacco: Never Used  Substance and Sexual Activity  . Alcohol use: Not Currently  . Drug use: Not Currently  . Sexual activity: Not on file  Lifestyle  .  Physical activity:    Days per week: Not on file    Minutes per session: Not on file  . Stress: Not on file  Relationships  . Social connections:    Talks on phone: Not on file    Gets together: Not on file    Attends religious service: Not on file    Active member of club or organization: Not on file    Attends meetings of clubs or organizations: Not on file    Relationship status: Not on file  . Intimate partner violence:    Fear of current or ex partner: Not on file    Emotionally abused: Not on file    Physically abused: Not on file    Forced sexual activity: Not on file  Other Topics Concern  . Not on file  Social History Narrative  . Not on file    FAMILY HISTORY: No family history on file.  ALLERGIES:  has No Known Allergies.  MEDICATIONS:  Current Outpatient Medications  Medication Sig Dispense Refill  . albuterol (PROVENTIL HFA;VENTOLIN HFA) 108 (90 Base) MCG/ACT inhaler Inhale 2 puffs into the lungs every 4 (four) hours as needed for wheezing or shortness of breath.    Marland Kitchen apixaban (ELIQUIS) 5 MG TABS tablet Take  1 tablet (5 mg total) by mouth 2 (two) times daily. 60 tablet 4  . fluticasone (FLOVENT HFA) 110 MCG/ACT inhaler Inhale 2 puffs into the lungs 2 (two) times daily as needed (asthma related symptoms).     . polyethylene glycol (MIRALAX / GLYCOLAX) packet Take 17 g by mouth daily. (Patient not taking: Reported on 04/28/2018) 14 each 0  . senna-docusate (SENOKOT-S) 8.6-50 MG tablet Take 2 tablets by mouth at bedtime as needed for mild constipation. (Patient not taking: Reported on 04/28/2018) 60 tablet 0  . traMADol (ULTRAM) 50 MG tablet Take 1 tablet (50 mg total) by mouth every 6 (six) hours as needed (mild pain). (Patient not taking: Reported on 04/28/2018) 30 tablet 0   No current facility-administered medications for this visit.     REVIEW OF SYSTEMS:    A 10+ POINT REVIEW OF SYSTEMS WAS OBTAINED including neurology, dermatology, psychiatry, cardiac,  respiratory, lymph, extremities, GI, GU, Musculoskeletal, constitutional, breasts, reproductive, HEENT.  All pertinent positives are noted in the HPI.  All others are negative.   PHYSICAL EXAMINATION: ECOG PERFORMANCE STATUS: 1 - Symptomatic but completely ambulatory  . Vitals:   05/13/18 0938  BP: 100/66  Pulse: 87  Resp: 18  Temp: 97.9 F (36.6 C)  SpO2: 100%   Filed Weights   05/13/18 0938  Weight: 129 lb 4.8 oz (58.7 kg)   .Body mass index is 20.87 kg/m.  GENERAL:alert, in no acute distress and comfortable SKIN: no acute rashes, no significant lesions EYES: conjunctiva are pink and non-injected, sclera anicteric OROPHARYNX: MMM, no exudates, no oropharyngeal erythema or ulceration NECK: supple, no JVD LYMPH:  no palpable lymphadenopathy in the cervical, axillary or inguinal regions LUNGS: clear to auscultation b/l with normal respiratory effort HEART: regular rate & rhythm ABDOMEN:  normoactive bowel sounds , non tender, not distended. No palpable hepatosplenomegaly.  Extremity: no pedal edema PSYCH: alert & oriented x 3 with fluent speech NEURO: no focal motor/sensory deficits   LABORATORY DATA:  I have reviewed the data as listed  . CBC Latest Ref Rng & Units 05/13/2018 05/02/2018 05/01/2018  WBC 4.0 - 10.5 K/uL 19.9(H) 7.3 9.3  Hemoglobin 13.0 - 17.0 g/dL 10.4(L) 9.9(L) 10.0(L)  Hematocrit 39.0 - 52.0 % 32.5(L) 31.1(L) 32.3(L)  Platelets 150 - 400 K/uL 249 396 390    . CMP Latest Ref Rng & Units 05/13/2018 05/02/2018 05/01/2018  Glucose 70 - 99 mg/dL 88 106(H) 103(H)  BUN 6 - 20 mg/dL _0 Creatinine 0.61 - 1.24 mg/dL 0.86 0.59(L) 0.60(L)  Sodium 135 - 145 mmol/L 141 140 140  Potassium 3.5 - 5.1 mmol/L 4.2 3.4(L) 3.5  Chloride 98 - 111 mmol/L 105 104 106  CO2 22 - 32 mmol/L _1 Calcium 8.9 - 10.3 mg/dL 9.5 9.2 9.2  Total Protein 6.5 - 8.1 g/dL 6.6 5.9(L) 6.2(L)  Total Bilirubin 0.3 - 1.2 mg/dL 0.4 0.6 0.4  Alkaline Phos 38 - 126 U/L 147(H) 62 65    AST 15 - 41 U/L 26 15 14(L)  ALT 0 - 44 U/L 78(H) 26 26   03/10/18 Biopsy:     03/10/18 Tissue Flow Cytometry:     RADIOGRAPHIC STUDIES: I have personally reviewed the radiological images as listed and agreed with the findings in the report. Ir Imaging Guided Port Insertion  Result Date: 04/22/2018 CLINICAL DATA:  History of B-cell non-Hodgkin's mediastinal lymphoma. Currently receiving chemotherapy and in need for porta cath for continued chemotherapy and IV access needs. EXAM:  IMPLANTED PORT A CATH PLACEMENT WITH ULTRASOUND AND FLUOROSCOPIC GUIDANCE ANESTHESIA/SEDATION: 4.0 mg IV Versed; 150 mcg IV Fentanyl Total Moderate Sedation Time:  42 minutes. The patient's level of consciousness and physiologic status were continuously monitored during the procedure by Radiology nursing. Additional Medications: 2 g IV Ancef. FLUOROSCOPY TIME:  36 seconds.  2.3 mGy. PROCEDURE: The procedure, risks, benefits, and alternatives were explained to the patient. Questions regarding the procedure were encouraged and answered. The patient understands and consents to the procedure. A time-out was performed prior to initiating the procedure. Ultrasound was utilized to confirm patency of the right internal jugular vein. The right neck and chest were prepped with chlorhexidine in a sterile fashion, and a sterile drape was applied covering the operative field. Maximum barrier sterile technique with sterile gowns and gloves were used for the procedure. Local anesthesia was provided with 1% lidocaine. After creating a small venotomy incision, a 21 gauge needle was advanced into the right internal jugular vein under direct, real-time ultrasound guidance. Ultrasound image documentation was performed. After securing guidewire access, an 8 Fr dilator was placed. A J-wire was kinked to measure appropriate catheter length. A subcutaneous port pocket was then created along the upper chest wall utilizing sharp and blunt dissection.  Portable cautery was utilized. The pocket was irrigated with sterile saline. A single lumen power injectable port was chosen for placement. The 8 Fr catheter was tunneled from the port pocket site to the venotomy incision. The port was placed in the pocket. External catheter was trimmed to appropriate length based on guidewire measurement. At the venotomy, an 8 Fr peel-away sheath was placed over a guidewire. The catheter was then placed through the sheath and the sheath removed. Final catheter positioning was confirmed and documented with a fluoroscopic spot image. The port was accessed with a needle and aspirated and flushed with heparinized saline. The access needle was removed. The venotomy and port pocket incisions were closed with subcutaneous 3-0 Monocryl and subcuticular 4-0 Vicryl. Dermabond was applied to both incisions. COMPLICATIONS: COMPLICATIONS None FINDINGS: After catheter placement, the tip lies at the cavo-atrial junction. The catheter aspirates normally and is ready for immediate use. IMPRESSION: Placement of single lumen port a cath via right internal jugular vein. The catheter tip lies at the cavo-atrial junction. A power injectable port a cath was placed and is ready for immediate use. Electronically Signed   By: Aletta Edouard M.D.   On: 04/22/2018 16:23    ASSESSMENT & PLAN:  23 y.o. male with  #1Recently diagnosed Primary Mediastinal B cell Non Hodgkins lymphoma c-MYC negative Not a double hit lymphoma Pathology confirmed by pathology read at [facility].  #2 s/p SVC compression due to mediastinal mass without overt SVC syndrome clinical symptoms #3 small acute distal left upper lobe pulmonary embolus-on lovenox #4 Acute Left IJ and Subclavian DVT due to left sided venous compression due to mass and due to malignancy -was on lovenox for malignancy related thrombosis -- now changed to Eliquis #4 left-sided pleural effusion and left-sided lung atelectasis due to  airway compression from mediastinal mass -resolved on CXR  #5 significant weight loss of 20 pounds likely due to lymphoma- resolving with treatment.  Wt Readings from Last 3 Encounters:  05/13/18 129 lb 4.8 oz (58.7 kg)  04/28/18 122 lb 9.2 oz (55.6 kg)  04/21/18 124 lb 11.2 oz (56.6 kg)    #6 drenching night sweats likely has constitutional symptoms from his PMBCL - resolved  #7 Hypokalemia- K 3.4 ---  likely due to high dose steroids. Recommended increased po potassium intake.  PLAN: -Discussed pt labwork today, 05/13/18; ANC at 14.5k in setting of G-CSF support, HGB slightly improved to 10.4, PLT at 249k, mildly increased ALT expected to normalize. Magnesium normal.  -Pt has clinically tolerated dose escalated EPOCH-R well, and his labs have held. -Intending C4 dose adjusted EPOCH-R treatment on 05/19/18 -Outpatient Rituxan and Neulasta on 05/26/18 -Planning for PET/CT after completing C4 -Continue Eliquis 38m po BID  -Suspect anal fissure, recommend using Senna S to allow healing -Salt and baking soda mouthwashes 4-5 times per day -Will see pt back in clinic on 06/02/18 for toxicity and nadir check    [facility] Rituxan and Udenycha on 2/17  RTC with Dr KIrene [facility] on 06/02/2018 PET/CT on 05/29/18   All of the patients questions were answered with apparent satisfaction. The patient knows to call the clinic with any problems, questions or concerns.  The total time spent in the appt was 25 minutes and more than 50% was on counseling and direct patient cares.    GSullivan LoneMD MS AAHIVMS SEdward White HospitalCPinckneyville Community HospitalHematology/Oncology Physician CRose[facility] (Office):       [phone](Work cell):  [phone](Fax):           [phone] 05/13/2018 10:34 AM  I, SBaldwin Jamaica am acting as a scribe for Dr. GSullivan Lone   .I have reviewed the above documentation for accuracy and completeness, and I agree with the  above. .Brunetta GeneraMD

## 2018-05-13 ENCOUNTER — Inpatient Hospital Stay (HOSPITAL_BASED_OUTPATIENT_CLINIC_OR_DEPARTMENT_OTHER): Payer: BLUE CROSS/BLUE SHIELD | Admitting: Hematology

## 2018-05-13 ENCOUNTER — Inpatient Hospital Stay: Payer: BLUE CROSS/BLUE SHIELD

## 2018-05-13 ENCOUNTER — Inpatient Hospital Stay: Payer: BLUE CROSS/BLUE SHIELD | Attending: Hematology

## 2018-05-13 VITALS — BP 100/66 | HR 87 | Temp 97.9°F | Resp 18 | Ht 66.0 in | Wt 129.3 lb

## 2018-05-13 DIAGNOSIS — I82402 Acute embolism and thrombosis of unspecified deep veins of left lower extremity: Secondary | ICD-10-CM | POA: Diagnosis not present

## 2018-05-13 DIAGNOSIS — C8528 Mediastinal (thymic) large B-cell lymphoma, lymph nodes of multiple sites: Secondary | ICD-10-CM | POA: Insufficient documentation

## 2018-05-13 DIAGNOSIS — I2699 Other pulmonary embolism without acute cor pulmonale: Secondary | ICD-10-CM | POA: Diagnosis not present

## 2018-05-13 DIAGNOSIS — Z7901 Long term (current) use of anticoagulants: Secondary | ICD-10-CM | POA: Insufficient documentation

## 2018-05-13 DIAGNOSIS — Z5189 Encounter for other specified aftercare: Secondary | ICD-10-CM | POA: Diagnosis not present

## 2018-05-13 DIAGNOSIS — Z95828 Presence of other vascular implants and grafts: Secondary | ICD-10-CM

## 2018-05-13 DIAGNOSIS — Z86718 Personal history of other venous thrombosis and embolism: Secondary | ICD-10-CM | POA: Diagnosis not present

## 2018-05-13 DIAGNOSIS — K5900 Constipation, unspecified: Secondary | ICD-10-CM | POA: Diagnosis not present

## 2018-05-13 DIAGNOSIS — E876 Hypokalemia: Secondary | ICD-10-CM

## 2018-05-13 DIAGNOSIS — Z5112 Encounter for antineoplastic immunotherapy: Secondary | ICD-10-CM | POA: Insufficient documentation

## 2018-05-13 LAB — CBC WITH DIFFERENTIAL/PLATELET
Abs Immature Granulocytes: 2.2 10*3/uL — ABNORMAL HIGH (ref 0.00–0.07)
BASOS ABS: 0.3 10*3/uL — AB (ref 0.0–0.1)
Basophils Relative: 1 %
Eosinophils Absolute: 0.2 10*3/uL (ref 0.0–0.5)
Eosinophils Relative: 1 %
HCT: 32.5 % — ABNORMAL LOW (ref 39.0–52.0)
Hemoglobin: 10.4 g/dL — ABNORMAL LOW (ref 13.0–17.0)
Immature Granulocytes: 11 %
Lymphocytes Relative: 5 %
Lymphs Abs: 1 10*3/uL (ref 0.7–4.0)
MCH: 27.2 pg (ref 26.0–34.0)
MCHC: 32 g/dL (ref 30.0–36.0)
MCV: 84.9 fL (ref 80.0–100.0)
Monocytes Absolute: 1.7 10*3/uL — ABNORMAL HIGH (ref 0.1–1.0)
Monocytes Relative: 9 %
Neutro Abs: 14.5 10*3/uL — ABNORMAL HIGH (ref 1.7–7.7)
Neutrophils Relative %: 73 %
Platelets: 249 10*3/uL (ref 150–400)
RBC: 3.83 MIL/uL — AB (ref 4.22–5.81)
RDW: 20.5 % — ABNORMAL HIGH (ref 11.5–15.5)
WBC: 19.9 10*3/uL — ABNORMAL HIGH (ref 4.0–10.5)
nRBC: 1.4 % — ABNORMAL HIGH (ref 0.0–0.2)

## 2018-05-13 LAB — CMP (CANCER CENTER ONLY)
ALT: 78 U/L — ABNORMAL HIGH (ref 0–44)
AST: 26 U/L (ref 15–41)
Albumin: 3.7 g/dL (ref 3.5–5.0)
Alkaline Phosphatase: 147 U/L — ABNORMAL HIGH (ref 38–126)
Anion gap: 10 (ref 5–15)
BUN: 10 mg/dL (ref 6–20)
CHLORIDE: 105 mmol/L (ref 98–111)
CO2: 26 mmol/L (ref 22–32)
Calcium: 9.5 mg/dL (ref 8.9–10.3)
Creatinine: 0.86 mg/dL (ref 0.61–1.24)
GFR, Est AFR Am: >60 mL/min (ref >60)
GFR, Estimated: >60 mL/min (ref >60)
Glucose, Bld: 88 mg/dL (ref 70–99)
Potassium: 4.2 mmol/L (ref 3.5–5.1)
Sodium: 141 mmol/L (ref 135–145)
Total Bilirubin: 0.4 mg/dL (ref 0.3–1.2)
Total Protein: 6.6 g/dL (ref 6.5–8.1)

## 2018-05-13 LAB — MAGNESIUM: Magnesium: 2 mg/dL (ref 1.7–2.4)

## 2018-05-13 MED ORDER — SODIUM CHLORIDE 0.9% FLUSH
10.0000 mL | INTRAVENOUS | Status: DC | PRN
  Administered 2018-05-13: 10 mL via INTRAVENOUS
  Filled 2018-05-13: qty 10

## 2018-05-13 MED ORDER — HEPARIN SOD (PORK) LOCK FLUSH 100 UNIT/ML IV SOLN
500.0000 [IU] | Freq: Once | INTRAVENOUS | Status: AC
  Administered 2018-05-13: 500 [IU] via INTRAVENOUS
  Filled 2018-05-13: qty 5

## 2018-05-13 NOTE — Patient Instructions (Signed)
Thank you for choosing [facility] to provide your oncology and hematology care.  To afford each patient quality time with our providers, please arrive 30 minutes before your scheduled appointment time.  If you arrive late for your appointment, you may be asked to reschedule.  We strive to give you quality time with our providers, and arriving late affects you and other patients whose appointments are after yours.    If you are a no show for multiple scheduled visits, you may be dismissed from the clinic at the providers discretion.     Again, thank you for choosing [facility], our hope is that these requests will decrease the amount of time that you wait before being seen by our physicians.  ______________________________________________________________________   Should you have questions after your visit to the [facility], please contact our office at [phone] between the hours of 8:30 and 4:30 p.m.    Voicemails left after 4:30p.m will not be returned until the following business day.     For prescription refill requests, please have your pharmacy contact us directly.  Please also try to allow 48 hours for prescription requests.     Please contact the scheduling department for questions regarding scheduling.  For scheduling of procedures such as PET scans, CT scans, MRI, Ultrasound, etc please contact central scheduling at [phone].     Resources For Cancer Patients and Caregivers:    Oncolink.org:  A wonderful resource for patients and healthcare providers for information regarding your disease, ways to tract your treatment, what to expect, etc.      American Cancer Society:  800-227-2345  Can help patients locate various types of support and financial assistance   Cancer Care: 1-800-813-HOPE (4673) Provides financial assistance, online support groups, medication/co-pay assistance.     [place] DSS:  336-641-3447 Where to apply  for food stamps, Medicaid, and utility assistance   Medicare Rights Center: 800-333-4114 Helps people with Medicare understand their rights and benefits, navigate the Medicare system, and secure the quality healthcare they deserve   SCAT: 336-333-6589 [place] [employer]'s shared-ride transportation service for eligible riders who have a disability that prevents them from riding the fixed route bus.     For additional information on assistance programs please contact our social worker:   Abigail Elmore:  [phone]  

## 2018-05-14 ENCOUNTER — Telehealth: Payer: Self-pay | Admitting: *Deleted

## 2018-05-14 NOTE — Telephone Encounter (Signed)
Contacted Dawn in Patient Placement for admission to [facility] for 5 day EPOCH on 05/19/2018.  Sent email to #[email or web address] with patient information.

## 2018-05-19 ENCOUNTER — Other Ambulatory Visit: Payer: Self-pay

## 2018-05-19 ENCOUNTER — Encounter (HOSPITAL_COMMUNITY): Payer: Self-pay

## 2018-05-19 ENCOUNTER — Inpatient Hospital Stay (HOSPITAL_COMMUNITY)
Admission: AD | Admit: 2018-05-19 | Discharge: 2018-05-23 | DRG: 847 | Disposition: A | Discharge status: Home / Self Care | Payer: BLUE CROSS/BLUE SHIELD | Attending: Hematology | Admitting: Hematology

## 2018-05-19 DIAGNOSIS — K602 Anal fissure, unspecified: Secondary | ICD-10-CM | POA: Diagnosis present

## 2018-05-19 DIAGNOSIS — I82B12 Acute embolism and thrombosis of left subclavian vein: Secondary | ICD-10-CM | POA: Diagnosis present

## 2018-05-19 DIAGNOSIS — I82C12 Acute embolism and thrombosis of left internal jugular vein: Secondary | ICD-10-CM | POA: Diagnosis present

## 2018-05-19 DIAGNOSIS — E876 Hypokalemia: Secondary | ICD-10-CM

## 2018-05-19 DIAGNOSIS — K1230 Oral mucositis (ulcerative), unspecified: Secondary | ICD-10-CM

## 2018-05-19 DIAGNOSIS — Z7189 Other specified counseling: Secondary | ICD-10-CM

## 2018-05-19 DIAGNOSIS — Z5111 Encounter for antineoplastic chemotherapy: Secondary | ICD-10-CM | POA: Diagnosis not present

## 2018-05-19 DIAGNOSIS — C8528 Mediastinal (thymic) large B-cell lymphoma, lymph nodes of multiple sites: Secondary | ICD-10-CM | POA: Diagnosis not present

## 2018-05-19 DIAGNOSIS — C8522 Mediastinal (thymic) large B-cell lymphoma, intrathoracic lymph nodes: Secondary | ICD-10-CM | POA: Diagnosis present

## 2018-05-19 DIAGNOSIS — C8510 Unspecified B-cell lymphoma, unspecified site: Secondary | ICD-10-CM | POA: Diagnosis present

## 2018-05-19 LAB — PHOSPHORUS: Phosphorus: 3.9 mg/dL (ref 2.5–4.6)

## 2018-05-19 LAB — CBC WITH DIFFERENTIAL/PLATELET
Abs Immature Granulocytes: 0.11 10*3/uL — ABNORMAL HIGH (ref 0.00–0.07)
BASOS ABS: 0.1 10*3/uL (ref 0.0–0.1)
Basophils Relative: 1 %
Eosinophils Absolute: 0.1 10*3/uL (ref 0.0–0.5)
Eosinophils Relative: 1 %
HEMATOCRIT: 33.5 % — AB (ref 39.0–52.0)
Hemoglobin: 10.4 g/dL — ABNORMAL LOW (ref 13.0–17.0)
Immature Granulocytes: 1 %
Lymphocytes Relative: 8 %
Lymphs Abs: 0.8 10*3/uL (ref 0.7–4.0)
MCH: 27.4 pg (ref 26.0–34.0)
MCHC: 31 g/dL (ref 30.0–36.0)
MCV: 88.4 fL (ref 80.0–100.0)
Monocytes Absolute: 0.9 10*3/uL (ref 0.1–1.0)
Monocytes Relative: 9 %
Neutro Abs: 8.9 10*3/uL — ABNORMAL HIGH (ref 1.7–7.7)
Neutrophils Relative %: 80 %
Platelets: 366 10*3/uL (ref 150–400)
RBC: 3.79 MIL/uL — AB (ref 4.22–5.81)
RDW: 20.4 % — ABNORMAL HIGH (ref 11.5–15.5)
WBC: 10.9 10*3/uL — ABNORMAL HIGH (ref 4.0–10.5)
nRBC: 0.5 % — ABNORMAL HIGH (ref 0.0–0.2)

## 2018-05-19 LAB — COMPREHENSIVE METABOLIC PANEL
ALT: 34 U/L (ref 0–44)
ANION GAP: 7 (ref 5–15)
AST: 24 U/L (ref 15–41)
Albumin: 4.2 g/dL (ref 3.5–5.0)
Alkaline Phosphatase: 81 U/L (ref 38–126)
BILIRUBIN TOTAL: 0.5 mg/dL (ref 0.3–1.2)
BUN: 14 mg/dL (ref 6–20)
CO2: 27 mmol/L (ref 22–32)
Calcium: 9 mg/dL (ref 8.9–10.3)
Chloride: 105 mmol/L (ref 98–111)
Creatinine, Ser: 0.67 mg/dL (ref 0.61–1.24)
GFR calc Af Amer: >60 mL/min (ref >60)
GFR calc non Af Amer: >60 mL/min (ref >60)
Glucose, Bld: 103 mg/dL — ABNORMAL HIGH (ref 70–99)
Potassium: 3.9 mmol/L (ref 3.5–5.1)
Sodium: 139 mmol/L (ref 135–145)
Total Protein: 6.6 g/dL (ref 6.5–8.1)

## 2018-05-19 LAB — MAGNESIUM: Magnesium: 2.3 mg/dL (ref 1.7–2.4)

## 2018-05-19 MED ORDER — PREDNISONE 20 MG PO TABS: 60.0000 mg | ORAL_TABLET | Freq: Every day | ORAL | Status: DC

## 2018-05-19 MED ORDER — SODIUM CHLORIDE 0.9 % IV SOLN
INTRAVENOUS | Status: DC
  Administered 2018-05-19 – 2018-05-22 (×2): via INTRAVENOUS

## 2018-05-19 MED ORDER — PREDNISONE 20 MG PO TABS
60.0000 mg | ORAL_TABLET | Freq: Every day | ORAL | Status: AC
  Administered 2018-05-19 – 2018-05-23 (×5): 60 mg via ORAL
  Filled 2018-05-19 (×5): qty 3

## 2018-05-19 MED ORDER — ACETAMINOPHEN 325 MG PO TABS: 650.0000 mg | ORAL_TABLET | ORAL | Status: DC | PRN

## 2018-05-19 MED ORDER — SENNOSIDES-DOCUSATE SODIUM 8.6-50 MG PO TABS: 2.0000 | ORAL_TABLET | Freq: Every evening | ORAL | Status: DC | PRN

## 2018-05-19 MED ORDER — ALBUTEROL SULFATE (2.5 MG/3ML) 0.083% IN NEBU: 3.0000 mL | INHALATION_SOLUTION | RESPIRATORY_TRACT | Status: DC | PRN

## 2018-05-19 MED ORDER — VINCRISTINE SULFATE CHEMO INJECTION 1 MG/ML
Freq: Once | INTRAVENOUS | Status: AC
  Administered 2018-05-19: 16:00:00 via INTRAVENOUS
  Filled 2018-05-19: qty 9

## 2018-05-19 MED ORDER — SODIUM CHLORIDE 0.9 % IV SOLN
Freq: Once | INTRAVENOUS | Status: AC
  Administered 2018-05-19: 8 mg via INTRAVENOUS
  Filled 2018-05-19: qty 4

## 2018-05-19 MED ORDER — APIXABAN 5 MG PO TABS
5.0000 mg | ORAL_TABLET | Freq: Two times a day (BID) | ORAL | Status: DC
  Administered 2018-05-19 – 2018-05-23 (×8): 5 mg via ORAL
  Filled 2018-05-19 (×8): qty 1

## 2018-05-19 MED ORDER — VINCRISTINE SULFATE CHEMO INJECTION 1 MG/ML
Freq: Once | INTRAVENOUS | Status: DC
  Filled 2018-05-19: qty 9

## 2018-05-19 MED ORDER — POLYETHYLENE GLYCOL 3350 17 G PO PACK: 17.0000 g | PACK | Freq: Every day | ORAL | Status: DC

## 2018-05-19 NOTE — Progress Notes (Signed)
Dose of chemo confirmed w/ Dr. Irene Limbo. We will keep pt at same dose level 2; w/ same doses as previous cycle.  Pt tolerated well per MD. Kennith Center, Pharm.D., CPP 05/19/2018@1 :22 PM

## 2018-05-19 NOTE — H&P (Signed)
HEMATOLOGY/ONCOLOGY CONSULTATION NOTE  Date of Service: 05/19/2018  Patient Care Team: Patient, No Pcp Per as PCP - General (General Practice)  CHIEF COMPLAINTS/PURPOSE OF CONSULTATION:  C4 da EPOCH-R Treatment of Primary Mediastinal B Cell Non-Hodgkin's Lymphoma  HISTORY OF PRESENTING ILLNESS:   Nicholas Moreno is a wonderful 23 y.o. male who has been admitted today for [place] of his Primary Mediastinal B Cell Non Hodgkin's Lymphoma. He reports that he is doing well overall.   The pt reports that he has not developed any concerns since our last visit in clinic. He endorses good energy levels, has been eating well, and has rested well. He denies any mouth sores or concerns for infections. He notes that he has gained weight in the interim as well.  Lab results today (05/19/18) of CBC w/diff and CMP is as follows: all values are WNL except for WBC at 10.9k, RBC at 3.79, HGB at 10.4, HCT at 33.5, RDW at 20.4, nRBC at 0.5%, ANC at 8.9k, Abs immature granulocytes at 0.11k, Glucose at 103.  On review of systems, pt reports good energy levels, eating well, weight gain, resting well, and denies mouth sores, abdominal pains, concerns for infections, and any other symptoms.   MEDICAL HISTORY:  History reviewed. No pertinent past medical history.  SURGICAL HISTORY: Past Surgical History:  Procedure Laterality Date  . IR IMAGING GUIDED PORT INSERTION  04/22/2018  . MEDIASTINOSCOPY N/A 03/10/2018   Procedure: MEDIASTINOSCOPY;  Surgeon: Ivin Poot, MD;  Location: [facility];  Service: Thoracic;  Laterality: N/A;  . VIDEO ASSISTED THORACOSCOPY (VATS)/ LYMPH NODE SAMPLING Left 03/10/2018   Procedure: VIDEO ASSISTED THORACOSCOPY (VATS)/ BIOSPY ANTERIOR APPROACH;  Surgeon: Ivin Poot, MD;  Location: [place];  Service: Thoracic;  Laterality: Left;  Marland Kitchen VIDEO BRONCHOSCOPY Left 03/10/2018   Procedure: VIDEO BRONCHOSCOPY;  Surgeon: Ivin Poot, MD;  Location: [facility] OR;  Service:  Thoracic;  Laterality: Left;    SOCIAL HISTORY: Social History   Socioeconomic History  . Marital status: Single    Spouse name: Not on file  . Number of children: Not on file  . Years of education: Not on file  . Highest education level: Not on file  Occupational History  . Not on file  Social Needs  . Financial resource strain: Not on file  . Food insecurity:    Worry: Not on file    Inability: Not on file  . Transportation needs:    Medical: Not on file    Non-medical: Not on file  Tobacco Use  . Smoking status: Never Smoker  . Smokeless tobacco: Never Used  Substance and Sexual Activity  . Alcohol use: Not Currently  . Drug use: Not Currently  . Sexual activity: Not on file  Lifestyle  . Physical activity:    Days per week: Not on file    Minutes per session: Not on file  . Stress: Not on file  Relationships  . Social connections:    Talks on phone: Not on file    Gets together: Not on file    Attends religious service: Not on file    Active member of club or organization: Not on file    Attends meetings of clubs or organizations: Not on file    Relationship status: Not on file  . Intimate partner violence:    Fear of current or ex partner: Not on file    Emotionally abused: Not on file    Physically abused: Not  on file    Forced sexual activity: Not on file  Other Topics Concern  . Not on file  Social History Narrative  . Not on file    FAMILY HISTORY: History reviewed. No pertinent family history.  ALLERGIES:  has No Known Allergies.  MEDICATIONS:  Current Facility-Administered Medications  Medication Dose Route Frequency Provider Last Rate Last Dose  . 0.9 %  sodium chloride infusion   Intravenous Continuous Brunetta Genera, MD      . acetaminophen (TYLENOL) tablet 650 mg  650 mg Oral Q4H PRN Brunetta Genera, MD      . albuterol (PROVENTIL) (2.5 MG/3ML) 0.083% nebulizer solution 3 mL  3 mL Inhalation Q4H PRN Brunetta Genera, MD        . apixaban Arne Cleveland) tablet 5 mg  5 mg Oral BID Brunetta Genera, MD      . DOXOrubicin (ADRIAMYCIN) 18 mg, etoposide (VEPESID) 94 mg, vinCRIStine (ONCOVIN) 0.6 mg in sodium chloride 0.9 % 1,000 mL chemo infusion   Intravenous Once Brunetta Genera, MD      . ondansetron (ZOFRAN) 8 mg, dexamethasone (DECADRON) 10 mg in sodium chloride 0.9 % 50 mL IVPB   Intravenous Once Brunetta Genera, MD      . predniSONE (DELTASONE) tablet 60 mg  60 mg Oral QAC breakfast Brunetta Genera, MD      . senna-docusate (Senokot-S) tablet 2 tablet  2 tablet Oral QHS PRN Brunetta Genera, MD        REVIEW OF SYSTEMS:    A 10+ POINT REVIEW OF SYSTEMS WAS OBTAINED including neurology, dermatology, psychiatry, cardiac, respiratory, lymph, extremities, GI, GU, Musculoskeletal, constitutional, breasts, reproductive, HEENT.  All pertinent positives are noted in the HPI.  All others are negative.   PHYSICAL EXAMINATION: ECOG PERFORMANCE STATUS: 1 - Symptomatic but completely ambulatory  . Vitals:   05/19/18 1014 05/19/18 1401  BP: (!) 107/55 (!) 104/59  Pulse: (!) 103 89  Resp: 18 16  Temp: 98.2 F (36.8 C) 98.2 F (36.8 C)  SpO2: 98% 100%   Filed Weights   05/19/18 1014  Weight: 129 lb 12.8 oz (58.9 kg)   .Body mass index is 20.95 kg/m.   GENERAL:alert, in no acute distress and comfortable SKIN: no acute rashes, no significant lesions EYES: conjunctiva are pink and non-injected, sclera anicteric OROPHARYNX: MMM, no exudates, no oropharyngeal erythema or ulceration NECK: supple, no JVD LYMPH:  no palpable lymphadenopathy in the cervical, axillary or inguinal regions LUNGS: clear to auscultation b/l with normal respiratory effort HEART: regular rate & rhythm ABDOMEN:  normoactive bowel sounds , non tender, not distended. No palpable hepatosplenomegaly.  Extremity: no pedal edema PSYCH: alert & oriented x 3 with fluent speech NEURO: no focal motor/sensory deficits   LABORATORY  DATA:  I have reviewed the data as listed  . CBC Latest Ref Rng & Units 05/19/2018 05/13/2018 05/02/2018  WBC 4.0 - 10.5 K/uL 10.9(H) 19.9(H) 7.3  Hemoglobin 13.0 - 17.0 g/dL 10.4(L) 10.4(L) 9.9(L)  Hematocrit 39.0 - 52.0 % 33.5(L) 32.5(L) 31.1(L)  Platelets 150 - 400 K/uL 366 249 396    . CMP Latest Ref Rng & Units 05/19/2018 05/13/2018 05/02/2018  Glucose 70 - 99 mg/dL 103(H) 88 106(H)  BUN 6 - 20 mg/dL 14 10 15   Creatinine 0.61 - 1.24 mg/dL 0.67 0.86 0.59(L)  Sodium 135 - 145 mmol/L 139 141 140  Potassium 3.5 - 5.1 mmol/L 3.9 4.2 3.4(L)  Chloride 98 - 111 mmol/L 105 105  104  CO2 22 - 32 mmol/L 27 26 26   Calcium 8.9 - 10.3 mg/dL 9.0 9.5 9.2  Total Protein 6.5 - 8.1 g/dL 6.6 6.6 5.9(L)  Total Bilirubin 0.3 - 1.2 mg/dL 0.5 0.4 0.6  Alkaline Phos 38 - 126 U/L 81 147(H) 62  AST 15 - 41 U/L 24 26 15   ALT 0 - 44 U/L 34 78(H) 26     RADIOGRAPHIC STUDIES: I have personally reviewed the radiological images as listed and agreed with the findings in the report. Ir Imaging Guided Port Insertion  Result Date: 04/22/2018 CLINICAL DATA:  History of B-cell non-Hodgkin's mediastinal lymphoma. Currently receiving chemotherapy and in need for porta cath for continued chemotherapy and IV access needs. EXAM: IMPLANTED PORT A CATH PLACEMENT WITH ULTRASOUND AND FLUOROSCOPIC GUIDANCE ANESTHESIA/SEDATION: 4.0 mg IV Versed; 150 mcg IV Fentanyl Total Moderate Sedation Time:  42 minutes. The patient's level of consciousness and physiologic status were continuously monitored during the procedure by Radiology nursing. Additional Medications: 2 g IV Ancef. FLUOROSCOPY TIME:  36 seconds.  2.3 mGy. PROCEDURE: The procedure, risks, benefits, and alternatives were explained to the patient. Questions regarding the procedure were encouraged and answered. The patient understands and consents to the procedure. A time-out was performed prior to initiating the procedure. Ultrasound was utilized to confirm patency of the right  internal jugular vein. The right neck and chest were prepped with chlorhexidine in a sterile fashion, and a sterile drape was applied covering the operative field. Maximum barrier sterile technique with sterile gowns and gloves were used for the procedure. Local anesthesia was provided with 1% lidocaine. After creating a small venotomy incision, a 21 gauge needle was advanced into the right internal jugular vein under direct, real-time ultrasound guidance. Ultrasound image documentation was performed. After securing guidewire access, an 8 Fr dilator was placed. A J-wire was kinked to measure appropriate catheter length. A subcutaneous port pocket was then created along the upper chest wall utilizing sharp and blunt dissection. Portable cautery was utilized. The pocket was irrigated with sterile saline. A single lumen power injectable port was chosen for placement. The 8 Fr catheter was tunneled from the port pocket site to the venotomy incision. The port was placed in the pocket. External catheter was trimmed to appropriate length based on guidewire measurement. At the venotomy, an 8 Fr peel-away sheath was placed over a guidewire. The catheter was then placed through the sheath and the sheath removed. Final catheter positioning was confirmed and documented with a fluoroscopic spot image. The port was accessed with a needle and aspirated and flushed with heparinized saline. The access needle was removed. The venotomy and port pocket incisions were closed with subcutaneous 3-0 Monocryl and subcuticular 4-0 Vicryl. Dermabond was applied to both incisions. COMPLICATIONS: COMPLICATIONS None FINDINGS: After catheter placement, the tip lies at the cavo-atrial junction. The catheter aspirates normally and is ready for immediate use. IMPRESSION: Placement of single lumen port a cath via right internal jugular vein. The catheter tip lies at the cavo-atrial junction. A power injectable port a cath was placed and is ready for  immediate use. Electronically Signed   By: Aletta Edouard M.D.   On: 04/22/2018 16:23    ASSESSMENT & PLAN:  23 y.o. 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  #2 s/p SVC compression due to mediastinal mass without overt SVC syndrome clinical symptoms #3 small  acute distal left upper lobe pulmonary embolus-on lovenox #4 Acute Left IJ and Subclavian DVT due to left sided venous compression due to mass and due to malignancy -wason lovenox for malignancy related thrombosis-- now changed to Eliquis #4 left-sided pleural effusion and left-sided lung atelectasis due to airway compression from mediastinal mass -resolved on CXR  #5 significant weight loss of 20 pounds likely due to lymphoma- resolving with treatment.  Wt Readings from Last 3 Encounters:  05/19/18 129 lb 12.8 oz (58.9 kg)  05/13/18 129 lb 4.8 oz (58.7 kg)  04/28/18 122 lb 9.2 oz (55.6 kg)   #6 h/o drenching night sweats likely has constitutional symptoms from his PMBCL - resolved    PLAN: -Discussed pt labwork today, 05/19/18; blood counts and chemistries are stable -The pt has no prohibitive toxicities from continuing [place] at this time.  Will continue with dose escalation L2 as with C3 since he tolerated this well. -Chemotherapy orders placed, reviewed and discussed with pharmacist. -Continue eating well, staying hydrated, and staying as active as reasonably possible  -Outpatient Rituxan and Neulasta on 05/26/18 -Planning for PET/CT on 05/29/18 -Continue Eliquis 5mg  po BID  -Suspect anal fissure, recommend using Senna S to allow healing -Salt and baking soda mouthwashes 4-5 times per day to minimize mucositis. We will see daily as inpatient.  Plan for discharge on Friday 2/14 -Will see pt back in clinic on 06/02/18 for toxicity and nadir check    All of the  patients questions were answered with apparent satisfaction. The patient knows to call the clinic with any problems, questions or concerns.   Sullivan Lone MD MS AAHIVMS [facility] Hematology/Oncology Physician [facility]  (Office):       [phone] (Work cell):  [phone] (Fax):           [phone]  05/19/2018 2:34 PM  I, Baldwin Jamaica, am acting as a scribe for Dr. Sullivan Lone.   .I have reviewed the above documentation for accuracy and completeness, and I agree with the above. Sullivan Lone MD MS

## 2018-05-20 LAB — CBC
HCT: 32.8 % — ABNORMAL LOW (ref 39.0–52.0)
Hemoglobin: 10.1 g/dL — ABNORMAL LOW (ref 13.0–17.0)
MCH: 27.2 pg (ref 26.0–34.0)
MCHC: 30.8 g/dL (ref 30.0–36.0)
MCV: 88.4 fL (ref 80.0–100.0)
NRBC: 0.1 % (ref 0.0–0.2)
Platelets: 423 10*3/uL — ABNORMAL HIGH (ref 150–400)
RBC: 3.71 MIL/uL — ABNORMAL LOW (ref 4.22–5.81)
RDW: 19.9 % — ABNORMAL HIGH (ref 11.5–15.5)
WBC: 24.1 10*3/uL — ABNORMAL HIGH (ref 4.0–10.5)

## 2018-05-20 LAB — BASIC METABOLIC PANEL
Anion gap: 9 (ref 5–15)
BUN: 11 mg/dL (ref 6–20)
CHLORIDE: 104 mmol/L (ref 98–111)
CO2: 24 mmol/L (ref 22–32)
Calcium: 9 mg/dL (ref 8.9–10.3)
Creatinine, Ser: 0.61 mg/dL (ref 0.61–1.24)
GFR calc Af Amer: >60 mL/min (ref >60)
GFR calc non Af Amer: >60 mL/min (ref >60)
Glucose, Bld: 111 mg/dL — ABNORMAL HIGH (ref 70–99)
POTASSIUM: 3.7 mmol/L (ref 3.5–5.1)
Sodium: 137 mmol/L (ref 135–145)

## 2018-05-20 MED ORDER — VINCRISTINE SULFATE CHEMO INJECTION 1 MG/ML
Freq: Once | INTRAVENOUS | Status: AC
  Administered 2018-05-20: 14:00:00 via INTRAVENOUS
  Filled 2018-05-20: qty 9

## 2018-05-20 MED ORDER — SODIUM BICARBONATE/SODIUM CHLORIDE MOUTHWASH
OROMUCOSAL | Status: DC | PRN
  Filled 2018-05-20: qty 1000

## 2018-05-20 MED ORDER — SODIUM CHLORIDE 0.9 % IV SOLN
Freq: Once | INTRAVENOUS | Status: AC
  Administered 2018-05-20: 18 mg via INTRAVENOUS
  Filled 2018-05-20: qty 4

## 2018-05-20 NOTE — Progress Notes (Signed)
HEMATOLOGY/ONCOLOGY INPATIENT PROGRESS NOTE  Date of Service: 05/20/2018  Inpatient Attending: .Brunetta Genera, MD   SUBJECTIVE:   Nicholas Moreno is accompanied today by his father at bedside. The pt reports that he is doing well overall.   The pt reports that he has not developed any new concerns. He has continued to endorse good energy levels, is eating well, and denies any nausea or vomiting. The pt notes that he is moving his bowels and is tolerating treatment very well so far.  Lab results today (05/20/18) of CBC w/diff and CMP is as follows: all values are WNL except for WBC at 24.1k, RBC at 3.71, HGB at 10.1, HCT at 32.8, RDW at 19.9, PLT at 423k, Glucose at 111.  On review of systems, pt reports moving his bowels well, eating well, good energy levels, sleeping well, and denies mouth sores, nausea, vomiting, diarrhea, constipation, abdominal pains, and any other symptoms.   OBJECTIVE:  NAD  PHYSICAL EXAMINATION: . Vitals:   05/19/18 2111 05/20/18 0550 05/20/18 1314 05/20/18 1316  BP: 114/66 104/68  111/72  Pulse: 92 84 90 90  Resp: 20 20  18   Temp: 98.1 F (36.7 C) 98.1 F (36.7 C)  97.7 F (36.5 C)  TempSrc: Oral Oral  Oral  SpO2: 100% 99% 99% 99%  Weight:      Height:       Filed Weights   05/19/18 1014  Weight: 129 lb 12.8 oz (58.9 kg)   .Body mass index is 20.95 kg/m.  GENERAL:alert, in no acute distress and comfortable SKIN: skin color, texture, turgor are normal, no rashes or significant lesions EYES: normal, conjunctiva are pink and non-injected, sclera clear OROPHARYNX:no exudate, no erythema and lips, buccal mucosa, and tongue normal  NECK: supple, no JVD, thyroid normal size, non-tender, without nodularity LYMPH:  no palpable lymphadenopathy in the cervical, axillary or inguinal LUNGS: clear to auscultation with normal respiratory effort HEART: regular rate & rhythm,  no murmurs and no lower extremity edema ABDOMEN: abdomen soft,  non-tender, normoactive bowel sounds  Musculoskeletal: no cyanosis of digits and no clubbing  PSYCH: alert & oriented x 3 with fluent speech NEURO: no focal motor/sensory deficits  MEDICAL HISTORY:  History reviewed. No pertinent past medical history.  SURGICAL HISTORY: Past Surgical History:  Procedure Laterality Date  . IR IMAGING GUIDED PORT INSERTION  04/22/2018  . MEDIASTINOSCOPY N/A 03/10/2018   Procedure: MEDIASTINOSCOPY;  Surgeon: Ivin Poot, MD;  Location: [place];  Service: Thoracic;  Laterality: N/A;  . VIDEO ASSISTED THORACOSCOPY (VATS)/ LYMPH NODE SAMPLING Left 03/10/2018   Procedure: VIDEO ASSISTED THORACOSCOPY (VATS)/ BIOSPY ANTERIOR APPROACH;  Surgeon: Ivin Poot, MD;  Location: [place];  Service: Thoracic;  Laterality: Left;  Marland Kitchen VIDEO BRONCHOSCOPY Left 03/10/2018   Procedure: VIDEO BRONCHOSCOPY;  Surgeon: Ivin Poot, MD;  Location: [facility] OR;  Service: Thoracic;  Laterality: Left;    SOCIAL HISTORY: Social History   Socioeconomic History  . Marital status: Single    Spouse name: Not on file  . Number of children: Not on file  . Years of education: Not on file  . Highest education level: Not on file  Occupational History  . Not on file  Social Needs  . Financial resource strain: Not on file  . Food insecurity:    Worry: Not on file    Inability: Not on file  . Transportation needs:    Medical: Not on file    Non-medical: Not on file  Tobacco  Use  . Smoking status: Never Smoker  . Smokeless tobacco: Never Used  Substance and Sexual Activity  . Alcohol use: Not Currently  . Drug use: Not Currently  . Sexual activity: Not on file  Lifestyle  . Physical activity:    Days per week: Not on file    Minutes per session: Not on file  . Stress: Not on file  Relationships  . Social connections:    Talks on phone: Not on file    Gets together: Not on file    Attends religious service: Not on file    Active member of club or organization: Not on file     Attends meetings of clubs or organizations: Not on file    Relationship status: Not on file  . Intimate partner violence:    Fear of current or ex partner: Not on file    Emotionally abused: Not on file    Physically abused: Not on file    Forced sexual activity: Not on file  Other Topics Concern  . Not on file  Social History Narrative  . Not on file    FAMILY HISTORY: History reviewed. No pertinent family history.  ALLERGIES:  has No Known Allergies.  MEDICATIONS:  Scheduled Meds: . apixaban  5 mg Oral BID  . 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 predniSONE  60 mg Oral QAC breakfast   Continuous Infusions: . sodium chloride Stopped (05/19/18 1538)   PRN Meds:.acetaminophen, albuterol, senna-docusate  REVIEW OF SYSTEMS:    10 Point review of Systems was done is negative except as noted above.   LABORATORY DATA:  I have reviewed the data as listed  . CBC Latest Ref Rng & Units 05/20/2018 05/19/2018 05/13/2018  WBC 4.0 - 10.5 K/uL 24.1(H) 10.9(H) 19.9(H)  Hemoglobin 13.0 - 17.0 g/dL 10.1(L) 10.4(L) 10.4(L)  Hematocrit 39.0 - 52.0 % 32.8(L) 33.5(L) 32.5(L)  Platelets 150 - 400 K/uL 423(H) 366 249    . CMP Latest Ref Rng & Units 05/20/2018 05/19/2018 05/13/2018  Glucose 70 - 99 mg/dL 111(H) 103(H) 88  BUN 6 - 20 mg/dL 11 14 10   Creatinine 0.61 - 1.24 mg/dL 0.61 0.67 0.86  Sodium 135 - 145 mmol/L 137 139 141  Potassium 3.5 - 5.1 mmol/L 3.7 3.9 4.2  Chloride 98 - 111 mmol/L 104 105 105  CO2 22 - 32 mmol/L 24 27 26   Calcium 8.9 - 10.3 mg/dL 9.0 9.0 9.5  Total Protein 6.5 - 8.1 g/dL - 6.6 6.6  Total Bilirubin 0.3 - 1.2 mg/dL - 0.5 0.4  Alkaline Phos 38 - 126 U/L - 81 147(H)  AST 15 - 41 U/L - 24 26  ALT 0 - 44 U/L - 34 78(H)     RADIOGRAPHIC STUDIES: I have personally reviewed the radiological images as listed and agreed with the findings in the report. Ir Imaging Guided Port Insertion  Result Date:  04/22/2018 CLINICAL DATA:  History of B-cell non-Hodgkin's mediastinal lymphoma. Currently receiving chemotherapy and in need for porta cath for continued chemotherapy and IV access needs. EXAM: IMPLANTED PORT A CATH PLACEMENT WITH ULTRASOUND AND FLUOROSCOPIC GUIDANCE ANESTHESIA/SEDATION: 4.0 mg IV Versed; 150 mcg IV Fentanyl Total Moderate Sedation Time:  42 minutes. The patient's level of consciousness and physiologic status were continuously monitored during the procedure by Radiology nursing. Additional Medications: 2 g IV Ancef. FLUOROSCOPY TIME:  36 seconds.  2.3 mGy. PROCEDURE: The procedure, risks, benefits, and alternatives were explained to the patient.  Questions regarding the procedure were encouraged and answered. The patient understands and consents to the procedure. A time-out was performed prior to initiating the procedure. Ultrasound was utilized to confirm patency of the right internal jugular vein. The right neck and chest were prepped with chlorhexidine in a sterile fashion, and a sterile drape was applied covering the operative field. Maximum barrier sterile technique with sterile gowns and gloves were used for the procedure. Local anesthesia was provided with 1% lidocaine. After creating a small venotomy incision, a 21 gauge needle was advanced into the right internal jugular vein under direct, real-time ultrasound guidance. Ultrasound image documentation was performed. After securing guidewire access, an 8 Fr dilator was placed. A J-wire was kinked to measure appropriate catheter length. A subcutaneous port pocket was then created along the upper chest wall utilizing sharp and blunt dissection. Portable cautery was utilized. The pocket was irrigated with sterile saline. A single lumen power injectable port was chosen for placement. The 8 Fr catheter was tunneled from the port pocket site to the venotomy incision. The port was placed in the pocket. External catheter was trimmed to appropriate  length based on guidewire measurement. At the venotomy, an 8 Fr peel-away sheath was placed over a guidewire. The catheter was then placed through the sheath and the sheath removed. Final catheter positioning was confirmed and documented with a fluoroscopic spot image. The port was accessed with a needle and aspirated and flushed with heparinized saline. The access needle was removed. The venotomy and port pocket incisions were closed with subcutaneous 3-0 Monocryl and subcuticular 4-0 Vicryl. Dermabond was applied to both incisions. COMPLICATIONS: COMPLICATIONS None FINDINGS: After catheter placement, the tip lies at the cavo-atrial junction. The catheter aspirates normally and is ready for immediate use. IMPRESSION: Placement of single lumen port a cath via right internal jugular vein. The catheter tip lies at the cavo-atrial junction. A power injectable port a cath was placed and is ready for immediate use. Electronically Signed   By: Aletta Edouard M.D.   On: 04/22/2018 16:23    ASSESSMENT & PLAN:  23 y.o. 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  #2 s/p SVC compression due to mediastinal mass without overt SVC syndrome clinical symptoms #3 small acute distal left upper lobe pulmonary embolus-on lovenox #4 Acute Left IJ and Subclavian DVT due to left sided venous compression due to mass and due to malignancy -wason lovenox for malignancy related thrombosis-- now changed to Eliquis #4 left-sided pleural effusion and left-sided lung atelectasis due to airway compression from mediastinal mass -resolved on CXR  #5 significant weight loss of 20 pounds likely due to lymphoma- resolving with treatment.     Wt Readings from Last 3 Encounters:  05/19/18 129 lb 12.8 oz (58.9 kg)  05/13/18 129 lb 4.8 oz (58.7 kg)  04/28/18 122 lb 9.2 oz (55.6 kg)    #6 leucocytosis - likely from high dose steroids. No signs of infection.    PLAN: -Discussed pt labwork today, 05/20/18; WBC at 24.1k, other counts and chemistries are stable -The pt has no prohibitive toxicities from continuing [place] at this time.  Dose escalated L2  -Continue eating well, staying hydrated, and staying as active as reasonably possible  -Chemotherapy orders placed, reviewed and discussed with pharmacist. -Outpatient Rituxan and Neulasta on 05/26/18 -Planning for PET/CT on 05/29/18 -Continue Eliquis 5mg  po BID  -  Suspect anal fissure, recommend using Senna S to allow healing -Salt and baking soda mouthwashes 4-5 times per day to minimize mucositis. We will see daily as inpatient.  Plan for discharge on Friday 2/14 -Will see pt back in clinic on 06/02/18 for toxicity and nadir check  The total time spent in the appt was 25 minutes and more than 50% was on counseling and direct patient cares.    Nicholas Lone MD MS AAHIVMS [facility] Hematology/Oncology Physician [facility]  (Office):       [phone] (Work cell):  [phone] (Fax):           [phone]  05/20/2018 3:32 PM   I, Baldwin Jamaica, am acting as a scribe for Dr. Sullivan Moreno.   .I have reviewed the above documentation for accuracy and completeness, and I agree with the above. Nicholas Lone MD MS

## 2018-05-20 NOTE — Progress Notes (Signed)
Chemo dosages and dilutions verified by 2 chemo RNs 

## 2018-05-21 DIAGNOSIS — C8522 Mediastinal (thymic) large B-cell lymphoma, intrathoracic lymph nodes: Secondary | ICD-10-CM

## 2018-05-21 LAB — CBC
HCT: 32.8 % — ABNORMAL LOW (ref 39.0–52.0)
Hemoglobin: 10.1 g/dL — ABNORMAL LOW (ref 13.0–17.0)
MCH: 26.9 pg (ref 26.0–34.0)
MCHC: 30.8 g/dL (ref 30.0–36.0)
MCV: 87.2 fL (ref 80.0–100.0)
Platelets: 456 10*3/uL — ABNORMAL HIGH (ref 150–400)
RBC: 3.76 MIL/uL — ABNORMAL LOW (ref 4.22–5.81)
RDW: 20.2 % — AB (ref 11.5–15.5)
WBC: 12.1 10*3/uL — AB (ref 4.0–10.5)
nRBC: 0.2 % (ref 0.0–0.2)

## 2018-05-21 LAB — BASIC METABOLIC PANEL
ANION GAP: 8 (ref 5–15)
BUN: 11 mg/dL (ref 6–20)
CO2: 26 mmol/L (ref 22–32)
Calcium: 9.1 mg/dL (ref 8.9–10.3)
Chloride: 107 mmol/L (ref 98–111)
Creatinine, Ser: 0.63 mg/dL (ref 0.61–1.24)
GFR calc Af Amer: >60 mL/min (ref >60)
GFR calc non Af Amer: >60 mL/min (ref >60)
GLUCOSE: 109 mg/dL — AB (ref 70–99)
Potassium: 3.3 mmol/L — ABNORMAL LOW (ref 3.5–5.1)
Sodium: 141 mmol/L (ref 135–145)

## 2018-05-21 MED ORDER — POTASSIUM CHLORIDE CRYS ER 20 MEQ PO TBCR
20.0000 meq | EXTENDED_RELEASE_TABLET | Freq: Two times a day (BID) | ORAL | Status: AC
  Administered 2018-05-21 – 2018-05-22 (×4): 20 meq via ORAL
  Filled 2018-05-21 (×4): qty 1

## 2018-05-21 MED ORDER — SODIUM CHLORIDE 0.9 % IV SOLN
Freq: Once | INTRAVENOUS | Status: AC
  Administered 2018-05-21: 18 mg via INTRAVENOUS
  Filled 2018-05-21: qty 4

## 2018-05-21 MED ORDER — VINCRISTINE SULFATE CHEMO INJECTION 1 MG/ML
Freq: Once | INTRAVENOUS | Status: AC
  Administered 2018-05-21: 11:00:00 via INTRAVENOUS
  Filled 2018-05-21: qty 9

## 2018-05-21 NOTE — Progress Notes (Signed)
HEMATOLOGY/ONCOLOGY INPATIENT PROGRESS NOTE  Date of Service: 05/21/2018  Inpatient Attending: .Brunetta Genera, MD   SUBJECTIVE:   Nicholas Moreno reports that he is doing well overall.   The pt reports that he has not developed any new concerns since yesterday. He notes that he continues eating well and sleeping well. He denies any leg swelling, abdominal pain, nausea, vomiting, or diarrhea.  Lab results today (05/21/18) of CBC and BMP is as follows: all values are WNL except for WBC at 12.1k, RBC at 3.76, HGB at 10.1, HCT at 32.8, RDW at 20.2, PLT at 456k, Potassium at 3.3, Glucose at 109.  On review of systems, pt reports good energy levels, eating well, sleeping well, and denies abdominal pains, leg swelling, nausea, vomiting, diarrhea, and any other symptoms.   OBJECTIVE:  NAD  PHYSICAL EXAMINATION: . Vitals:   05/20/18 1314 05/20/18 1316 05/20/18 2124 05/21/18 0435  BP:  111/72 106/71 110/67  Pulse: 90 90 93 70  Resp:  18 16 19   Temp:  97.7 F (36.5 C) (!) 97.5 F (36.4 C) 97.8 F (36.6 C)  TempSrc:  Oral Oral Oral  SpO2: 99% 99% 99% 97%  Weight:      Height:       Filed Weights   05/19/18 1014  Weight: 129 lb 12.8 oz (58.9 kg)   .Body mass index is 20.95 kg/m.  GENERAL:alert, in no acute distress and comfortable SKIN: no acute rashes, no significant lesions EYES: conjunctiva are pink and non-injected, sclera anicteric OROPHARYNX: MMM, no exudates, no oropharyngeal erythema or ulceration NECK: supple, no JVD LYMPH:  no palpable lymphadenopathy in the cervical, axillary or inguinal regions LUNGS: clear to auscultation b/l with normal respiratory effort HEART: regular rate & rhythm ABDOMEN:  normoactive bowel sounds , non tender, not distended. No palpable hepatosplenomegaly.  Extremity: no pedal edema PSYCH: alert & oriented x 3 with fluent speech NEURO: no focal motor/sensory deficits   MEDICAL HISTORY:  History reviewed. No pertinent past  medical history.  SURGICAL HISTORY: Past Surgical History:  Procedure Laterality Date  . IR IMAGING GUIDED PORT INSERTION  04/22/2018  . MEDIASTINOSCOPY N/A 03/10/2018   Procedure: MEDIASTINOSCOPY;  Surgeon: Ivin Poot, MD;  Location: [place];  Service: Thoracic;  Laterality: N/A;  . VIDEO ASSISTED THORACOSCOPY (VATS)/ LYMPH NODE SAMPLING Left 03/10/2018   Procedure: VIDEO ASSISTED THORACOSCOPY (VATS)/ BIOSPY ANTERIOR APPROACH;  Surgeon: Ivin Poot, MD;  Location: [place];  Service: Thoracic;  Laterality: Left;  Marland Kitchen VIDEO BRONCHOSCOPY Left 03/10/2018   Procedure: VIDEO BRONCHOSCOPY;  Surgeon: Ivin Poot, MD;  Location: [facility] OR;  Service: Thoracic;  Laterality: Left;    SOCIAL HISTORY: Social History   Socioeconomic History  . Marital status: Single    Spouse name: Not on file  . Number of children: Not on file  . Years of education: Not on file  . Highest education level: Not on file  Occupational History  . Not on file  Social Needs  . Financial resource strain: Not on file  . Food insecurity:    Worry: Not on file    Inability: Not on file  . Transportation needs:    Medical: Not on file    Non-medical: Not on file  Tobacco Use  . Smoking status: Never Smoker  . Smokeless tobacco: Never Used  Substance and Sexual Activity  . Alcohol use: Not Currently  . Drug use: Not Currently  . Sexual activity: Not on file  Lifestyle  . Physical  activity:    Days per week: Not on file    Minutes per session: Not on file  . Stress: Not on file  Relationships  . Social connections:    Talks on phone: Not on file    Gets together: Not on file    Attends religious service: Not on file    Active member of club or organization: Not on file    Attends meetings of clubs or organizations: Not on file    Relationship status: Not on file  . Intimate partner violence:    Fear of current or ex partner: Not on file    Emotionally abused: Not on file    Physically abused: Not on  file    Forced sexual activity: Not on file  Other Topics Concern  . Not on file  Social History Narrative  . Not on file    FAMILY HISTORY: History reviewed. No pertinent family history.  ALLERGIES:  has No Known Allergies.  MEDICATIONS:  Scheduled Meds: . apixaban  5 mg Oral BID  . 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 potassium chloride  20 mEq Oral BID  . predniSONE  60 mg Oral QAC breakfast   Continuous Infusions: . sodium chloride Stopped (05/19/18 1538)   PRN Meds:.acetaminophen, albuterol, senna-docusate, sodium bicarbonate/sodium chloride  REVIEW OF SYSTEMS:    A 10+ POINT REVIEW OF SYSTEMS WAS OBTAINED including neurology, dermatology, psychiatry, cardiac, respiratory, lymph, extremities, GI, GU, Musculoskeletal, constitutional, breasts, reproductive, HEENT.  All pertinent positives are noted in the HPI.  All others are negative.   LABORATORY DATA:  I have reviewed the data as listed  . CBC Latest Ref Rng & Units 05/21/2018 05/20/2018 05/19/2018  WBC 4.0 - 10.5 K/uL 12.1(H) 24.1(H) 10.9(H)  Hemoglobin 13.0 - 17.0 g/dL 10.1(L) 10.1(L) 10.4(L)  Hematocrit 39.0 - 52.0 % 32.8(L) 32.8(L) 33.5(L)  Platelets 150 - 400 K/uL 456(H) 423(H) 366    . CMP Latest Ref Rng & Units 05/21/2018 05/20/2018 05/19/2018  Glucose 70 - 99 mg/dL 109(H) 111(H) 103(H)  BUN 6 - 20 mg/dL 11 11 14   Creatinine 0.61 - 1.24 mg/dL 0.63 0.61 0.67  Sodium 135 - 145 mmol/L 141 137 139  Potassium 3.5 - 5.1 mmol/L 3.3(L) 3.7 3.9  Chloride 98 - 111 mmol/L 107 104 105  CO2 22 - 32 mmol/L 26 24 27   Calcium 8.9 - 10.3 mg/dL 9.1 9.0 9.0  Total Protein 6.5 - 8.1 g/dL - - 6.6  Total Bilirubin 0.3 - 1.2 mg/dL - - 0.5  Alkaline Phos 38 - 126 U/L - - 81  AST 15 - 41 U/L - - 24  ALT 0 - 44 U/L - - 34     RADIOGRAPHIC STUDIES: I have personally reviewed the radiological images as listed and agreed with the findings in the report. Ir Imaging Guided Port  Insertion  Result Date: 04/22/2018 CLINICAL DATA:  History of B-cell non-Hodgkin's mediastinal lymphoma. Currently receiving chemotherapy and in need for porta cath for continued chemotherapy and IV access needs. EXAM: IMPLANTED PORT A CATH PLACEMENT WITH ULTRASOUND AND FLUOROSCOPIC GUIDANCE ANESTHESIA/SEDATION: 4.0 mg IV Versed; 150 mcg IV Fentanyl Total Moderate Sedation Time:  42 minutes. The patient's level of consciousness and physiologic status were continuously monitored during the procedure by Radiology nursing. Additional Medications: 2 g IV Ancef. FLUOROSCOPY TIME:  36 seconds.  2.3 mGy. PROCEDURE: The procedure, risks, benefits, and alternatives were explained to the patient. Questions regarding the procedure were encouraged  and answered. The patient understands and consents to the procedure. A time-out was performed prior to initiating the procedure. Ultrasound was utilized to confirm patency of the right internal jugular vein. The right neck and chest were prepped with chlorhexidine in a sterile fashion, and a sterile drape was applied covering the operative field. Maximum barrier sterile technique with sterile gowns and gloves were used for the procedure. Local anesthesia was provided with 1% lidocaine. After creating a small venotomy incision, a 21 gauge needle was advanced into the right internal jugular vein under direct, real-time ultrasound guidance. Ultrasound image documentation was performed. After securing guidewire access, an 8 Fr dilator was placed. A J-wire was kinked to measure appropriate catheter length. A subcutaneous port pocket was then created along the upper chest wall utilizing sharp and blunt dissection. Portable cautery was utilized. The pocket was irrigated with sterile saline. A single lumen power injectable port was chosen for placement. The 8 Fr catheter was tunneled from the port pocket site to the venotomy incision. The port was placed in the pocket. External catheter was  trimmed to appropriate length based on guidewire measurement. At the venotomy, an 8 Fr peel-away sheath was placed over a guidewire. The catheter was then placed through the sheath and the sheath removed. Final catheter positioning was confirmed and documented with a fluoroscopic spot image. The port was accessed with a needle and aspirated and flushed with heparinized saline. The access needle was removed. The venotomy and port pocket incisions were closed with subcutaneous 3-0 Monocryl and subcuticular 4-0 Vicryl. Dermabond was applied to both incisions. COMPLICATIONS: COMPLICATIONS None FINDINGS: After catheter placement, the tip lies at the cavo-atrial junction. The catheter aspirates normally and is ready for immediate use. IMPRESSION: Placement of single lumen port a cath via right internal jugular vein. The catheter tip lies at the cavo-atrial junction. A power injectable port a cath was placed and is ready for immediate use. Electronically Signed   By: Aletta Edouard M.D.   On: 04/22/2018 16:23    ASSESSMENT & PLAN:   23 y.o. 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  #2 s/p SVC compression due to mediastinal mass without overt SVC syndrome clinical symptoms #3 small acute distal left upper lobe pulmonary embolus-on lovenox #4 Acute Left IJ and Subclavian DVT due to left sided venous compression due to mass and due to malignancy -wason lovenox for malignancy related thrombosis-- now changed to Eliquis #4 left-sided pleural effusion and left-sided lung atelectasis due to airway compression from mediastinal mass -resolved on CXR   PLAN: -Discussed pt labwork today, 05/21/18; blood counts and chemistries are stable -The pt has no prohibitive toxicities from continuing [place] at this time -Continue eating well, staying hydrated, and  staying as active as reasonably possible  -Chemotherapy orders placed, reviewed and discussed with pharmacist. -Outpatient Rituxan and Neulasta on 05/26/18 - PET/CT on 05/29/18 -Continue Eliquis 5mg  po BID  -Suspect anal fissure, recommend using Senna S to allow healing -Salt and baking soda mouthwashes 4-5 times per day to minimize mucositis. -We will see daily as inpatient.  Plan for discharge on Friday 2/14 -Will see pt back in clinic on 06/02/18 for toxicity and nadir check  The total time spent in the appt was 25 minutes and more than 50% was on counseling and direct patient cares.    Sullivan Lone MD MS AAHIVMS  [facility] Hematology/Oncology Physician [place]  (Office):       [phone] (Work cell):  [phone] (Fax):           [phone]  05/21/2018 12:55 PM   I, Baldwin Jamaica, am acting as a scribe for Dr. Sullivan Lone.   .I have reviewed the above documentation for accuracy and completeness, and I agree with the above. Sullivan Lone MD MS

## 2018-05-21 NOTE — Progress Notes (Signed)
Epoch dosages and calculations verified with Maudie Mercury, RN.

## 2018-05-22 LAB — BASIC METABOLIC PANEL
Anion gap: 5 (ref 5–15)
BUN: 13 mg/dL (ref 6–20)
CO2: 28 mmol/L (ref 22–32)
Calcium: 9.2 mg/dL (ref 8.9–10.3)
Chloride: 107 mmol/L (ref 98–111)
Creatinine, Ser: 0.61 mg/dL (ref 0.61–1.24)
GFR calc Af Amer: >60 mL/min (ref >60)
GFR calc non Af Amer: >60 mL/min (ref >60)
Glucose, Bld: 103 mg/dL — ABNORMAL HIGH (ref 70–99)
POTASSIUM: 3.6 mmol/L (ref 3.5–5.1)
Sodium: 140 mmol/L (ref 135–145)

## 2018-05-22 LAB — CBC
HCT: 33.3 % — ABNORMAL LOW (ref 39.0–52.0)
Hemoglobin: 10.3 g/dL — ABNORMAL LOW (ref 13.0–17.0)
MCH: 27.2 pg (ref 26.0–34.0)
MCHC: 30.9 g/dL (ref 30.0–36.0)
MCV: 87.9 fL (ref 80.0–100.0)
Platelets: 439 10*3/uL — ABNORMAL HIGH (ref 150–400)
RBC: 3.79 MIL/uL — AB (ref 4.22–5.81)
RDW: 19.7 % — ABNORMAL HIGH (ref 11.5–15.5)
WBC: 7.7 10*3/uL (ref 4.0–10.5)
nRBC: 0 % (ref 0.0–0.2)

## 2018-05-22 MED ORDER — SODIUM CHLORIDE 0.9 % IV SOLN
Freq: Once | INTRAVENOUS | Status: AC
  Administered 2018-05-22: 18 mg via INTRAVENOUS
  Filled 2018-05-22: qty 4

## 2018-05-22 MED ORDER — VINCRISTINE SULFATE CHEMO INJECTION 1 MG/ML
Freq: Once | INTRAVENOUS | Status: AC
  Administered 2018-05-22: 11:00:00 via INTRAVENOUS
  Filled 2018-05-22: qty 9

## 2018-05-22 NOTE — Progress Notes (Signed)
HEMATOLOGY/ONCOLOGY INPATIENT PROGRESS NOTE  Date of Service: 05/22/2018  Inpatient Attending: .Brunetta Genera, MD   SUBJECTIVE:   Nicholas Moreno reports that he is doing well overall.   The pt reports that he has developed some mild nausea, but notes he is moving his bowels and is able to eat well. He denies any mouth sores or extremity swelling.  Lab results today (05/22/18) of CBC and BMP is as follows: all values are WNL except for RBC at 3.79, HGB at 10.3, HCT at 33.3, RDW at 19.7, PLT at 439k, Glucose at 103.  On review of systems, pt reports eating well, moving his bowels well, stable energy levels, and denies abdominal pains, concerns for bleeding, abnormal bruising, headaches, leg swelling, vomiting diarrhea, constipation, mouth sores, arm swelling, and any other symptoms.  OBJECTIVE:  NAD  PHYSICAL EXAMINATION: . Vitals:   05/21/18 1333 05/21/18 2136 05/22/18 0558 05/22/18 1455  BP: 114/65 117/64 (!) 111/56 100/61  Pulse: 88 79 73 74  Resp: 20 15 15    Temp: 98.2 F (36.8 C) 97.8 F (36.6 C) 98.1 F (36.7 C) 98 F (36.7 C)  TempSrc: Oral Oral Oral Oral  SpO2: 100% 100% 99% 100%  Weight:      Height:       Filed Weights   05/19/18 1014  Weight: 129 lb 12.8 oz (58.9 kg)   .Body mass index is 20.95 kg/m.  GENERAL:alert, in no acute distress and comfortable SKIN: no acute rashes, no significant lesions EYES: conjunctiva are pink and non-injected, sclera anicteric OROPHARYNX: MMM, no exudates, no oropharyngeal erythema or ulceration NECK: supple, no JVD LYMPH:  no palpable lymphadenopathy in the cervical, axillary or inguinal regions LUNGS: clear to auscultation b/l with normal respiratory effort HEART: regular rate & rhythm ABDOMEN:  normoactive bowel sounds , non tender, not distended. No palpable hepatosplenomegaly.  Extremity: no pedal edema PSYCH: alert & oriented x 3 with fluent speech NEURO: no focal motor/sensory deficits   MEDICAL  HISTORY:  History reviewed. No pertinent past medical history.  SURGICAL HISTORY: Past Surgical History:  Procedure Laterality Date  . IR IMAGING GUIDED PORT INSERTION  04/22/2018  . MEDIASTINOSCOPY N/A 03/10/2018   Procedure: MEDIASTINOSCOPY;  Surgeon: Ivin Poot, MD;  Location: [place];  Service: Thoracic;  Laterality: N/A;  . VIDEO ASSISTED THORACOSCOPY (VATS)/ LYMPH NODE SAMPLING Left 03/10/2018   Procedure: VIDEO ASSISTED THORACOSCOPY (VATS)/ BIOSPY ANTERIOR APPROACH;  Surgeon: Ivin Poot, MD;  Location: [place];  Service: Thoracic;  Laterality: Left;  Marland Kitchen VIDEO BRONCHOSCOPY Left 03/10/2018   Procedure: VIDEO BRONCHOSCOPY;  Surgeon: Ivin Poot, MD;  Location: [facility] OR;  Service: Thoracic;  Laterality: Left;    SOCIAL HISTORY: Social History   Socioeconomic History  . Marital status: Single    Spouse name: Not on file  . Number of children: Not on file  . Years of education: Not on file  . Highest education level: Not on file  Occupational History  . Not on file  Social Needs  . Financial resource strain: Not on file  . Food insecurity:    Worry: Not on file    Inability: Not on file  . Transportation needs:    Medical: Not on file    Non-medical: Not on file  Tobacco Use  . Smoking status: Never Smoker  . Smokeless tobacco: Never Used  Substance and Sexual Activity  . Alcohol use: Not Currently  . Drug use: Not Currently  . Sexual activity: Not on file  Lifestyle  . Physical activity:    Days per week: Not on file    Minutes per session: Not on file  . Stress: Not on file  Relationships  . Social connections:    Talks on phone: Not on file    Gets together: Not on file    Attends religious service: Not on file    Active member of club or organization: Not on file    Attends meetings of clubs or organizations: Not on file    Relationship status: Not on file  . Intimate partner violence:    Fear of current or ex partner: Not on file    Emotionally  abused: Not on file    Physically abused: Not on file    Forced sexual activity: Not on file  Other Topics Concern  . Not on file  Social History Narrative  . Not on file    FAMILY HISTORY: History reviewed. No pertinent family history.  ALLERGIES:  has No Known Allergies.  MEDICATIONS:  Scheduled Meds: . apixaban  5 mg Oral BID  . 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 potassium chloride  20 mEq Oral BID  . predniSONE  60 mg Oral QAC breakfast   Continuous Infusions: . sodium chloride Stopped (05/19/18 1538)   PRN Meds:.acetaminophen, albuterol, senna-docusate, sodium bicarbonate/sodium chloride  REVIEW OF SYSTEMS:    A 10+ POINT REVIEW OF SYSTEMS WAS OBTAINED including neurology, dermatology, psychiatry, cardiac, respiratory, lymph, extremities, GI, GU, Musculoskeletal, constitutional, breasts, reproductive, HEENT.  All pertinent positives are noted in the HPI.  All others are negative.   LABORATORY DATA:  I have reviewed the data as listed  . CBC Latest Ref Rng & Units 05/22/2018 05/21/2018 05/20/2018  WBC 4.0 - 10.5 K/uL 7.7 12.1(H) 24.1(H)  Hemoglobin 13.0 - 17.0 g/dL 10.3(L) 10.1(L) 10.1(L)  Hematocrit 39.0 - 52.0 % 33.3(L) 32.8(L) 32.8(L)  Platelets 150 - 400 K/uL 439(H) 456(H) 423(H)    . CMP Latest Ref Rng & Units 05/22/2018 05/21/2018 05/20/2018  Glucose 70 - 99 mg/dL 103(H) 109(H) 111(H)  BUN 6 - 20 mg/dL 13 11 11   Creatinine 0.61 - 1.24 mg/dL 0.61 0.63 0.61  Sodium 135 - 145 mmol/L 140 141 137  Potassium 3.5 - 5.1 mmol/L 3.6 3.3(L) 3.7  Chloride 98 - 111 mmol/L 107 107 104  CO2 22 - 32 mmol/L 28 26 24   Calcium 8.9 - 10.3 mg/dL 9.2 9.1 9.0  Total Protein 6.5 - 8.1 g/dL - - -  Total Bilirubin 0.3 - 1.2 mg/dL - - -  Alkaline Phos 38 - 126 U/L - - -  AST 15 - 41 U/L - - -  ALT 0 - 44 U/L - - -     RADIOGRAPHIC STUDIES: I have personally reviewed the radiological images as listed and agreed with the  findings in the report. No results found.  ASSESSMENT & PLAN:   23 y.o. 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  #2 s/p SVC compression due to mediastinal mass without overt SVC syndrome clinical symptoms #3 small acute distal left upper lobe pulmonary embolus-on lovenox #4 Acute Left IJ and Subclavian DVT due to left sided venous compression due to mass and due to malignancy -wason lovenox for malignancy related thrombosis-- now changed to Eliquis #4 left-sided pleural effusion and left-sided lung atelectasis due to airway compression  from mediastinal mass -resolved on CXR   PLAN: -Discussed pt labwork today, 05/22/18; blood counts and chemistries are stable -The pt has no prohibitive toxicities from continuing [place] at this time.   -Continue eating well, staying hydrated, and staying as active as reasonably possible  -Chemotherapy orders placed, reviewed and discussed with pharmacist. -Outpatient Rituxan and Neulasta on 05/26/18 - PET/CT on 05/29/18 -Continue Eliquis 5mg  po BID  -Suspect anal fissure, recommend using Senna S to allow healing -Salt and baking soda mouthwashes 4-5 times per day to minimize mucositis. -We will see daily as inpatient.  Plan for discharge on Friday 2/14 -Will see pt back in clinic on 06/02/18 for toxicity and nadir check   The total time spent in the appt was 25 minutes and more than 50% was on counseling and direct patient cares.    Nicholas Lone MD MS AAHIVMS [facility] Hematology/Oncology Physician [facility]  (Office):       [phone] (Work cell):  [phone] (Fax):           [phone]  05/22/2018 4:04 PM   I, Baldwin Jamaica, am acting as a scribe for Dr. Sullivan Moreno.   .I have reviewed the above documentation for accuracy and completeness, and I agree with the  above. Nicholas Lone MD MS

## 2018-05-23 ENCOUNTER — Other Ambulatory Visit: Payer: Self-pay | Admitting: *Deleted

## 2018-05-23 LAB — CBC
HCT: 33.3 % — ABNORMAL LOW (ref 39.0–52.0)
Hemoglobin: 10.5 g/dL — ABNORMAL LOW (ref 13.0–17.0)
MCH: 27.3 pg (ref 26.0–34.0)
MCHC: 31.5 g/dL (ref 30.0–36.0)
MCV: 86.7 fL (ref 80.0–100.0)
Platelets: 474 10*3/uL — ABNORMAL HIGH (ref 150–400)
RBC: 3.84 MIL/uL — ABNORMAL LOW (ref 4.22–5.81)
RDW: 19 % — ABNORMAL HIGH (ref 11.5–15.5)
WBC: 5.3 10*3/uL (ref 4.0–10.5)
nRBC: 0 % (ref 0.0–0.2)

## 2018-05-23 LAB — BASIC METABOLIC PANEL
Anion gap: 9 (ref 5–15)
BUN: 12 mg/dL (ref 6–20)
CO2: 27 mmol/L (ref 22–32)
Calcium: 9.4 mg/dL (ref 8.9–10.3)
Chloride: 103 mmol/L (ref 98–111)
Creatinine, Ser: 0.58 mg/dL — ABNORMAL LOW (ref 0.61–1.24)
GFR calc non Af Amer: >60 mL/min (ref >60)
GLUCOSE: 98 mg/dL (ref 70–99)
Potassium: 3.6 mmol/L (ref 3.5–5.1)
Sodium: 139 mmol/L (ref 135–145)

## 2018-05-23 MED ORDER — HEPARIN SOD (PORK) LOCK FLUSH 100 UNIT/ML IV SOLN
500.0000 [IU] | Freq: Once | INTRAVENOUS | Status: AC
  Administered 2018-05-23: 500 [IU] via INTRAVENOUS
  Filled 2018-05-23: qty 5

## 2018-05-23 MED ORDER — SODIUM CHLORIDE 0.9 % IV SOLN
Freq: Once | INTRAVENOUS | Status: AC
  Administered 2018-05-23: 8 mg via INTRAVENOUS
  Filled 2018-05-23: qty 8

## 2018-05-23 MED ORDER — SODIUM CHLORIDE 0.9 % IV SOLN
900.0000 mg/m2 | Freq: Once | INTRAVENOUS | Status: AC
  Administered 2018-05-23: 1420 mg via INTRAVENOUS
  Filled 2018-05-23: qty 50

## 2018-05-23 NOTE — Discharge Summary (Signed)
.  [place]  [phone] [phone]    Physician Discharge Summary     Patient ID: Nicholas Moreno MRN: [id number] DOB/AGE: 31-Jul-1995 23 y.o.  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   apixaban 5 MG Tabs tablet Commonly known as:  ELIQUIS Take 1 tablet (5 mg total) by mouth 2 (two) times daily.   fluticasone 110 MCG/ACT inhaler Commonly known as:  FLOVENT HFA Inhale 2 puffs into the lungs 2 (two) times daily as needed (asthma related symptoms).   polyethylene glycol packet Commonly known as:  MIRALAX / GLYCOLAX Take 17 g by mouth daily.   senna-docusate 8.6-50 MG tablet Commonly known as:  Senokot-S Take 2 tablets by mouth at bedtime as needed for mild constipation.        Disposition and Follow-up:  -Outpatient Rituxan and Neulasta on 05/26/18 - PET/CTon 05/29/18 --Will see pt back in clinic on 06/02/18 for toxicity and nadir check  Significant Diagnostic Studies:  No results found.  Discharge Laboratory Values: . CBC Latest Ref Rng & Units 05/23/2018 05/22/2018 05/21/2018  WBC 4.0 - 10.5 K/uL 5.3 7.7 12.1(H)  Hemoglobin 13.0 - 17.0 g/dL 10.5(L) 10.3(L) 10.1(L)  Hematocrit 39.0 - 52.0 % 33.3(L) 33.3(L) 32.8(L)  Platelets 150 - 400 K/uL 474(H) 439(H) 456(H)   . CMP Latest  Ref Rng & Units 05/23/2018 05/22/2018 05/21/2018  Glucose 70 - 99 mg/dL 98 103(H) 109(H)  BUN 6 - 20 mg/dL 12 13 11   Creatinine 0.61 - 1.24 mg/dL 0.58(L) 0.61 0.63  Sodium 135 - 145 mmol/L 139 140 141  Potassium 3.5 - 5.1 mmol/L 3.6 3.6 3.3(L)  Chloride 98 - 111 mmol/L 103 107 107  CO2 22 - 32 mmol/L 27 28 26   Calcium 8.9 - 10.3 mg/dL 9.4 9.2 9.1  Total Protein 6.5 - 8.1 g/dL - - -  Total Bilirubin 0.3 - 1.2 mg/dL - - -  Alkaline Phos 38 - 126 U/L - - -  AST 15 - 41 U/L - - -  ALT 0 - 44 U/L - - -     Brief H and P: For complete details please refer to admission H and P, but in brief, Nicholas Moreno is a wonderful 23 y.o. male who has been admitted today for [place] of his Primary Mediastinal B Cell Non Hodgkin's Lymphoma. He reports that he is doing well overall.   The pt reports that he has not developed any concerns since our last visit in clinic. He endorses good energy levels, has been eating well, and has rested well. He denies any mouth sores or concerns for infections. He notes that he has gained weight in the interim as well.  Lab results today (05/19/18) of CBC w/diff and CMP is as follows: all values are WNL except for WBC at  10.9k, RBC at 3.79, HGB at 10.4, HCT at 33.5, RDW at 20.4, nRBC at 0.5%, ANC at 8.9k, Abs immature granulocytes at 0.11k, Glucose at 103.  Issues during hospitalization 23 y.o. male with  #1Recently diagnosed Primary Mediastinal B cell Non Hodgkins lymphoma c-MYC negative Not a double hit lymphoma Pathology confirmed by pathology read at [facility].  #2 s/p SVC compression due to mediastinal mass without overt SVC syndrome clinical symptoms #3 small acute distal left upper lobe pulmonary embolus-on lovenox #4 Acute Left IJ and Subclavian DVT due to left sided venous compression due to mass and due to malignancy -wason lovenox for malignancy related thrombosis-- now changed to Eliquis #4 left-sided pleural effusion  and left-sided lung atelectasis due to airway compression from mediastinal mass -resolved on CXR   PLAN: -Discussed pt labwork today, 05/22/18; blood counts and chemistries are stable -The pt has no prohibitive toxicities from continuing [place] at this time.   -Continue eating well, staying hydrated, and staying as active as reasonably possible  -Chemotherapy orders placed, reviewed and discussed with pharmacist. -Outpatient Rituxan and Neulasta on 05/26/18 - PET/CTon 05/29/18 -Continue Eliquis 5mg  po BID  -Suspect anal fissure, recommend using Senna S to allow healing -Salt and baking soda mouthwashes 4-5 times per dayto minimize mucositis. -We will see daily as inpatient.Plan for discharge on Friday 2/14 -Will see pt back in clinic on 06/02/18 for toxicity and nadir check    Physical Exam at Discharge: BP (!) 103/59 (BP Location: Left Arm)   Pulse (!) 58   Temp 97.8 F (36.6 C) (Oral)   Resp 18   Ht 5\' 6"  (1.676 m)   Wt 129 lb 12.8 oz (58.9 kg)   SpO2 98%   BMI 20.95 kg/m  . GENERAL:alert, in no acute distress and comfortable SKIN: no acute rashes, no significant lesions EYES: conjunctiva are pink and non-injected, sclera anicteric OROPHARYNX: MMM, no exudates, no oropharyngeal erythema or ulceration NECK: supple, no JVD LYMPH:  no palpable lymphadenopathy in the cervical, axillary or inguinal regions LUNGS: clear to auscultation b/l with normal respiratory effort HEART: regular rate & rhythm ABDOMEN:  normoactive bowel sounds , non tender, not distended. 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 Sullivan Lone MD [place] [phone]  05/23/2018, 11:28 AM   TT  spent discharging patient>63mins

## 2018-05-23 NOTE — Progress Notes (Signed)
Pt d/c'd home with a family member. Nicholas Moreno verbalized understanding of instructions

## 2018-05-26 ENCOUNTER — Inpatient Hospital Stay: Payer: BLUE CROSS/BLUE SHIELD

## 2018-05-26 ENCOUNTER — Other Ambulatory Visit: Payer: BLUE CROSS/BLUE SHIELD

## 2018-05-26 VITALS — BP 103/70 | HR 89 | Temp 98.5°F | Resp 16 | Ht 66.0 in | Wt 131.8 lb

## 2018-05-26 DIAGNOSIS — I82402 Acute embolism and thrombosis of unspecified deep veins of left lower extremity: Secondary | ICD-10-CM | POA: Diagnosis not present

## 2018-05-26 DIAGNOSIS — C8528 Mediastinal (thymic) large B-cell lymphoma, lymph nodes of multiple sites: Secondary | ICD-10-CM | POA: Diagnosis present

## 2018-05-26 DIAGNOSIS — Z7901 Long term (current) use of anticoagulants: Secondary | ICD-10-CM | POA: Diagnosis not present

## 2018-05-26 DIAGNOSIS — K5900 Constipation, unspecified: Secondary | ICD-10-CM | POA: Diagnosis not present

## 2018-05-26 DIAGNOSIS — I2699 Other pulmonary embolism without acute cor pulmonale: Secondary | ICD-10-CM | POA: Diagnosis not present

## 2018-05-26 DIAGNOSIS — Z5189 Encounter for other specified aftercare: Secondary | ICD-10-CM | POA: Diagnosis not present

## 2018-05-26 DIAGNOSIS — E876 Hypokalemia: Secondary | ICD-10-CM | POA: Diagnosis not present

## 2018-05-26 DIAGNOSIS — Z5112 Encounter for antineoplastic immunotherapy: Secondary | ICD-10-CM | POA: Diagnosis present

## 2018-05-26 MED ORDER — PEGFILGRASTIM-CBQV 6 MG/0.6ML ~~LOC~~ SOSY
PREFILLED_SYRINGE | SUBCUTANEOUS | Status: AC
  Filled 2018-05-26: qty 0.6

## 2018-05-26 MED ORDER — SODIUM CHLORIDE 0.9% FLUSH
10.0000 mL | INTRAVENOUS | Status: DC | PRN
  Administered 2018-05-26: 10 mL
  Filled 2018-05-26: qty 10

## 2018-05-26 MED ORDER — SODIUM CHLORIDE 0.9 % IV SOLN
Freq: Once | INTRAVENOUS | Status: AC
  Administered 2018-05-26: 09:00:00 via INTRAVENOUS
  Filled 2018-05-26: qty 250

## 2018-05-26 MED ORDER — HEPARIN SOD (PORK) LOCK FLUSH 100 UNIT/ML IV SOLN
500.0000 [IU] | Freq: Once | INTRAVENOUS | Status: AC | PRN
  Administered 2018-05-26: 500 [IU]
  Filled 2018-05-26: qty 5

## 2018-05-26 MED ORDER — DIPHENHYDRAMINE HCL 25 MG PO CAPS
50.0000 mg | ORAL_CAPSULE | Freq: Once | ORAL | Status: AC
  Administered 2018-05-26: 50 mg via ORAL

## 2018-05-26 MED ORDER — ACETAMINOPHEN 325 MG PO TABS
ORAL_TABLET | ORAL | Status: AC
  Filled 2018-05-26: qty 2

## 2018-05-26 MED ORDER — PEGFILGRASTIM-CBQV 6 MG/0.6ML ~~LOC~~ SOSY
6.0000 mg | PREFILLED_SYRINGE | Freq: Once | SUBCUTANEOUS | Status: AC
  Administered 2018-05-26: 6 mg via SUBCUTANEOUS

## 2018-05-26 MED ORDER — SODIUM CHLORIDE 0.9 % IV SOLN
375.0000 mg/m2 | Freq: Once | INTRAVENOUS | Status: AC
  Administered 2018-05-26: 600 mg via INTRAVENOUS
  Filled 2018-05-26: qty 50

## 2018-05-26 MED ORDER — DIPHENHYDRAMINE HCL 25 MG PO CAPS
ORAL_CAPSULE | ORAL | Status: AC
  Filled 2018-05-26: qty 2

## 2018-05-26 MED ORDER — ACETAMINOPHEN 325 MG PO TABS
650.0000 mg | ORAL_TABLET | Freq: Once | ORAL | Status: AC
  Administered 2018-05-26: 650 mg via ORAL

## 2018-05-26 NOTE — Patient Instructions (Signed)
[facility] Discharge Instructions for Patients Receiving Chemotherapy  Today you received the following chemotherapy agents :  Rituxan,  Udenyca.  To help prevent nausea and vomiting after your treatment, we encourage you to take your nausea medication as prescribed.   If you develop nausea and vomiting that is not controlled by your nausea medication, call the clinic.   BELOW ARE SYMPTOMS THAT SHOULD BE REPORTED IMMEDIATELY:  *FEVER GREATER THAN 100.5 F  *CHILLS WITH OR WITHOUT FEVER  NAUSEA AND VOMITING THAT IS NOT CONTROLLED WITH YOUR NAUSEA MEDICATION  *UNUSUAL SHORTNESS OF BREATH  *UNUSUAL BRUISING OR BLEEDING  TENDERNESS IN MOUTH AND THROAT WITH OR WITHOUT PRESENCE OF ULCERS  *URINARY PROBLEMS  *BOWEL PROBLEMS  UNUSUAL RASH Items with * indicate a potential emergency and should be followed up as soon as possible.  Feel free to call the clinic should you have any questions or concerns. The clinic phone number is [phone].  Please show the CHEMO ALERT CARD at check-in to the Emergency Department and triage nurse.   

## 2018-06-02 NOTE — Progress Notes (Signed)
HEMATOLOGY/ONCOLOGY CLINIC NOTE  Date of Service: 06/03/2018  Patient Care Team: Patient, No Pcp Per as PCP - General (General Practice)  CHIEF COMPLAINTS/PURPOSE OF CONSULTATION:  Recently diagnosed Primary mediastinal B cell Non hodgkins lymphoma  HISTORY OF PRESENTING ILLNESS:  Nicholas Moreno is a wonderful 23 y.o. 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 Patient notes that he started feeling unwell in August when he noted gradually increasing fatigue and weight loss.  Patient notes subsequently in September and October he developed new cough with progressive shortness of breath which triggered multiple visits with his primary care physician and was treated symptomatically and with inhalers.  Patient reports no imaging studies at the time.  Patient reports he has felt poorly during the last 2 to 3 months and during his recent visits to the [place] in [place]. He reports a total unintentional weight loss of close to 20 pounds. He also reports drenching night sweats over the last 4 to 6 weeks.  He reports that his cough and shortness of breath progressively got much more severe which led to him presenting to the emergency room yesterday. Notes nonproductive cough. No overt fevers.  Patient had a CTA of the chest on 03/07/2018 which showed  "Small acute pulmonary emboli identified within the distal left upper lobe lobar pulmonary artery. 2. 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 Primary differential considerations include lymphoma and leukemia as well as malignant derm cell tumors.  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 Narrowing and displacement of the trachea with complete occlusion of the upper lobe bronchi. There is also marked narrowing of bilateral upper lobe pulmonary arteries. 3. Significantly diminished aeration to both upper lobes, left greater than right. 4. Moderate left pleural effusion."  Patient is currently on room air and saturating 99% with stable hemodynamics. He has been started on IV heparin due to his small pulmonary embolism. 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  No headaches.  No facial swelling.  No upper extremity swelling.  No overt clinical symptomatology of SVC syndrome despite radiographic findings of some SVC narrowing.  No overt chest pain at this time.  Interval History:   Nicholas Moreno returns today for management and evaluation of his Newly diagnosed Primary mediastinal B cell Non hodgkins lymphoma. I last saw the pt on 05/23/18 with C4 EPOCH-R discharge. The pt reports that he is doing well overall.   The pt reports that he has noticed some mild skin irritation/soreness near his left armpit, but feels that this could be related to his deodorant. He denies arm swelling, nodularity, or pain.  The pt notes that his voice has continued to strengthen and he is very pleased with this.   He denies any fevers, chills, or concerns of infection at this time. He continues to endorse good energy levels, is eating well, and is moving his bowels well.  Lab results today (06/03/18) of CBC w/diff and  CMP is as follows: all values are WNL except for WBC at 14.6k, RBC at 3.92, HGB at 10.8, HCT at 34.2, RDW at 19.9, nRBC at 0.9%, ANC at 10.9k, Monocytes abs at 1.4k, Glucose at 102,  Total Bilirubin at 0.2.  On review of systems, pt reports eating well, stable energy levels, moving his bowels well, and denies fevers, chills, leg swelling, abdominal pains, mouth sores, testicular pain or swelling, leg swelling, arm swelling, and any other symptoms.  MEDICAL HISTORY:  No past medical history on file.  SURGICAL HISTORY: Past Surgical History:  Procedure Laterality Date  . IR IMAGING GUIDED PORT INSERTION  04/22/2018  . MEDIASTINOSCOPY N/A 03/10/2018   Procedure: MEDIASTINOSCOPY;  Surgeon: Ivin Poot, MD;  Location: [facility];  Service: Thoracic;  Laterality: N/A;  . VIDEO ASSISTED THORACOSCOPY (VATS)/ LYMPH NODE SAMPLING Left 03/10/2018   Procedure: VIDEO ASSISTED THORACOSCOPY (VATS)/ BIOSPY ANTERIOR APPROACH;  Surgeon: Ivin Poot, MD;  Location: [facility];  Service: Thoracic;  Laterality: Left;  Marland Kitchen VIDEO BRONCHOSCOPY Left 03/10/2018   Procedure: VIDEO BRONCHOSCOPY;  Surgeon: Ivin Poot, MD;  Location: [facility] OR;  Service: Thoracic;  Laterality: Left;    SOCIAL HISTORY: Social History   Socioeconomic History  . Marital status: Single    Spouse name: Not on file  . Number of children: Not on file  . Years of education: Not on file  . Highest education level: Not on file  Occupational History  . Not on file  Social Needs  . Financial resource strain: Not on file  . Food insecurity:    Worry: Not on file    Inability: Not on file  . Transportation needs:    Medical: Not on file    Non-medical: Not on file  Tobacco Use  . Smoking status: Never Smoker  . Smokeless tobacco: Never Used  Substance and Sexual Activity  . Alcohol use: Not Currently  . Drug use: Not Currently  . Sexual activity: Not on file  Lifestyle  . Physical activity:    Days per week: Not on file    Minutes per session: Not on file  . Stress: Not on file  Relationships  . Social connections:    Talks on phone: Not on file    Gets together: Not on file    Attends religious service:  Not on file    Active member of club or organization: Not on file    Attends meetings of clubs or organizations: Not on file    Relationship status: Not on file  . Intimate partner violence:    Fear of current or ex partner: Not on file    Emotionally abused: Not on file    Physically abused: Not on file    Forced sexual activity: Not on file  Other Topics Concern  . Not on file  Social History Narrative  . Not on file    FAMILY HISTORY: No family history on file.  ALLERGIES:  has No Known Allergies.  MEDICATIONS:  Current Outpatient Medications  Medication Sig Dispense Refill  . albuterol (PROVENTIL HFA;VENTOLIN HFA) 108 (90 Base) MCG/ACT inhaler Inhale 2 puffs into the lungs every 4 (four) hours as needed for wheezing or shortness of breath.    Marland Kitchen apixaban (ELIQUIS) 5 MG TABS tablet Take 1 tablet (5 mg total) by mouth 2 (two) times daily. 60 tablet 4  . fluticasone (FLOVENT HFA) 110 MCG/ACT inhaler Inhale 2 puffs into the lungs 2 (two) times daily as needed (asthma related  symptoms).     . polyethylene glycol (MIRALAX / GLYCOLAX) packet Take 17 g by mouth daily. (Patient not taking: Reported on 05/19/2018) 14 each 0  . senna-docusate (SENOKOT-S) 8.6-50 MG tablet Take 2 tablets by mouth at bedtime as needed for mild constipation. (Patient not taking: Reported on 04/28/2018) 60 tablet 0   No current facility-administered medications for this visit.     REVIEW OF SYSTEMS:    A 10+ POINT REVIEW OF SYSTEMS WAS OBTAINED including neurology, dermatology, psychiatry, cardiac, respiratory, lymph, extremities, GI, GU, Musculoskeletal, constitutional, breasts, reproductive, HEENT.  All pertinent positives are noted in the HPI.  All others are negative.    PHYSICAL EXAMINATION: ECOG PERFORMANCE STATUS: 1 - Symptomatic but completely ambulatory  . Vitals:   06/03/18 1124  BP: 117/77  Pulse: 95  Resp: 18  Temp: 98.7 F (37.1 C)  SpO2: 100%   Filed Weights   06/03/18 1124  Weight:  132 lb (59.9 kg)   .Body mass index is 21.31 kg/m.  GENERAL:alert, in no acute distress and comfortable SKIN: no acute rashes, no significant lesions EYES: conjunctiva are pink and non-injected, sclera anicteric OROPHARYNX: MMM, no exudates, no oropharyngeal erythema or ulceration NECK: supple, no JVD LYMPH:  no palpable lymphadenopathy in the cervical, axillary or inguinal regions LUNGS: clear to auscultation b/l with normal respiratory effort HEART: regular rate & rhythm ABDOMEN:  normoactive bowel sounds , non tender, not distended. No palpable hepatosplenomegaly.  Extremity: no pedal edema PSYCH: alert & oriented x 3 with fluent speech NEURO: no focal motor/sensory deficits    LABORATORY DATA:  I have reviewed the data as listed  . CBC Latest Ref Rng & Units 06/03/2018 05/23/2018 05/22/2018  WBC 4.0 - 10.5 K/uL 14.6(H) 5.3 7.7  Hemoglobin 13.0 - 17.0 g/dL 10.8(L) 10.5(L) 10.3(L)  Hematocrit 39.0 - 52.0 % 34.2(L) 33.3(L) 33.3(L)  Platelets 150 - 400 K/uL 230 474(H) 439(H)    . CMP Latest Ref Rng & Units 06/03/2018 05/23/2018 05/22/2018  Glucose 70 - 99 mg/dL 102(H) 98 103(H)  BUN 6 - 20 mg/dL 10 12 13   Creatinine 0.61 - 1.24 mg/dL 0.76 0.58(L) 0.61  Sodium 135 - 145 mmol/L 141 139 140  Potassium 3.5 - 5.1 mmol/L 4.1 3.6 3.6  Chloride 98 - 111 mmol/L 106 103 107  CO2 22 - 32 mmol/L 26 27 28   Calcium 8.9 - 10.3 mg/dL 9.3 9.4 9.2  Total Protein 6.5 - 8.1 g/dL 6.7 - -  Total Bilirubin 0.3 - 1.2 mg/dL 0.2(L) - -  Alkaline Phos 38 - 126 U/L 112 - -  AST 15 - 41 U/L 18 - -  ALT 0 - 44 U/L 43 - -   03/10/18 Biopsy:     03/10/18 Tissue Flow Cytometry:     RADIOGRAPHIC STUDIES: I have personally reviewed the radiological images as listed and agreed with the findings in the report. No results found.  ASSESSMENT & PLAN:  23 y.o. male with  #1Recently diagnosed Primary Mediastinal B cell Non Hodgkins lymphoma c-MYC negative Not a double hit lymphoma Pathology confirmed  by pathology read at [facility].  #2 s/p SVC compression due to mediastinal mass without overt SVC syndrome clinical symptoms #3 small acute distal left upper lobe pulmonary embolus-on lovenox #4 Acute Left IJ and Subclavian DVT due to left sided venous compression due to mass and due to malignancy -wason lovenox for malignancy related thrombosis-- now changed to Eliquis #4 left-sided pleural effusion and left-sided lung atelectasis due to airway  compression from mediastinal mass -resolved on CXR   PLAN: -Discussed pt labwork today, 06/03/18; blood counts and chemistries are holding -Proceed with PET/CT on 06/05/18 -Suspect dry skin near patient's armpits, and recommend moisturizing with lotion -The pt has no prohibitive toxicities from C4 EPOCH-R at this time. -Continue Eliquis 5mg  po BID  -Suspect anal fissure, recommend using Senna S to allow healing -Salt and baking soda mouthwashes 4-5 times per dayto minimize mucositis. -Recommended that the pt continue to eat well, drink at least 48-64 oz of water each day, and walk 20-30 minutes each day. -Will admit for C5 da EPOCH-R on 06/09/18   Plz schedule inpatient admission from 06/09/2018 for inpatient [facility] for 5 days Plz schedule outpatient neulasta and Rituxan for 06/16/2018 RTC with dr Irene Limbo with labs on 06/23/2018 Port flush appointment with all labs   All of the patients questions were answered with apparent satisfaction. The patient knows to call the clinic with any problems, questions or concerns.  The total time spent in the appt was 25 minutes and more than 50% was on counseling and direct patient cares.    Sullivan Lone MD MS AAHIVMS [facility] Hematology/Oncology Physician [facility]  (Office):       [phone] (Work cell):  [phone] (Fax):           [phone]  06/03/2018 11:55 AM  I, [place], am acting as a scribe for Dr. Sullivan Lone.   .I have reviewed the above documentation for  accuracy and completeness, and I agree with the above. Brunetta Genera MD

## 2018-06-03 ENCOUNTER — Inpatient Hospital Stay (HOSPITAL_BASED_OUTPATIENT_CLINIC_OR_DEPARTMENT_OTHER): Payer: BLUE CROSS/BLUE SHIELD | Admitting: Hematology

## 2018-06-03 ENCOUNTER — Inpatient Hospital Stay: Payer: BLUE CROSS/BLUE SHIELD

## 2018-06-03 ENCOUNTER — Telehealth: Payer: Self-pay | Admitting: Hematology

## 2018-06-03 ENCOUNTER — Telehealth: Payer: Self-pay | Admitting: *Deleted

## 2018-06-03 VITALS — BP 117/77 | HR 95 | Temp 98.7°F | Resp 18 | Ht 66.0 in | Wt 132.0 lb

## 2018-06-03 DIAGNOSIS — Z95828 Presence of other vascular implants and grafts: Secondary | ICD-10-CM

## 2018-06-03 DIAGNOSIS — I2699 Other pulmonary embolism without acute cor pulmonale: Secondary | ICD-10-CM

## 2018-06-03 DIAGNOSIS — I82402 Acute embolism and thrombosis of unspecified deep veins of left lower extremity: Secondary | ICD-10-CM

## 2018-06-03 DIAGNOSIS — C8528 Mediastinal (thymic) large B-cell lymphoma, lymph nodes of multiple sites: Secondary | ICD-10-CM

## 2018-06-03 DIAGNOSIS — K5900 Constipation, unspecified: Secondary | ICD-10-CM

## 2018-06-03 DIAGNOSIS — Z7901 Long term (current) use of anticoagulants: Secondary | ICD-10-CM

## 2018-06-03 LAB — CBC WITH DIFFERENTIAL/PLATELET
Abs Immature Granulocytes: 0.71 10*3/uL — ABNORMAL HIGH (ref 0.00–0.07)
Basophils Absolute: 0.1 10*3/uL (ref 0.0–0.1)
Basophils Relative: 0 %
Eosinophils Absolute: 0.2 10*3/uL (ref 0.0–0.5)
Eosinophils Relative: 2 %
HCT: 34.2 % — ABNORMAL LOW (ref 39.0–52.0)
Hemoglobin: 10.8 g/dL — ABNORMAL LOW (ref 13.0–17.0)
Immature Granulocytes: 5 %
LYMPHS ABS: 1.3 10*3/uL (ref 0.7–4.0)
Lymphocytes Relative: 9 %
MCH: 27.6 pg (ref 26.0–34.0)
MCHC: 31.6 g/dL (ref 30.0–36.0)
MCV: 87.2 fL (ref 80.0–100.0)
MONOS PCT: 10 %
Monocytes Absolute: 1.4 10*3/uL — ABNORMAL HIGH (ref 0.1–1.0)
Neutro Abs: 10.9 10*3/uL — ABNORMAL HIGH (ref 1.7–7.7)
Neutrophils Relative %: 74 %
Platelets: 230 10*3/uL (ref 150–400)
RBC: 3.92 MIL/uL — ABNORMAL LOW (ref 4.22–5.81)
RDW: 19.9 % — ABNORMAL HIGH (ref 11.5–15.5)
WBC: 14.6 10*3/uL — ABNORMAL HIGH (ref 4.0–10.5)
nRBC: 0.9 % — ABNORMAL HIGH (ref 0.0–0.2)

## 2018-06-03 LAB — CMP (CANCER CENTER ONLY)
ALT: 43 U/L (ref 0–44)
AST: 18 U/L (ref 15–41)
Albumin: 3.9 g/dL (ref 3.5–5.0)
Alkaline Phosphatase: 112 U/L (ref 38–126)
Anion gap: 9 (ref 5–15)
BUN: 10 mg/dL (ref 6–20)
CO2: 26 mmol/L (ref 22–32)
Calcium: 9.3 mg/dL (ref 8.9–10.3)
Chloride: 106 mmol/L (ref 98–111)
Creatinine: 0.76 mg/dL (ref 0.61–1.24)
GFR, Est AFR Am: >60 mL/min (ref >60)
GFR, Estimated: >60 mL/min (ref >60)
Glucose, Bld: 102 mg/dL — ABNORMAL HIGH (ref 70–99)
Potassium: 4.1 mmol/L (ref 3.5–5.1)
Sodium: 141 mmol/L (ref 135–145)
Total Bilirubin: 0.2 mg/dL — ABNORMAL LOW (ref 0.3–1.2)
Total Protein: 6.7 g/dL (ref 6.5–8.1)

## 2018-06-03 MED ORDER — HEPARIN SOD (PORK) LOCK FLUSH 100 UNIT/ML IV SOLN
500.0000 [IU] | Freq: Once | INTRAVENOUS | Status: AC | PRN
  Administered 2018-06-03: 500 [IU]
  Filled 2018-06-03: qty 5

## 2018-06-03 MED ORDER — SODIUM CHLORIDE 0.9% FLUSH
10.0000 mL | INTRAVENOUS | Status: DC | PRN
  Administered 2018-06-03: 10 mL
  Filled 2018-06-03: qty 10

## 2018-06-03 NOTE — Telephone Encounter (Signed)
Gave patient avs report and appointments for March. Left message for desk nurse re inpatient admission for week of 3/2 - desk nurse will arrange admission. Patient aware.

## 2018-06-03 NOTE — Telephone Encounter (Signed)
Per Dr. Irene Limbo, schedule for admission to [facility] for AM admit 3/2. Patient demographics given to Robin in bed placement. Sent email to [email or web address] and notified infusion pharmacy of pending admission.

## 2018-06-05 ENCOUNTER — Ambulatory Visit (HOSPITAL_COMMUNITY)
Admission: RE | Admit: 2018-06-05 | Discharge: 2018-06-05 | Disposition: A | Discharge status: Home / Self Care | Payer: BLUE CROSS/BLUE SHIELD | Source: Ambulatory Visit | Attending: Hematology | Admitting: Hematology

## 2018-06-05 DIAGNOSIS — C8528 Mediastinal (thymic) large B-cell lymphoma, lymph nodes of multiple sites: Secondary | ICD-10-CM | POA: Diagnosis present

## 2018-06-05 LAB — GLUCOSE, CAPILLARY: Glucose-Capillary: 93 mg/dL (ref 70–99)

## 2018-06-05 MED ORDER — FLUDEOXYGLUCOSE F - 18 (FDG) INJECTION
6.5000 | Freq: Once | INTRAVENOUS | Status: AC | PRN
  Administered 2018-06-05: 6.5 via INTRAVENOUS

## 2018-06-09 ENCOUNTER — Encounter (HOSPITAL_COMMUNITY): Payer: Self-pay

## 2018-06-09 ENCOUNTER — Inpatient Hospital Stay (HOSPITAL_COMMUNITY)
Admission: AD | Admit: 2018-06-09 | Discharge: 2018-06-13 | DRG: 847 | Disposition: A | Discharge status: Home / Self Care | Payer: BLUE CROSS/BLUE SHIELD | Attending: Hematology | Admitting: Hematology

## 2018-06-09 DIAGNOSIS — Z7189 Other specified counseling: Secondary | ICD-10-CM

## 2018-06-09 DIAGNOSIS — E876 Hypokalemia: Secondary | ICD-10-CM

## 2018-06-09 DIAGNOSIS — Z7901 Long term (current) use of anticoagulants: Secondary | ICD-10-CM

## 2018-06-09 DIAGNOSIS — Z95828 Presence of other vascular implants and grafts: Secondary | ICD-10-CM

## 2018-06-09 DIAGNOSIS — C8528 Mediastinal (thymic) large B-cell lymphoma, lymph nodes of multiple sites: Secondary | ICD-10-CM

## 2018-06-09 DIAGNOSIS — Z86711 Personal history of pulmonary embolism: Secondary | ICD-10-CM

## 2018-06-09 DIAGNOSIS — C8522 Mediastinal (thymic) large B-cell lymphoma, intrathoracic lymph nodes: Secondary | ICD-10-CM

## 2018-06-09 DIAGNOSIS — Z5111 Encounter for antineoplastic chemotherapy: Secondary | ICD-10-CM | POA: Diagnosis present

## 2018-06-09 LAB — COMPREHENSIVE METABOLIC PANEL
ALT: 24 U/L (ref 0–44)
AST: 20 U/L (ref 15–41)
Albumin: 4.3 g/dL (ref 3.5–5.0)
Alkaline Phosphatase: 77 U/L (ref 38–126)
Anion gap: 10 (ref 5–15)
BILIRUBIN TOTAL: 0.7 mg/dL (ref 0.3–1.2)
BUN: 11 mg/dL (ref 6–20)
CO2: 25 mmol/L (ref 22–32)
Calcium: 9.4 mg/dL (ref 8.9–10.3)
Chloride: 102 mmol/L (ref 98–111)
Creatinine, Ser: 0.76 mg/dL (ref 0.61–1.24)
GFR calc Af Amer: >60 mL/min (ref >60)
GFR calc non Af Amer: >60 mL/min (ref >60)
Glucose, Bld: 124 mg/dL — ABNORMAL HIGH (ref 70–99)
Potassium: 3.5 mmol/L (ref 3.5–5.1)
Sodium: 137 mmol/L (ref 135–145)
Total Protein: 6.8 g/dL (ref 6.5–8.1)

## 2018-06-09 LAB — CBC WITH DIFFERENTIAL/PLATELET
Abs Immature Granulocytes: 0.03 10*3/uL (ref 0.00–0.07)
Basophils Absolute: 0.1 10*3/uL (ref 0.0–0.1)
Basophils Relative: 1 %
Eosinophils Absolute: 0 10*3/uL (ref 0.0–0.5)
Eosinophils Relative: 0 %
HEMATOCRIT: 38.6 % — AB (ref 39.0–52.0)
Hemoglobin: 11.7 g/dL — ABNORMAL LOW (ref 13.0–17.0)
Immature Granulocytes: 0 %
LYMPHS ABS: 0.6 10*3/uL — AB (ref 0.7–4.0)
LYMPHS PCT: 6 %
MCH: 27 pg (ref 26.0–34.0)
MCHC: 30.3 g/dL (ref 30.0–36.0)
MCV: 88.9 fL (ref 80.0–100.0)
Monocytes Absolute: 0.6 10*3/uL (ref 0.1–1.0)
Monocytes Relative: 6 %
Neutro Abs: 7.6 10*3/uL (ref 1.7–7.7)
Neutrophils Relative %: 87 %
Platelets: 389 10*3/uL (ref 150–400)
RBC: 4.34 MIL/uL (ref 4.22–5.81)
RDW: 18.9 % — ABNORMAL HIGH (ref 11.5–15.5)
WBC: 8.8 10*3/uL (ref 4.0–10.5)
nRBC: 0 % (ref 0.0–0.2)

## 2018-06-09 LAB — MAGNESIUM: Magnesium: 2.4 mg/dL (ref 1.7–2.4)

## 2018-06-09 MED ORDER — VINCRISTINE SULFATE CHEMO INJECTION 1 MG/ML
Freq: Once | INTRAVENOUS | Status: AC
  Administered 2018-06-09: 16:00:00 via INTRAVENOUS
  Filled 2018-06-09: qty 9

## 2018-06-09 MED ORDER — HOT PACK MISC ONCOLOGY
1.0000 | Freq: Once | Status: DC | PRN
  Filled 2018-06-09: qty 1

## 2018-06-09 MED ORDER — SODIUM CHLORIDE 0.9 % IV SOLN
Freq: Once | INTRAVENOUS | Status: AC
  Administered 2018-06-09: 8 mg via INTRAVENOUS
  Filled 2018-06-09: qty 4

## 2018-06-09 MED ORDER — SENNOSIDES-DOCUSATE SODIUM 8.6-50 MG PO TABS: 2.0000 | ORAL_TABLET | Freq: Every evening | ORAL | Status: DC | PRN

## 2018-06-09 MED ORDER — SODIUM CHLORIDE 0.9 % IV SOLN
INTRAVENOUS | Status: DC
  Administered 2018-06-09: 15:00:00 via INTRAVENOUS

## 2018-06-09 MED ORDER — ACETAMINOPHEN 325 MG PO TABS: 650.0000 mg | ORAL_TABLET | ORAL | Status: DC | PRN

## 2018-06-09 MED ORDER — COLD PACK MISC ONCOLOGY
1.0000 | Freq: Once | Status: DC | PRN
  Filled 2018-06-09: qty 1

## 2018-06-09 MED ORDER — APIXABAN 5 MG PO TABS
5.0000 mg | ORAL_TABLET | Freq: Two times a day (BID) | ORAL | Status: DC
  Administered 2018-06-09 – 2018-06-13 (×8): 5 mg via ORAL
  Filled 2018-06-09 (×8): qty 1

## 2018-06-09 MED ORDER — PREDNISONE 20 MG PO TABS
60.0000 mg | ORAL_TABLET | Freq: Every day | ORAL | Status: AC
  Administered 2018-06-09 – 2018-06-13 (×5): 60 mg via ORAL
  Filled 2018-06-09 (×5): qty 3

## 2018-06-09 NOTE — Progress Notes (Signed)
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 male who has been admitted today for [place] of his Primary Mediastinal B Cell Non Hodgkin's Lymphoma. He reports that he is doing well overall.   The pt reports that he has not developed any concerns since our last visit in clinic. He endorses good energy levels, has been eating well, and has rested well. He denies any mouth sores or concerns for infections. He notes that he has gained weight in the interim as well.  Lab results today were discussed with him. PET/CT scan done on 06/05/2018 showed Further reduction in size of the mediastinal and paramediastinal mass. Currently there is residual Deauville 4 activity.  On review of systems, pt reports good energy levels, eating well, weight gain, resting well, and denies mouth sores, abdominal pains, concerns for infections, and any other symptoms.  On review of systems, he denies fever, chills, leg swelling, SOB, CP, and any other symptoms. Pertinent positives are listed and detailed within the above HPI.   MEDICAL HISTORY:  History reviewed. No pertinent past medical history.  SURGICAL HISTORY: Past Surgical History:  Procedure Laterality Date  . IR IMAGING GUIDED PORT INSERTION  04/22/2018  . MEDIASTINOSCOPY N/A 03/10/2018   Procedure: MEDIASTINOSCOPY;  Surgeon: Ivin Poot, MD;  Location: [facility];  Service: Thoracic;  Laterality: N/A;  . VIDEO ASSISTED THORACOSCOPY (VATS)/ LYMPH NODE SAMPLING Left 03/10/2018   Procedure: VIDEO ASSISTED THORACOSCOPY (VATS)/ BIOSPY ANTERIOR APPROACH;  Surgeon: Ivin Poot, MD;  Location: [place];  Service: Thoracic;  Laterality: Left;    Marland Kitchen VIDEO BRONCHOSCOPY Left 03/10/2018   Procedure: VIDEO BRONCHOSCOPY;  Surgeon: Ivin Poot, MD;  Location: [facility] OR;  Service: Thoracic;  Laterality: Left;    SOCIAL HISTORY: Social History   Socioeconomic History  . Marital status: Single    Spouse name: Not on file  . Number of children: Not on file  . Years of education: Not on file  . Highest education level: Not on file  Occupational History  . Not on file  Social Needs  . Financial resource strain: Not on file  . Food insecurity:    Worry: Not on file    Inability: Not on file  . Transportation needs:    Medical: Not on file    Non-medical: Not on file  Tobacco Use  . Smoking status: Never Smoker  . Smokeless tobacco: Never Used  Substance and Sexual Activity  . Alcohol use: Not Currently  . Drug use: Not Currently  . Sexual activity: Not on file  Lifestyle  . Physical activity:    Days per week: Not on file    Minutes per session: Not on file  . Stress: Not on file  Relationships  . Social connections:    Talks on phone: Not on file    Gets together: Not on file    Attends religious service: Not on file    Active member of club or organization: Not on file    Attends meetings of clubs or organizations: Not on file    Relationship status: Not on file  . Intimate partner violence:    Fear of current or ex partner: Not on  file    Emotionally abused: Not on file    Physically abused: Not on file    Forced sexual activity: Not on file  Other Topics Concern  . Not on file  Social History Narrative  . Not on file    FAMILY HISTORY: History reviewed. No pertinent family history.  ALLERGIES:  has No Known Allergies.  MEDICATIONS:  Current Facility-Administered Medications  Medication Dose Route Frequency Provider Last Rate Last Dose  . 0.9 %  sodium chloride infusion   Intravenous Continuous Brunetta Genera, MD      . acetaminophen (TYLENOL) tablet 650 mg  650 mg Oral Q4H PRN Brunetta Genera,  MD      . apixaban (ELIQUIS) tablet 5 mg  5 mg Oral BID Brunetta Genera, MD      . Cold Pack 1 packet  1 packet Topical Once PRN Brunetta Genera, MD      . DOXOrubicin (ADRIAMYCIN) 18 mg, etoposide (VEPESID) 94 mg, vinCRIStine (ONCOVIN) 0.6 mg in sodium chloride 0.9 % 1,000 mL chemo infusion   Intravenous Once Brunetta Genera, MD      . Hot Pack 1 packet  1 packet Topical Once PRN Brunetta Genera, MD      . ondansetron [facility]) 8 mg, dexamethasone (DECADRON) 10 mg in sodium chloride 0.9 % 50 mL IVPB   Intravenous Once Brunetta Genera, MD      . predniSONE (DELTASONE) tablet 60 mg  60 mg Oral QAC breakfast Brunetta Genera, MD      . senna-docusate (Senokot-S) tablet 2 tablet  2 tablet Oral QHS PRN Brunetta Genera, MD        REVIEW OF SYSTEMS:    A 10+ POINT REVIEW OF SYSTEMS WAS OBTAINED including neurology, dermatology, psychiatry, cardiac, respiratory, lymph, extremities, GI, GU, Musculoskeletal, constitutional, breasts, reproductive, HEENT.  All pertinent positives are noted in the HPI.  All others are negative.    PHYSICAL EXAMINATION:  ECOG PERFORMANCE STATUS: 1 - Symptomatic but completely ambulatory  . Vitals:   06/09/18 1006 06/09/18 1237  BP: 113/73 108/76  Pulse: (!) 107 86  Resp: 16 16  Temp: (!) 97.5 F (36.4 C) 97.6 F (36.4 C)  SpO2: 98% 99%   There were no vitals filed for this visit. .There is no height or weight on file to calculate BMI.  GENERAL:alert, in no acute distress and comfortable SKIN: no acute rashes, no significant lesions EYES: conjunctiva are pink and non-injected, sclera anicteric OROPHARYNX: MMM, no exudates, no oropharyngeal erythema or ulceration NECK: supple, no JVD LYMPH:  no palpable lymphadenopathy in the cervical, axillary or inguinal regions LUNGS: clear to auscultation b/l with normal respiratory effort HEART: regular rate & rhythm ABDOMEN:  normoactive bowel sounds , non tender, not distended. No  palpable hepatosplenomegaly.  Extremity: no pedal edema PSYCH: alert & oriented x 3 with fluent speech NEURO: no focal motor/sensory deficits    LABORATORY DATA:  I have reviewed the data as listed  . CBC Latest Ref Rng & Units 06/09/2018 06/03/2018 05/23/2018  WBC 4.0 - 10.5 K/uL 8.8 14.6(H) 5.3  Hemoglobin 13.0 - 17.0 g/dL 11.7(L) 10.8(L) 10.5(L)  Hematocrit 39.0 - 52.0 % 38.6(L) 34.2(L) 33.3(L)  Platelets 150 - 400 K/uL 389 230 474(H)    . CMP Latest Ref Rng & Units 06/09/2018 06/03/2018 05/23/2018  Glucose 70 - 99 mg/dL 124(H) 102(H) 98  BUN 6 - 20 mg/dL 11 10 12   Creatinine 0.61 - 1.24 mg/dL 0.76 0.76  0.58(L)  Sodium 135 - 145 mmol/L 137 141 139  Potassium 3.5 - 5.1 mmol/L 3.5 4.1 3.6  Chloride 98 - 111 mmol/L 102 106 103  CO2 22 - 32 mmol/L 25 26 27   Calcium 8.9 - 10.3 mg/dL 9.4 9.3 9.4  Total Protein 6.5 - 8.1 g/dL 6.8 6.7 -  Total Bilirubin 0.3 - 1.2 mg/dL 0.7 0.2(L) -  Alkaline Phos 38 - 126 U/L 77 112 -  AST 15 - 41 U/L 20 18 -  ALT 0 - 44 U/L 24 43 -   03/10/18 Biopsy:     03/10/18 Tissue Flow Cytometry:     RADIOGRAPHIC STUDIES: I have personally reviewed the radiological images as listed and agreed with the findings in the report. 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 EXAM: NUCLEAR MEDICINE PET SKULL BASE TO THIGH TECHNIQUE: 6.5 mCi F-18 FDG was injected intravenously. Full-ring PET imaging was performed from the skull base to thigh after the radiotracer. CT data was obtained and used for attenuation correction and anatomic localization. Fasting blood glucose: 93 mg/dl COMPARISON:  Multiple exams, including 04/03/2018 FINDINGS: Mediastinal blood pool activity: SUV max 1.6 Background hepatic activity SUV max: 2.6 NECK: No significant abnormal hypermetabolic activity in this region. 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  CHEST: Primarily cystic and part solid mediastinal mass with some irregular calcifications noted, extending into both the left chest in the right chest in the paramediastinal regions as on the prior exam, region of involvement measuring approximately 15.6 cm transverse, previously 17.3 cm, and generally with moderate reduction in overall volume. Focal activity along the mediastinal component of the mass has appreciably reduced, with a maximum SUV of 2.8 and previously 4.4. This continues to represent Deauville 4 activity although has clearly improved. The hemithoracic component of the mass measures 2.7 by 3.7 and formerly 4.6 by 3.3 cm, maximum SUV 2.6 today and previously 5.2. Incidental CT findings: Right Port-A-Cath tip: Cavoatrial junction. ABDOMEN/PELVIS: No significant abnormal hypermetabolic activity in this region. Incidental CT findings: none SKELETON: Diffuse accentuated marrow activity without focal involvement. Incidental CT findings: none IMPRESSION: 1. Further reduction in size of the mediastinal and paramediastinal mass. Currently there is residual Deauville 4 activity. 2. Diffuse accentuated marrow activity without focal involvement. This could well be from granulocyte stimulation. 3. 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 Electronically Signed   By: Van Clines M.D.   On: 06/05/2018 11:45    ASSESSMENT & PLAN:  23 y.o. male with  #1Recently diagnosed Primary Mediastinal B cell Non Hodgkins lymphoma c-MYC negative Not a double hit lymphoma Pathology confirmed by pathology read at [facility]. PET/CT on 06/05/2018 showed: Further reduction in size of the mediastinal and paramediastinal mass. Currently there is residual Deauville 4 activity. Diffuse accentuated marrow activity without focal involvement. This could well be from granulocyte stimulation. Continued hypo activity in the left vocal cord probably from left vocal cord  paralysis.  #2 s/p SVC compression due to mediastinal mass without overt SVC syndrome clinical symptoms #3 small acute distal left upper lobe pulmonary embolus-on lovenox #4 Acute Left IJ and Subclavian DVT due to left sided venous compression due to mass and due to malignancy -wason lovenox for malignancy related thrombosis-- now changed to Eliquis #4 left-sided pleural effusion and left-sided lung atelectasis due to airway compression from mediastinal mass -resolved  on CXR   PLAN: -Discussed pt labs today, 06/09/2018  (06/09/18) of CBC w/diff, CMP, magnesium, BMP is as follows: CBC showed: Hgb at 11.7, PLT at 389k. CMP WNL. Magnesium WNL.  -Discussed with the patient regarding recent PET/CT on 06/05/2018 that showed: Further reduction in size of the mediastinal and paramediastinal mass. Currently there is residual Deauville 4 activity. Diffuse accentuated marrow activity without focal involvement. This could well be from granulocyte stimulation. 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 -The pt has no prohibitive toxicities from proceeding with C5 EPOCH-R at this time.  -Discussed with the patient that we will obtain a PET/CT scan at the end of his chemo treatments, and that that PET/CT scan will inform us if there is a role for radiation versus surveillance.  -Continue Eliquis 5mg  po BID  -Bowel prophylaxis  -Salt and baking soda mouthwashes 4-5 times per dayto minimize mucositis. -Recommended that the pt continue to eat well, drink at least 48-64 oz of water each day, and walk 20-30 minutes each day.   Plz schedule outpatient neulasta and Rituxan for 06/16/2018 RTC with dr Irene Limbo with labs on 06/23/2018 Port flush appointment with all labs   All of the patients questions were answered with apparent satisfaction. The patient knows to call the clinic with any problems, questions or concerns.    Sullivan Lone MD MS AAHIVMS [facility]  [facility] Hematology/Oncology Physician [facility]  (Office):       [phone] (Work cell):  [phone] (Fax):           [phone]  06/09/2018 1:07 PM  I, Soijett Blue am acting as scribe for Dr. Sullivan Lone.  .I have reviewed the above documentation for accuracy and completeness, and I agree with the above. Sullivan Lone MD MS   .

## 2018-06-09 NOTE — Progress Notes (Signed)
Spoke w/ Dr. Irene Limbo, plan for cycle 5 of da-EPOCH-R is to keep patient at second dose level. New labs will be drawn today but okay to start treatment today as labs have been stable.  Demetrius Charity, PharmD, [place] Oncology Pharmacist Pharmacy Phone: [phone] 06/09/2018

## 2018-06-09 NOTE — H&P (Signed)
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 male who has been admitted today for [place] of his Primary Mediastinal B Cell Non Hodgkin's Lymphoma. He reports that he is doing well overall.   The pt reports that he has not developed any concerns since our last visit in clinic. He endorses good energy levels, has been eating well, and has rested well. He denies any mouth sores or concerns for infections. He notes that he has gained weight in the interim as well.  Lab results today were discussed with him. PET/CT scan done on 06/05/2018 showed Further reduction in size of the mediastinal and paramediastinal mass. Currently there is residual Deauville 4 activity.  On review of systems, pt reports good energy levels, eating well, weight gain, resting well, and denies mouth sores, abdominal pains, concerns for infections, and any other symptoms.  On review of systems, he denies fever, chills, leg swelling, SOB, CP, and any other symptoms. Pertinent positives are listed and detailed within the above HPI.   MEDICAL HISTORY:  History reviewed. No pertinent past medical history.  SURGICAL HISTORY: Past Surgical History:  Procedure Laterality Date  . IR IMAGING GUIDED PORT INSERTION  04/22/2018  . MEDIASTINOSCOPY N/A 03/10/2018   Procedure: MEDIASTINOSCOPY;  Surgeon: Ivin Poot, MD;  Location: [place];  Service: Thoracic;  Laterality: N/A;  . VIDEO ASSISTED THORACOSCOPY (VATS)/ LYMPH NODE SAMPLING Left 03/10/2018   Procedure: VIDEO ASSISTED THORACOSCOPY (VATS)/ BIOSPY ANTERIOR APPROACH;  Surgeon: Ivin Poot, MD;  Location: [place];  Service: Thoracic;  Laterality: Left;    Marland Kitchen VIDEO BRONCHOSCOPY Left 03/10/2018   Procedure: VIDEO BRONCHOSCOPY;  Surgeon: Ivin Poot, MD;  Location: [facility] OR;  Service: Thoracic;  Laterality: Left;    SOCIAL HISTORY: Social History   Socioeconomic History  . Marital status: Single    Spouse name: Not on file  . Number of children: Not on file  . Years of education: Not on file  . Highest education level: Not on file  Occupational History  . Not on file  Social Needs  . Financial resource strain: Not on file  . Food insecurity:    Worry: Not on file    Inability: Not on file  . Transportation needs:    Medical: Not on file    Non-medical: Not on file  Tobacco Use  . Smoking status: Never Smoker  . Smokeless tobacco: Never Used  Substance and Sexual Activity  . Alcohol use: Not Currently  . Drug use: Not Currently  . Sexual activity: Not on file  Lifestyle  . Physical activity:    Days per week: Not on file    Minutes per session: Not on file  . Stress: Not on file  Relationships  . Social connections:    Talks on phone: Not on file    Gets together: Not on file    Attends religious service: Not on file    Active member of club or organization: Not on file    Attends meetings of clubs or organizations: Not on file    Relationship status: Not on file  . Intimate partner violence:    Fear of current or ex partner: Not on  file    Emotionally abused: Not on file    Physically abused: Not on file    Forced sexual activity: Not on file  Other Topics Concern  . Not on file  Social History Narrative  . Not on file    FAMILY HISTORY: History reviewed. No pertinent family history.  ALLERGIES:  has No Known Allergies.  MEDICATIONS:  Current Facility-Administered Medications  Medication Dose Route Frequency Provider Last Rate Last Dose  . 0.9 %  sodium chloride infusion   Intravenous Continuous Brunetta Genera, MD 10 mL/hr at 06/09/18 1551    . acetaminophen (TYLENOL) tablet 650 mg  650 mg Oral Q4H  PRN Brunetta Genera, MD      . apixaban Arne Cleveland) tablet 5 mg  5 mg Oral BID Brunetta Genera, MD      . Cold Pack 1 packet  1 packet Topical Once PRN Brunetta Genera, MD      . DOXOrubicin (ADRIAMYCIN) 18 mg, etoposide (VEPESID) 94 mg, vinCRIStine (ONCOVIN) 0.6 mg in sodium chloride 0.9 % 1,000 mL chemo infusion   Intravenous Once Brunetta Genera, MD 51 mL/hr at 06/09/18 1547    . Hot Pack 1 packet  1 packet Topical Once PRN Brunetta Genera, MD      . predniSONE (DELTASONE) tablet 60 mg  60 mg Oral QAC breakfast Brunetta Genera, MD   60 mg at 06/09/18 1510  . senna-docusate (Senokot-S) tablet 2 tablet  2 tablet Oral QHS PRN Brunetta Genera, MD        REVIEW OF SYSTEMS:    A 10+ POINT REVIEW OF SYSTEMS WAS OBTAINED including neurology, dermatology, psychiatry, cardiac, respiratory, lymph, extremities, GI, GU, Musculoskeletal, constitutional, breasts, reproductive, HEENT.  All pertinent positives are noted in the HPI.  All others are negative.    PHYSICAL EXAMINATION:  ECOG PERFORMANCE STATUS: 1 - Symptomatic but completely ambulatory  . Vitals:   06/09/18 1006 06/09/18 1237  BP: 113/73 108/76  Pulse: (!) 107 86  Resp: 16 16  Temp: (!) 97.5 F (36.4 C) 97.6 F (36.4 C)  SpO2: 98% 99%   There were no vitals filed for this visit. .There is no height or weight on file to calculate BMI.  GENERAL:alert, in no acute distress and comfortable SKIN: no acute rashes, no significant lesions EYES: conjunctiva are pink and non-injected, sclera anicteric OROPHARYNX: MMM, no exudates, no oropharyngeal erythema or ulceration NECK: supple, no JVD LYMPH:  no palpable lymphadenopathy in the cervical, axillary or inguinal regions LUNGS: clear to auscultation b/l with normal respiratory effort HEART: regular rate & rhythm ABDOMEN:  normoactive bowel sounds , non tender, not distended. No palpable hepatosplenomegaly.  Extremity: no pedal edema PSYCH: alert &  oriented x 3 with fluent speech NEURO: no focal motor/sensory deficits    LABORATORY DATA:  I have reviewed the data as listed  . CBC Latest Ref Rng & Units 06/09/2018 06/03/2018 05/23/2018  WBC 4.0 - 10.5 K/uL 8.8 14.6(H) 5.3  Hemoglobin 13.0 - 17.0 g/dL 11.7(L) 10.8(L) 10.5(L)  Hematocrit 39.0 - 52.0 % 38.6(L) 34.2(L) 33.3(L)  Platelets 150 - 400 K/uL 389 230 474(H)    . CMP Latest Ref Rng & Units 06/09/2018 06/03/2018 05/23/2018  Glucose 70 - 99 mg/dL 124(H) 102(H) 98  BUN 6 - 20 mg/dL 11 10 12   Creatinine 0.61 - 1.24 mg/dL 0.76 0.76 0.58(L)  Sodium 135 - 145 mmol/L 137 141 139  Potassium 3.5 - 5.1 mmol/L 3.5 4.1 3.6  Chloride  98 - 111 mmol/L 102 106 103  CO2 22 - 32 mmol/L 25 26 27   Calcium 8.9 - 10.3 mg/dL 9.4 9.3 9.4  Total Protein 6.5 - 8.1 g/dL 6.8 6.7 -  Total Bilirubin 0.3 - 1.2 mg/dL 0.7 0.2(L) -  Alkaline Phos 38 - 126 U/L 77 112 -  AST 15 - 41 U/L 20 18 -  ALT 0 - 44 U/L 24 43 -   03/10/18 Biopsy:     03/10/18 Tissue Flow Cytometry:     RADIOGRAPHIC STUDIES: I have personally reviewed the radiological images as listed and agreed with the findings in the report. 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 EXAM: NUCLEAR MEDICINE PET SKULL BASE TO THIGH TECHNIQUE: 6.5 mCi F-18 FDG was injected intravenously. Full-ring PET imaging was performed from the skull base to thigh after the radiotracer. CT data was obtained and used for attenuation correction and anatomic localization. Fasting blood glucose: 93 mg/dl COMPARISON:  Multiple exams, including 04/03/2018 FINDINGS: Mediastinal blood pool activity: SUV max 1.6 Background hepatic activity SUV max: 2.6 NECK: No significant abnormal hypermetabolic activity in this region. 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 CHEST: Primarily cystic and part solid mediastinal mass with some irregular  calcifications noted, extending into both the left chest in the right chest in the paramediastinal regions as on the prior exam, region of involvement measuring approximately 15.6 cm transverse, previously 17.3 cm, and generally with moderate reduction in overall volume. Focal activity along the mediastinal component of the mass has appreciably reduced, with a maximum SUV of 2.8 and previously 4.4. This continues to represent Deauville 4 activity although has clearly improved. The hemithoracic component of the mass measures 2.7 by 3.7 and formerly 4.6 by 3.3 cm, maximum SUV 2.6 today and previously 5.2. Incidental CT findings: Right Port-A-Cath tip: Cavoatrial junction. ABDOMEN/PELVIS: No significant abnormal hypermetabolic activity in this region. Incidental CT findings: none SKELETON: Diffuse accentuated marrow activity without focal involvement. Incidental CT findings: none IMPRESSION: 1. Further reduction in size of the mediastinal and paramediastinal mass. Currently there is residual Deauville 4 activity. 2. Diffuse accentuated marrow activity without focal involvement. This could well be from granulocyte stimulation. 3. 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 Electronically Signed   By: Van Clines M.D.   On: 06/05/2018 11:45    ASSESSMENT & PLAN:  23 y.o. male with  #1Recently diagnosed Primary Mediastinal B cell Non Hodgkins lymphoma c-MYC negative Not a double hit lymphoma Pathology confirmed by pathology read at [facility]. PET/CT on 06/05/2018 showed: Further reduction in size of the mediastinal and paramediastinal mass. Currently there is residual Deauville 4 activity. Diffuse accentuated marrow activity without focal involvement. This could well be from granulocyte stimulation. 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  #2 s/p SVC compression due to mediastinal mass without overt  SVC syndrome clinical symptoms #3 small acute distal left upper lobe pulmonary embolus-on lovenox #4 Acute Left IJ and Subclavian DVT due to left sided venous compression due to mass and due to malignancy -wason lovenox for malignancy related thrombosis-- now changed to Eliquis #4 left-sided pleural effusion and left-sided lung atelectasis due to airway compression from mediastinal mass -resolved on CXR   PLAN: -Discussed pt labs today, 06/09/2018  (06/09/18) of CBC w/diff, CMP, magnesium, BMP is as follows:  CBC showed: Hgb at 11.7, PLT at 389k. CMP WNL. Magnesium WNL.  -Discussed with the patient regarding recent PET/CT on 06/05/2018 that showed: Further reduction in size of the mediastinal and paramediastinal mass. Currently there is residual Deauville 4 activity. Diffuse accentuated marrow activity without focal involvement. This could well be from granulocyte stimulation. 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 -The pt has no prohibitive toxicities from proceeding with C5 EPOCH-R at this time.  -Discussed with the patient that we will obtain a PET/CT scan at the end of his chemo treatments, and that that PET/CT scan will inform us if there is a role for radiation versus surveillance.  -Continue Eliquis 5mg  po BID  -Bowel prophylaxis  -Salt and baking soda mouthwashes 4-5 times per dayto minimize mucositis. -Recommended that the pt continue to eat well, drink at least 48-64 oz of water each day, and walk 20-30 minutes each day.   Plz schedule outpatient neulasta and Rituxan for 06/16/2018 RTC with dr Irene Limbo with labs on 06/23/2018 Port flush appointment with all labs   All of the patients questions were answered with apparent satisfaction. The patient knows to call the clinic with any problems, questions or concerns.    Sullivan Lone MD MS AAHIVMS [facility] Hematology/Oncology Physician [facility]  (Office):        [phone] (Work cell):  [phone] (Fax):           [phone]  06/09/2018 5:01 PM  I, Soijett Blue am acting as scribe for Dr. Sullivan Lone.  .I have reviewed the above documentation for accuracy and completeness, and I agree with the above. Sullivan Lone MD MS   .

## 2018-06-09 NOTE — Progress Notes (Signed)
Chemotherapy dosage verified with Lottie Dawson, RN.

## 2018-06-10 ENCOUNTER — Other Ambulatory Visit: Payer: Self-pay

## 2018-06-10 LAB — CBC
HCT: 35 % — ABNORMAL LOW (ref 39.0–52.0)
HEMOGLOBIN: 10.9 g/dL — AB (ref 13.0–17.0)
MCH: 27.7 pg (ref 26.0–34.0)
MCHC: 31.1 g/dL (ref 30.0–36.0)
MCV: 89.1 fL (ref 80.0–100.0)
Platelets: 385 10*3/uL (ref 150–400)
RBC: 3.93 MIL/uL — AB (ref 4.22–5.81)
RDW: 19 % — ABNORMAL HIGH (ref 11.5–15.5)
WBC: 10.6 10*3/uL — ABNORMAL HIGH (ref 4.0–10.5)
nRBC: 0 % (ref 0.0–0.2)

## 2018-06-10 LAB — BASIC METABOLIC PANEL
ANION GAP: 7 (ref 5–15)
BUN: 10 mg/dL (ref 6–20)
CO2: 24 mmol/L (ref 22–32)
Calcium: 8.9 mg/dL (ref 8.9–10.3)
Chloride: 108 mmol/L (ref 98–111)
Creatinine, Ser: 0.72 mg/dL (ref 0.61–1.24)
GFR calc Af Amer: >60 mL/min (ref >60)
GFR calc non Af Amer: >60 mL/min (ref >60)
GLUCOSE: 121 mg/dL — AB (ref 70–99)
Potassium: 3.8 mmol/L (ref 3.5–5.1)
Sodium: 139 mmol/L (ref 135–145)

## 2018-06-10 MED ORDER — VINCRISTINE SULFATE CHEMO INJECTION 1 MG/ML
Freq: Once | INTRAVENOUS | Status: AC
  Administered 2018-06-10: 13:00:00 via INTRAVENOUS
  Filled 2018-06-10: qty 9

## 2018-06-10 MED ORDER — SODIUM CHLORIDE 0.9 % IV SOLN
Freq: Once | INTRAVENOUS | Status: AC
  Administered 2018-06-10: 18 mg via INTRAVENOUS
  Filled 2018-06-10: qty 4

## 2018-06-10 NOTE — Progress Notes (Signed)
HEMATOLOGY/ONCOLOGY INPATIENT PROGRESS NOTE  Date of Service: 06/10/2018  Inpatient Attending: .Brunetta Genera, MD   SUBJECTIVE:   Nicholas Moreno is accompanied today by his sister at bedside. The pt reports that he is doing well overall.   The pt reports that he has not developed any new concerns since yesterday. He has continued eating well and endorses stable energy levels. He has been sleeping well and has been moving his bowels well. His weight has been stable.  Lab results today (06/10/18) of CBC and BMP is as follows: all values are WNL except for WBC at 10.6k, RBC at 3.93, HGB at 10.9, HCT at 35.0, RDW at 19.0, Glucose at 121.  On review of systems, pt reports stable energy levels, eating well, moving his bowels well, sleeping well, stable weight, and denies constipation, abdominal pains, leg swelling, and any other symptoms.   OBJECTIVE:  NAD  PHYSICAL EXAMINATION: . Vitals:   06/10/18 0558 06/10/18 1124 06/10/18 1130 06/10/18 1334  BP: (!) 104/55   106/62  Pulse: 75   79  Resp: 16   16  Temp: 98.4 F (36.9 C)   98.4 F (36.9 C)  TempSrc: Oral   Oral  SpO2: 98%   98%  Weight:   126 lb 12.8 oz (57.5 kg)   Height:  5\' 6"  (1.676 m)     Filed Weights   06/10/18 1130  Weight: 126 lb 12.8 oz (57.5 kg)   .Body mass index is 20.47 kg/m.  GENERAL:alert, in no acute distress and comfortable SKIN: no acute rashes, no significant lesions EYES: conjunctiva are pink and non-injected, sclera anicteric OROPHARYNX: MMM, no exudates, no oropharyngeal erythema or ulceration NECK: supple, no JVD LYMPH:  no palpable lymphadenopathy in the cervical, axillary or inguinal regions LUNGS: clear to auscultation b/l with normal respiratory effort HEART: regular rate & rhythm ABDOMEN:  normoactive bowel sounds , non tender, not distended. No palpable hepatosplenomegaly.  Extremity: no pedal edema PSYCH: alert & oriented x 3 with fluent speech NEURO: no focal motor/sensory  deficits   MEDICAL HISTORY:  History reviewed. No pertinent past medical history.  SURGICAL HISTORY: Past Surgical History:  Procedure Laterality Date  . IR IMAGING GUIDED PORT INSERTION  04/22/2018  . MEDIASTINOSCOPY N/A 03/10/2018   Procedure: MEDIASTINOSCOPY;  Surgeon: Ivin Poot, MD;  Location: [place];  Service: Thoracic;  Laterality: N/A;  . VIDEO ASSISTED THORACOSCOPY (VATS)/ LYMPH NODE SAMPLING Left 03/10/2018   Procedure: VIDEO ASSISTED THORACOSCOPY (VATS)/ BIOSPY ANTERIOR APPROACH;  Surgeon: Ivin Poot, MD;  Location: [place];  Service: Thoracic;  Laterality: Left;  Marland Kitchen VIDEO BRONCHOSCOPY Left 03/10/2018   Procedure: VIDEO BRONCHOSCOPY;  Surgeon: Ivin Poot, MD;  Location: [facility] OR;  Service: Thoracic;  Laterality: Left;    SOCIAL HISTORY: Social History   Socioeconomic History  . Marital status: Single    Spouse name: Not on file  . Number of children: Not on file  . Years of education: Not on file  . Highest education level: Not on file  Occupational History  . Not on file  Social Needs  . Financial resource strain: Not on file  . Food insecurity:    Worry: Not on file    Inability: Not on file  . Transportation needs:    Medical: Not on file    Non-medical: Not on file  Tobacco Use  . Smoking status: Never Smoker  . Smokeless tobacco: Never Used  Substance and Sexual Activity  . Alcohol use: Not  Currently  . Drug use: Not Currently  . Sexual activity: Not on file  Lifestyle  . Physical activity:    Days per week: Not on file    Minutes per session: Not on file  . Stress: Not on file  Relationships  . Social connections:    Talks on phone: Not on file    Gets together: Not on file    Attends religious service: Not on file    Active member of club or organization: Not on file    Attends meetings of clubs or organizations: Not on file    Relationship status: Not on file  . Intimate partner violence:    Fear of current or ex partner: Not on file      Emotionally abused: Not on file    Physically abused: Not on file    Forced sexual activity: Not on file  Other Topics Concern  . Not on file  Social History Narrative  . Not on file    FAMILY HISTORY: History reviewed. No pertinent family history.  ALLERGIES:  has No Known Allergies.  MEDICATIONS:  Scheduled Meds: . apixaban  5 mg Oral BID  . 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 predniSONE  60 mg Oral QAC breakfast   Continuous Infusions: . sodium chloride 10 mL/hr at 06/10/18 1504   PRN Meds:.acetaminophen, Cold Pack, Hot Pack, senna-docusate  REVIEW OF SYSTEMS:    A 10+ POINT REVIEW OF SYSTEMS WAS OBTAINED including neurology, dermatology, psychiatry, cardiac, respiratory, lymph, extremities, GI, GU, Musculoskeletal, constitutional, breasts, reproductive, HEENT.  All pertinent positives are noted in the HPI.  All others are negative.   LABORATORY DATA:  I have reviewed the data as listed  . CBC Latest Ref Rng & Units 06/10/2018 06/09/2018 06/03/2018  WBC 4.0 - 10.5 K/uL 10.6(H) 8.8 14.6(H)  Hemoglobin 13.0 - 17.0 g/dL 10.9(L) 11.7(L) 10.8(L)  Hematocrit 39.0 - 52.0 % 35.0(L) 38.6(L) 34.2(L)  Platelets 150 - 400 K/uL 385 389 230    . CMP Latest Ref Rng & Units 06/10/2018 06/09/2018 06/03/2018  Glucose 70 - 99 mg/dL 121(H) 124(H) 102(H)  BUN 6 - 20 mg/dL 10 11 10   Creatinine 0.61 - 1.24 mg/dL 0.72 0.76 0.76  Sodium 135 - 145 mmol/L 139 137 141  Potassium 3.5 - 5.1 mmol/L 3.8 3.5 4.1  Chloride 98 - 111 mmol/L 108 102 106  CO2 22 - 32 mmol/L 24 25 26   Calcium 8.9 - 10.3 mg/dL 8.9 9.4 9.3  Total Protein 6.5 - 8.1 g/dL - 6.8 6.7  Total Bilirubin 0.3 - 1.2 mg/dL - 0.7 0.2(L)  Alkaline Phos 38 - 126 U/L - 77 112  AST 15 - 41 U/L - 20 18  ALT 0 - 44 U/L - 24 43     RADIOGRAPHIC STUDIES: I have personally reviewed the radiological images as listed and agreed with the findings in the report. 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 EXAM: NUCLEAR MEDICINE PET SKULL BASE TO THIGH TECHNIQUE: 6.5 mCi F-18 FDG was injected intravenously. Full-ring PET imaging was performed from the skull base to thigh after the radiotracer. CT data was obtained and used for attenuation correction and anatomic localization. Fasting blood glucose: 93 mg/dl COMPARISON:  Multiple exams, including 04/03/2018 FINDINGS: Mediastinal blood pool activity: SUV max 1.6 Background hepatic activity SUV max: 2.6 NECK: No significant abnormal hypermetabolic activity in  this region. 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 CHEST: Primarily cystic and part solid mediastinal mass with some irregular calcifications noted, extending into both the left chest in the right chest in the paramediastinal regions as on the prior exam, region of involvement measuring approximately 15.6 cm transverse, previously 17.3 cm, and generally with moderate reduction in overall volume. Focal activity along the mediastinal component of the mass has appreciably reduced, with a maximum SUV of 2.8 and previously 4.4. This continues to represent Deauville 4 activity although has clearly improved. The hemithoracic component of the mass measures 2.7 by 3.7 and formerly 4.6 by 3.3 cm, maximum SUV 2.6 today and previously 5.2. Incidental CT findings: Right Port-A-Cath tip: Cavoatrial junction. ABDOMEN/PELVIS: No significant abnormal hypermetabolic activity in this region. Incidental CT findings: none SKELETON: Diffuse accentuated marrow activity without focal involvement. Incidental CT findings: none IMPRESSION: 1. Further reduction in size of the mediastinal and paramediastinal mass. Currently there is residual Deauville 4 activity. 2. Diffuse accentuated marrow activity without focal involvement. This could well be from granulocyte stimulation. 3.  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 Electronically Signed   By: Van Clines M.D.   On: 06/05/2018 11:45    ASSESSMENT & PLAN:  23 y.o. 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 PET/CT on 06/05/2018 showed: Further reduction in size of the mediastinal and paramediastinal mass. Currently there is residual Deauville 4 activity. Diffuse accentuated marrow activity without focal involvement. This could well be from granulocyte stimulation. 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  #2 s/p SVC compression due to mediastinal mass without overt SVC syndrome clinical symptoms #3 small acute distal left upper lobe pulmonary embolus-on lovenox #4 Acute Left IJ and Subclavian DVT due to left sided venous compression due to mass and due to malignancy -wason lovenox for malignancy related thrombosis-- now changed to Eliquis #4 left-sided pleural effusion and left-sided lung atelectasis due to airway compression from mediastinal mass -resolved on CXR   PLAN: -Discussed pt labwork today, 06/10/18; blood counts and chemistries stable -The pt has no prohibitive toxicities from continuing C5D2 dose escalated EPOCH-R at this time.   -His case was discussed in tumor board today and he was noted to have significant response to treatment with residual Deauville 3 disease. -Continue eating well, staying hydrated, and staying as active as reasonably possible  -Discussed with the patient that we will obtain a PET/CT scan at the end of his chemo treatments, and that that PET/CT scan will inform us if there is a role for radiation versus surveillance.  -Continue Eliquis 5mg  po BID  -Bowel prophylaxis  -Salt and baking soda mouthwashes 4-5 times per dayto minimize  mucositis. -Outpatient neulasta and Rituxan on 06/16/2018 -RTC with dr Irene Limbo with labs on 06/23/2018   The total time spent in the appt was 25 minutes and more than 50% was on counseling and direct patient cares.    Sullivan Lone MD MS AAHIVMS [facility] Hematology/Oncology Physician [facility]  (Office):       [phone] (Work cell):  [phone] (Fax):           [phone]  06/10/2018 4:04 PM   I, Baldwin Jamaica, am acting as a scribe for Dr. Sullivan Lone.   .I have reviewed the  above documentation for accuracy and completeness, and I agree with the above. Sullivan Lone MD MS

## 2018-06-11 LAB — BASIC METABOLIC PANEL
Anion gap: 6 (ref 5–15)
BUN: 10 mg/dL (ref 6–20)
CO2: 26 mmol/L (ref 22–32)
Calcium: 8.8 mg/dL — ABNORMAL LOW (ref 8.9–10.3)
Chloride: 108 mmol/L (ref 98–111)
Creatinine, Ser: 0.81 mg/dL (ref 0.61–1.24)
GFR calc non Af Amer: >60 mL/min (ref >60)
Glucose, Bld: 98 mg/dL (ref 70–99)
Potassium: 3.5 mmol/L (ref 3.5–5.1)
Sodium: 140 mmol/L (ref 135–145)

## 2018-06-11 LAB — CBC
HCT: 34.4 % — ABNORMAL LOW (ref 39.0–52.0)
Hemoglobin: 10.6 g/dL — ABNORMAL LOW (ref 13.0–17.0)
MCH: 27.9 pg (ref 26.0–34.0)
MCHC: 30.8 g/dL (ref 30.0–36.0)
MCV: 90.5 fL (ref 80.0–100.0)
NRBC: 0 % (ref 0.0–0.2)
Platelets: 409 10*3/uL — ABNORMAL HIGH (ref 150–400)
RBC: 3.8 MIL/uL — ABNORMAL LOW (ref 4.22–5.81)
RDW: 19.2 % — ABNORMAL HIGH (ref 11.5–15.5)
WBC: 7.8 10*3/uL (ref 4.0–10.5)

## 2018-06-11 MED ORDER — SODIUM CHLORIDE 0.9 % IV SOLN
Freq: Once | INTRAVENOUS | Status: AC
  Administered 2018-06-11: 18 mg via INTRAVENOUS
  Filled 2018-06-11: qty 4

## 2018-06-11 MED ORDER — VINCRISTINE SULFATE CHEMO INJECTION 1 MG/ML
Freq: Once | INTRAVENOUS | Status: AC
  Administered 2018-06-11: 11:00:00 via INTRAVENOUS
  Filled 2018-06-11: qty 9

## 2018-06-11 NOTE — Progress Notes (Signed)
Ok to treat today without labs result.  Pt to have CMET and CBC with diff daily starting today.

## 2018-06-11 NOTE — Progress Notes (Signed)
HEMATOLOGY/ONCOLOGY INPATIENT PROGRESS NOTE  Date of Service: 06/11/2018  Inpatient Attending: .Brunetta Genera, MD   SUBJECTIVE:   Nicholas Moreno reports that he is doing well overall.   The pt reports that he has continued to enjoy good energy levels and has been eating well and staying well hydrated. He denies diarrhea, constipation, abdominal pains or headaches.   Lab results today (06/11/18) of CBC and BMP is as follows: all values are WNL except for RBC at 3.80, HGB at 10.6, HCT at 34.4, RDW at 19.2, PLT at 409k, Calcium at 8.8.  On review of systems, pt reports good energy levels, eating well, and denies diarrhea, constipation, SOB, headaches, abdominal pains, mouth sores, leg swelling, and any other symptoms.    OBJECTIVE:  NAD  PHYSICAL EXAMINATION: . Vitals:   06/10/18 1334 06/10/18 2110 06/11/18 0700 06/11/18 1353  BP: 106/62 (!) 105/57 (!) 94/44 102/69  Pulse: 79 86 66 71  Resp: 16 20 20 16   Temp: 98.4 F (36.9 C) 98.6 F (37 C) 98.2 F (36.8 C) 97.9 F (36.6 C)  TempSrc: Oral Oral Oral Oral  SpO2: 98% 97% 100% 100%  Weight:      Height:       Filed Weights   06/10/18 1130  Weight: 126 lb 12.8 oz (57.5 kg)   .Body mass index is 20.47 kg/m.  GENERAL:alert, in no acute distress and comfortable SKIN: no acute rashes, no significant lesions EYES: conjunctiva are pink and non-injected, sclera anicteric OROPHARYNX: MMM, no exudates, no oropharyngeal erythema or ulceration NECK: supple, no JVD LYMPH:  no palpable lymphadenopathy in the cervical, axillary or inguinal regions LUNGS: clear to auscultation b/l with normal respiratory effort HEART: regular rate & rhythm ABDOMEN:  normoactive bowel sounds , non tender, not distended. No palpable hepatosplenomegaly.  Extremity: no pedal edema PSYCH: alert & oriented x 3 with fluent speech NEURO: no focal motor/sensory deficits   MEDICAL HISTORY:  History reviewed. No pertinent past medical  history.  SURGICAL HISTORY: Past Surgical History:  Procedure Laterality Date  . IR IMAGING GUIDED PORT INSERTION  04/22/2018  . MEDIASTINOSCOPY N/A 03/10/2018   Procedure: MEDIASTINOSCOPY;  Surgeon: Ivin Poot, MD;  Location: [place];  Service: Thoracic;  Laterality: N/A;  . VIDEO ASSISTED THORACOSCOPY (VATS)/ LYMPH NODE SAMPLING Left 03/10/2018   Procedure: VIDEO ASSISTED THORACOSCOPY (VATS)/ BIOSPY ANTERIOR APPROACH;  Surgeon: Ivin Poot, MD;  Location: [place];  Service: Thoracic;  Laterality: Left;  Marland Kitchen VIDEO BRONCHOSCOPY Left 03/10/2018   Procedure: VIDEO BRONCHOSCOPY;  Surgeon: Ivin Poot, MD;  Location: [facility];  Service: Thoracic;  Laterality: Left;    SOCIAL HISTORY: Social History   Socioeconomic History  . Marital status: Single    Spouse name: Not on file  . Number of children: Not on file  . Years of education: Not on file  . Highest education level: Not on file  Occupational History  . Not on file  Social Needs  . Financial resource strain: Not on file  . Food insecurity:    Worry: Not on file    Inability: Not on file  . Transportation needs:    Medical: Not on file    Non-medical: Not on file  Tobacco Use  . Smoking status: Never Smoker  . Smokeless tobacco: Never Used  Substance and Sexual Activity  . Alcohol use: Not Currently  . Drug use: Not Currently  . Sexual activity: Not on file  Lifestyle  . Physical activity:  Days per week: Not on file    Minutes per session: Not on file  . Stress: Not on file  Relationships  . Social connections:    Talks on phone: Not on file    Gets together: Not on file    Attends religious service: Not on file    Active member of club or organization: Not on file    Attends meetings of clubs or organizations: Not on file    Relationship status: Not on file  . Intimate partner violence:    Fear of current or ex partner: Not on file    Emotionally abused: Not on file    Physically abused: Not on file     Forced sexual activity: Not on file  Other Topics Concern  . Not on file  Social History Narrative  . Not on file    FAMILY HISTORY: History reviewed. No pertinent family history.  ALLERGIES:  has No Known Allergies.  MEDICATIONS:  Scheduled Meds: . apixaban  5 mg Oral BID  . 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 predniSONE  60 mg Oral QAC breakfast   Continuous Infusions: . sodium chloride 10 mL/hr at 06/11/18 1522   PRN Meds:.acetaminophen, Cold Pack, Hot Pack, senna-docusate  REVIEW OF SYSTEMS:    A 10+ POINT REVIEW OF SYSTEMS WAS OBTAINED including neurology, dermatology, psychiatry, cardiac, respiratory, lymph, extremities, GI, GU, Musculoskeletal, constitutional, breasts, reproductive, HEENT.  All pertinent positives are noted in the HPI.  All others are negative.   LABORATORY DATA:  I have reviewed the data as listed  . CBC Latest Ref Rng & Units 06/11/2018 06/10/2018 06/09/2018  WBC 4.0 - 10.5 K/uL 7.8 10.6(H) 8.8  Hemoglobin 13.0 - 17.0 g/dL 10.6(L) 10.9(L) 11.7(L)  Hematocrit 39.0 - 52.0 % 34.4(L) 35.0(L) 38.6(L)  Platelets 150 - 400 K/uL 409(H) 385 389    . CMP Latest Ref Rng & Units 06/11/2018 06/10/2018 06/09/2018  Glucose 70 - 99 mg/dL 98 121(H) 124(H)  BUN 6 - 20 mg/dL 10 10 11   Creatinine 0.61 - 1.24 mg/dL 0.81 0.72 0.76  Sodium 135 - 145 mmol/L 140 139 137  Potassium 3.5 - 5.1 mmol/L 3.5 3.8 3.5  Chloride 98 - 111 mmol/L 108 108 102  CO2 22 - 32 mmol/L 26 24 25   Calcium 8.9 - 10.3 mg/dL 8.8(L) 8.9 9.4  Total Protein 6.5 - 8.1 g/dL - - 6.8  Total Bilirubin 0.3 - 1.2 mg/dL - - 0.7  Alkaline Phos 38 - 126 U/L - - 77  AST 15 - 41 U/L - - 20  ALT 0 - 44 U/L - - 24     RADIOGRAPHIC STUDIES: I have personally reviewed the radiological images as listed and agreed with the findings in the report. Nm Pet Image Restag (ps) Skull Base To Thigh  Result Date: 06/05/2018 CLINICAL DATA:  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 EXAM: NUCLEAR MEDICINE PET SKULL BASE TO THIGH TECHNIQUE: 6.5 mCi F-18 FDG was injected intravenously. Full-ring PET imaging was performed from the skull base to thigh after the radiotracer. CT data was obtained and used for attenuation correction and anatomic localization. Fasting blood glucose: 93 mg/dl COMPARISON:  Multiple exams, including 04/03/2018 FINDINGS: Mediastinal blood pool activity: SUV max 1.6 Background hepatic activity SUV max: 2.6 NECK: No significant abnormal hypermetabolic activity in this region. 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 CHEST: Primarily cystic and part solid mediastinal mass with some irregular  calcifications noted, extending into both the left chest in the right chest in the paramediastinal regions as on the prior exam, region of involvement measuring approximately 15.6 cm transverse, previously 17.3 cm, and generally with moderate reduction in overall volume. Focal activity along the mediastinal component of the mass has appreciably reduced, with a maximum SUV of 2.8 and previously 4.4. This continues to represent Deauville 4 activity although has clearly improved. The hemithoracic component of the mass measures 2.7 by 3.7 and formerly 4.6 by 3.3 cm, maximum SUV 2.6 today and previously 5.2. Incidental CT findings: Right Port-A-Cath tip: Cavoatrial junction. ABDOMEN/PELVIS: No significant abnormal hypermetabolic activity in this region. Incidental CT findings: none SKELETON: Diffuse accentuated marrow activity without focal involvement. Incidental CT findings: none IMPRESSION: 1. Further reduction in size of the mediastinal and paramediastinal mass. Currently there is residual Deauville 4 activity. 2. Diffuse accentuated marrow activity without focal involvement. This could well be from granulocyte stimulation. 3. 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  Electronically Signed   By: Van Clines M.D.   On: 06/05/2018 11:45    ASSESSMENT & PLAN:  23 y.o. 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 PET/CT on 06/05/2018 showed: Further reduction in size of the mediastinal and paramediastinal mass. Currently there is residual Deauville 4 activity. Diffuse accentuated marrow activity without focal involvement. This could well be from granulocyte stimulation. 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  #2 s/p SVC compression due to mediastinal mass without overt SVC syndrome clinical symptoms #3 small acute distal left upper lobe pulmonary embolus-on lovenox #4 Acute Left IJ and Subclavian DVT due to left sided venous compression due to mass and due to malignancy -wason lovenox for malignancy related thrombosis-- now changed to Eliquis #4 left-sided pleural effusion and left-sided lung atelectasis due to airway compression from mediastinal mass -resolved on CXR   PLAN: -Discussed pt labwork today, 06/11/18; blood counts and chemistries are stable -The pt has no prohibitive toxicities from continuing C5D3 dose escalated EPOCH-R at this time.   -Continue eating well, staying hydrated, and staying as active as reasonably possible   -His case was discussed in tumor board and he was noted to have significant response to treatment with residual Deauville 3 disease. -Discussed with the patient that we will obtain a PET/CT scan at the end of his chemo treatments, and that that PET/CT scan will inform us if there is a role for radiation versus surveillance.  -Continue Eliquis 5mg  po BID  -Bowel prophylaxis  -Salt and baking soda mouthwashes 4-5 times per dayto minimize mucositis. -Outpatient neulasta and Rituxan on 06/16/2018 -RTC with dr Irene Limbo with labs on  06/23/2018   The total time spent in the appt was 25 minutes and more than 50% was on counseling and direct patient cares.    Sullivan Lone MD MS AAHIVMS [facility] Hematology/Oncology Physician [facility]  (Office):       [phone] (Work cell):  [phone] (Fax):           [phone]  06/11/2018 4:38 PM   I, Baldwin [place], am acting as a scribe for Dr. Sullivan Lone.   .I have reviewed the above documentation for accuracy and completeness, and I agree with the above. Sullivan Lone MD MS

## 2018-06-12 LAB — CBC WITH DIFFERENTIAL/PLATELET
Abs Immature Granulocytes: 0.02 10*3/uL (ref 0.00–0.07)
Basophils Absolute: 0 10*3/uL (ref 0.0–0.1)
Basophils Relative: 0 %
Eosinophils Absolute: 0 10*3/uL (ref 0.0–0.5)
Eosinophils Relative: 0 %
HCT: 34.9 % — ABNORMAL LOW (ref 39.0–52.0)
Hemoglobin: 10.6 g/dL — ABNORMAL LOW (ref 13.0–17.0)
Immature Granulocytes: 0 %
Lymphocytes Relative: 8 %
Lymphs Abs: 0.5 10*3/uL — ABNORMAL LOW (ref 0.7–4.0)
MCH: 27.2 pg (ref 26.0–34.0)
MCHC: 30.4 g/dL (ref 30.0–36.0)
MCV: 89.5 fL (ref 80.0–100.0)
MONOS PCT: 7 %
Monocytes Absolute: 0.4 10*3/uL (ref 0.1–1.0)
Neutro Abs: 5.2 10*3/uL (ref 1.7–7.7)
Neutrophils Relative %: 85 %
Platelets: 398 10*3/uL (ref 150–400)
RBC: 3.9 MIL/uL — ABNORMAL LOW (ref 4.22–5.81)
RDW: 18.4 % — ABNORMAL HIGH (ref 11.5–15.5)
WBC: 6.1 10*3/uL (ref 4.0–10.5)
nRBC: 0 % (ref 0.0–0.2)

## 2018-06-12 LAB — COMPREHENSIVE METABOLIC PANEL
ALBUMIN: 3.9 g/dL (ref 3.5–5.0)
ALT: 14 U/L (ref 0–44)
ANION GAP: 5 (ref 5–15)
AST: 12 U/L — ABNORMAL LOW (ref 15–41)
Alkaline Phosphatase: 57 U/L (ref 38–126)
BUN: 8 mg/dL (ref 6–20)
CO2: 27 mmol/L (ref 22–32)
Calcium: 8.8 mg/dL — ABNORMAL LOW (ref 8.9–10.3)
Chloride: 108 mmol/L (ref 98–111)
Creatinine, Ser: 0.64 mg/dL (ref 0.61–1.24)
GFR calc Af Amer: >60 mL/min (ref >60)
GFR calc non Af Amer: >60 mL/min (ref >60)
Glucose, Bld: 109 mg/dL — ABNORMAL HIGH (ref 70–99)
Potassium: 3.4 mmol/L — ABNORMAL LOW (ref 3.5–5.1)
Sodium: 140 mmol/L (ref 135–145)
Total Bilirubin: 0.7 mg/dL (ref 0.3–1.2)
Total Protein: 6 g/dL — ABNORMAL LOW (ref 6.5–8.1)

## 2018-06-12 MED ORDER — SODIUM CHLORIDE 0.9 % IV SOLN
Freq: Once | INTRAVENOUS | Status: AC
  Administered 2018-06-12: 8 mg via INTRAVENOUS
  Filled 2018-06-12: qty 4

## 2018-06-12 MED ORDER — POTASSIUM CHLORIDE CRYS ER 20 MEQ PO TBCR
20.0000 meq | EXTENDED_RELEASE_TABLET | Freq: Once | ORAL | Status: AC
  Administered 2018-06-12: 20 meq via ORAL
  Filled 2018-06-12: qty 1

## 2018-06-12 MED ORDER — VINCRISTINE SULFATE CHEMO INJECTION 1 MG/ML
Freq: Once | INTRAVENOUS | Status: AC
  Administered 2018-06-12: 12:00:00 via INTRAVENOUS
  Filled 2018-06-12: qty 9

## 2018-06-12 NOTE — Progress Notes (Addendum)
HEMATOLOGY/ONCOLOGY INPATIENT PROGRESS NOTE  Date of Service: 06/12/2018  Inpatient Attending: .Brunetta Genera, MD   SUBJECTIVE:   Nicholas Moreno reports that he is doing well overall.   The pt reports that he has continued to enjoy good energy levels and has been eating well and staying well hydrated. He denies diarrhea, constipation, abdominal pains or headaches.   Lab results today (06/12/18) of CBC and CMP is as follows: all values are WNL except for RBC at 3.90, HGB at 10.6, HCT at 34.9, RDW at 18.4, Potassium 3.4, Glucose 109, Calcium at 8.8, Total Protein 6.0, AST 12.  On review of systems, pt reports good energy levels, eating well, and denies diarrhea, constipation, SOB, headaches, abdominal pains, mouth sores, leg swelling, and any other symptoms.    OBJECTIVE:  NAD  PHYSICAL EXAMINATION: . Vitals:   06/11/18 0700 06/11/18 1353 06/11/18 2019 06/12/18 0650  BP: (!) 94/44 102/69 (!) 100/59 108/70  Pulse: 66 71 73 77  Resp: 20 16 16 16   Temp: 98.2 F (36.8 C) 97.9 F (36.6 C) (!) 97.5 F (36.4 C) 98.3 F (36.8 C)  TempSrc: Oral Oral Oral Oral  SpO2: 100% 100% 100% 100%  Weight:      Height:       Filed Weights   06/10/18 1130  Weight: 126 lb 12.8 oz (57.5 kg)   .Body mass index is 20.47 kg/m.  GENERAL:alert, in no acute distress and comfortable SKIN: no acute rashes, no significant lesions EYES: conjunctiva are pink and non-injected, sclera anicteric OROPHARYNX: MMM, no exudate, no oropharyngeal erythema or ulceration NECK: supple, no JVD LYMPH:  no palpable lymphadenopathy in the cervical, axillary or inguinal regions LUNGS: clear to auscultation b/l with normal respiratory effort HEART: regular rate & rhythm ABDOMEN:  normoactive bowel sounds , non tender, not distended. No palpable hepatosplenomegaly.  Extremity: no pedal edema PSYCH: alert & oriented x 3 with fluent speech NEURO: no focal motor/sensory deficits   MEDICAL HISTORY:  History  reviewed. No pertinent past medical history.  SURGICAL HISTORY: Past Surgical History:  Procedure Laterality Date  . IR IMAGING GUIDED PORT INSERTION  04/22/2018  . MEDIASTINOSCOPY N/A 03/10/2018   Procedure: MEDIASTINOSCOPY;  Surgeon: Ivin Poot, MD;  Location: [place];  Service: Thoracic;  Laterality: N/A;  . VIDEO ASSISTED THORACOSCOPY (VATS)/ LYMPH NODE SAMPLING Left 03/10/2018   Procedure: VIDEO ASSISTED THORACOSCOPY (VATS)/ BIOSPY ANTERIOR APPROACH;  Surgeon: Ivin Poot, MD;  Location: [place];  Service: Thoracic;  Laterality: Left;  Marland Kitchen VIDEO BRONCHOSCOPY Left 03/10/2018   Procedure: VIDEO BRONCHOSCOPY;  Surgeon: Ivin Poot, MD;  Location: [facility];  Service: Thoracic;  Laterality: Left;    SOCIAL HISTORY: Social History   Socioeconomic History  . Marital status: Single    Spouse name: Not on file  . Number of children: Not on file  . Years of education: Not on file  . Highest education level: Not on file  Occupational History  . Not on file  Social Needs  . Financial resource strain: Not on file  . Food insecurity:    Worry: Not on file    Inability: Not on file  . Transportation needs:    Medical: Not on file    Non-medical: Not on file  Tobacco Use  . Smoking status: Never Smoker  . Smokeless tobacco: Never Used  Substance and Sexual Activity  . Alcohol use: Not Currently  . Drug use: Not Currently  . Sexual activity: Not on file  Lifestyle  .  Physical activity:    Days per week: Not on file    Minutes per session: Not on file  . Stress: Not on file  Relationships  . Social connections:    Talks on phone: Not on file    Gets together: Not on file    Attends religious service: Not on file    Active member of club or organization: Not on file    Attends meetings of clubs or organizations: Not on file    Relationship status: Not on file  . Intimate partner violence:    Fear of current or ex partner: Not on file    Emotionally abused: Not on file     Physically abused: Not on file    Forced sexual activity: Not on file  Other Topics Concern  . Not on file  Social History Narrative  . Not on file    FAMILY HISTORY: History reviewed. No pertinent family history.  ALLERGIES:  has No Known Allergies.  MEDICATIONS:  Scheduled Meds: . apixaban  5 mg Oral BID  . 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 predniSONE  60 mg Oral QAC breakfast   Continuous Infusions: . sodium chloride 10 mL/hr at 06/12/18 0603  . ondansetron (ZOFRAN) with dexamethasone (DECADRON) IV     PRN Meds:.acetaminophen, Cold Pack, Hot Pack, senna-docusate  REVIEW OF SYSTEMS:    A 10+ POINT REVIEW OF SYSTEMS WAS OBTAINED including neurology, dermatology, psychiatry, cardiac, respiratory, lymph, extremities, GI, GU, Musculoskeletal, constitutional, breasts, reproductive, HEENT.  All pertinent positives are noted in the HPI.  All others are negative.   LABORATORY DATA:  I have reviewed the data as listed  . CBC Latest Ref Rng & Units 06/12/2018 06/11/2018 06/10/2018  WBC 4.0 - 10.5 K/uL 6.1 7.8 10.6(H)  Hemoglobin 13.0 - 17.0 g/dL 10.6(L) 10.6(L) 10.9(L)  Hematocrit 39.0 - 52.0 % 34.9(L) 34.4(L) 35.0(L)  Platelets 150 - 400 K/uL 398 409(H) 385    . CMP Latest Ref Rng & Units 06/12/2018 06/11/2018 06/10/2018  Glucose 70 - 99 mg/dL 109(H) 98 121(H)  BUN 6 - 20 mg/dL 8 10 10   Creatinine 0.61 - 1.24 mg/dL 0.64 0.81 0.72  Sodium 135 - 145 mmol/L 140 140 139  Potassium 3.5 - 5.1 mmol/L 3.4(L) 3.5 3.8  Chloride 98 - 111 mmol/L 108 108 108  CO2 22 - 32 mmol/L 27 26 24   Calcium 8.9 - 10.3 mg/dL 8.8(L) 8.8(L) 8.9  Total Protein 6.5 - 8.1 g/dL 6.0(L) - -  Total Bilirubin 0.3 - 1.2 mg/dL 0.7 - -  Alkaline Phos 38 - 126 U/L 57 - -  AST 15 - 41 U/L 12(L) - -  ALT 0 - 44 U/L 14 - -     RADIOGRAPHIC STUDIES: Nm Pet Image Restag (ps) Skull Base To Thigh  Result Date: 06/05/2018 CLINICAL DATA:  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 EXAM: NUCLEAR MEDICINE PET SKULL BASE TO THIGH TECHNIQUE: 6.5 mCi F-18 FDG was injected intravenously. Full-ring PET imaging was performed from the skull base to thigh after the radiotracer. CT data was obtained and used for attenuation correction and anatomic localization. Fasting blood glucose: 93 mg/dl COMPARISON:  Multiple exams, including 04/03/2018 FINDINGS: Mediastinal blood pool activity: SUV max 1.6 Background hepatic activity SUV max: 2.6 NECK: No significant abnormal hypermetabolic activity in this region. 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 CHEST: Primarily cystic and part solid mediastinal mass with some irregular calcifications noted,  extending into both the left chest in the right chest in the paramediastinal regions as on the prior exam, region of involvement measuring approximately 15.6 cm transverse, previously 17.3 cm, and generally with moderate reduction in overall volume. Focal activity along the mediastinal component of the mass has appreciably reduced, with a maximum SUV of 2.8 and previously 4.4. This continues to represent Deauville 4 activity although has clearly improved. The hemithoracic component of the mass measures 2.7 by 3.7 and formerly 4.6 by 3.3 cm, maximum SUV 2.6 today and previously 5.2. Incidental CT findings: Right Port-A-Cath tip: Cavoatrial junction. ABDOMEN/PELVIS: No significant abnormal hypermetabolic activity in this region. Incidental CT findings: none SKELETON: Diffuse accentuated marrow activity without focal involvement. Incidental CT findings: none IMPRESSION: 1. Further reduction in size of the mediastinal and paramediastinal mass. Currently there is residual Deauville 4 activity. 2. Diffuse accentuated marrow activity without focal involvement. This could well be from granulocyte stimulation. 3. 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  Electronically Signed   By: Van Clines M.D.   On: 06/05/2018 11:45    ASSESSMENT & PLAN:  23 y.o. 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 PET/CT on 06/05/2018 showed: Further reduction in size of the mediastinal and paramediastinal mass. Currently there is residual Deauville 4 activity. Diffuse accentuated marrow activity without focal involvement. This could well be from granulocyte stimulation. 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  #2 s/p SVC compression due to mediastinal mass without overt SVC syndrome clinical symptoms #3 small acute distal left upper lobe pulmonary embolus-on lovenox #4 Acute Left IJ and Subclavian DVT due to left sided venous compression due to mass and due to malignancy -wason lovenox for malignancy related thrombosis-- now changed to Eliquis #4 left-sided pleural effusion and left-sided lung atelectasis due to airway compression from mediastinal mass -resolved on CXR   PLAN: -Discussed pt labwork today, 06/12/18; blood counts and chemistries are stable. Potassium level low.  -The pt has no prohibitive toxicities from continuing C5D4 dose escalated EPOCH-R at this time.   -Continue eating well, staying hydrated, and staying as active as reasonably possible   -Continue Eliquis 5mg  po BID  -Bowel prophylaxis  -Salt and baking soda mouthwashes 4-5 times per dayto minimize mucositis. -KDur 20 Meq today -Outpatient neulasta and Rituxan on 06/16/2018 -RTC with dr Irene Limbo with labs on 06/23/2018   The total time spent in the appt was 25 minutes and more than 50% was on counseling and direct patient cares.    Nicholas Bussing, DNP, AGPCNP-BC, AOCNP  06/12/2018 9:50 AM   ADDENDUM  .Patient was Personally and independently interviewed, examined and relevant elements of  the history of present illness were reviewed in details and an assessment and plan was created. All elements of the patient's history of present illness , assessment and plan were discussed in details with Nicholas Bussing NP. The above documentation reflects our combined findings assessment and plan.  Nicholas Lone MD MS

## 2018-06-13 LAB — CBC WITH DIFFERENTIAL/PLATELET
Abs Immature Granulocytes: 0.01 10*3/uL (ref 0.00–0.07)
Basophils Absolute: 0 10*3/uL (ref 0.0–0.1)
Basophils Relative: 0 %
Eosinophils Absolute: 0 10*3/uL (ref 0.0–0.5)
Eosinophils Relative: 0 %
HEMATOCRIT: 35.4 % — AB (ref 39.0–52.0)
Hemoglobin: 11 g/dL — ABNORMAL LOW (ref 13.0–17.0)
Immature Granulocytes: 0 %
Lymphocytes Relative: 12 %
Lymphs Abs: 0.5 10*3/uL — ABNORMAL LOW (ref 0.7–4.0)
MCH: 27.4 pg (ref 26.0–34.0)
MCHC: 31.1 g/dL (ref 30.0–36.0)
MCV: 88.3 fL (ref 80.0–100.0)
Monocytes Absolute: 0.2 10*3/uL (ref 0.1–1.0)
Monocytes Relative: 4 %
Neutro Abs: 3.3 10*3/uL (ref 1.7–7.7)
Neutrophils Relative %: 84 %
Platelets: 412 10*3/uL — ABNORMAL HIGH (ref 150–400)
RBC: 4.01 MIL/uL — ABNORMAL LOW (ref 4.22–5.81)
RDW: 17.8 % — AB (ref 11.5–15.5)
WBC: 3.9 10*3/uL — ABNORMAL LOW (ref 4.0–10.5)
nRBC: 0 % (ref 0.0–0.2)

## 2018-06-13 LAB — COMPREHENSIVE METABOLIC PANEL
ALK PHOS: 55 U/L (ref 38–126)
ALT: 14 U/L (ref 0–44)
AST: 12 U/L — ABNORMAL LOW (ref 15–41)
Albumin: 4.1 g/dL (ref 3.5–5.0)
Anion gap: 6 (ref 5–15)
BUN: 11 mg/dL (ref 6–20)
CALCIUM: 9.1 mg/dL (ref 8.9–10.3)
CO2: 28 mmol/L (ref 22–32)
Chloride: 104 mmol/L (ref 98–111)
Creatinine, Ser: 0.71 mg/dL (ref 0.61–1.24)
GFR calc Af Amer: >60 mL/min (ref >60)
GFR calc non Af Amer: >60 mL/min (ref >60)
GLUCOSE: 100 mg/dL — AB (ref 70–99)
Potassium: 3.5 mmol/L (ref 3.5–5.1)
Sodium: 138 mmol/L (ref 135–145)
Total Bilirubin: 1 mg/dL (ref 0.3–1.2)
Total Protein: 6.4 g/dL — ABNORMAL LOW (ref 6.5–8.1)

## 2018-06-13 MED ORDER — SODIUM CHLORIDE 0.9 % IV SOLN
Freq: Once | INTRAVENOUS | Status: AC
  Administered 2018-06-13: 36 mg via INTRAVENOUS
  Filled 2018-06-13: qty 8

## 2018-06-13 MED ORDER — SODIUM CHLORIDE 0.9 % IV SOLN
900.0000 mg/m2 | Freq: Once | INTRAVENOUS | Status: AC
  Administered 2018-06-13: 1420 mg via INTRAVENOUS
  Filled 2018-06-13: qty 71

## 2018-06-13 MED ORDER — SODIUM CHLORIDE 0.9% FLUSH: 10.0000 mL | INTRAVENOUS | Status: DC | PRN

## 2018-06-13 NOTE — Discharge Summary (Addendum)
Discharge Summary  Patient ID: Nicholas Moreno MRN: [id number] DOB/AGE: February 20, 1996 23 y.o.  Admit date: 06/09/2018 Discharge date: 06/13/2018  Primary Care Physician:  Patient, No Pcp Per  Discharge Diagnoses:  Primary Mediastinal B cell lymphoma admitted for C5 of EPOCH-R  Present on Admission: . 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   polyethylene glycol packet Commonly known as:  MIRALAX / GLYCOLAX Take 17 g by mouth daily.   senna-docusate 8.6-50 MG tablet Commonly known as:  Senokot-S Take 2 tablets by mouth at bedtime as needed for mild constipation.        Disposition and Follow-up:  -Outpatient Rituxan and Neulasta on 06/16/2018 --Will see pt back in clinic on 06/24/2018 for toxicity and nadir check  CBC    Component Value Date/Time   WBC 3.9 (L) 06/13/2018 0443   RBC 4.01 (L) 06/13/2018 0443   HGB 11.0 (L) 06/13/2018 0443   HGB 11.3 (L) 04/21/2018 1103   HCT 35.4 (L) 06/13/2018 0443   PLT 412 (H) 06/13/2018 0443   PLT 288 04/21/2018 1103   MCV 88.3 06/13/2018 0443   MCH 27.4 06/13/2018 0443   MCHC 31.1 06/13/2018 0443   RDW 17.8 (H) 06/13/2018 0443   LYMPHSABS 0.5 (L) 06/13/2018 0443   MONOABS 0.2 06/13/2018 0443   EOSABS 0.0 06/13/2018 0443   BASOSABS 0.0 06/13/2018 0443   CMP Latest Ref Rng & Units 06/13/2018 06/12/2018 06/11/2018  Glucose 70 - 99 mg/dL 100(H) 109(H) 98  BUN 6 - 20 mg/dL 11 8 10   Creatinine 0.61 - 1.24 mg/dL 0.71 0.64 0.81  Sodium 135 - 145 mmol/L 138 140 140  Potassium 3.5 - 5.1 mmol/L 3.5 3.4(L) 3.5  Chloride 98 - 111 mmol/L 104 108 108  CO2 22 - 32 mmol/L 28 27 26    Calcium 8.9 - 10.3 mg/dL 9.1 8.8(L) 8.8(L)  Total Protein 6.5 - 8.1 g/dL 6.4(L) 6.0(L) -  Total Bilirubin 0.3 - 1.2 mg/dL 1.0 0.7 -  Alkaline Phos 38 - 126 U/L 55 57 -  AST 15 - 41 U/L 12(L) 12(L) -  ALT 0 - 44 U/L 14 14 -    Brief H and P:  For complete details please refer to admission H and P, but in brief, Nicholas Moreno a wonderful 22 y.o.malewho has been admitted today for [name] of his Primary Mediastinal B Cell Non Hodgkin's Lymphoma. Hereports thatheis doing well overall.   The pt reportsthat he has not developed any concerns since our last visit in clinic. He endorses good energy levels, has been eating well, and has rested well. He denies any mouth sores or concerns for infections. He notes that he has gained weight in the interim as well.  Lab results on admission (06/09/2018) CBC w/diff, CMP, magnesium is as follows: all values are WNL except for HGB at 11.7, HCT at 38.6, RDW at 18.9, absolute lymphocytes 0.6, glucose 124.  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  #2  s/p SVC compression due to mediastinal mass without overt SVC syndrome clinical symptoms #3 small acute distal left upper lobe pulmonary embolus-on lovenox #4 Acute Left IJ and Subclavian DVT due to left sided venous compression due to mass and due to malignancy -wason lovenox for malignancy related thrombosis-- now changed to Eliquis #4 left-sided pleural effusion and left-sided lung atelectasis due to airway compression from mediastinal mass -resolved on CXR   PLAN: -Discussed pt labwork today,06/13/2018;blood counts and chemistries are stable -The pt has no prohibitive toxicities from [employer] this time.  -Continue eating well, staying hydrated, and staying as active  as reasonably possible -Chemotherapy orders placed, reviewed and discussed with pharmacist. -Outpatient Rituxan and Neulasta on 06/16/2018 -Continue Eliquis 5mg  po BID  -Salt and baking soda mouthwashes 4-5 times per dayto minimize mucositis. -Will see pt back in clinic on 3/17//20 for toxicity and nadir check  BP 97/61 (BP Location: Right Arm)   Pulse 70   Temp 97.8 F (36.6 C) (Oral)   Resp 14   Ht 5\' 6"  (1.676 m)   Wt 126 lb 12.8 oz (57.5 kg)   SpO2 100%   BMI 20.47 kg/m   GENERAL:alert, in no acute distress and comfortable SKIN: no acute rashes, no significant lesions EYES: conjunctiva are pink and non-injected, sclera anicteric OROPHARYNX: MMM, no exudates, no oropharyngeal erythema or ulceration NECK: supple, no JVD LYMPH:  no palpable lymphadenopathy in the cervical, axillary or inguinal regions LUNGS: clear to auscultation b/l with normal respiratory effort HEART: regular rate & rhythm ABDOMEN:  normoactive bowel sounds , non tender, not distended. 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 All elements of the patient's history of present illness , assessment and plan were discussed in details with Nicholas Bussing NP. The above documentation reflects our combined findings assessment and plan.  Nicholas Lone MD MS TT discharging patient>56mins

## 2018-06-13 NOTE — Progress Notes (Signed)
Pt discharged home with family in stable condition. Discharge instructions given. Pt verbalized understanding. No immediate questions or concerns at this time. Pt chose to ambulate off unit.

## 2018-06-16 ENCOUNTER — Inpatient Hospital Stay: Payer: BLUE CROSS/BLUE SHIELD | Attending: Hematology

## 2018-06-16 VITALS — BP 110/63 | HR 93 | Temp 98.1°F | Resp 16

## 2018-06-16 DIAGNOSIS — C8528 Mediastinal (thymic) large B-cell lymphoma, lymph nodes of multiple sites: Secondary | ICD-10-CM

## 2018-06-16 DIAGNOSIS — Z5189 Encounter for other specified aftercare: Secondary | ICD-10-CM | POA: Insufficient documentation

## 2018-06-16 DIAGNOSIS — Z7901 Long term (current) use of anticoagulants: Secondary | ICD-10-CM | POA: Insufficient documentation

## 2018-06-16 DIAGNOSIS — Z86718 Personal history of other venous thrombosis and embolism: Secondary | ICD-10-CM | POA: Diagnosis not present

## 2018-06-16 DIAGNOSIS — Z79899 Other long term (current) drug therapy: Secondary | ICD-10-CM | POA: Insufficient documentation

## 2018-06-16 DIAGNOSIS — Z5112 Encounter for antineoplastic immunotherapy: Secondary | ICD-10-CM | POA: Diagnosis not present

## 2018-06-16 MED ORDER — HEPARIN SOD (PORK) LOCK FLUSH 100 UNIT/ML IV SOLN
500.0000 [IU] | Freq: Once | INTRAVENOUS | Status: AC | PRN
  Administered 2018-06-16: 500 [IU]
  Filled 2018-06-16: qty 5

## 2018-06-16 MED ORDER — PEGFILGRASTIM-CBQV 6 MG/0.6ML ~~LOC~~ SOSY
6.0000 mg | PREFILLED_SYRINGE | Freq: Once | SUBCUTANEOUS | Status: AC
  Administered 2018-06-16: 6 mg via SUBCUTANEOUS

## 2018-06-16 MED ORDER — SODIUM CHLORIDE 0.9 % IV SOLN
375.0000 mg/m2 | Freq: Once | INTRAVENOUS | Status: AC
  Administered 2018-06-16: 600 mg via INTRAVENOUS
  Filled 2018-06-16: qty 60

## 2018-06-16 MED ORDER — DIPHENHYDRAMINE HCL 25 MG PO CAPS
50.0000 mg | ORAL_CAPSULE | Freq: Once | ORAL | Status: AC
  Administered 2018-06-16: 50 mg via ORAL

## 2018-06-16 MED ORDER — SODIUM CHLORIDE 0.9% FLUSH
10.0000 mL | INTRAVENOUS | Status: DC | PRN
  Administered 2018-06-16: 10 mL
  Filled 2018-06-16: qty 10

## 2018-06-16 MED ORDER — ACETAMINOPHEN 325 MG PO TABS
650.0000 mg | ORAL_TABLET | Freq: Once | ORAL | Status: AC
  Administered 2018-06-16: 650 mg via ORAL

## 2018-06-16 MED ORDER — ACETAMINOPHEN 325 MG PO TABS
ORAL_TABLET | ORAL | Status: AC
  Filled 2018-06-16: qty 2

## 2018-06-16 MED ORDER — SODIUM CHLORIDE 0.9 % IV SOLN
Freq: Once | INTRAVENOUS | Status: AC
  Administered 2018-06-16: 08:00:00 via INTRAVENOUS
  Filled 2018-06-16: qty 250

## 2018-06-16 MED ORDER — DIPHENHYDRAMINE HCL 25 MG PO CAPS
ORAL_CAPSULE | ORAL | Status: AC
  Filled 2018-06-16: qty 2

## 2018-06-16 MED ORDER — PEGFILGRASTIM-CBQV 6 MG/0.6ML ~~LOC~~ SOSY
PREFILLED_SYRINGE | SUBCUTANEOUS | Status: AC
  Filled 2018-06-16: qty 0.6

## 2018-06-16 NOTE — Patient Instructions (Signed)
[facility] Discharge Instructions for Patients Receiving Chemotherapy  Today you received the following chemotherapy agents :  Rituxan,  Udenyca.  To help prevent nausea and vomiting after your treatment, we encourage you to take your nausea medication as prescribed.   If you develop nausea and vomiting that is not controlled by your nausea medication, call the clinic.   BELOW ARE SYMPTOMS THAT SHOULD BE REPORTED IMMEDIATELY:  *FEVER GREATER THAN 100.5 F  *CHILLS WITH OR WITHOUT FEVER  NAUSEA AND VOMITING THAT IS NOT CONTROLLED WITH YOUR NAUSEA MEDICATION  *UNUSUAL SHORTNESS OF BREATH  *UNUSUAL BRUISING OR BLEEDING  TENDERNESS IN MOUTH AND THROAT WITH OR WITHOUT PRESENCE OF ULCERS  *URINARY PROBLEMS  *BOWEL PROBLEMS  UNUSUAL RASH Items with * indicate a potential emergency and should be followed up as soon as possible.  Feel free to call the clinic should you have any questions or concerns. The clinic phone number is [phone].  Please show the CHEMO ALERT CARD at check-in to the Emergency Department and triage nurse.   

## 2018-06-23 ENCOUNTER — Other Ambulatory Visit: Payer: Self-pay | Admitting: *Deleted

## 2018-06-23 DIAGNOSIS — C8528 Mediastinal (thymic) large B-cell lymphoma, lymph nodes of multiple sites: Secondary | ICD-10-CM

## 2018-06-23 NOTE — Progress Notes (Signed)
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 male who has been referred to [place] by Dr .Nicholas Brood, MD  for evaluation and management of a newly noted very large partially cavitated anterior mediastinal mass concerning for he high-grade lymphoma.  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 Patient notes that he started feeling unwell in August when he noted gradually increasing fatigue and weight loss.  Patient notes subsequently in September and October he developed new cough with progressive shortness of breath which triggered multiple visits with his primary care physician and was treated symptomatically and with inhalers.  Patient reports no imaging studies at the time.  Patient reports he has felt poorly during the last 2 to 3 months and during his recent visits to the [place] in [place]. He reports a total unintentional weight loss of close to 20 pounds. He also reports drenching night sweats over the last 4 to 6 weeks.  He reports that his cough and shortness of breath progressively got much more severe which led to him presenting to the emergency room yesterday. Notes nonproductive cough. No overt fevers.  Patient had a CTA of the chest on 03/07/2018 which showed  "Small acute pulmonary emboli identified within the distal left upper lobe lobar pulmonary artery. 2. 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 Primary differential considerations include lymphoma and leukemia as well as malignant derm cell tumors.  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 Narrowing and displacement of the trachea with complete occlusion of the upper lobe bronchi. There is also marked narrowing of bilateral upper lobe pulmonary arteries. 3. Significantly diminished aeration to both upper lobes, left greater than right. 4. Moderate left pleural effusion."  Patient is currently on room air and saturating 99% with stable hemodynamics. He has been started on IV heparin due to his small pulmonary embolism. 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  No headaches.  No facial swelling.  No upper extremity swelling.  No overt clinical symptomatology of SVC syndrome despite radiographic findings of some SVC narrowing.  No overt chest pain at this time.  Interval History:   Nicholas Moreno returns today for management and evaluation of his Newly diagnosed Primary mediastinal B cell Non hodgkins lymphoma. I last saw the pt on 06/13/18 with Nicholas Moreno discharge. The pt reports that he is doing well overall.   The pt reports that he has not developed any new concerns in the interim. He has not developed any concerns for infections, fevers, or chills. The pt endorses stable breathing, has been eating well, and endorses good energy levels. He denies any leg swelling, SOB, or CP. He also notes that he has continued to tolerate Rituxan very well and denies skin rashes or diarrhea.  Lab results today (06/24/18) of CBC is as follows: all values are WNL except for WBC at 29.2k, RBC at 4.04, HGB  at 11.1, HCT at 35.7, RDW at 18.5, nRBC at 0.7%. 06/24/18 CMP, differential, and LDH are pending  On review of systems, pt reports good energy levels, breathing well, and denies fevers,  chills, mouth sores, constipation, diarrhea, SOB, CP, leg swelling, concerns for infections, abdominal pains, testicular pain or swelling, skin rashes, problems with the port, and any other symptoms.  MEDICAL HISTORY:  No past medical history on file.  SURGICAL HISTORY: Past Surgical History:  Procedure Laterality Date   IR IMAGING GUIDED PORT INSERTION  04/22/2018   MEDIASTINOSCOPY N/A 03/10/2018   Procedure: MEDIASTINOSCOPY;  Surgeon: Nicholas Poot, MD;  Location: [place];  Service: Thoracic;  Laterality: N/A;   VIDEO ASSISTED THORACOSCOPY (VATS)/ LYMPH NODE SAMPLING Left 03/10/2018   Procedure: VIDEO ASSISTED THORACOSCOPY (VATS)/ BIOSPY ANTERIOR APPROACH;  Surgeon: Nicholas Poot, MD;  Location: [place];  Service: Thoracic;  Laterality: Left;   VIDEO BRONCHOSCOPY Left 03/10/2018   Procedure: VIDEO BRONCHOSCOPY;  Surgeon: Nicholas Moreno, Nicholas Salina, MD;  Location: [place];  Service: Thoracic;  Laterality: Left;    SOCIAL HISTORY: Social History   Socioeconomic History   Marital status: Single    Spouse name: Not on file   Number of children: Not on file   Years of education: Not on file   Highest education level: Not on file  Occupational History   Not on file  Social Needs   Financial resource strain: Not on file   Food insecurity:    Worry: Not on file    Inability: Not on file   Transportation needs:    Medical: Not on file    Non-medical: Not on file  Tobacco Use   Smoking status: Never Smoker   Smokeless tobacco: Never Used  Substance and Sexual Activity   Alcohol use: Not Currently   Drug use: Not Currently   Sexual activity: Not on file  Lifestyle   Physical activity:    Days per week: Not on file    Minutes per session: Not on file   Stress: Not on file  Relationships   Social connections:    Talks on phone: Not on file    Gets together: Not on file    Attends religious service: Not on file    Active member of club or organization: Not on file     Attends meetings of clubs or organizations: Not on file    Relationship status: Not on file   Intimate partner violence:    Fear of current or ex partner: Not on file    Emotionally abused: Not on file    Physically abused: Not on file    Forced sexual activity: Not on file  Other Topics Concern   Not on file  Social History Narrative   Not on file    FAMILY HISTORY: No family history on file.  ALLERGIES:  has No Known Allergies.  MEDICATIONS:  Current Outpatient Medications  Medication Sig Dispense Refill   apixaban (ELIQUIS) 5 MG TABS tablet Take 1 tablet (5 mg total) by mouth 2 (two) times daily. 60 tablet 4   polyethylene glycol (MIRALAX / GLYCOLAX) packet Take 17 g by mouth daily. (Patient not taking: Reported on 05/19/2018) 14 each 0   senna-docusate (SENOKOT-S) 8.6-50 MG tablet Take 2 tablets by mouth at bedtime as needed for mild constipation. (Patient not taking: Reported on 04/28/2018) 60 tablet 0   No current facility-administered medications for this visit.     REVIEW OF SYSTEMS:    A 10+  POINT REVIEW OF SYSTEMS WAS OBTAINED including neurology, dermatology, psychiatry, cardiac, respiratory, lymph, extremities, GI, GU, Musculoskeletal, constitutional, breasts, reproductive, HEENT.  All pertinent positives are noted in the HPI.  All others are negative.   PHYSICAL EXAMINATION: ECOG PERFORMANCE STATUS: 1 - Symptomatic but completely ambulatory  . Vitals:   06/24/18 0940  BP: 109/68  Pulse: 80  Resp: 18  Temp: 98.2 F (36.8 C)  SpO2: 100%   Filed Weights   06/24/18 0940  Weight: 131 lb 12.8 oz (59.8 kg)   .Body mass index is 21.27 kg/m.  GENERAL:alert, in no acute distress and comfortable SKIN: no acute rashes, no significant lesions EYES: conjunctiva are pink and non-injected, sclera anicteric OROPHARYNX: MMM, no exudates, no oropharyngeal erythema or ulceration NECK: supple, no JVD LYMPH:  no palpable lymphadenopathy in the cervical, axillary or  inguinal regions LUNGS: clear to auscultation b/l with normal respiratory effort HEART: regular rate & rhythm ABDOMEN:  normoactive bowel sounds , non tender, not distended. No palpable hepatosplenomegaly.  Extremity: no pedal edema PSYCH: alert & oriented x 3 with fluent speech NEURO: no focal motor/sensory deficits   LABORATORY DATA:  I have reviewed the data as listed  . CBC Latest Ref Rng & Units 06/24/2018 06/13/2018 06/12/2018  WBC 4.0 - 10.5 K/uL 29.2(H) 3.9(L) 6.1  Hemoglobin 13.0 - 17.0 g/dL 11.1(L) 11.0(L) 10.6(L)  Hematocrit 39.0 - 52.0 % 35.7(L) 35.4(L) 34.9(L)  Platelets 150 - 400 K/uL 185 412(H) 398    . CMP Latest Ref Rng & Units 06/13/2018 06/12/2018 06/11/2018  Glucose 70 - 99 mg/dL 100(H) 109(H) 98  BUN 6 - 20 mg/dL 11 8 10   Creatinine 0.61 - 1.24 mg/dL 0.71 0.64 0.81  Sodium 135 - 145 mmol/L 138 140 140  Potassium 3.5 - 5.1 mmol/L 3.5 3.4(L) 3.5  Chloride 98 - 111 mmol/L 104 108 108  CO2 22 - 32 mmol/L 28 27 26   Calcium 8.9 - 10.3 mg/dL 9.1 8.8(L) 8.8(L)  Total Protein 6.5 - 8.1 g/dL 6.4(L) 6.0(L) -  Total Bilirubin 0.3 - 1.2 mg/dL 1.0 0.7 -  Alkaline Phos 38 - 126 U/L 55 57 -  AST 15 - 41 U/L 12(L) 12(L) -  ALT 0 - 44 U/L 14 14 -   03/10/18 Biopsy:     03/10/18 Tissue Flow Cytometry:     RADIOGRAPHIC STUDIES: I have personally reviewed the radiological images as listed and agreed with the findings in the report. 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 EXAM: NUCLEAR MEDICINE PET SKULL BASE TO THIGH TECHNIQUE: 6.5 mCi F-18 FDG was injected intravenously. Full-ring PET imaging was performed from the skull base to thigh after the radiotracer. CT data was obtained and used for attenuation correction and anatomic localization. Fasting blood glucose: 93 mg/dl COMPARISON:  Multiple exams, including 04/03/2018 FINDINGS: Mediastinal blood pool activity: SUV max 1.6  Background hepatic activity SUV max: 2.6 NECK: No significant abnormal hypermetabolic activity in this region. 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 CHEST: Primarily cystic and part solid mediastinal mass with some irregular calcifications noted, extending into both the left chest in the right chest in the paramediastinal regions as on the prior exam, region of involvement measuring approximately 15.6 cm transverse, previously 17.3 cm, and generally with moderate reduction in overall volume. Focal activity along the mediastinal component of the mass has appreciably reduced, with a maximum SUV of 2.8 and  previously 4.4. This continues to represent Deauville 4 activity although has clearly improved. The hemithoracic component of the mass measures 2.7 by 3.7 and formerly 4.6 by 3.3 cm, maximum SUV 2.6 today and previously 5.2. Incidental CT findings: Right Port-A-Cath tip: Cavoatrial junction. ABDOMEN/PELVIS: No significant abnormal hypermetabolic activity in this region. Incidental CT findings: none SKELETON: Diffuse accentuated marrow activity without focal involvement. Incidental CT findings: none IMPRESSION: 1. Further reduction in size of the mediastinal and paramediastinal mass. Currently there is residual Deauville 4 activity. 2. Diffuse accentuated marrow activity without focal involvement. This could well be from granulocyte stimulation. 3. 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 Electronically Signed   By: Van Clines M.D.   On: 06/05/2018 11:45    ASSESSMENT & PLAN:  23 y.o. male with  #1Recently diagnosed Primary Mediastinal B cell Non Hodgkins lymphoma c-MYC negative Not a double hit lymphoma Pathology confirmed by pathology read at [facility].  #2 s/p SVC compression due to mediastinal mass without overt SVC syndrome clinical symptoms #3 small acute distal left upper lobe pulmonary  embolus-on lovenox #4 Acute Left IJ and Subclavian DVT due to left sided venous compression due to mass and due to malignancy -wason lovenox for malignancy related thrombosis-- now changed to Eliquis #4 left-sided pleural effusion and left-sided lung atelectasis due to airway compression from mediastinal mass -resolved on CXR   PLAN: -Discussed pt labwork today, 06/24/18; WBC at 29.2k in setting of Neulasta, HGB holding at 11.1 -Will complete repeat PET/CT 4-6 weeks after completing C6 dose escalated EPOCH-R -The pt has no prohibitive toxicities from C5 dose escalated EPOCH-R -Advised continued crowd avoidance and infection prevention strategies -Continue Eliquis 5mg  po BID  -Salt and baking soda mouthwashes 4-5 times per dayto minimize mucositis. -Will plan for [facility] admission on 06/30/18 -Outpatient Rituxan and Neulasta on 07/07/18 -Will see the pt back in clinic on 07/14/18   Inpatient admit for [facility] chemotherapy for 5 days from 06/30/2018 Outpatient Rituxan and Neulasta on 07/07/2018 RTC with labs with Dr Irene Limbo on 07/14/2018 PET/CT in 6 weeks RTC with Dr Irene Limbo in 7 weeks with labs   All of the patients questions were answered with apparent satisfaction. The patient knows to call the clinic with any problems, questions or concerns.  The total time spent in the appt was 25 minutes and more than 50% was on counseling and direct patient cares.    Sullivan Lone MD MS AAHIVMS [facility] Hematology/Oncology Physician [facility]  (Office):       [phone] (Work cell):  [phone] (Fax):           [phone]  06/24/2018 10:05 AM  I, Baldwin Jamaica, am acting as a scribe for Dr. Sullivan Lone.   .I have reviewed the above documentation for accuracy and completeness, and I agree with the above. Brunetta Genera MD

## 2018-06-24 ENCOUNTER — Telehealth: Payer: Self-pay | Admitting: Hematology

## 2018-06-24 ENCOUNTER — Other Ambulatory Visit: Payer: Self-pay

## 2018-06-24 ENCOUNTER — Inpatient Hospital Stay (HOSPITAL_BASED_OUTPATIENT_CLINIC_OR_DEPARTMENT_OTHER): Payer: BLUE CROSS/BLUE SHIELD | Admitting: Hematology

## 2018-06-24 ENCOUNTER — Inpatient Hospital Stay: Payer: BLUE CROSS/BLUE SHIELD

## 2018-06-24 VITALS — BP 109/68 | HR 80 | Temp 98.2°F | Resp 18 | Ht 66.0 in | Wt 131.8 lb

## 2018-06-24 DIAGNOSIS — Z5112 Encounter for antineoplastic immunotherapy: Secondary | ICD-10-CM | POA: Diagnosis not present

## 2018-06-24 DIAGNOSIS — Z7901 Long term (current) use of anticoagulants: Secondary | ICD-10-CM

## 2018-06-24 DIAGNOSIS — Z95828 Presence of other vascular implants and grafts: Secondary | ICD-10-CM

## 2018-06-24 DIAGNOSIS — C8522 Mediastinal (thymic) large B-cell lymphoma, intrathoracic lymph nodes: Secondary | ICD-10-CM | POA: Diagnosis not present

## 2018-06-24 DIAGNOSIS — Z7189 Other specified counseling: Secondary | ICD-10-CM

## 2018-06-24 DIAGNOSIS — Z86718 Personal history of other venous thrombosis and embolism: Secondary | ICD-10-CM | POA: Diagnosis not present

## 2018-06-24 DIAGNOSIS — C8528 Mediastinal (thymic) large B-cell lymphoma, lymph nodes of multiple sites: Secondary | ICD-10-CM

## 2018-06-24 LAB — CBC WITH DIFFERENTIAL (CANCER CENTER ONLY)
Abs Immature Granulocytes: 4.74 10*3/uL — ABNORMAL HIGH (ref 0.00–0.07)
Basophils Absolute: 0.2 10*3/uL — ABNORMAL HIGH (ref 0.0–0.1)
Basophils Relative: 1 %
Eosinophils Absolute: 0.2 10*3/uL (ref 0.0–0.5)
Eosinophils Relative: 1 %
HCT: 35.7 % — ABNORMAL LOW (ref 39.0–52.0)
Hemoglobin: 11.1 g/dL — ABNORMAL LOW (ref 13.0–17.0)
IMMATURE GRANULOCYTES: 16 %
Lymphocytes Relative: 4 %
Lymphs Abs: 1.3 10*3/uL (ref 0.7–4.0)
MCH: 27.5 pg (ref 26.0–34.0)
MCHC: 31.1 g/dL (ref 30.0–36.0)
MCV: 88.4 fL (ref 80.0–100.0)
Monocytes Absolute: 2.5 10*3/uL — ABNORMAL HIGH (ref 0.1–1.0)
Monocytes Relative: 8 %
Neutro Abs: 20.3 10*3/uL — ABNORMAL HIGH (ref 1.7–7.7)
Neutrophils Relative %: 70 %
Platelet Count: 185 10*3/uL (ref 150–400)
RBC: 4.04 MIL/uL — ABNORMAL LOW (ref 4.22–5.81)
RDW: 18.5 % — ABNORMAL HIGH (ref 11.5–15.5)
Smear Review: INCREASED
WBC: 29.2 10*3/uL — AB (ref 4.0–10.5)
nRBC: 0.7 % — ABNORMAL HIGH (ref 0.0–0.2)

## 2018-06-24 LAB — CMP (CANCER CENTER ONLY)
ALT: 79 U/L — ABNORMAL HIGH (ref 0–44)
AST: 22 U/L (ref 15–41)
Albumin: 3.9 g/dL (ref 3.5–5.0)
Alkaline Phosphatase: 146 U/L — ABNORMAL HIGH (ref 38–126)
Anion gap: 13 (ref 5–15)
BILIRUBIN TOTAL: 0.3 mg/dL (ref 0.3–1.2)
BUN: 8 mg/dL (ref 6–20)
CO2: 23 mmol/L (ref 22–32)
Calcium: 9.3 mg/dL (ref 8.9–10.3)
Chloride: 105 mmol/L (ref 98–111)
Creatinine: 0.78 mg/dL (ref 0.61–1.24)
GFR, Est AFR Am: >60 mL/min (ref >60)
Glucose, Bld: 92 mg/dL (ref 70–99)
POTASSIUM: 4 mmol/L (ref 3.5–5.1)
Sodium: 141 mmol/L (ref 135–145)
Total Protein: 6.8 g/dL (ref 6.5–8.1)

## 2018-06-24 LAB — LACTATE DEHYDROGENASE: LDH: 310 U/L — ABNORMAL HIGH (ref 98–192)

## 2018-06-24 MED ORDER — SODIUM CHLORIDE 0.9% FLUSH
10.0000 mL | INTRAVENOUS | Status: DC | PRN
  Administered 2018-06-24: 10 mL
  Filled 2018-06-24: qty 10

## 2018-06-24 MED ORDER — HEPARIN SOD (PORK) LOCK FLUSH 100 UNIT/ML IV SOLN
500.0000 [IU] | Freq: Once | INTRAVENOUS | Status: AC | PRN
  Administered 2018-06-24: 500 [IU]
  Filled 2018-06-24: qty 5

## 2018-06-24 NOTE — Telephone Encounter (Signed)
Gave avs  ° °

## 2018-06-26 ENCOUNTER — Telehealth: Payer: Self-pay | Admitting: *Deleted

## 2018-06-26 NOTE — Telephone Encounter (Signed)
Patient scheduled for inpatient admission AM of 3/23 per [facility] in bed placement.Notified 6E and pharmacy. Patient contacted and told to expect call on 3/23. Patient verbalized understanding.

## 2018-06-30 ENCOUNTER — Inpatient Hospital Stay (HOSPITAL_COMMUNITY)
Admission: AD | Admit: 2018-06-30 | Discharge: 2018-07-04 | DRG: 847 | Disposition: A | Discharge status: Home / Self Care | Payer: BLUE CROSS/BLUE SHIELD | Attending: Hematology | Admitting: Hematology

## 2018-06-30 ENCOUNTER — Other Ambulatory Visit: Payer: Self-pay

## 2018-06-30 DIAGNOSIS — Z7189 Other specified counseling: Secondary | ICD-10-CM

## 2018-06-30 DIAGNOSIS — Z5111 Encounter for antineoplastic chemotherapy: Principal | ICD-10-CM

## 2018-06-30 DIAGNOSIS — Z86711 Personal history of pulmonary embolism: Secondary | ICD-10-CM | POA: Diagnosis not present

## 2018-06-30 DIAGNOSIS — C8528 Mediastinal (thymic) large B-cell lymphoma, lymph nodes of multiple sites: Secondary | ICD-10-CM

## 2018-06-30 DIAGNOSIS — Z86718 Personal history of other venous thrombosis and embolism: Secondary | ICD-10-CM

## 2018-06-30 DIAGNOSIS — Z7901 Long term (current) use of anticoagulants: Secondary | ICD-10-CM

## 2018-06-30 DIAGNOSIS — E876 Hypokalemia: Secondary | ICD-10-CM | POA: Diagnosis present

## 2018-06-30 DIAGNOSIS — C8522 Mediastinal (thymic) large B-cell lymphoma, intrathoracic lymph nodes: Secondary | ICD-10-CM | POA: Diagnosis present

## 2018-06-30 DIAGNOSIS — J910 Malignant pleural effusion: Secondary | ICD-10-CM

## 2018-06-30 LAB — COMPREHENSIVE METABOLIC PANEL
ALT: 52 U/L — ABNORMAL HIGH (ref 0–44)
AST: 24 U/L (ref 15–41)
Albumin: 3.7 g/dL (ref 3.5–5.0)
Alkaline Phosphatase: 108 U/L (ref 38–126)
Anion gap: 8 (ref 5–15)
BUN: 11 mg/dL (ref 6–20)
CHLORIDE: 103 mmol/L (ref 98–111)
CO2: 27 mmol/L (ref 22–32)
Calcium: 9 mg/dL (ref 8.9–10.3)
Creatinine, Ser: 0.71 mg/dL (ref 0.61–1.24)
GFR calc Af Amer: >60 mL/min (ref >60)
Glucose, Bld: 100 mg/dL — ABNORMAL HIGH (ref 70–99)
Potassium: 4 mmol/L (ref 3.5–5.1)
Sodium: 138 mmol/L (ref 135–145)
Total Bilirubin: 0.5 mg/dL (ref 0.3–1.2)
Total Protein: 6.4 g/dL — ABNORMAL LOW (ref 6.5–8.1)

## 2018-06-30 LAB — CBC WITH DIFFERENTIAL/PLATELET
ABS IMMATURE GRANULOCYTES: 0.05 10*3/uL (ref 0.00–0.07)
Basophils Absolute: 0 10*3/uL (ref 0.0–0.1)
Basophils Relative: 0 %
Eosinophils Absolute: 0 10*3/uL (ref 0.0–0.5)
Eosinophils Relative: 0 %
HCT: 34.6 % — ABNORMAL LOW (ref 39.0–52.0)
HEMOGLOBIN: 10.9 g/dL — AB (ref 13.0–17.0)
Immature Granulocytes: 1 %
Lymphocytes Relative: 9 %
Lymphs Abs: 0.9 10*3/uL (ref 0.7–4.0)
MCH: 27.9 pg (ref 26.0–34.0)
MCHC: 31.5 g/dL (ref 30.0–36.0)
MCV: 88.5 fL (ref 80.0–100.0)
Monocytes Absolute: 0.7 10*3/uL (ref 0.1–1.0)
Monocytes Relative: 7 %
NEUTROS ABS: 7.6 10*3/uL (ref 1.7–7.7)
Neutrophils Relative %: 83 %
Platelets: 425 10*3/uL — ABNORMAL HIGH (ref 150–400)
RBC: 3.91 MIL/uL — AB (ref 4.22–5.81)
RDW: 17.3 % — ABNORMAL HIGH (ref 11.5–15.5)
WBC: 9.3 10*3/uL (ref 4.0–10.5)
nRBC: 0.3 % — ABNORMAL HIGH (ref 0.0–0.2)

## 2018-06-30 MED ORDER — SENNOSIDES-DOCUSATE SODIUM 8.6-50 MG PO TABS: 2.0000 | ORAL_TABLET | Freq: Every evening | ORAL | Status: DC | PRN

## 2018-06-30 MED ORDER — SODIUM CHLORIDE 0.9 % IV SOLN
INTRAVENOUS | Status: DC
  Administered 2018-06-30 – 2018-07-02 (×2): via INTRAVENOUS

## 2018-06-30 MED ORDER — APIXABAN 5 MG PO TABS
5.0000 mg | ORAL_TABLET | Freq: Two times a day (BID) | ORAL | Status: DC
  Administered 2018-06-30 – 2018-07-04 (×8): 5 mg via ORAL
  Filled 2018-06-30 (×9): qty 1

## 2018-06-30 MED ORDER — SODIUM CHLORIDE 0.9 % IV SOLN
Freq: Once | INTRAVENOUS | Status: AC
  Administered 2018-06-30: 18 mg via INTRAVENOUS
  Filled 2018-06-30: qty 4

## 2018-06-30 MED ORDER — APIXABAN 5 MG PO TABS: 5.0000 mg | ORAL_TABLET | Freq: Two times a day (BID) | ORAL | Status: DC

## 2018-06-30 MED ORDER — PREDNISONE 20 MG PO TABS
60.0000 mg | ORAL_TABLET | Freq: Every day | ORAL | Status: AC
  Administered 2018-06-30 – 2018-07-04 (×5): 60 mg via ORAL
  Filled 2018-06-30 (×5): qty 3

## 2018-06-30 MED ORDER — VINCRISTINE SULFATE CHEMO INJECTION 1 MG/ML
Freq: Once | INTRAVENOUS | Status: AC
  Administered 2018-06-30: 15:00:00 via INTRAVENOUS
  Filled 2018-06-30: qty 9

## 2018-06-30 MED ORDER — POLYETHYLENE GLYCOL 3350 17 G PO PACK
17.0000 g | PACK | Freq: Every day | ORAL | Status: DC
  Administered 2018-07-03 – 2018-07-04 (×2): 17 g via ORAL
  Filled 2018-06-30 (×5): qty 1

## 2018-06-30 NOTE — Progress Notes (Signed)
Ok to tx from labs on 06/24/18 per dr Irene Limbo

## 2018-06-30 NOTE — Progress Notes (Signed)
Calculation and dosing of doxorubicin, etoposide and vincristine verified by Jacalyn Lefevre

## 2018-06-30 NOTE — H&P (Signed)
HEMATOLOGY/ONCOLOGY CONSULTATION NOTE  Date of Service: 06/30/2018  Patient Care Team: Patient, No Pcp Per as PCP - General (General Practice)  CHIEF COMPLAINTS/PURPOSE OF CONSULTATION:  C6 da EPOCH-R treatment of Primary Mediastinal B-Cell NHL  HISTORY OF PRESENTING ILLNESS:   Nicholas Moreno is a wonderful 23 y.o. male who has been admitted today for C6 dose adjusted EPOCH-R treatment of his Primary Mediastinal B-Cell NHL. The pt reports that he is doing well overall.   The pt reports that he has not developed any new concerns in the interim. He denies any fevers, chills, night sweats or concerns for infections. The pt notes that he has continued eating well. He denies any leg or arm swelling or SOB.  Lab results today (06/30/18) of CBC w/diff and CMP is as follows: all values are WNL except for RBC at 3.91, HGB at 10.9, HCT at 34.6, RDW at 17.3, PLT at 425k, nRBC at 0.3%, Glucose at 100, Total Protein at 6.4, ALT at 52.  On review of systems, pt reports eating well, good energy levels, and denies fevers, chills, night sweats, SOB, leg swelling, arm swelling, abdominal pains, and any other symptoms.  MEDICAL HISTORY:  No past medical history on file.  SURGICAL HISTORY: Past Surgical History:  Procedure Laterality Date   IR IMAGING GUIDED PORT INSERTION  04/22/2018   MEDIASTINOSCOPY N/A 03/10/2018   Procedure: MEDIASTINOSCOPY;  Surgeon: Ivin Poot, MD;  Location: [place];  Service: Thoracic;  Laterality: N/A;   VIDEO ASSISTED THORACOSCOPY (VATS)/ LYMPH NODE SAMPLING Left 03/10/2018   Procedure: VIDEO ASSISTED THORACOSCOPY (VATS)/ BIOSPY ANTERIOR APPROACH;  Surgeon: Ivin Poot, MD;  Location: [place];  Service: Thoracic;  Laterality: Left;   VIDEO BRONCHOSCOPY Left 03/10/2018   Procedure: VIDEO BRONCHOSCOPY;  Surgeon: Prescott Gum, Collier Salina, MD;  Location: [place];  Service: Thoracic;  Laterality: Left;    SOCIAL HISTORY: Social History   Socioeconomic History   Marital  status: Single    Spouse name: Not on file   Number of children: Not on file   Years of education: Not on file   Highest education level: Not on file  Occupational History   Not on file  Social Needs   Financial resource strain: Not on file   Food insecurity:    Worry: Not on file    Inability: Not on file   Transportation needs:    Medical: Not on file    Non-medical: Not on file  Tobacco Use   Smoking status: Never Smoker   Smokeless tobacco: Never Used  Substance and Sexual Activity   Alcohol use: Not Currently   Drug use: Not Currently   Sexual activity: Not on file  Lifestyle   Physical activity:    Days per week: Not on file    Minutes per session: Not on file   Stress: Not on file  Relationships   Social connections:    Talks on phone: Not on file    Gets together: Not on file    Attends religious service: Not on file    Active member of club or organization: Not on file    Attends meetings of clubs or organizations: Not on file    Relationship status: Not on file   Intimate partner violence:    Fear of current or ex partner: Not on file    Emotionally abused: Not on file    Physically abused: Not on file    Forced sexual activity: Not on file  Other  Topics Concern   Not on file  Social History Narrative   Not on file    FAMILY HISTORY: No family history on file.  ALLERGIES:  has No Known Allergies.  MEDICATIONS:  Current Facility-Administered Medications  Medication Dose Route Frequency Provider Last Rate Last Dose   0.9 %  sodium chloride infusion   Intravenous Continuous Brunetta Genera, MD 20 mL/hr at 06/30/18 1205     apixaban (ELIQUIS) tablet 5 mg  5 mg Oral BID Minda Ditto, RPH       DOXOrubicin (ADRIAMYCIN) 18 mg, etoposide (VEPESID) 94 mg, vinCRIStine (ONCOVIN) 0.6 mg in sodium chloride 0.9 % 1,000 mL chemo infusion   Intravenous Once Brunetta Genera, MD       ondansetron (ZOFRAN) 8 mg, dexamethasone  (DECADRON) 10 mg in sodium chloride 0.9 % 50 mL IVPB   Intravenous Once Brunetta Genera, MD       polyethylene glycol (MIRALAX / GLYCOLAX) packet 17 g  17 g Oral Daily Brunetta Genera, MD       predniSONE (DELTASONE) tablet 60 mg  60 mg Oral QAC breakfast Brunetta Genera, MD   60 mg at 06/30/18 1229   senna-docusate (Senokot-S) tablet 2 tablet  2 tablet Oral QHS PRN Brunetta Genera, MD        REVIEW OF SYSTEMS:    10 Point review of Systems was done is negative except as noted above.  PHYSICAL EXAMINATION: ECOG PERFORMANCE STATUS: 0 - Asymptomatic  . Vitals:   06/30/18 1106 06/30/18 1400  BP: 105/64 97/66  Pulse: 99 81  Resp: 16 16  Temp: 97.7 F (36.5 C) 98.2 F (36.8 C)  SpO2: 98% 100%   There were no vitals filed for this visit. .There is no height or weight on file to calculate BMI.  GENERAL:alert, in no acute distress and comfortable SKIN: no acute rashes, no significant lesions EYES: conjunctiva are pink and non-injected, sclera anicteric OROPHARYNX: MMM, no exudates, no oropharyngeal erythema or ulceration NECK: supple, no JVD LYMPH:  no palpable lymphadenopathy in the cervical, axillary or inguinal regions LUNGS: clear to auscultation b/l with normal respiratory effort HEART: regular rate & rhythm ABDOMEN:  normoactive bowel sounds , non tender, not distended. Extremity: no pedal edema PSYCH: alert & oriented x 3 with fluent speech NEURO: no focal motor/sensory deficits  LABORATORY DATA:  I have reviewed the data as listed  . CBC Latest Ref Rng & Units 06/30/2018 06/24/2018 06/13/2018  WBC 4.0 - 10.5 K/uL 9.3 29.2(H) 3.9(L)  Hemoglobin 13.0 - 17.0 g/dL 10.9(L) 11.1(L) 11.0(L)  Hematocrit 39.0 - 52.0 % 34.6(L) 35.7(L) 35.4(L)  Platelets 150 - 400 K/uL 425(H) 185 412(H)    . CMP Latest Ref Rng & Units 06/30/2018 06/24/2018 06/13/2018  Glucose 70 - 99 mg/dL 100(H) 92 100(H)  BUN 6 - 20 mg/dL 11 8 11   Creatinine 0.61 - 1.24 mg/dL 0.71 0.78  0.71  Sodium 135 - 145 mmol/L 138 141 138  Potassium 3.5 - 5.1 mmol/L 4.0 4.0 3.5  Chloride 98 - 111 mmol/L 103 105 104  CO2 22 - 32 mmol/L 27 23 28   Calcium 8.9 - 10.3 mg/dL 9.0 9.3 9.1  Total Protein 6.5 - 8.1 g/dL 6.4(L) 6.8 6.4(L)  Total Bilirubin 0.3 - 1.2 mg/dL 0.5 0.3 1.0  Alkaline Phos 38 - 126 U/L 108 146(H) 55  AST 15 - 41 U/L 24 22 12(L)  ALT 0 - 44 U/L 52(H) 79(H) 14     RADIOGRAPHIC  STUDIES: I have personally reviewed the radiological images as listed and agreed with the findings in the report. 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 EXAM: NUCLEAR MEDICINE PET SKULL BASE TO THIGH TECHNIQUE: 6.5 mCi F-18 FDG was injected intravenously. Full-ring PET imaging was performed from the skull base to thigh after the radiotracer. CT data was obtained and used for attenuation correction and anatomic localization. Fasting blood glucose: 93 mg/dl COMPARISON:  Multiple exams, including 04/03/2018 FINDINGS: Mediastinal blood pool activity: SUV max 1.6 Background hepatic activity SUV max: 2.6 NECK: No significant abnormal hypermetabolic activity in this region. 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 CHEST: Primarily cystic and part solid mediastinal mass with some irregular calcifications noted, extending into both the left chest in the right chest in the paramediastinal regions as on the prior exam, region of involvement measuring approximately 15.6 cm transverse, previously 17.3 cm, and generally with moderate reduction in overall volume. Focal activity along the mediastinal component of the mass has appreciably reduced, with a maximum SUV of 2.8 and previously 4.4. This continues to represent Deauville 4 activity although has clearly improved. The hemithoracic component of the mass measures 2.7 by 3.7 and formerly 4.6 by 3.3 cm, maximum SUV 2.6 today and  previously 5.2. Incidental CT findings: Right Port-A-Cath tip: Cavoatrial junction. ABDOMEN/PELVIS: No significant abnormal hypermetabolic activity in this region. Incidental CT findings: none SKELETON: Diffuse accentuated marrow activity without focal involvement. Incidental CT findings: none IMPRESSION: 1. Further reduction in size of the mediastinal and paramediastinal mass. Currently there is residual Deauville 4 activity. 2. Diffuse accentuated marrow activity without focal involvement. This could well be from granulocyte stimulation. 3. 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 Electronically Signed   By: Van Clines M.D.   On: 06/05/2018 11:45    ASSESSMENT & PLAN:  23 y.o. male with  #1Recently diagnosed Primary Mediastinal B cell Non Hodgkins lymphoma c-MYC negative Not a double hit lymphoma Pathology confirmed by pathology read at [facility].  #2 s/p SVC compression due to mediastinal mass without overt SVC syndrome clinical symptoms #3 small acute distal left upper lobe pulmonary embolus-on lovenox #4 Acute Left IJ and Subclavian DVT due to left sided venous compression due to mass and due to malignancy -wason lovenox for malignancy related thrombosis-- now changed to Eliquis #4 left-sided pleural effusion and left-sided lung atelectasis due to airway compression from mediastinal mass -resolved on CXR   PLAN: -Discussed pt labwork today, 06/30/18; blood counts and chemistries are stable -The pt has no prohibitive toxicities from continuing C6D1 dose adjusted EPOCH-R at this time.   -chemotherapy orders placed, reviewed and signed. -Continue eating well, staying hydrated, and staying as active as reasonably possible -Will complete repeat PET/CT 4-6 weeks after completing C6 dose escalated EPOCH-R -Continue Eliquis 5mg  po BID  -Salt and baking soda mouthwashes 4-5 times per dayto minimize mucositis. -Outpatient  Rituxan and Neulasta on 07/07/18 -Will see the pt back in clinic on 07/14/18   All of the patients questions were answered with apparent satisfaction. The patient knows to call the clinic with any problems, questions or concerns.      Sullivan Lone MD [place] AAHIVMS [facility] Hematology/Oncology Physician [facility]  (Office):       [phone] (Work cell):  [phone] (Fax):  [phone]  06/30/2018 2:02 PM  I, Baldwin [place], am acting as a scribe for Dr. Sullivan Lone.   .I have reviewed the above documentation for accuracy and completeness, and I agree with the above. Sullivan Lone MD MS

## 2018-07-01 LAB — BASIC METABOLIC PANEL
Anion gap: 8 (ref 5–15)
BUN: 12 mg/dL (ref 6–20)
CO2: 24 mmol/L (ref 22–32)
Calcium: 9.2 mg/dL (ref 8.9–10.3)
Chloride: 107 mmol/L (ref 98–111)
Creatinine, Ser: 0.69 mg/dL (ref 0.61–1.24)
GFR calc Af Amer: >60 mL/min (ref >60)
GFR calc non Af Amer: >60 mL/min (ref >60)
Glucose, Bld: 113 mg/dL — ABNORMAL HIGH (ref 70–99)
Potassium: 3.8 mmol/L (ref 3.5–5.1)
SODIUM: 139 mmol/L (ref 135–145)

## 2018-07-01 LAB — CBC
HCT: 33.7 % — ABNORMAL LOW (ref 39.0–52.0)
Hemoglobin: 10.7 g/dL — ABNORMAL LOW (ref 13.0–17.0)
MCH: 27.9 pg (ref 26.0–34.0)
MCHC: 31.8 g/dL (ref 30.0–36.0)
MCV: 88 fL (ref 80.0–100.0)
Platelets: 432 10*3/uL — ABNORMAL HIGH (ref 150–400)
RBC: 3.83 MIL/uL — AB (ref 4.22–5.81)
RDW: 17.2 % — ABNORMAL HIGH (ref 11.5–15.5)
WBC: 18.3 10*3/uL — ABNORMAL HIGH (ref 4.0–10.5)
nRBC: 0.2 % (ref 0.0–0.2)

## 2018-07-01 MED ORDER — SODIUM CHLORIDE 0.9 % IV SOLN
Freq: Once | INTRAVENOUS | Status: AC
  Administered 2018-07-01: 18 mg via INTRAVENOUS
  Filled 2018-07-01: qty 4

## 2018-07-01 MED ORDER — VINCRISTINE SULFATE CHEMO INJECTION 1 MG/ML
Freq: Once | INTRAVENOUS | Status: AC
  Administered 2018-07-01: 13:00:00 via INTRAVENOUS
  Filled 2018-07-01: qty 9

## 2018-07-01 NOTE — Progress Notes (Signed)
Chemo dosages and calculations verified by 2 RNs

## 2018-07-01 NOTE — Progress Notes (Addendum)
HEMATOLOGY/ONCOLOGY INPATIENT PROGRESS NOTE  Date of Service: 07/01/2018  Inpatient Attending: .Brunetta Genera, MD   SUBJECTIVE:   Nicholas Moreno reports that he is doing well overall.   The pt reports that he has continued to enjoy good energy levels and has been eating well and staying well hydrated. He denies diarrhea, constipation, abdominal pains or headaches.   Lab results today (07/01/18) of CBC and CMP is as follows: all values are WNL except for WBC 18.3, RBC at 3.83, HGB at 10.7, HCT at 33.7, RDW at 17.2,, platelets 432,000, potassium 3.4, Glucose 113.  On review of systems, pt reports good energy levels, eating well, and denies diarrhea, constipation, SOB, headaches, abdominal pains, mouth sores, leg swelling, and any other symptoms.    OBJECTIVE:  NAD  PHYSICAL EXAMINATION: . Vitals:   06/30/18 1106 06/30/18 1400 06/30/18 2028 07/01/18 0620  BP: 105/64 97/66 108/65 (!) 95/50  Pulse: 99 81 97 85  Resp: 16 16 17 16   Temp: 97.7 F (36.5 C) 98.2 F (36.8 C) 98.4 F (36.9 C) 98.1 F (36.7 C)  TempSrc: Oral Oral Oral Oral  SpO2: 98% 100% 99% 97%  Weight: 130 lb 11.2 oz (59.3 kg)     Height: 5\' 6"  (1.676 m)      Filed Weights   06/30/18 1106  Weight: 130 lb 11.2 oz (59.3 kg)   .Body mass index is 21.1 kg/m.  GENERAL:alert, in no acute distress and comfortable SKIN: no acute rashes, no significant lesions EYES: conjunctiva are pink and non-injected, sclera anicteric OROPHARYNX: MMM, no exudate, no oropharyngeal erythema or ulceration NECK: supple, no JVD LYMPH:  no palpable lymphadenopathy in the cervical, axillary or inguinal regions LUNGS: clear to auscultation b/l with normal respiratory effort HEART: regular rate & rhythm ABDOMEN:  normoactive bowel sounds , non tender, not distended. No palpable hepatosplenomegaly.  Extremity: no pedal edema PSYCH: alert & oriented x 3 with fluent speech NEURO: no focal motor/sensory deficits   MEDICAL  HISTORY:  No past medical history on file.  SURGICAL HISTORY: Past Surgical History:  Procedure Laterality Date  . IR IMAGING GUIDED PORT INSERTION  04/22/2018  . MEDIASTINOSCOPY N/A 03/10/2018   Procedure: MEDIASTINOSCOPY;  Surgeon: Ivin Poot, MD;  Location: [place];  Service: Thoracic;  Laterality: N/A;  . VIDEO ASSISTED THORACOSCOPY (VATS)/ LYMPH NODE SAMPLING Left 03/10/2018   Procedure: VIDEO ASSISTED THORACOSCOPY (VATS)/ BIOSPY ANTERIOR APPROACH;  Surgeon: Ivin Poot, MD;  Location: [place];  Service: Thoracic;  Laterality: Left;  Marland Kitchen VIDEO BRONCHOSCOPY Left 03/10/2018   Procedure: VIDEO BRONCHOSCOPY;  Surgeon: Ivin Poot, MD;  Location: [facility] OR;  Service: Thoracic;  Laterality: Left;    SOCIAL HISTORY: Social History   Socioeconomic History  . Marital status: Single    Spouse name: Not on file  . Number of children: Not on file  . Years of education: Not on file  . Highest education level: Not on file  Occupational History  . Not on file  Social Needs  . Financial resource strain: Not on file  . Food insecurity:    Worry: Not on file    Inability: Not on file  . Transportation needs:    Medical: Not on file    Non-medical: Not on file  Tobacco Use  . Smoking status: Never Smoker  . Smokeless tobacco: Never Used  Substance and Sexual Activity  . Alcohol use: Not Currently  . Drug use: Not Currently  . Sexual activity: Not on file  Lifestyle  . Physical activity:    Days per week: Not on file    Minutes per session: Not on file  . Stress: Not on file  Relationships  . Social connections:    Talks on phone: Not on file    Gets together: Not on file    Attends religious service: Not on file    Active member of club or organization: Not on file    Attends meetings of clubs or organizations: Not on file    Relationship status: Not on file  . Intimate partner violence:    Fear of current or ex partner: Not on file    Emotionally abused: Not on file     Physically abused: Not on file    Forced sexual activity: Not on file  Other Topics Concern  . Not on file  Social History Narrative  . Not on file    FAMILY HISTORY: No family history on file.  ALLERGIES:  has No Known Allergies.  MEDICATIONS:  Scheduled Meds: . apixaban  5 mg Oral BID  . 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 polyethylene glycol  17 g Oral Daily  . predniSONE  60 mg Oral QAC breakfast   Continuous Infusions: . sodium chloride 20 mL/hr at 07/01/18 0400   PRN Meds:.senna-docusate  REVIEW OF SYSTEMS:    A 10+ POINT REVIEW OF SYSTEMS WAS OBTAINED including neurology, dermatology, psychiatry, cardiac, respiratory, lymph, extremities, GI, GU, Musculoskeletal, constitutional, breasts, reproductive, HEENT.  All pertinent positives are noted in the HPI.  All others are negative.   LABORATORY DATA:  I have reviewed the data as listed  . CBC Latest Ref Rng & Units 07/01/2018 06/30/2018 06/24/2018  WBC 4.0 - 10.5 K/uL 18.3(H) 9.3 29.2(H)  Hemoglobin 13.0 - 17.0 g/dL 10.7(L) 10.9(L) 11.1(L)  Hematocrit 39.0 - 52.0 % 33.7(L) 34.6(L) 35.7(L)  Platelets 150 - 400 K/uL 432(H) 425(H) 185    . CMP Latest Ref Rng & Units 07/01/2018 06/30/2018 06/24/2018  Glucose 70 - 99 mg/dL 113(H) 100(H) 92  BUN 6 - 20 mg/dL 12 11 8   Creatinine 0.61 - 1.24 mg/dL 0.69 0.71 0.78  Sodium 135 - 145 mmol/L 139 138 141  Potassium 3.5 - 5.1 mmol/L 3.8 4.0 4.0  Chloride 98 - 111 mmol/L 107 103 105  CO2 22 - 32 mmol/L 24 27 23   Calcium 8.9 - 10.3 mg/dL 9.2 9.0 9.3  Total Protein 6.5 - 8.1 g/dL - 6.4(L) 6.8  Total Bilirubin 0.3 - 1.2 mg/dL - 0.5 0.3  Alkaline Phos 38 - 126 U/L - 108 146(H)  AST 15 - 41 U/L - 24 22  ALT 0 - 44 U/L - 52(H) 79(H)     RADIOGRAPHIC STUDIES: Nm Pet Image Restag (ps) Skull Base To Thigh  Result Date: 06/05/2018 CLINICAL DATA:  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 EXAM: NUCLEAR MEDICINE  PET SKULL BASE TO THIGH TECHNIQUE: 6.5 mCi F-18 FDG was injected intravenously. Full-ring PET imaging was performed from the skull base to thigh after the radiotracer. CT data was obtained and used for attenuation correction and anatomic localization. Fasting blood glucose: 93 mg/dl COMPARISON:  Multiple exams, including 04/03/2018 FINDINGS: Mediastinal blood pool activity: SUV max 1.6 Background hepatic activity SUV max: 2.6 NECK: No significant abnormal hypermetabolic activity in this region. 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 CHEST: Primarily cystic and part solid mediastinal mass with some irregular calcifications noted, extending into both the  left chest in the right chest in the paramediastinal regions as on the prior exam, region of involvement measuring approximately 15.6 cm transverse, previously 17.3 cm, and generally with moderate reduction in overall volume. Focal activity along the mediastinal component of the mass has appreciably reduced, with a maximum SUV of 2.8 and previously 4.4. This continues to represent Deauville 4 activity although has clearly improved. The hemithoracic component of the mass measures 2.7 by 3.7 and formerly 4.6 by 3.3 cm, maximum SUV 2.6 today and previously 5.2. Incidental CT findings: Right Port-A-Cath tip: Cavoatrial junction. ABDOMEN/PELVIS: No significant abnormal hypermetabolic activity in this region. Incidental CT findings: none SKELETON: Diffuse accentuated marrow activity without focal involvement. Incidental CT findings: none IMPRESSION: 1. Further reduction in size of the mediastinal and paramediastinal mass. Currently there is residual Deauville 4 activity. 2. Diffuse accentuated marrow activity without focal involvement. This could well be from granulocyte stimulation. 3. 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 Electronically Signed   By: Van Clines M.D.   On:  06/05/2018 11:45    ASSESSMENT & PLAN:  23 y.o. 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 PET/CT on 06/05/2018 showed: Further reduction in size of the mediastinal and paramediastinal mass. Currently there is residual Deauville 4 activity. Diffuse accentuated marrow activity without focal involvement. This could well be from granulocyte stimulation. 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  #2 s/p SVC compression due to mediastinal mass without overt SVC syndrome clinical symptoms #3 small acute distal left upper lobe pulmonary embolus-on lovenox #4 Acute Left IJ and Subclavian DVT due to left sided venous compression due to mass and due to malignancy -wason lovenox for malignancy related thrombosis-- now changed to Eliquis #4 left-sided pleural effusion and left-sided lung atelectasis due to airway compression from mediastinal mass -resolved on CXR   PLAN: -Discussed pt labwork today, 07/01/18; blood counts and chemistries are stable.   -The pt has no prohibitive toxicities from continuing C6D2 dose escalated EPOCH-R at this time.   -Continue eating well, staying hydrated, and staying as active as reasonably possible   -Continue Eliquis 5mg  po BID  -Bowel prophylaxis  -Salt and baking soda mouthwashes 4-5 times per dayto minimize mucositis. -Outpatient neulasta and Rituxan on 07/07/2018 -RTC with dr Irene Limbo with labs on 07/14/2018   The total time spent in the appt was 25 minutes and more than 50% was on counseling and direct patient cares.    Mikey Bussing, DNP, AGPCNP-BC, AOCNP  07/01/2018 8:26 AM   ADDEDNUM  .Patient was Personally and independently interviewed, examined and relevant elements of the history of present illness were reviewed in details and an assessment and plan was created.  All elements of the patient's history of present illness , assessment and plan were discussed in details with Kimber Relic DNP. The above documentation reflects our combined findings assessment and plan.  Sullivan Lone MD MS

## 2018-07-02 ENCOUNTER — Other Ambulatory Visit: Payer: Self-pay

## 2018-07-02 ENCOUNTER — Encounter (HOSPITAL_COMMUNITY): Payer: Self-pay

## 2018-07-02 LAB — CBC
HCT: 33.3 % — ABNORMAL LOW (ref 39.0–52.0)
Hemoglobin: 10.3 g/dL — ABNORMAL LOW (ref 13.0–17.0)
MCH: 27.5 pg (ref 26.0–34.0)
MCHC: 30.9 g/dL (ref 30.0–36.0)
MCV: 88.8 fL (ref 80.0–100.0)
Platelets: 458 10*3/uL — ABNORMAL HIGH (ref 150–400)
RBC: 3.75 MIL/uL — AB (ref 4.22–5.81)
RDW: 17.2 % — ABNORMAL HIGH (ref 11.5–15.5)
WBC: 10.1 10*3/uL (ref 4.0–10.5)
nRBC: 0.2 % (ref 0.0–0.2)

## 2018-07-02 LAB — BASIC METABOLIC PANEL
ANION GAP: 7 (ref 5–15)
BUN: 11 mg/dL (ref 6–20)
CALCIUM: 8.9 mg/dL (ref 8.9–10.3)
CO2: 26 mmol/L (ref 22–32)
Chloride: 107 mmol/L (ref 98–111)
Creatinine, Ser: 0.76 mg/dL (ref 0.61–1.24)
GFR calc Af Amer: >60 mL/min (ref >60)
GFR calc non Af Amer: >60 mL/min (ref >60)
Glucose, Bld: 113 mg/dL — ABNORMAL HIGH (ref 70–99)
Potassium: 3.5 mmol/L (ref 3.5–5.1)
Sodium: 140 mmol/L (ref 135–145)

## 2018-07-02 MED ORDER — VINCRISTINE SULFATE CHEMO INJECTION 1 MG/ML
Freq: Once | INTRAVENOUS | Status: AC
  Administered 2018-07-02: 12:00:00 via INTRAVENOUS
  Filled 2018-07-02: qty 9

## 2018-07-02 MED ORDER — SODIUM CHLORIDE 0.9 % IV SOLN
Freq: Once | INTRAVENOUS | Status: AC
  Administered 2018-07-02: 18 mg via INTRAVENOUS
  Filled 2018-07-02: qty 4

## 2018-07-02 NOTE — Progress Notes (Addendum)
Chemo dosages and dilutions verified by 2 chemo RNs 

## 2018-07-02 NOTE — Progress Notes (Addendum)
HEMATOLOGY/ONCOLOGY INPATIENT PROGRESS NOTE  Date of Service: 07/02/2018  Inpatient Attending: .Brunetta Genera, MD   SUBJECTIVE:   Rasul Decola reports that he is doing well overall.   The pt reports that he has continued to enjoy good energy levels and has been eating well and staying well hydrated. He denies diarrhea, constipation, abdominal pains or headaches.   Lab results today (07/02/18) of CBC and CMP is as follows: all values are WNL except for RBC at 3.75, HGB at 10.3, HCT at 33.3, RDW at 17.2, platelets 458,000, Glucose 113.  On review of systems, pt reports good energy levels, eating well, and denies diarrhea, constipation, SOB, headaches, abdominal pains, mouth sores, leg swelling, and any other symptoms.    OBJECTIVE:  NAD  PHYSICAL EXAMINATION: . Vitals:   07/01/18 0620 07/01/18 1550 07/01/18 2050 07/02/18 0520  BP: (!) 95/50 (!) 105/58 (!) 98/57 (!) 98/55  Pulse: 85 77 75 69  Resp: 16 15 16 16   Temp: 98.1 F (36.7 C) 98.7 F (37.1 C) 98.3 F (36.8 C) 98.1 F (36.7 C)  TempSrc: Oral Oral Oral Oral  SpO2: 97% 99% 100% 100%  Weight:      Height:       Filed Weights   06/30/18 1106  Weight: 130 lb 11.2 oz (59.3 kg)   .Body mass index is 21.1 kg/m.  GENERAL:alert, in no acute distress and comfortable SKIN: no acute rashes, no significant lesions EYES: conjunctiva are pink and non-injected, sclera anicteric OROPHARYNX: MMM, no exudate, no oropharyngeal erythema or ulceration NECK: supple, no JVD LYMPH:  no palpable lymphadenopathy in the cervical, axillary or inguinal regions LUNGS: clear to auscultation b/l with normal respiratory effort HEART: regular rate & rhythm ABDOMEN:  normoactive bowel sounds , non tender, not distended. No palpable hepatosplenomegaly.  Extremity: no pedal edema PSYCH: alert & oriented x 3 with fluent speech NEURO: no focal motor/sensory deficits   MEDICAL HISTORY:  No past medical history on file.  SURGICAL  HISTORY: Past Surgical History:  Procedure Laterality Date   IR IMAGING GUIDED PORT INSERTION  04/22/2018   MEDIASTINOSCOPY N/A 03/10/2018   Procedure: MEDIASTINOSCOPY;  Surgeon: Ivin Poot, MD;  Location: [place];  Service: Thoracic;  Laterality: N/A;   VIDEO ASSISTED THORACOSCOPY (VATS)/ LYMPH NODE SAMPLING Left 03/10/2018   Procedure: VIDEO ASSISTED THORACOSCOPY (VATS)/ BIOSPY ANTERIOR APPROACH;  Surgeon: Ivin Poot, MD;  Location: [place];  Service: Thoracic;  Laterality: Left;   VIDEO BRONCHOSCOPY Left 03/10/2018   Procedure: VIDEO BRONCHOSCOPY;  Surgeon: Ivin Poot, MD;  Location: [facility] OR;  Service: Thoracic;  Laterality: Left;    SOCIAL HISTORY: Social History   Socioeconomic History   Marital status: Single    Spouse name: Not on file   Number of children: Not on file   Years of education: Not on file   Highest education level: Not on file  Occupational History   Not on file  Social Needs   Financial resource strain: Not on file   Food insecurity:    Worry: Not on file    Inability: Not on file   Transportation needs:    Medical: Not on file    Non-medical: Not on file  Tobacco Use   Smoking status: Never Smoker   Smokeless tobacco: Never Used  Substance and Sexual Activity   Alcohol use: Not Currently   Drug use: Not Currently   Sexual activity: Not on file  Lifestyle   Physical activity:    Days  per week: Not on file    Minutes per session: Not on file   Stress: Not on file  Relationships   Social connections:    Talks on phone: Not on file    Gets together: Not on file    Attends religious service: Not on file    Active member of club or organization: Not on file    Attends meetings of clubs or organizations: Not on file    Relationship status: Not on file   Intimate partner violence:    Fear of current or ex partner: Not on file    Emotionally abused: Not on file    Physically abused: Not on file    Forced sexual  activity: Not on file  Other Topics Concern   Not on file  Social History Narrative   Not on file    FAMILY HISTORY: No family history on file.  ALLERGIES:  has No Known Allergies.  MEDICATIONS:  Scheduled Meds:  apixaban  5 mg Oral BID   DOXOrubicin/vinCRIStine/etoposide CHEMO IV infusion for Inpatient CI   Intravenous Once   DOXOrubicin/vinCRIStine/etoposide CHEMO IV infusion for Inpatient CI   Intravenous Once   polyethylene glycol  17 g Oral Daily   predniSONE  60 mg Oral QAC breakfast   Continuous Infusions:  sodium chloride 20 mL/hr at 07/01/18 1541   ondansetron (ZOFRAN) with dexamethasone (DECADRON) IV     PRN Meds:.senna-docusate  REVIEW OF SYSTEMS:    A 10+ POINT REVIEW OF SYSTEMS WAS OBTAINED including neurology, dermatology, psychiatry, cardiac, respiratory, lymph, extremities, GI, GU, Musculoskeletal, constitutional, breasts, reproductive, HEENT.  All pertinent positives are noted in the HPI.  All others are negative.   LABORATORY DATA:  I have reviewed the data as listed  . CBC Latest Ref Rng & Units 07/02/2018 07/01/2018 06/30/2018  WBC 4.0 - 10.5 K/uL 10.1 18.3(H) 9.3  Hemoglobin 13.0 - 17.0 g/dL 10.3(L) 10.7(L) 10.9(L)  Hematocrit 39.0 - 52.0 % 33.3(L) 33.7(L) 34.6(L)  Platelets 150 - 400 K/uL 458(H) 432(H) 425(H)    . CMP Latest Ref Rng & Units 07/02/2018 07/01/2018 06/30/2018  Glucose 70 - 99 mg/dL 113(H) 113(H) 100(H)  BUN 6 - 20 mg/dL 11 12 11   Creatinine 0.61 - 1.24 mg/dL 0.76 0.69 0.71  Sodium 135 - 145 mmol/L 140 139 138  Potassium 3.5 - 5.1 mmol/L 3.5 3.8 4.0  Chloride 98 - 111 mmol/L 107 107 103  CO2 22 - 32 mmol/L 26 24 27   Calcium 8.9 - 10.3 mg/dL 8.9 9.2 9.0  Total Protein 6.5 - 8.1 g/dL - - 6.4(L)  Total Bilirubin 0.3 - 1.2 mg/dL - - 0.5  Alkaline Phos 38 - 126 U/L - - 108  AST 15 - 41 U/L - - 24  ALT 0 - 44 U/L - - 52(H)     RADIOGRAPHIC STUDIES: Nm Pet Image Restag (ps) Skull Base To Thigh  Result Date:  06/05/2018 CLINICAL DATA:  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 EXAM: NUCLEAR MEDICINE PET SKULL BASE TO THIGH TECHNIQUE: 6.5 mCi F-18 FDG was injected intravenously. Full-ring PET imaging was performed from the skull base to thigh after the radiotracer. CT data was obtained and used for attenuation correction and anatomic localization. Fasting blood glucose: 93 mg/dl COMPARISON:  Multiple exams, including 04/03/2018 FINDINGS: Mediastinal blood pool activity: SUV max 1.6 Background hepatic activity SUV max: 2.6 NECK: No significant abnormal hypermetabolic activity in this region. 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 CHEST: Primarily cystic  and part solid mediastinal mass with some irregular calcifications noted, extending into both the left chest in the right chest in the paramediastinal regions as on the prior exam, region of involvement measuring approximately 15.6 cm transverse, previously 17.3 cm, and generally with moderate reduction in overall volume. Focal activity along the mediastinal component of the mass has appreciably reduced, with a maximum SUV of 2.8 and previously 4.4. This continues to represent Deauville 4 activity although has clearly improved. The hemithoracic component of the mass measures 2.7 by 3.7 and formerly 4.6 by 3.3 cm, maximum SUV 2.6 today and previously 5.2. Incidental CT findings: Right Port-A-Cath tip: Cavoatrial junction. ABDOMEN/PELVIS: No significant abnormal hypermetabolic activity in this region. Incidental CT findings: none SKELETON: Diffuse accentuated marrow activity without focal involvement. Incidental CT findings: none IMPRESSION: 1. Further reduction in size of the mediastinal and paramediastinal mass. Currently there is residual Deauville 4 activity. 2. Diffuse accentuated marrow activity without focal involvement. This could well be from granulocyte stimulation. 3. Continued hypo activity in the left vocal  cord probably from left vocal cord paralysis. Electronically Signed   By: Van Clines M.D.   On: 06/05/2018 11:45    ASSESSMENT & PLAN:  23 y.o. 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 PET/CT on 06/05/2018 showed: Further reduction in size of the mediastinal and paramediastinal mass. Currently there is residual Deauville 4 activity. Diffuse accentuated marrow activity without focal involvement. This could well be from granulocyte stimulation. 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  #2 s/p SVC compression due to mediastinal mass without overt SVC syndrome clinical symptoms #3 small acute distal left upper lobe pulmonary embolus-on lovenox #4 Acute Left IJ and Subclavian DVT due to left sided venous compression due to mass and due to malignancy -wason lovenox for malignancy related thrombosis-- now changed to Eliquis #4 left-sided pleural effusion and left-sided lung atelectasis due to airway compression from mediastinal mass -resolved on CXR   PLAN: -Discussed pt labwork today, 07/02/18; blood counts and chemistries are stable.   -The pt has no prohibitive toxicities from continuing C6D3 dose escalated EPOCH-R at this time.   -Continue eating well, staying hydrated, and staying as active as reasonably possible   -Continue Eliquis 5mg  po BID  -Bowel prophylaxis  -Salt and baking soda mouthwashes 4-5 times per dayto minimize mucositis. -Outpatient neulasta and Rituxan on 07/07/2018 -RTC with dr Irene Limbo with labs on 07/14/2018   The total time spent in the appt was 25 minutes and more than 50% was on counseling and direct patient cares.    Mikey Bussing, DNP, AGPCNP-BC, AOCNP  07/02/2018 9:39 AM    ADDENDUM  .Patient was Personally and independently interviewed, examined and relevant  elements of the history of present illness were reviewed in details and an assessment and plan was created. All elements of the patient's history of present illness , assessment and plan were discussed in details with Mikey Bussing DNP. The above documentation reflects our combined findings assessment and plan.  Sullivan Lone MD MS

## 2018-07-03 DIAGNOSIS — E876 Hypokalemia: Secondary | ICD-10-CM

## 2018-07-03 LAB — CBC
HCT: 33.8 % — ABNORMAL LOW (ref 39.0–52.0)
Hemoglobin: 10.5 g/dL — ABNORMAL LOW (ref 13.0–17.0)
MCH: 27.4 pg (ref 26.0–34.0)
MCHC: 31.1 g/dL (ref 30.0–36.0)
MCV: 88.3 fL (ref 80.0–100.0)
Platelets: 518 10*3/uL — ABNORMAL HIGH (ref 150–400)
RBC: 3.83 MIL/uL — ABNORMAL LOW (ref 4.22–5.81)
RDW: 16.8 % — ABNORMAL HIGH (ref 11.5–15.5)
WBC: 6.8 10*3/uL (ref 4.0–10.5)
nRBC: 0 % (ref 0.0–0.2)

## 2018-07-03 LAB — BASIC METABOLIC PANEL
Anion gap: 7 (ref 5–15)
BUN: 9 mg/dL (ref 6–20)
CO2: 27 mmol/L (ref 22–32)
Calcium: 9.1 mg/dL (ref 8.9–10.3)
Chloride: 105 mmol/L (ref 98–111)
Creatinine, Ser: 0.66 mg/dL (ref 0.61–1.24)
GFR calc Af Amer: >60 mL/min (ref >60)
Glucose, Bld: 97 mg/dL (ref 70–99)
Potassium: 3.3 mmol/L — ABNORMAL LOW (ref 3.5–5.1)
Sodium: 139 mmol/L (ref 135–145)

## 2018-07-03 MED ORDER — SODIUM CHLORIDE 0.9 % IV SOLN
Freq: Once | INTRAVENOUS | Status: AC
  Administered 2018-07-03: 18 mg via INTRAVENOUS
  Filled 2018-07-03: qty 4

## 2018-07-03 MED ORDER — POTASSIUM CHLORIDE CRYS ER 20 MEQ PO TBCR
20.0000 meq | EXTENDED_RELEASE_TABLET | Freq: Once | ORAL | Status: AC
  Administered 2018-07-03: 20 meq via ORAL
  Filled 2018-07-03: qty 1

## 2018-07-03 MED ORDER — SODIUM CHLORIDE 0.9 % IV SOLN
900.0000 mg/m2 | Freq: Once | INTRAVENOUS | Status: AC
  Administered 2018-07-04: 1420 mg via INTRAVENOUS
  Filled 2018-07-03: qty 50

## 2018-07-03 MED ORDER — VINCRISTINE SULFATE CHEMO INJECTION 1 MG/ML
Freq: Once | INTRAVENOUS | Status: AC
  Administered 2018-07-03: 13:00:00 via INTRAVENOUS
  Filled 2018-07-03: qty 9

## 2018-07-03 MED ORDER — SODIUM CHLORIDE 0.9 % IV SOLN
Freq: Once | INTRAVENOUS | Status: AC
  Administered 2018-07-04: 36 mg via INTRAVENOUS
  Filled 2018-07-03 (×2): qty 8

## 2018-07-03 NOTE — Progress Notes (Addendum)
HEMATOLOGY/ONCOLOGY INPATIENT PROGRESS NOTE  Date of Service: 07/03/2018  Inpatient Attending: .Brunetta Genera, MD   SUBJECTIVE:   Nicholas Moreno reports that he is doing well overall.   The pt reports that he has continued to enjoy good energy levels and has been eating well and staying well hydrated. He denies diarrhea, constipation, abdominal pains or headaches.   Lab results today (07/03/18) of CBC and CMP is as follows: all values are WNL except for RBC at 3.83, HGB at 10.5, HCT at 33.8, RDW at 16.8, platelets 518,000, potassium 3.3.  On review of systems, pt reports good energy levels, eating well, and denies diarrhea, constipation, SOB, headaches, abdominal pains, mouth sores, leg swelling, and any other symptoms.    OBJECTIVE:  NAD  PHYSICAL EXAMINATION: . Vitals:   07/02/18 2052 07/02/18 2052 07/02/18 2053 07/03/18 0517  BP: 102/65 102/65 102/65 (!) 96/59  Pulse: 72 70 70 63  Resp: 18 18 18 16   Temp: 98.5 F (36.9 C) 98.5 F (36.9 C) 98.5 F (36.9 C) 98 F (36.7 C)  TempSrc: Oral Oral Oral Oral  SpO2: 100% 100% 100% 100%  Weight:      Height:       Filed Weights   06/30/18 1106  Weight: 130 lb 11.2 oz (59.3 kg)   .Body mass index is 21.1 kg/m.  GENERAL:alert, in no acute distress and comfortable SKIN: no acute rashes, no significant lesions EYES: conjunctiva are pink and non-injected, sclera anicteric OROPHARYNX: MMM, no exudate, no oropharyngeal erythema or ulceration NECK: supple, no JVD LYMPH:  no palpable lymphadenopathy in the cervical, axillary or inguinal regions LUNGS: clear to auscultation b/l with normal respiratory effort HEART: regular rate & rhythm ABDOMEN:  normoactive bowel sounds , non tender, not distended. No palpable hepatosplenomegaly.  Extremity: no pedal edema PSYCH: alert & oriented x 3 with fluent speech NEURO: no focal motor/sensory deficits   MEDICAL HISTORY:  History reviewed. No pertinent past medical history.   SURGICAL HISTORY: Past Surgical History:  Procedure Laterality Date  . IR IMAGING GUIDED PORT INSERTION  04/22/2018  . MEDIASTINOSCOPY N/A 03/10/2018   Procedure: MEDIASTINOSCOPY;  Surgeon: Ivin Poot, MD;  Location: [place];  Service: Thoracic;  Laterality: N/A;  . VIDEO ASSISTED THORACOSCOPY (VATS)/ LYMPH NODE SAMPLING Left 03/10/2018   Procedure: VIDEO ASSISTED THORACOSCOPY (VATS)/ BIOSPY ANTERIOR APPROACH;  Surgeon: Ivin Poot, MD;  Location: [place];  Service: Thoracic;  Laterality: Left;  Marland Kitchen VIDEO BRONCHOSCOPY Left 03/10/2018   Procedure: VIDEO BRONCHOSCOPY;  Surgeon: Ivin Poot, MD;  Location: [facility] OR;  Service: Thoracic;  Laterality: Left;    SOCIAL HISTORY: Social History   Socioeconomic History  . Marital status: Single    Spouse name: Not on file  . Number of children: Not on file  . Years of education: Not on file  . Highest education level: Not on file  Occupational History  . Not on file  Social Needs  . Financial resource strain: Not on file  . Food insecurity:    Worry: Not on file    Inability: Not on file  . Transportation needs:    Medical: Not on file    Non-medical: Not on file  Tobacco Use  . Smoking status: Never Smoker  . Smokeless tobacco: Never Used  Substance and Sexual Activity  . Alcohol use: Not Currently  . Drug use: Not Currently  . Sexual activity: Not on file  Lifestyle  . Physical activity:    Days per week:  Not on file    Minutes per session: Not on file  . Stress: Not on file  Relationships  . Social connections:    Talks on phone: Not on file    Gets together: Not on file    Attends religious service: Not on file    Active member of club or organization: Not on file    Attends meetings of clubs or organizations: Not on file    Relationship status: Not on file  . Intimate partner violence:    Fear of current or ex partner: Not on file    Emotionally abused: Not on file    Physically abused: Not on file    Forced  sexual activity: Not on file  Other Topics Concern  . Not on file  Social History Narrative  . Not on file    FAMILY HISTORY: History reviewed. No pertinent family history.  ALLERGIES:  has No Known Allergies.  MEDICATIONS:  Scheduled Meds: . apixaban  5 mg Oral BID  . 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 polyethylene glycol  17 g Oral Daily  . predniSONE  60 mg Oral QAC breakfast   Continuous Infusions: . sodium chloride 20 mL/hr at 07/02/18 1556   PRN Meds:.senna-docusate  REVIEW OF SYSTEMS:    A 10+ POINT REVIEW OF SYSTEMS WAS OBTAINED including neurology, dermatology, psychiatry, cardiac, respiratory, lymph, extremities, GI, GU, Musculoskeletal, constitutional, breasts, reproductive, HEENT.  All pertinent positives are noted in the HPI.  All others are negative.   LABORATORY DATA:  I have reviewed the data as listed  . CBC Latest Ref Rng & Units 07/03/2018 07/02/2018 07/01/2018  WBC 4.0 - 10.5 K/uL 6.8 10.1 18.3(H)  Hemoglobin 13.0 - 17.0 g/dL 10.5(L) 10.3(L) 10.7(L)  Hematocrit 39.0 - 52.0 % 33.8(L) 33.3(L) 33.7(L)  Platelets 150 - 400 K/uL 518(H) 458(H) 432(H)    . CMP Latest Ref Rng & Units 07/03/2018 07/02/2018 07/01/2018  Glucose 70 - 99 mg/dL 97 113(H) 113(H)  BUN 6 - 20 mg/dL 9 11 12   Creatinine 0.61 - 1.24 mg/dL 0.66 0.76 0.69  Sodium 135 - 145 mmol/L 139 140 139  Potassium 3.5 - 5.1 mmol/L 3.3(L) 3.5 3.8  Chloride 98 - 111 mmol/L 105 107 107  CO2 22 - 32 mmol/L 27 26 24   Calcium 8.9 - 10.3 mg/dL 9.1 8.9 9.2  Total Protein 6.5 - 8.1 g/dL - - -  Total Bilirubin 0.3 - 1.2 mg/dL - - -  Alkaline Phos 38 - 126 U/L - - -  AST 15 - 41 U/L - - -  ALT 0 - 44 U/L - - -     RADIOGRAPHIC STUDIES: Nm Pet Image Restag (ps) Skull Base To Thigh  Result Date: 06/05/2018 CLINICAL DATA:  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 EXAM: NUCLEAR MEDICINE PET SKULL BASE TO THIGH TECHNIQUE: 6.5 mCi F-18  FDG was injected intravenously. Full-ring PET imaging was performed from the skull base to thigh after the radiotracer. CT data was obtained and used for attenuation correction and anatomic localization. Fasting blood glucose: 93 mg/dl COMPARISON:  Multiple exams, including 04/03/2018 FINDINGS: Mediastinal blood pool activity: SUV max 1.6 Background hepatic activity SUV max: 2.6 NECK: No significant abnormal hypermetabolic activity in this region. 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 CHEST: Primarily cystic and part solid mediastinal mass with some irregular calcifications noted, extending into both the left chest in the right chest in the paramediastinal regions  as on the prior exam, region of involvement measuring approximately 15.6 cm transverse, previously 17.3 cm, and generally with moderate reduction in overall volume. Focal activity along the mediastinal component of the mass has appreciably reduced, with a maximum SUV of 2.8 and previously 4.4. This continues to represent Deauville 4 activity although has clearly improved. The hemithoracic component of the mass measures 2.7 by 3.7 and formerly 4.6 by 3.3 cm, maximum SUV 2.6 today and previously 5.2. Incidental CT findings: Right Port-A-Cath tip: Cavoatrial junction. ABDOMEN/PELVIS: No significant abnormal hypermetabolic activity in this region. Incidental CT findings: none SKELETON: Diffuse accentuated marrow activity without focal involvement. Incidental CT findings: none IMPRESSION: 1. Further reduction in size of the mediastinal and paramediastinal mass. Currently there is residual Deauville 4 activity. 2. Diffuse accentuated marrow activity without focal involvement. This could well be from granulocyte stimulation. 3. 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 Electronically Signed   By: Van Clines M.D.   On: 06/05/2018 11:45    ASSESSMENT & PLAN:  23 y.o.  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 PET/CT on 06/05/2018 showed: Further reduction in size of the mediastinal and paramediastinal mass. Currently there is residual Deauville 4 activity. Diffuse accentuated marrow activity without focal involvement. This could well be from granulocyte stimulation. 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  #2 s/p SVC compression due to mediastinal mass without overt SVC syndrome clinical symptoms #3 small acute distal left upper lobe pulmonary embolus-on lovenox #4 Acute Left IJ and Subclavian DVT due to left sided venous compression due to mass and due to malignancy -wason lovenox for malignancy related thrombosis-- now changed to Eliquis #4 left-sided pleural effusion and left-sided lung atelectasis due to airway compression from mediastinal mass -resolved on CXR   PLAN: -Discussed pt labwork today, 07/02/18; blood counts and chemistries are stable.   -The pt has no prohibitive toxicities from continuing C6D4 dose escalated EPOCH-R at this time.   -Continue eating well, staying hydrated, and staying as active as reasonably possible   -Continue Eliquis 5mg  po BID  -Bowel prophylaxis  -Salt and baking soda mouthwashes 4-5 times per dayto minimize mucositis. -K-Dur 20 mEq x 1 dose today for hypokalemia. -Outpatient neulasta and Rituxan on 07/07/2018 -RTC with dr Irene Limbo with labs on 07/14/2018   The total time spent in the appt was 25 minutes and more than 50% was on counseling and direct patient cares.    Mikey Bussing, DNP, AGPCNP-BC, AOCNP  07/03/2018 8:47 AM   ADDENDUM  .Patient was Personally and independently interviewed, examined and relevant elements of the history of present illness were reviewed in details and an assessment and plan was created. All  elements of the patient's history of present illness , assessment and plan were discussed in details with Mikey Bussing DNP. The above documentation reflects our combined findings assessment and plan.  Sullivan Lone MD MS

## 2018-07-04 LAB — CBC
HEMATOCRIT: 33.2 % — AB (ref 39.0–52.0)
Hemoglobin: 10.6 g/dL — ABNORMAL LOW (ref 13.0–17.0)
MCH: 27.6 pg (ref 26.0–34.0)
MCHC: 31.9 g/dL (ref 30.0–36.0)
MCV: 86.5 fL (ref 80.0–100.0)
Platelets: 545 10*3/uL — ABNORMAL HIGH (ref 150–400)
RBC: 3.84 MIL/uL — ABNORMAL LOW (ref 4.22–5.81)
RDW: 16.1 % — ABNORMAL HIGH (ref 11.5–15.5)
WBC: 3.8 10*3/uL — ABNORMAL LOW (ref 4.0–10.5)
nRBC: 0 % (ref 0.0–0.2)

## 2018-07-04 LAB — BASIC METABOLIC PANEL
Anion gap: 6 (ref 5–15)
BUN: 12 mg/dL (ref 6–20)
CO2: 27 mmol/L (ref 22–32)
Calcium: 9.1 mg/dL (ref 8.9–10.3)
Chloride: 104 mmol/L (ref 98–111)
Creatinine, Ser: 0.7 mg/dL (ref 0.61–1.24)
GFR calc Af Amer: >60 mL/min (ref >60)
GFR calc non Af Amer: >60 mL/min (ref >60)
Glucose, Bld: 100 mg/dL — ABNORMAL HIGH (ref 70–99)
Potassium: 3.5 mmol/L (ref 3.5–5.1)
Sodium: 137 mmol/L (ref 135–145)

## 2018-07-04 MED ORDER — POLYETHYLENE GLYCOL 3350 17 G PO PACK: 17.0000 g | PACK | Freq: Every day | ORAL | 0 refills | Status: AC | PRN

## 2018-07-04 MED ORDER — HEPARIN SOD (PORK) LOCK FLUSH 100 UNIT/ML IV SOLN
500.0000 [IU] | Freq: Once | INTRAVENOUS | Status: AC
  Administered 2018-07-04: 500 [IU] via INTRAVENOUS
  Filled 2018-07-04: qty 5

## 2018-07-04 MED ORDER — SENNOSIDES-DOCUSATE SODIUM 8.6-50 MG PO TABS: 2.0000 | ORAL_TABLET | Freq: Every evening | ORAL | 0 refills | Status: DC | PRN

## 2018-07-04 NOTE — Progress Notes (Signed)
Chemo dosages and dilutions verified by 2 chemo RNs 

## 2018-07-04 NOTE — Discharge Summary (Addendum)
Discharge Summary  Patient ID: Nicholas Moreno MRN: [id number] DOB/AGE: [birth year] 23 y.o.  Admit date: 06/30/2018 Discharge date: 07/04/2018  Primary Care Physician:  Patient, No Pcp Per  Discharge Diagnoses:  Primary Mediastinal B cell lymphoma admitted for C5 of EPOCH-R  Present on Admission: . 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   polyethylene glycol packet Commonly known as:  MIRALAX / GLYCOLAX Take 17 g by mouth daily as needed. What changed:    when to take this  reasons to take this   senna-docusate 8.6-50 MG tablet Commonly known as:  Senokot-S Take 2 tablets by mouth at bedtime as needed for mild constipation.        Disposition and Follow-up:  -Outpatient Rituxan and Neulasta on 07/07/2018 --Will see pt back in clinic on 07/14/2018 for nadir and toxicity check.  CBC    Component Value Date/Time   WBC 3.8 (L) 07/04/2018 0649   RBC 3.84 (L) 07/04/2018 0649   HGB 10.6 (L) 07/04/2018 0649   HGB 11.1 (L) 06/24/2018 0846   HCT 33.2 (L) 07/04/2018 0649   PLT 545 (H) 07/04/2018 0649   PLT 185 06/24/2018 0846   MCV 86.5 07/04/2018 0649   MCH 27.6 07/04/2018 0649   MCHC 31.9 07/04/2018 0649   RDW 16.1 (H) 07/04/2018 0649   LYMPHSABS 0.9 06/30/2018 1203   MONOABS 0.7 06/30/2018 1203   EOSABS 0.0 06/30/2018 1203   BASOSABS 0.0 06/30/2018 1203   CMP Latest Ref Rng & Units 07/04/2018 07/03/2018 07/02/2018  Glucose 70 - 99 mg/dL 100(H) 97 113(H)  BUN 6 - 20 mg/dL 12 9 11   Creatinine 0.61 - 1.24 mg/dL 0.70 0.66 0.76  Sodium 135 - 145 mmol/L 137 139 140  Potassium 3.5 - 5.1 mmol/L 3.5 3.3(L) 3.5   Chloride 98 - 111 mmol/L 104 105 107  CO2 22 - 32 mmol/L 27 27 26   Calcium 8.9 - 10.3 mg/dL 9.1 9.1 8.9  Total Protein 6.5 - 8.1 g/dL - - -  Total Bilirubin 0.3 - 1.2 mg/dL - - -  Alkaline Phos 38 - 126 U/L - - -  AST 15 - 41 U/L - - -  ALT 0 - 44 U/L - - -    Brief H and P:  For complete details please refer to admission H and P, but in brief, Nicholas Moreno a wonderful 23 y.o.malewho has been admitted today for C6  EPOCH-R Treatment of his Primary Mediastinal B Cell Non Hodgkin's Lymphoma. Hereports thatheis doing well overall.   The pt reportsthat he has not developed any concerns since our last visit in clinic. He endorses good energy levels, has been eating well, and has rested well. He denies any mouth sores or concerns for infections. He notes that he has gained weight in the interim as well.  Lab results (06/30/18) of CBC w/diff and CMP is as follows: all values are WNL except for RBC at 3.91, HGB at 10.9, HCT at 34.6, RDW at 17.3, PLT at 425k, nRBC at 0.3%, Glucose at 100, Total Protein at 6.4, ALT at 52.  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  #2 s/p SVC compression due to mediastinal mass without overt SVC syndrome clinical symptoms #3 small acute distal left upper lobe pulmonary embolus-on lovenox #4 Acute Left IJ and Subclavian DVT due to left sided venous compression due to mass and due to malignancy -wason lovenox for malignancy related thrombosis-- now changed to Eliquis #4 left-sided pleural effusion and left-sided lung atelectasis due to airway compression from mediastinal mass -resolved on CXR   PLAN: -Discussed pt labwork today,07/04/2018;blood counts and chemistries are stable -The pt has no prohibitive toxicities from [employer] this time.   -Continue eating well, staying hydrated, and staying as active as reasonably possible -Chemotherapy orders placed, reviewed and discussed with pharmacist. -Outpatient Rituxan and Neulasta on 07/07/2018 -Continue Eliquis 5mg  po BID  -Salt and baking soda mouthwashes 4-5 times per dayto minimize mucositis. -Will see pt back in clinic on 07/14/18 for toxicity and nadir check  BP (!) 100/50 (BP Location: Left Arm)   Pulse 60   Temp 98.1 F (36.7 C) (Oral)   Resp 16   Ht 5\' 6"  (1.676 m)   Wt 130 lb 11.2 oz (59.3 kg)   SpO2 98%   BMI 21.10 kg/m   GENERAL:alert, in no acute distress and comfortable SKIN: no acute rashes, no significant lesions EYES: conjunctiva are pink and non-injected, sclera anicteric OROPHARYNX: MMM, no exudates, no oropharyngeal erythema or ulceration NECK: supple, no JVD LYMPH:  no palpable lymphadenopathy in the cervical, axillary or inguinal regions LUNGS: clear to auscultation b/l with normal respiratory effort HEART: regular rate & rhythm ABDOMEN:  normoactive bowel sounds , non tender, not distended. 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   Increase activity slowly   Complete by:  As directed       Signed: Mikey Bussing 07/04/2018, 10:00 AM    ADDENDUM  .Patient was Personally and independently interviewed, examined and relevant elements of the  discharge summary were reviewed in details and an assessment and plan was created. All elements of the patient's history of present illness , assessment and plan were discussed in details with Mikey Bussing DNP. The above documentation reflects our combined findings assessment and plan.  Sullivan Lone MD MS TT discharging patient>5mins

## 2018-07-07 ENCOUNTER — Inpatient Hospital Stay: Payer: BLUE CROSS/BLUE SHIELD

## 2018-07-07 ENCOUNTER — Other Ambulatory Visit: Payer: Self-pay

## 2018-07-07 VITALS — BP 106/70 | HR 84 | Temp 97.6°F | Resp 16

## 2018-07-07 DIAGNOSIS — C8528 Mediastinal (thymic) large B-cell lymphoma, lymph nodes of multiple sites: Secondary | ICD-10-CM | POA: Diagnosis not present

## 2018-07-07 DIAGNOSIS — Z7901 Long term (current) use of anticoagulants: Secondary | ICD-10-CM | POA: Diagnosis not present

## 2018-07-07 DIAGNOSIS — Z5112 Encounter for antineoplastic immunotherapy: Secondary | ICD-10-CM | POA: Diagnosis present

## 2018-07-07 DIAGNOSIS — Z86718 Personal history of other venous thrombosis and embolism: Secondary | ICD-10-CM | POA: Diagnosis not present

## 2018-07-07 DIAGNOSIS — Z5189 Encounter for other specified aftercare: Secondary | ICD-10-CM | POA: Diagnosis not present

## 2018-07-07 DIAGNOSIS — Z79899 Other long term (current) drug therapy: Secondary | ICD-10-CM | POA: Diagnosis not present

## 2018-07-07 MED ORDER — ACETAMINOPHEN 325 MG PO TABS
650.0000 mg | ORAL_TABLET | Freq: Once | ORAL | Status: AC
  Administered 2018-07-07: 650 mg via ORAL

## 2018-07-07 MED ORDER — ACETAMINOPHEN 325 MG PO TABS
ORAL_TABLET | ORAL | Status: AC
  Filled 2018-07-07: qty 2

## 2018-07-07 MED ORDER — SODIUM CHLORIDE 0.9 % IV SOLN
Freq: Once | INTRAVENOUS | Status: AC
  Administered 2018-07-07: 08:00:00 via INTRAVENOUS
  Filled 2018-07-07: qty 250

## 2018-07-07 MED ORDER — HEPARIN SOD (PORK) LOCK FLUSH 100 UNIT/ML IV SOLN
500.0000 [IU] | Freq: Once | INTRAVENOUS | Status: AC | PRN
  Administered 2018-07-07: 500 [IU]
  Filled 2018-07-07: qty 5

## 2018-07-07 MED ORDER — DIPHENHYDRAMINE HCL 25 MG PO CAPS
ORAL_CAPSULE | ORAL | Status: AC
  Filled 2018-07-07: qty 2

## 2018-07-07 MED ORDER — SODIUM CHLORIDE 0.9 % IV SOLN
375.0000 mg/m2 | Freq: Once | INTRAVENOUS | Status: AC
  Administered 2018-07-07: 600 mg via INTRAVENOUS
  Filled 2018-07-07: qty 10

## 2018-07-07 MED ORDER — PEGFILGRASTIM-CBQV 6 MG/0.6ML ~~LOC~~ SOSY
PREFILLED_SYRINGE | SUBCUTANEOUS | Status: AC
  Filled 2018-07-07: qty 0.6

## 2018-07-07 MED ORDER — DIPHENHYDRAMINE HCL 25 MG PO CAPS
50.0000 mg | ORAL_CAPSULE | Freq: Once | ORAL | Status: AC
  Administered 2018-07-07: 50 mg via ORAL

## 2018-07-07 MED ORDER — SODIUM CHLORIDE 0.9% FLUSH
10.0000 mL | INTRAVENOUS | Status: DC | PRN
  Administered 2018-07-07: 10 mL
  Filled 2018-07-07: qty 10

## 2018-07-07 MED ORDER — PEGFILGRASTIM-CBQV 6 MG/0.6ML ~~LOC~~ SOSY
6.0000 mg | PREFILLED_SYRINGE | Freq: Once | SUBCUTANEOUS | Status: AC
  Administered 2018-07-07: 6 mg via SUBCUTANEOUS

## 2018-07-07 NOTE — Patient Instructions (Signed)
[facility] Discharge Instructions for Patients Receiving Chemotherapy  Today you received the following chemotherapy agents :  Rituxan,  Udenyca.  To help prevent nausea and vomiting after your treatment, we encourage you to take your nausea medication as prescribed.   If you develop nausea and vomiting that is not controlled by your nausea medication, call the clinic.   BELOW ARE SYMPTOMS THAT SHOULD BE REPORTED IMMEDIATELY:  *FEVER GREATER THAN 100.5 F  *CHILLS WITH OR WITHOUT FEVER  NAUSEA AND VOMITING THAT IS NOT CONTROLLED WITH YOUR NAUSEA MEDICATION  *UNUSUAL SHORTNESS OF BREATH  *UNUSUAL BRUISING OR BLEEDING  TENDERNESS IN MOUTH AND THROAT WITH OR WITHOUT PRESENCE OF ULCERS  *URINARY PROBLEMS  *BOWEL PROBLEMS  UNUSUAL RASH Items with * indicate a potential emergency and should be followed up as soon as possible.  Feel free to call the clinic should you have any questions or concerns. The clinic phone number is [phone].  Please show the [facility] at check-in to the Emergency Department and triage nurse.

## 2018-07-11 NOTE — Progress Notes (Signed)
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 Patient notes that he started feeling unwell in August when he noted gradually increasing fatigue and weight loss.  Patient notes subsequently in September and October he developed new cough with progressive shortness of breath which triggered multiple visits with his primary care physician and was treated symptomatically and with inhalers.  Patient reports no imaging studies at the time.  Patient reports he has felt poorly during the last 2 to 3 months and during his recent visits to the [place] in [place]. He reports a total unintentional weight loss of close to 20 pounds. He also reports drenching night sweats over the last 4 to 6 weeks.  He reports that his cough and shortness of breath progressively got much more severe which led to him presenting to the emergency room yesterday. Notes nonproductive cough. No overt fevers.  Patient had a CTA of the chest on 03/07/2018 which showed  "Small acute pulmonary emboli identified within the distal left upper lobe lobar pulmonary artery. 2. 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 Primary differential considerations include lymphoma and leukemia as well as malignant derm cell tumors.  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 Narrowing and displacement of the trachea with complete occlusion of the upper lobe bronchi. There is also marked narrowing of bilateral upper lobe pulmonary arteries. 3. Significantly diminished aeration to both upper lobes, left greater than right. 4. Moderate left pleural effusion."  Patient is currently on room air and saturating 99% with stable hemodynamics. He has been started on IV heparin due to his small pulmonary embolism. 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  No headaches.  No facial swelling.  No upper extremity swelling.  No overt clinical symptomatology of SVC syndrome despite radiographic findings of some SVC narrowing.  No overt chest pain at this time.  Interval History:   [name] returns today for management and evaluation of his Newly diagnosed Primary mediastinal B cell Non hodgkins lymphoma. I last saw the pt on 07/04/18 with [place] discharge. The pt reports that he is doing well overall.   The pt reports that he has not developed any new concerns in the interim. The pt notes that he tolerated C6 well and denies mouth sores, skin rashes, diarrhea, constipation, and difficulty breathing.   The pt continues on Eliquis and denies leg swelling or SOB. The pt has been staying active since his last infusion, and notes that he has been enjoying spending time gardening with his parents.  Lab results today (07/14/18) of CBC w/diff and CMP is as follows: all values are WNL except for  WBC at 12.0k, RBC at 3.97, HGB at 10.7, HCT at 35.4, RDW at 17.2, nRBC at 1.0%, ANC at 8.6k, Monocytes abs at 1.5k, Basophils abs at 200, Abs immature granulocytes at 0.83k, Potassium at  3.4, ALT at 96.  On review of systems, pt reports good energy levels, eating well, and denies mouth sores, skin rashes, SOB, diarrhea, constipation, abdominal pains, and any other symptoms.   MEDICAL HISTORY:  No past medical history on file.  SURGICAL HISTORY: Past Surgical History:  Procedure Laterality Date   IR IMAGING GUIDED PORT INSERTION  04/22/2018   MEDIASTINOSCOPY N/A 03/10/2018   Procedure: MEDIASTINOSCOPY;  Surgeon: Ivin Poot, MD;  Location: [facility];  Service: Thoracic;  Laterality: N/A;   VIDEO ASSISTED THORACOSCOPY (VATS)/ LYMPH NODE SAMPLING Left 03/10/2018   Procedure: VIDEO ASSISTED THORACOSCOPY (VATS)/ BIOSPY ANTERIOR APPROACH;  Surgeon: Ivin Poot, MD;  Location: [facility];  Service: Thoracic;  Laterality: Left;   VIDEO BRONCHOSCOPY Left 03/10/2018   Procedure: VIDEO BRONCHOSCOPY;  Surgeon: Prescott Gum, Collier Salina, MD;  Location: [facility];  Service: Thoracic;  Laterality: Left;    SOCIAL HISTORY: Social History   Socioeconomic History   Marital status: Single    Spouse name: Not on file   Number of children: Not on file   Years of education: Not on file   Highest education level: Not on file  Occupational History   Not on file  Social Needs   Financial resource strain: Not on file   Food insecurity:    Worry: Not on file    Inability: Not on file   Transportation needs:    Medical: Not on file    Non-medical: Not on file  Tobacco Use   Smoking status: Never Smoker   Smokeless tobacco: Never Used  Substance and Sexual Activity   Alcohol use: Not Currently   Drug use: Not Currently   Sexual activity: Not on file  Lifestyle   Physical activity:    Days per week: Not on file    Minutes per session: Not on file   Stress: Not on file  Relationships   Social connections:    Talks on phone: Not on file    Gets together: Not on file    Attends religious service: Not on file    Active member of club or organization: Not on file    Attends  meetings of clubs or organizations: Not on file    Relationship status: Not on file   Intimate partner violence:    Fear of current or ex partner: Not on file    Emotionally abused: Not on file    Physically abused: Not on file    Forced sexual activity: Not on file  Other Topics Concern   Not on file  Social History Narrative   Not on file    FAMILY HISTORY: No family history on file.  ALLERGIES:  has No Known Allergies.  MEDICATIONS:  Current Outpatient Medications  Medication Sig Dispense Refill   apixaban (ELIQUIS) 5 MG TABS tablet Take 1 tablet (5 mg total) by mouth 2 (two) times daily. 60 tablet 4   polyethylene glycol (MIRALAX / GLYCOLAX) packet Take 17 g by mouth daily as needed. 14 each 0   senna-docusate (SENOKOT-S) 8.6-50 MG tablet Take 2 tablets by mouth at bedtime as needed for mild constipation. 60 tablet 0   No current facility-administered medications for this visit.     REVIEW OF SYSTEMS:    A 10+ POINT REVIEW OF  SYSTEMS WAS OBTAINED including neurology, dermatology, psychiatry, cardiac, respiratory, lymph, extremities, GI, GU, Musculoskeletal, constitutional, breasts, reproductive, HEENT.  All pertinent positives are noted in the HPI.  All others are negative.   PHYSICAL EXAMINATION: ECOG PERFORMANCE STATUS: 1 - Symptomatic but completely ambulatory  . Vitals:   07/14/18 0842  BP: 106/64  Pulse: 93  Resp: 18  Temp: 98.2 F (36.8 C)  SpO2: 100%   Filed Weights   07/14/18 0842  Weight: 132 lb 6.4 oz (60.1 kg)   .Body mass index is 21.37 kg/m.  GENERAL:alert, in no acute distress and comfortable SKIN: no acute rashes, no significant lesions EYES: conjunctiva are pink and non-injected, sclera anicteric OROPHARYNX: MMM, no exudates, no oropharyngeal erythema or ulceration NECK: supple, no JVD LYMPH:  no palpable lymphadenopathy in the cervical, axillary or inguinal regions LUNGS: clear to auscultation b/l with normal respiratory  effort HEART: regular rate & rhythm ABDOMEN:  normoactive bowel sounds , non tender, not distended. No palpable hepatosplenomegaly.  Extremity: no pedal edema PSYCH: alert & oriented x 3 with fluent speech NEURO: no focal motor/sensory deficits   LABORATORY DATA:  I have reviewed the data as listed  . CBC Latest Ref Rng & Units 07/14/2018 07/04/2018 07/03/2018  WBC 4.0 - 10.5 K/uL 12.0(H) 3.8(L) 6.8  Hemoglobin 13.0 - 17.0 g/dL 10.7(L) 10.6(L) 10.5(L)  Hematocrit 39.0 - 52.0 % 35.4(L) 33.2(L) 33.8(L)  Platelets 150 - 400 K/uL 172 545(H) 518(H)    . CMP Latest Ref Rng & Units 07/14/2018 07/04/2018 07/03/2018  Glucose 70 - 99 mg/dL 95 100(H) 97  BUN 6 - 20 mg/dL 8 12 9   Creatinine 0.61 - 1.24 mg/dL 0.86 0.70 0.66  Sodium 135 - 145 mmol/L 142 137 139  Potassium 3.5 - 5.1 mmol/L 3.4(L) 3.5 3.3(L)  Chloride 98 - 111 mmol/L 105 104 105  CO2 22 - 32 mmol/L 26 27 27   Calcium 8.9 - 10.3 mg/dL 9.3 9.1 9.1  Total Protein 6.5 - 8.1 g/dL 6.5 - -  Total Bilirubin 0.3 - 1.2 mg/dL 0.3 - -  Alkaline Phos 38 - 126 U/L 119 - -  AST 15 - 41 U/L 22 - -  ALT 0 - 44 U/L 96(H) - -   03/10/18 Biopsy:     03/10/18 Tissue Flow Cytometry:     RADIOGRAPHIC STUDIES: I have personally reviewed the radiological images as listed and agreed with the findings in the report. No results found.  ASSESSMENT & PLAN:  23 y.o. male with #1Recently diagnosed Primary Mediastinal B cell Non Hodgkins lymphoma c-MYC negative Not a double hit lymphoma Pathology confirmed by pathology read at [facility].  S/p 6 cycles of dose adjusted EPOCH-R completed on 07/04/18  #2 s/p SVC compression due to mediastinal mass without overt SVC syndrome clinical symptoms #3 small acute distal left upper lobe pulmonary embolus-on lovenox #4 Acute Left IJ and Subclavian DVT due to left sided venous compression due to mass and due to malignancy -wason lovenox for malignancy related thrombosis-- now changed to Eliquis #4  left-sided pleural effusion and left-sided lung atelectasis due to airway compression from mediastinal mass -resolved on CXR   PLAN: -Discussed pt labwork today, 07/14/18; ALT at 96 likely from chemotherapy. ANC appropriately elevated at 8.6k in setting of G-CSF support. HGB stable at 10.7. Other blood counts and chemistries are stable. -The pt has no prohibitive toxicities from C6 EPOCH-R at this time.  -Expecting blood counts to continue to improve as pt has now completed 6 planned  cycles of dose adjusted EPOCH-R -Proceed with 08/05/18 PET/CT -Will consider referral to [facility] for considerations of consolidative RT, after obtaining PET/CT -Will consider port removal after obtaining PET/CT results -Continue Eliquis 5mg  po BID  -Salt and baking soda mouthwashes 4-5 times per dayto minimize mucositis. -Recommended that the pt continue to eat well, drink at least 48-64 oz of water each day, and walk 20-30 minutes each day.  -Will see the pt back in 4 weeks   F/u for PET/CT as scheduled on 08/05/2018 RTC with Dr Irene Limbo with labs in 4 weeks   All of the patients questions were answered with apparent satisfaction. The patient knows to call the clinic with any problems, questions or concerns.  The total time spent in the appt was 20 minutes and more than 50% was on counseling and direct patient cares.    Sullivan Lone MD MS [facility]AHIVMS [facility] Hematology/Oncology Physician [facility]  (Office):       [phone] (Work cell):  [phone] (Fax):           [phone]  07/14/2018 9:42 AM  I, Baldwin [place], am acting as a scribe for Dr. Sullivan Lone.   .I have reviewed the above documentation for accuracy and completeness, and I agree with the above. Brunetta Genera MD

## 2018-07-14 ENCOUNTER — Inpatient Hospital Stay: Payer: BLUE CROSS/BLUE SHIELD

## 2018-07-14 ENCOUNTER — Other Ambulatory Visit: Payer: Self-pay

## 2018-07-14 ENCOUNTER — Inpatient Hospital Stay: Payer: BLUE CROSS/BLUE SHIELD | Attending: Hematology | Admitting: Hematology

## 2018-07-14 ENCOUNTER — Telehealth: Payer: Self-pay | Admitting: Hematology

## 2018-07-14 VITALS — BP 106/64 | HR 93 | Temp 98.2°F | Resp 18 | Ht 66.0 in | Wt 132.4 lb

## 2018-07-14 DIAGNOSIS — C8529 Mediastinal (thymic) large B-cell lymphoma, extranodal and solid organ sites: Secondary | ICD-10-CM

## 2018-07-14 DIAGNOSIS — Z7901 Long term (current) use of anticoagulants: Secondary | ICD-10-CM | POA: Diagnosis not present

## 2018-07-14 DIAGNOSIS — I82C12 Acute embolism and thrombosis of left internal jugular vein: Secondary | ICD-10-CM | POA: Diagnosis not present

## 2018-07-14 DIAGNOSIS — C8528 Mediastinal (thymic) large B-cell lymphoma, lymph nodes of multiple sites: Secondary | ICD-10-CM

## 2018-07-14 DIAGNOSIS — Z95828 Presence of other vascular implants and grafts: Secondary | ICD-10-CM

## 2018-07-14 LAB — CBC WITH DIFFERENTIAL/PLATELET
Abs Immature Granulocytes: 0.83 10*3/uL — ABNORMAL HIGH (ref 0.00–0.07)
Basophils Absolute: 0.2 10*3/uL — ABNORMAL HIGH (ref 0.0–0.1)
Basophils Relative: 2 %
Eosinophils Absolute: 0.1 10*3/uL (ref 0.0–0.5)
Eosinophils Relative: 1 %
HCT: 35.4 % — ABNORMAL LOW (ref 39.0–52.0)
Hemoglobin: 10.7 g/dL — ABNORMAL LOW (ref 13.0–17.0)
Immature Granulocytes: 7 %
Lymphocytes Relative: 7 %
Lymphs Abs: 0.8 10*3/uL (ref 0.7–4.0)
MCH: 27 pg (ref 26.0–34.0)
MCHC: 30.2 g/dL (ref 30.0–36.0)
MCV: 89.2 fL (ref 80.0–100.0)
Monocytes Absolute: 1.5 10*3/uL — ABNORMAL HIGH (ref 0.1–1.0)
Monocytes Relative: 12 %
Neutro Abs: 8.6 10*3/uL — ABNORMAL HIGH (ref 1.7–7.7)
Neutrophils Relative %: 71 %
Platelets: 172 10*3/uL (ref 150–400)
RBC: 3.97 MIL/uL — ABNORMAL LOW (ref 4.22–5.81)
RDW: 17.2 % — ABNORMAL HIGH (ref 11.5–15.5)
WBC: 12 10*3/uL — ABNORMAL HIGH (ref 4.0–10.5)
nRBC: 1 % — ABNORMAL HIGH (ref 0.0–0.2)

## 2018-07-14 LAB — CMP (CANCER CENTER ONLY)
ALT: 96 U/L — ABNORMAL HIGH (ref 0–44)
AST: 22 U/L (ref 15–41)
Albumin: 3.8 g/dL (ref 3.5–5.0)
Alkaline Phosphatase: 119 U/L (ref 38–126)
Anion gap: 11 (ref 5–15)
BUN: 8 mg/dL (ref 6–20)
CO2: 26 mmol/L (ref 22–32)
Calcium: 9.3 mg/dL (ref 8.9–10.3)
Chloride: 105 mmol/L (ref 98–111)
Creatinine: 0.86 mg/dL (ref 0.61–1.24)
GFR, Est AFR Am: >60 mL/min (ref >60)
GFR, Estimated: >60 mL/min (ref >60)
Glucose, Bld: 95 mg/dL (ref 70–99)
Potassium: 3.4 mmol/L — ABNORMAL LOW (ref 3.5–5.1)
Sodium: 142 mmol/L (ref 135–145)
Total Bilirubin: 0.3 mg/dL (ref 0.3–1.2)
Total Protein: 6.5 g/dL (ref 6.5–8.1)

## 2018-07-14 NOTE — Telephone Encounter (Signed)
Per 4/6 los, appt already scheduled.

## 2018-08-05 ENCOUNTER — Ambulatory Visit (HOSPITAL_COMMUNITY)
Admission: RE | Admit: 2018-08-05 | Discharge: 2018-08-05 | Disposition: A | Discharge status: Home / Self Care | Payer: BLUE CROSS/BLUE SHIELD | Source: Ambulatory Visit | Attending: Hematology | Admitting: Hematology

## 2018-08-05 ENCOUNTER — Other Ambulatory Visit: Payer: Self-pay

## 2018-08-05 DIAGNOSIS — C8528 Mediastinal (thymic) large B-cell lymphoma, lymph nodes of multiple sites: Secondary | ICD-10-CM | POA: Insufficient documentation

## 2018-08-05 LAB — GLUCOSE, CAPILLARY: Glucose-Capillary: 85 mg/dL (ref 70–99)

## 2018-08-05 MED ORDER — FLUDEOXYGLUCOSE F - 18 (FDG) INJECTION
6.6000 | Freq: Once | INTRAVENOUS | Status: AC | PRN
  Administered 2018-08-05: 6.6 via INTRAVENOUS

## 2018-08-08 NOTE — Progress Notes (Signed)
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 Patient notes that he started feeling unwell in August when he noted gradually increasing fatigue and weight loss.  Patient notes subsequently in September and October he developed new cough with progressive shortness of breath which triggered multiple visits with his primary care physician and was treated symptomatically and with inhalers.  Patient reports no imaging studies at the time.  Patient reports he has felt poorly during the last 2 to 3 months and during his recent visits to the [place] in [place]. He reports a total unintentional weight loss of close to 20 pounds. He also reports drenching night sweats over the last 4 to 6 weeks.  He reports that his cough and shortness of breath progressively got much more severe which led to him presenting to the emergency room yesterday. Notes nonproductive cough. No overt fevers.  Patient had a CTA of the chest on 03/07/2018 which showed  "Small acute pulmonary emboli identified within the distal left upper lobe lobar pulmonary artery. 2. 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 Primary differential considerations include lymphoma and leukemia as well as malignant derm cell tumors.  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 Narrowing and displacement of the trachea with complete occlusion of the upper lobe bronchi. There is also marked narrowing of bilateral upper lobe pulmonary arteries. 3. Significantly diminished aeration to both upper lobes, left greater than right. 4. Moderate left pleural effusion."  Patient is currently on room air and saturating 99% with stable hemodynamics. He has been started on IV heparin due to his small pulmonary embolism. 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  No headaches.  No facial swelling.  No upper extremity swelling.  No overt clinical symptomatology of SVC syndrome despite radiographic findings of some SVC narrowing.  No overt chest pain at this time.  Interval History:   Nicholas Moreno returns today for management and evaluation of his Primary mediastinal B cell Non hodgkins lymphoma after completing 6 planned cycles of EPOCH-R. The patient's last visit with [place] was on 07/14/18. The pt reports that he is doing well overall.  The pt reports that he has not developed any new concerns. He denies any testicular pain or swelling and denies any discomfort passing urine. He is enjoying good energy levels. He notes that his voice has completely returned to his baseline. The pt denies any mouth sores or pain along the spine.  Of note since the patient's last visit, pt has had a PET/CT completed on 08/05/18 with results revealing Mild further reduction in size and activity of anterior mediastinal mass, currently Deauville 3 (formerly [employer]  4). 2. Single small foci of accentuated activity along the upper margin of the testicles bilaterally. Although possibly physiologic given the  symmetry, testicular ultrasound is recommended to exclude mass/secondary testicular involvement.  Lab results today (08/11/18) of CBC w/diff and CMP is as follows: all values are WNL except for RDW at 15.7, Eosinophils abs at 600. 08/11/18 LDH at 135  On review of systems, pt reports good energy levels, normalized voice, and denies testicular pain or swelling, discomfort passing urine, mouth sores, pain along the spine, abdominal pains, leg swelling, and any other symptoms.   MEDICAL HISTORY:  No past medical history on file.  SURGICAL HISTORY: Past Surgical History:  Procedure Laterality Date   IR IMAGING GUIDED PORT INSERTION  04/22/2018   MEDIASTINOSCOPY N/A 03/10/2018   Procedure: MEDIASTINOSCOPY;  Surgeon: Ivin Poot, MD;  Location: [facility];  Service: Thoracic;  Laterality: N/A;   VIDEO ASSISTED THORACOSCOPY (VATS)/ LYMPH NODE SAMPLING Left 03/10/2018   Procedure: VIDEO ASSISTED THORACOSCOPY (VATS)/ BIOSPY ANTERIOR APPROACH;  Surgeon: Ivin Poot, MD;  Location: [facility];  Service: Thoracic;  Laterality: Left;   VIDEO BRONCHOSCOPY Left 03/10/2018   Procedure: VIDEO BRONCHOSCOPY;  Surgeon: Prescott Gum, Collier Salina, MD;  Location: [facility];  Service: Thoracic;  Laterality: Left;    SOCIAL HISTORY: Social History   Socioeconomic History   Marital status: Single    Spouse name: Not on file   Number of children: Not on file   Years of education: Not on file   Highest education level: Not on file  Occupational History   Not on file  Social Needs   Financial resource strain: Not on file   Food insecurity:    Worry: Not on file    Inability: Not on file   Transportation needs:    Medical: Not on file    Non-medical: Not on file  Tobacco Use   Smoking status: Never Smoker   Smokeless tobacco: Never Used  Substance and Sexual Activity   Alcohol use: Not Currently   Drug use: Not Currently   Sexual activity: Not on file  Lifestyle   Physical activity:    Days per  week: Not on file    Minutes per session: Not on file   Stress: Not on file  Relationships   Social connections:    Talks on phone: Not on file    Gets together: Not on file    Attends religious service: Not on file    Active member of club or organization: Not on file    Attends meetings of clubs or organizations: Not on file    Relationship status: Not on file   Intimate partner violence:    Fear of current or ex partner: Not on file    Emotionally abused: Not on file    Physically abused: Not on file    Forced sexual activity: Not on file  Other Topics Concern   Not on file  Social History Narrative   Not on file    FAMILY HISTORY: No family history on file.  ALLERGIES:  has No Known Allergies.  MEDICATIONS:  Current Outpatient Medications  Medication Sig Dispense Refill   apixaban (ELIQUIS) 5 MG TABS tablet Take 1 tablet (5 mg total) by mouth 2 (two) times daily. 60 tablet 4   polyethylene glycol (MIRALAX / GLYCOLAX) packet Take 17 g by mouth daily as needed. 14 each 0   senna-docusate (SENOKOT-S) 8.6-50 MG tablet Take 2 tablets by mouth at bedtime as needed for  mild constipation. 60 tablet 0   No current facility-administered medications for this visit.     REVIEW OF SYSTEMS:    A 10+ POINT REVIEW OF SYSTEMS WAS OBTAINED including neurology, dermatology, psychiatry, cardiac, respiratory, lymph, extremities, GI, GU, Musculoskeletal, constitutional, breasts, reproductive, HEENT.  All pertinent positives are noted in the HPI.  All others are negative.   PHYSICAL EXAMINATION: ECOG PERFORMANCE STATUS: 1 - Symptomatic but completely ambulatory  . Vitals:   08/11/18 0930  BP: 121/77  Pulse: 71  Resp: 20  Temp: (!) 97.4 F (36.3 C)  SpO2: 100%   Filed Weights   08/11/18 0930  Weight: 136 lb 12.8 oz (62.1 kg)   .Body mass index is 22.08 kg/m.  GENERAL:alert, in no acute distress and comfortable SKIN: no acute rashes, no significant lesions EYES:  conjunctiva are pink and non-injected, sclera anicteric OROPHARYNX: MMM, no exudates, no oropharyngeal erythema or ulceration NECK: supple, no JVD LYMPH:  no palpable lymphadenopathy in the cervical, axillary or inguinal regions LUNGS: clear to auscultation b/l with normal respiratory effort HEART: regular rate & rhythm ABDOMEN:  normoactive bowel sounds , non tender, not distended. No palpable hepatosplenomegaly.  Extremity: no pedal edema PSYCH: alert & oriented x 3 with fluent speech NEURO: no focal motor/sensory deficits   LABORATORY DATA:  I have reviewed the data as listed  . CBC Latest Ref Rng & Units 08/11/2018 07/14/2018 07/04/2018  WBC 4.0 - 10.5 K/uL 5.7 12.0(H) 3.8(L)  Hemoglobin 13.0 - 17.0 g/dL 14.2 10.7(L) 10.6(L)  Hematocrit 39.0 - 52.0 % 45.1 35.4(L) 33.2(L)  Platelets 150 - 400 K/uL 263 172 545(H)    . CMP Latest Ref Rng & Units 08/11/2018 07/14/2018 07/04/2018  Glucose 70 - 99 mg/dL 83 95 100(H)  BUN 6 - 20 mg/dL 10 8 12   Creatinine 0.61 - 1.24 mg/dL 0.89 0.86 0.70  Sodium 135 - 145 mmol/L 141 142 137  Potassium 3.5 - 5.1 mmol/L 3.9 3.4(L) 3.5  Chloride 98 - 111 mmol/L 105 105 104  CO2 22 - 32 mmol/L 25 26 27   Calcium 8.9 - 10.3 mg/dL 9.3 9.3 9.1  Total Protein 6.5 - 8.1 g/dL 6.6 6.5 -  Total Bilirubin 0.3 - 1.2 mg/dL 0.4 0.3 -  Alkaline Phos 38 - 126 U/L 76 119 -  AST 15 - 41 U/L 18 22 -  ALT 0 - 44 U/L 18 96(H) -   03/10/18 Biopsy:     03/10/18 Tissue Flow Cytometry:     RADIOGRAPHIC STUDIES: I have personally reviewed the radiological images as listed and agreed with the findings in the report. 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 EXAM: NUCLEAR MEDICINE PET SKULL BASE TO THIGH TECHNIQUE: 6.6 mCi F-18 FDG was injected intravenously. Full-ring PET imaging was performed from the skull base to thigh after the radiotracer. CT data was obtained and  used for attenuation correction and anatomic localization. Fasting blood glucose: 85 mg/dl COMPARISON:  Multiple exams, including PET-CT from 06/05/2018 FINDINGS: Mediastinal blood pool activity: SUV max 2.2 Background liver activity: SUV max 3.1 NECK: Symmetric activity along the lower lateral tongue bilaterally, similar to prior and thought to be benign physiologic activity. Incidental CT findings: none CHEST: The anterior mediastinal mass with bilateral paramediastinal extension measures 14.4 by 3.8 cm on image 63/4, previously 15.6 by 4.2 cm when measured in a similar fashion. A left inferior lobular component of the mass measures  3.8 by 3.8 cm on image 72/4, previously 4.3 by 4.1 cm. There is some mixed density of the mass with higher density components medially as before, possibly representing faint calcifications. As on the prior exam, there is evidence of central necrosis in the mass, with mildly accentuated peripheral activity. An anterior component of activity has a maximum SUV of 2.5 (Deauville 3), previously 2.8 (previously Deauville 4). Subjectively the metabolic activity appears mildly improved from previous. No new thoracic mass is identified. There is likely passive atelectasis along the margins of the mass that extend in the left anterior hemithorax. Incidental CT findings: none ABDOMEN/PELVIS: Focal accentuated activity bilaterally along the upper margin of the testicle in the scrotum, roughly similar to prior with maximum SUV of 7.5 on the right and 5.4 on the left. No mass like appearance identified. Incidental CT findings: none SKELETON: No significant abnormal hypermetabolic activity in this region. Incidental CT findings: none IMPRESSION: 1. Mild further reduction in size and activity of anterior mediastinal mass, currently Deauville 3 (formerly Deauville 4). 2. Single small foci of accentuated activity along the upper margin of the testicles bilaterally. Although possibly physiologic given  the symmetry, testicular ultrasound is recommended to exclude mass/secondary testicular involvement. Electronically Signed   By: Van Clines M.D.   On: 08/05/2018 10:52    ASSESSMENT & PLAN:  23 y.o. male with #1Recently diagnosed Primary Mediastinal B cell Non Hodgkins lymphoma c-MYC negative Not a double hit lymphoma Pathology confirmed by pathology read at [facility].  S/p 6 cycles of dose adjusted EPOCH-R completed on 07/04/18  #2 s/p SVC compression due to mediastinal mass without overt SVC syndrome clinical symptoms #3 small acute distal left upper lobe pulmonary embolus-on lovenox #4 Acute Left IJ and Subclavian DVT due to left sided venous compression due to mass and due to malignancy -wason lovenox for malignancy related thrombosis-- now changed to Eliquis #4 left-sided pleural effusion and left-sided lung atelectasis due to airway compression from mediastinal mass -resolved on CXR   PLAN: -Discussed pt labwork today, 08/11/18; HGB improved to 14, blood chemistries are normal. LDH is WNL at 135. -Discussed the 4/282/0 PET/CT which revealed Mild further reduction in size and activity of anterior mediastinal mass, currently Deauville 3 (formerly Deauville 4). 2. Single small foci of accentuated activity along the upper margin of the testicles bilaterally. Although possibly physiologic given the symmetry, testicular ultrasound is recommended to exclude mass/secondary testicular involvement. -Pt denies any concern for testicular pain or swelling or discomfort passing urine. He will let me know in the interim if these develop. -Discussed mediastinal mass as representing Inflammation vs residual disease. Do not feel there is remaining active lymphoma, but could be minimal residual disease. -Discussed that I recommend that the pt discuss the considerations of ISRT to the mediastinal mass with Rad Onc. Pt notes he is open to having a discussion with Rad Onc and I will refer  him. -If pt does not choose to move forward with RT, will monitor with interval PET/CT in 2 months -Will hold off on Port removal until decision is made in regards to RT -Continue Eliquis 5mg  po BID - will need to complete atleast 6 months and be in CR -Recommended that the pt continue to eat well, drink at least 48-64 oz of water each day, and walk 20-30 minutes each day.  -Will see the pt back in 10 weeks -discussed with Dr Isidore Moos and Dr Letta Moynahan  PET/CT in 2 months Labs in 2 months on day  of PET/CT with port flush RTC with Dr Irene Limbo in 10 weeks Radiation oncology referral in 2-3 weeks for consideration of possible ISRT for primary mediastinal large B cell lymphoma    All of the patients questions were answered with apparent satisfaction. The patient knows to call the clinic with any problems, questions or concerns.  The total time spent in the appt was 40 minutes and more than 50% was on counseling and direct patient cares.    Sullivan Lone MD MS AAHIVMS [facility] Hematology/Oncology Physician [facility]  (Office):       [phone] (Work cell):  [phone] (Fax):           [phone]  08/11/2018 10:18 AM  I, Baldwin Jamaica, am acting as a scribe for Dr. Sullivan Lone.   .I have reviewed the above documentation for accuracy and completeness, and I agree with the above. Brunetta Genera MD

## 2018-08-11 ENCOUNTER — Telehealth: Payer: Self-pay | Admitting: Hematology

## 2018-08-11 ENCOUNTER — Other Ambulatory Visit: Payer: Self-pay

## 2018-08-11 ENCOUNTER — Inpatient Hospital Stay: Payer: BLUE CROSS/BLUE SHIELD

## 2018-08-11 ENCOUNTER — Inpatient Hospital Stay: Payer: BLUE CROSS/BLUE SHIELD | Attending: Hematology | Admitting: Hematology

## 2018-08-11 ENCOUNTER — Telehealth: Payer: Self-pay | Admitting: Radiation Oncology

## 2018-08-11 VITALS — BP 121/77 | HR 71 | Temp 97.4°F | Resp 20 | Ht 66.0 in | Wt 136.8 lb

## 2018-08-11 DIAGNOSIS — I2699 Other pulmonary embolism without acute cor pulmonale: Secondary | ICD-10-CM | POA: Diagnosis not present

## 2018-08-11 DIAGNOSIS — C8528 Mediastinal (thymic) large B-cell lymphoma, lymph nodes of multiple sites: Secondary | ICD-10-CM | POA: Diagnosis present

## 2018-08-11 DIAGNOSIS — Z7901 Long term (current) use of anticoagulants: Secondary | ICD-10-CM | POA: Insufficient documentation

## 2018-08-11 DIAGNOSIS — I82C12 Acute embolism and thrombosis of left internal jugular vein: Secondary | ICD-10-CM | POA: Diagnosis not present

## 2018-08-11 DIAGNOSIS — Z95828 Presence of other vascular implants and grafts: Secondary | ICD-10-CM

## 2018-08-11 LAB — CMP (CANCER CENTER ONLY)
ALT: 18 U/L (ref 0–44)
AST: 18 U/L (ref 15–41)
Albumin: 4.2 g/dL (ref 3.5–5.0)
Alkaline Phosphatase: 76 U/L (ref 38–126)
Anion gap: 11 (ref 5–15)
BUN: 10 mg/dL (ref 6–20)
CO2: 25 mmol/L (ref 22–32)
Calcium: 9.3 mg/dL (ref 8.9–10.3)
Chloride: 105 mmol/L (ref 98–111)
Creatinine: 0.89 mg/dL (ref 0.61–1.24)
GFR, Est AFR Am: >60 mL/min (ref >60)
GFR, Estimated: >60 mL/min (ref >60)
Glucose, Bld: 83 mg/dL (ref 70–99)
Potassium: 3.9 mmol/L (ref 3.5–5.1)
Sodium: 141 mmol/L (ref 135–145)
Total Bilirubin: 0.4 mg/dL (ref 0.3–1.2)
Total Protein: 6.6 g/dL (ref 6.5–8.1)

## 2018-08-11 LAB — CBC WITH DIFFERENTIAL/PLATELET
Abs Immature Granulocytes: 0.01 10*3/uL (ref 0.00–0.07)
Basophils Absolute: 0.1 10*3/uL (ref 0.0–0.1)
Basophils Relative: 2 %
Eosinophils Absolute: 0.6 10*3/uL — ABNORMAL HIGH (ref 0.0–0.5)
Eosinophils Relative: 10 %
HCT: 45.1 % (ref 39.0–52.0)
Hemoglobin: 14.2 g/dL (ref 13.0–17.0)
Immature Granulocytes: 0 %
Lymphocytes Relative: 15 %
Lymphs Abs: 0.9 10*3/uL (ref 0.7–4.0)
MCH: 27.5 pg (ref 26.0–34.0)
MCHC: 31.5 g/dL (ref 30.0–36.0)
MCV: 87.2 fL (ref 80.0–100.0)
Monocytes Absolute: 0.4 10*3/uL (ref 0.1–1.0)
Monocytes Relative: 7 %
Neutro Abs: 3.7 10*3/uL (ref 1.7–7.7)
Neutrophils Relative %: 66 %
Platelets: 263 10*3/uL (ref 150–400)
RBC: 5.17 MIL/uL (ref 4.22–5.81)
RDW: 15.7 % — ABNORMAL HIGH (ref 11.5–15.5)
WBC: 5.7 10*3/uL (ref 4.0–10.5)
nRBC: 0 % (ref 0.0–0.2)

## 2018-08-11 LAB — LACTATE DEHYDROGENASE: LDH: 135 U/L (ref 98–192)

## 2018-08-11 NOTE — Telephone Encounter (Signed)
Scheduled appt per 5/4 los. ° °Patient aware of appt date and time. °

## 2018-08-11 NOTE — Progress Notes (Signed)
Lymphoma Location(s) / Histology:  03/10/18 Diagnosis 1. Lung, biopsy, right lower lobe - ATYPICAL LYMPHOID PROLIFERATION, SUSPICIOUS FOR HIGH GRADE B-CELL LYMPHOMA - SEE COMMENT 2. Lung, biopsy, Right Lower - ATYPICAL LYMPHOID PROLIFERATION, SUSPICIOUS FOR HIGH GRADE B-CELL LYMPHOMA - SEE COMMENT 3. Lung, biopsy, Left Upper - ATYPICAL LYMPHOID PROLIFERATION, SUSPICIOUS FOR HIGH GRADE B-CELL LYMPHOMA - SEE COMMENT 4. Mediastinum, biopsy, Mediastinum Mass - ATYPICAL LYMPHOID PROLIFERATION, SUSPICIOUS FOR HIGH GRADE B-CELL LYMPHOMA - SEE COMMENT 5. Mediastinum, biopsy, Mediastinum Mass - ATYPICAL LYMPHOID PROLIFERATION   Nicholas Moreno presented with symptoms of: increasing fatigue in August 2019. He then developed a cough with shortness of breath in September-October 2019. He presented to the ED on 03/07/18 with increased shortness of breath and cough and a CT was performed which revealed a PE and a mediastinal mass.    Biopsies of right lower lung, left upper lung, mediastinum revealed: suspicion of high grade b-cell lymphoma.   Past/Anticipated interventions by medical oncology, if any:  Dr. Irene Limbo 08/11/18 S/p 6 cycles of dose adjusted EPOCH-R completed on 07/04/18 #2 s/p SVC compression due to mediastinal mass without overt SVC syndrome clinical symptoms #3 small acute distal left upper lobe pulmonary embolus-on lovenox #4 Acute Left IJ and Subclavian DVT due to left sided venous compression due to mass and due to malignancy -wason lovenox for malignancy related thrombosis-- now changed to Eliquis #4 left-sided pleural effusion and left-sided lung atelectasis due to airway compression from mediastinal mass -resolved on CXR  PLAN: -Discussed pt labwork today, 08/11/18; HGB improved to 14, blood chemistries are normal. LDH is WNL at 135. -Discussed the 4/282/0 PET/CT which revealed Mild further reduction in size and activity of anterior mediastinal mass, currently Deauville 3  (formerly Deauville 4). 2. Single small foci of accentuated activity along the upper margin of the testicles bilaterally. Although possibly physiologic given the symmetry, testicular ultrasound is recommended to exclude mass/secondary testicular involvement. -Pt denies any concern for testicular pain or swelling or discomfort passing urine. He will let me know in the interim if these develop. -Discussed mediastinal mass as representing Inflammation vs residual disease. Do not feel there is remaining active lymphoma, but could be minimal residual disease. -Discussed that I recommend that the pt discuss the considerations of ISRT to the mediastinal mass with Rad Onc. Pt notes he is open to having a discussion with Rad Onc and I will refer him. -If pt does not choose to move forward with RT, will monitor with interval PET/CT in 2 months -Will hold off on Port removal until decision is made in regards to RT -Continue Eliquis 5mg  po BID  -Recommended that the pt continue to eat well, drink at least 48-64 oz of water each day, and walk 20-30 minutes each day.  -Will see the pt back in 10 weeks   Weight changes, if any, over the past 6 months: He had lost about 20 lbs in the months leading up to his lymphoma diagnosis. He tells me that he has gained his weight back.   Recurrent fevers, or drenching night sweats, if any: Yes, before diagnosis. He denies night sweats now.   SAFETY ISSUES:  Prior radiation? No  Pacemaker/ICD? No  Possible current pregnancy? No  Is the patient on methotrexate? No  Current Complaints / other details:

## 2018-08-11 NOTE — Telephone Encounter (Signed)
New Message:     LVM for a return call to schedule from referral received.

## 2018-08-12 ENCOUNTER — Ambulatory Visit
Admission: RE | Admit: 2018-08-12 | Discharge: 2018-08-12 | Disposition: A | Discharge status: Home / Self Care | Payer: BLUE CROSS/BLUE SHIELD | Source: Ambulatory Visit | Attending: Radiation Oncology | Admitting: Radiation Oncology

## 2018-08-12 ENCOUNTER — Institutional Professional Consult (permissible substitution): Payer: BLUE CROSS/BLUE SHIELD | Admitting: Radiation Oncology

## 2018-08-12 ENCOUNTER — Ambulatory Visit: Payer: BLUE CROSS/BLUE SHIELD

## 2018-08-12 ENCOUNTER — Other Ambulatory Visit: Payer: Self-pay

## 2018-08-12 ENCOUNTER — Encounter: Payer: Self-pay | Admitting: Radiation Oncology

## 2018-08-12 DIAGNOSIS — C8522 Mediastinal (thymic) large B-cell lymphoma, intrathoracic lymph nodes: Secondary | ICD-10-CM

## 2018-08-12 NOTE — Progress Notes (Signed)
Radiation Oncology         [phone] ________________________________  Initial Outpatient Consultation - Conducted via WebEx due to current COVID-19 concerns for limiting patient exposure  Name: Nicholas Moreno MRN: [id number]  Date: 08/12/2018  DOB: May 03, 1995  [id number], No Pcp Per  Brunetta Genera, MD   REFERRING PHYSICIAN: Brunetta Genera, MD  DIAGNOSIS:    ICD-10-CM   1. 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 male who presented with gradually increasing fatigue and weight loss in August 2019. 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 Subsequently in September-October 2019, he developed a cough with progressive shortness of breath which triggered multiple visits to his primary care physician and was treated symptomatically with inhalers. He continued to feel poorly, reporting a total unintentional weight loss of about 20 pounds, as well as drenching night sweats. His cough and shortness of breath progressively got much more severe, which led to his visit to the emergency department on 03/07/2018. He was evaluated with CTA of the chest at that time which showed a small acute pulmonary emboli identified within the distal left upper lobe lobar pulmonary artery. Also identified was a very large partially cavitary anterior mediastinal mass which encased the great vessels and its branches. Marked narrowing of the superior vena cava was noted, and there was associated collateral vessel formation within the left supraclavicular region and paraspinal region. There was narrowing and displacement of the trachea with complete occlusion of the upper lobe  bronchi. There was also marked narrowing of bilateral upper lobe pulmonary arteries. Significantly diminished aeration to both upper lobes was noted, left greater than right, as well as a moderate left pleural effusion.He was started on IV heparin due to his small pulmonary embolism. He also noted that his voice had become softer, suggesting concern for left recurrent laryngeal nerve palsy from his mediastinal mass. He denied any overt clinical symptomatology of SVC syndrome despite radiographic findings of some SVC narrowing. He underwent left bronchoscopy and VATS with Dr. Prescott Gum on 03/10/2018. Final pathology revealed large B-cell lymphoma, most consistent with primary mediastinal (thymic) large B-cell lymphoma. CT scan of the abdomen/pelvis on 03/13/2018 status post VATS showed bilateral pleural effusions, left greater than right, with left lower lobe atelectasis. No lymphadenopathy noted in the abdomen or pelvis.  He began treatment with EPOCH-R on 03/15/2018. Initial PET scan on 04/03/2018 showed marked reduction in volume of the left upper lobe, mediastinal, and right upper lobe bulky masses. The residual mass lesions had mild to moderate peripheral hypermetabolic activity with central necrosis. The anterior mediastinal nodal masses were partially calcified. No evidence of disease progression.   Repeat PET scan on 06/05/2018 showed further reduction in size of the mediastinal and paramediastinal mass. There was diffuse accentuated marrow activity without focal involvement. 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  He completed 6 cycles of dose adjusted EPOCH-R on 07/04/2018. He tolerated the treatment well without any prohibitive toxicities. Follow-up PET scan dated 08/05/2018 showed mild further reduction in size and activity of anterior mediastinal mass, now measuring 14.4 x 3.8 cm (previously 15.6 x 4.2 cm), currently Deauville 3 (formerly Deauville 4).  There was a single small  foci of accentuated activity along the upper margin of the testicles bilaterally. Although possibly physiologic given the symmetry, testicular ultrasound was recommended to exclude mass/secondary testicular involvement.  The patient reviewed the imaging results with Dr. Irene Limbo and has been referred today for consideration of radiation therapy to the mediastinal mass. I have reviewed his imaging personally.  He recently completed college at [employer], statistics major.  He does not vape or smoke. This is his first cancer.  No current fevers or night sweats. Weight has returned to normal baseline, 135lb.  Denies any symptoms now.  PREVIOUS RADIATION THERAPY: No  PAST MEDICAL HISTORY:  has no past medical history on file.    PAST SURGICAL HISTORY: Past Surgical History:  Procedure Laterality Date   IR IMAGING GUIDED PORT INSERTION  04/22/2018   MEDIASTINOSCOPY N/A 03/10/2018   Procedure: MEDIASTINOSCOPY;  Surgeon: Ivin Poot, MD;  Location: [place];  Service: Thoracic;  Laterality: N/A;   VIDEO ASSISTED THORACOSCOPY (VATS)/ LYMPH NODE SAMPLING Left 03/10/2018   Procedure: VIDEO ASSISTED THORACOSCOPY (VATS)/ BIOSPY ANTERIOR APPROACH;  Surgeon: Ivin Poot, MD;  Location: [place];  Service: Thoracic;  Laterality: Left;   VIDEO BRONCHOSCOPY Left 03/10/2018   Procedure: VIDEO BRONCHOSCOPY;  Surgeon: Ivin Poot, MD;  Location: [place];  Service: Thoracic;  Laterality: Left;    FAMILY HISTORY: family history is not on file.  SOCIAL HISTORY:  reports that he has never smoked. He has never used smokeless tobacco. He reports current alcohol use. He reports that he does not use drugs.  ALLERGIES: Patient has no known allergies.  MEDICATIONS:  Current Outpatient Medications  Medication Sig Dispense Refill   apixaban (ELIQUIS) 5 MG TABS tablet Take 1 tablet (5 mg total) by mouth 2 (two) times daily. 60 tablet 4   polyethylene glycol (MIRALAX / GLYCOLAX)  packet Take 17 g by mouth daily as needed. (Patient not taking: Reported on 08/12/2018) 14 each 0   No current facility-administered medications for this encounter.     REVIEW OF SYSTEMS:  Notable for that above.   PHYSICAL EXAM:  vitals were not taken for this visit.   NAD, alert, conversant  ECOG = 0  0 - Asymptomatic (Fully active, able to carry on all predisease activities without restriction)  1 - Symptomatic but completely ambulatory (Restricted in physically strenuous activity but ambulatory and able to carry out work of a light or sedentary nature. For example, light housework, office work)  2 - Symptomatic, <50% in bed during the day (Ambulatory and capable of all self care but unable to carry out any work activities. Up and about more than 50% of waking hours)  3 - Symptomatic, >50% in bed, but not bedbound (Capable of only limited self-care, confined to bed or chair 50% or more of waking hours)  4 - Bedbound (Completely disabled. Cannot carry on any self-care. Totally confined to bed or chair)  5 - Death   Eustace Pen MM, Creech RH, Tormey DC, et al. [phone]). "Toxicity and response criteria of the [employer]". [place] Oncol. 5 (6): 649-55   LABORATORY DATA:  Lab Results  Component Value Date   WBC 5.7 08/11/2018   HGB 14.2 08/11/2018   HCT 45.1 08/11/2018   MCV 87.2 08/11/2018   PLT 263 08/11/2018   CMP     Component Value Date/Time   NA 141 08/11/2018 0831   K 3.9 08/11/2018 0831   CL 105 08/11/2018 0831   CO2 25  08/11/2018 0831   GLUCOSE 83 08/11/2018 0831   BUN 10 08/11/2018 0831   CREATININE 0.89 08/11/2018 0831   CALCIUM 9.3 08/11/2018 0831   PROT 6.6 08/11/2018 0831   ALBUMIN 4.2 08/11/2018 0831   AST 18 08/11/2018 0831   ALT 18 08/11/2018 0831   ALKPHOS 76 08/11/2018 0831   BILITOT 0.4 08/11/2018 0831   GFRNONAA >60 08/11/2018 0831   GFRAA >60 08/11/2018 0831         RADIOGRAPHY: Nm Pet Image Restag (ps) Skull Base To  Thigh  Result Date: 08/05/2018 CLINICAL DATA:  Subsequent treatment strategy for diffuse large B-cell lymphoma. EXAM: NUCLEAR MEDICINE PET SKULL BASE TO THIGH TECHNIQUE: 6.6 mCi F-18 FDG was injected intravenously. Full-ring PET imaging was performed from the skull base to thigh after the radiotracer. CT data was obtained and used for attenuation correction and anatomic localization. Fasting blood glucose: 85 mg/dl COMPARISON:  Multiple exams, including PET-CT from 06/05/2018 FINDINGS: Mediastinal blood pool activity: SUV max 2.2 Background liver activity: SUV max 3.1 NECK: Symmetric activity along the lower lateral tongue bilaterally, similar to prior and thought to be benign physiologic activity. Incidental CT findings: none CHEST: The anterior mediastinal mass with bilateral paramediastinal extension measures 14.4 by 3.8 cm on image 63/4, previously 15.6 by 4.2 cm when measured in a similar fashion. A left inferior lobular component of the mass measures 3.8 by 3.8 cm on image 72/4, previously 4.3 by 4.1 cm. There is some mixed density of the mass with higher density components medially as before, possibly representing faint calcifications. As on the prior exam, there is evidence of central necrosis in the mass, with mildly accentuated peripheral activity. An anterior component of activity has a maximum SUV of 2.5 (Deauville 3), previously 2.8 (previously Deauville 4). Subjectively the metabolic activity appears mildly improved from previous. No new thoracic mass is identified. There is likely passive atelectasis along the margins of the mass that extend in the left anterior hemithorax. Incidental CT findings: none ABDOMEN/PELVIS: Focal accentuated activity bilaterally along the upper margin of the testicle in the scrotum, roughly similar to prior with maximum SUV of 7.5 on the right and 5.4 on the left. No mass like appearance identified. Incidental CT findings: none SKELETON: No significant abnormal  hypermetabolic activity in this region. Incidental CT findings: none IMPRESSION: 1. Mild further reduction in size and activity of anterior mediastinal mass, currently Deauville 3 (formerly Deauville 4). 2. Single small foci of accentuated activity along the upper margin of the testicles bilaterally. Although possibly physiologic given the symmetry, testicular ultrasound is recommended to exclude mass/secondary testicular involvement. Electronically Signed   By: Van Clines M.D.   On: 08/05/2018 10:52      IMPRESSION/PLAN:  Primary Mediastinal B cell lymphoma.  S/p 6 cycles dose adjusted R-EPOCH.  Residual anterior mediastinal mass, on PET last week, currently Deauville 3.   Today, I talked to the patient about the findings and work-up thus far. We discussed the patient's diagnosis and general treatment for this, acknowledging the potential role of radiotherapy in the management. We discussed the available radiation techniques, and focused on the details of logistics and delivery.  We discussed that RT is sometimes held if we are reasonably confident that there is not residual disease; Dr Irene Limbo plans to restage him with another PET in July to ascertain this.  We discussed the risks, benefits, and side effects of radiotherapy. Side effects may include but not necessarily be limited to: esophagitis, fatigue, skin irritation, secondary cancer,  hypothyroidism, injury to lungs and heart. No guarantees of treatment were given. The patient was encouraged to ask questions that I answered to the best of my ability.   Given his young age, I recommend obtaining a second opinion from a pediatric lymphoma subspecialist at [facility], Dr Letta Moynahan.  I have personally discussed his case with Dr Teryl Lucy and Dr Irene Limbo and made a referral.   In the meantime, I do not recommend RT before his PET in July unless Dr Letta Moynahan disagrees; it is possible that we will ultimately be able to spare this gentleman from  further treatment-related toxicities.  This encounter was provided by telemedicine platform Webex.  The patient has given verbal consent for this type of encounter and has been advised to only accept a meeting of this type in a secure network environment. The time spent during this encounter was OVER 15 minutes. The attendants for this meeting include Eppie Gibson  and Felipa Eth.  During the encounter, Eppie Gibson was located at [facility] Radiation Oncology Department.  Hezakiah Champeau was located at home.    __________________________________________   Eppie Gibson, MD  This document serves as a record of services personally performed by Eppie Gibson, MD. It was created on her behalf by Rae Lips, a trained medical scribe. The creation of this record is based on the scribe's personal observations and the provider's statements to them. This document has been checked and approved by the attending provider.

## 2018-08-13 ENCOUNTER — Encounter: Payer: Self-pay | Admitting: Radiation Oncology

## 2018-08-21 ENCOUNTER — Ambulatory Visit: Payer: BLUE CROSS/BLUE SHIELD | Admitting: Hematology

## 2018-10-02 ENCOUNTER — Other Ambulatory Visit: Payer: Self-pay | Admitting: Hematology

## 2018-10-02 DIAGNOSIS — I82622 Acute embolism and thrombosis of deep veins of left upper extremity: Secondary | ICD-10-CM

## 2018-10-13 ENCOUNTER — Ambulatory Visit (HOSPITAL_COMMUNITY)
Admission: RE | Admit: 2018-10-13 | Discharge: 2018-10-13 | Disposition: A | Discharge status: Home / Self Care | Payer: BC Managed Care – PPO | Source: Ambulatory Visit | Attending: Hematology | Admitting: Hematology

## 2018-10-13 ENCOUNTER — Other Ambulatory Visit: Payer: Self-pay

## 2018-10-13 ENCOUNTER — Inpatient Hospital Stay: Payer: BC Managed Care – PPO

## 2018-10-13 ENCOUNTER — Other Ambulatory Visit: Payer: Self-pay | Admitting: Hematology

## 2018-10-13 ENCOUNTER — Inpatient Hospital Stay: Payer: BC Managed Care – PPO | Attending: Hematology

## 2018-10-13 DIAGNOSIS — R0602 Shortness of breath: Secondary | ICD-10-CM | POA: Diagnosis not present

## 2018-10-13 DIAGNOSIS — R05 Cough: Secondary | ICD-10-CM | POA: Diagnosis not present

## 2018-10-13 DIAGNOSIS — Z95828 Presence of other vascular implants and grafts: Secondary | ICD-10-CM

## 2018-10-13 DIAGNOSIS — J90 Pleural effusion, not elsewhere classified: Secondary | ICD-10-CM | POA: Diagnosis not present

## 2018-10-13 DIAGNOSIS — C8528 Mediastinal (thymic) large B-cell lymphoma, lymph nodes of multiple sites: Secondary | ICD-10-CM | POA: Insufficient documentation

## 2018-10-13 DIAGNOSIS — C8520 Mediastinal (thymic) large B-cell lymphoma, unspecified site: Secondary | ICD-10-CM | POA: Diagnosis not present

## 2018-10-13 DIAGNOSIS — Z79899 Other long term (current) drug therapy: Secondary | ICD-10-CM | POA: Insufficient documentation

## 2018-10-13 DIAGNOSIS — R5383 Other fatigue: Secondary | ICD-10-CM | POA: Diagnosis not present

## 2018-10-13 LAB — CMP (CANCER CENTER ONLY)
ALT: 27 U/L (ref 0–44)
AST: 20 U/L (ref 15–41)
Albumin: 3.9 g/dL (ref 3.5–5.0)
Alkaline Phosphatase: 74 U/L (ref 38–126)
Anion gap: 9 (ref 5–15)
BUN: 10 mg/dL (ref 6–20)
CO2: 25 mmol/L (ref 22–32)
Calcium: 9.1 mg/dL (ref 8.9–10.3)
Chloride: 106 mmol/L (ref 98–111)
Creatinine: 0.83 mg/dL (ref 0.61–1.24)
GFR, Est AFR Am: >60 mL/min (ref >60)
GFR, Estimated: >60 mL/min (ref >60)
Glucose, Bld: 93 mg/dL (ref 70–99)
Potassium: 4.1 mmol/L (ref 3.5–5.1)
Sodium: 140 mmol/L (ref 135–145)
Total Bilirubin: 0.5 mg/dL (ref 0.3–1.2)
Total Protein: 6.6 g/dL (ref 6.5–8.1)

## 2018-10-13 LAB — CBC WITH DIFFERENTIAL/PLATELET
Abs Immature Granulocytes: 0 10*3/uL (ref 0.00–0.07)
Basophils Absolute: 0.1 10*3/uL (ref 0.0–0.1)
Basophils Relative: 1 %
Eosinophils Absolute: 0.2 10*3/uL (ref 0.0–0.5)
Eosinophils Relative: 5 %
HCT: 46 % (ref 39.0–52.0)
Hemoglobin: 14.9 g/dL (ref 13.0–17.0)
Immature Granulocytes: 0 %
Lymphocytes Relative: 38 %
Lymphs Abs: 1.7 10*3/uL (ref 0.7–4.0)
MCH: 26.8 pg (ref 26.0–34.0)
MCHC: 32.4 g/dL (ref 30.0–36.0)
MCV: 82.6 fL (ref 80.0–100.0)
Monocytes Absolute: 0.3 10*3/uL (ref 0.1–1.0)
Monocytes Relative: 6 %
Neutro Abs: 2.2 10*3/uL (ref 1.7–7.7)
Neutrophils Relative %: 50 %
Platelets: 298 10*3/uL (ref 150–400)
RBC: 5.57 MIL/uL (ref 4.22–5.81)
RDW: 13.5 % (ref 11.5–15.5)
WBC: 4.4 10*3/uL (ref 4.0–10.5)
nRBC: 0 % (ref 0.0–0.2)

## 2018-10-13 LAB — LACTATE DEHYDROGENASE: LDH: 134 U/L (ref 98–192)

## 2018-10-13 LAB — GLUCOSE, CAPILLARY: Glucose-Capillary: 86 mg/dL (ref 70–99)

## 2018-10-13 MED ORDER — FLUDEOXYGLUCOSE F - 18 (FDG) INJECTION
6.7900 | Freq: Once | INTRAVENOUS | Status: AC | PRN
  Administered 2018-10-13: 6.79 via INTRAVENOUS

## 2018-10-13 MED ORDER — SODIUM CHLORIDE 0.9% FLUSH
10.0000 mL | INTRAVENOUS | Status: DC | PRN
  Administered 2018-10-13: 10 mL
  Filled 2018-10-13: qty 10

## 2018-10-19 NOTE — Progress Notes (Signed)
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 Patient notes that he started feeling unwell in August when he noted gradually increasing fatigue and weight loss.  Patient notes subsequently in September and October he developed new cough with progressive shortness of breath which triggered multiple visits with his primary care physician and was treated symptomatically and with inhalers.  Patient reports no imaging studies at the time.  Patient reports he has felt poorly during the last 2 to 3 months and during his recent visits to the [place] in [place]. He reports a total unintentional weight loss of close to 20 pounds. He also reports drenching night sweats over the last 4 to 6 weeks.  He reports that his cough and shortness of breath progressively got much more severe which led to him presenting to the emergency room yesterday. Notes nonproductive cough. No overt fevers.  Patient had a CTA of the chest on 03/07/2018 which showed  "Small acute pulmonary emboli identified within the distal left upper lobe lobar pulmonary artery. 2. Very large partially cavitary  anterior mediastinal mass is identified which encases the great vessels and its branches. Primary differential considerations include lymphoma and leukemia as well as malignant derm cell tumors. 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 Narrowing and displacement of the trachea with complete occlusion of the upper lobe bronchi. There is also marked narrowing of bilateral upper lobe pulmonary arteries. 3. Significantly diminished aeration to both upper lobes, left greater than right. 4. Moderate left pleural effusion."  Patient is currently on room air and saturating 99% with stable hemodynamics. He has been started on IV heparin due to his small pulmonary embolism. 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  No headaches.  No facial swelling.  No upper extremity swelling.  No overt clinical symptomatology of SVC syndrome despite radiographic findings of some SVC narrowing.  No overt chest pain at this time.   Interval History:   Nicholas Moreno returns today for management and evaluation of his Primary mediastinal B cell Non hodgkins lymphoma after completing 6 planned cycles of EPOCH-R. The patient's last visit with [place] was on 08/11/2018. The pt reports that he is doing well overall.  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    Of note since the patient's last visit, pt has had a PET scan completed on 10/13/2018 with results revealing Continued further reduction in size and metabolism of the  heterogeneous density anterior mediastinal/paramediastinal mass. Persistent Deauville 3 activity. No metabolic evidence of new or  progressive lymphoma.  He also met with Dr. Valora Corporal at [facility] as referred by Dr. Eppie Gibson for recommendations regarding radiation therapy in the management of his primary mediastinal B-cell lymphoma. - adjuvant RT not recommended at this time.  Lab results from 10/13/2018 today (10/20/18) of CBC w/diff and CMP is as follows: all values are WNL.  On review of systems, pt reports an increase in weight by 4 lbs and denies mouth sores and any other symptoms.      MEDICAL HISTORY:  No past medical history on file.  SURGICAL HISTORY: Past Surgical History:  Procedure Laterality Date  . IR IMAGING GUIDED PORT INSERTION  04/22/2018  . MEDIASTINOSCOPY N/A 03/10/2018   Procedure: MEDIASTINOSCOPY;  Surgeon: Ivin Poot, MD;  Location: [facility];  Service: Thoracic;  Laterality: N/A;  . VIDEO ASSISTED THORACOSCOPY (VATS)/ LYMPH NODE SAMPLING Left 03/10/2018   Procedure: VIDEO ASSISTED THORACOSCOPY (VATS)/ BIOSPY ANTERIOR APPROACH;  Surgeon: Ivin Poot, MD;  Location: [facility];  Service: Thoracic;  Laterality: Left;  Marland Kitchen VIDEO BRONCHOSCOPY Left 03/10/2018   Procedure: VIDEO BRONCHOSCOPY;  Surgeon: Ivin Poot, MD;  Location: [facility] OR;  Service: Thoracic;  Laterality: Left;    SOCIAL HISTORY: Social History   Socioeconomic History  . Marital status: Single    Spouse name: Not on file  . Number of children: Not on file  . Years of education: Not on file  . Highest education level: Not on file  Occupational History  . Not on file  Social Needs  . Financial resource strain: Not on file  . Food insecurity    Worry: Not on file    Inability: Not on file  . Transportation needs    Medical: No    Non-medical: No  Tobacco Use  . Smoking status: Never Smoker  . Smokeless tobacco: Never Used  Substance and Sexual Activity  . Alcohol use: Yes    Comment: socially.   . Drug use: Never  . Sexual activity: Not on file  Lifestyle  . Physical activity     Days per week: Not on file    Minutes per session: Not on file  . Stress: Not on file  Relationships  . Social Herbalist on phone: Not on file    Gets together: Not on file    Attends religious service: Not on file    Active member of club or organization: Not on file    Attends meetings of clubs or organizations: Not on file    Relationship status: Not on file  . Intimate partner violence    Fear of current or ex partner: No    Emotionally abused: No    Physically abused: No    Forced sexual activity: No  Other Topics Concern  . Not on file  Social History Narrative  . Not on file    FAMILY HISTORY: No family history on file.   ALLERGIES:  has No Known Allergies.   MEDICATIONS:  Current Outpatient Medications  Medication Sig Dispense Refill  . ELIQUIS 5 MG TABS tablet TAKE 1 TABLET(5 MG) BY MOUTH TWICE DAILY 60 tablet 4  . polyethylene glycol (MIRALAX / GLYCOLAX) packet Take 17 g by mouth daily as needed. (Patient not taking: Reported on 08/12/2018) 14 each 0   No current facility-administered medications for this visit.     REVIEW OF  SYSTEMS:   A 10+ POINT REVIEW OF SYSTEMS WAS OBTAINED including neurology, dermatology, psychiatry, cardiac, respiratory, lymph, extremities, GI, GU, Musculoskeletal, constitutional, breasts, reproductive, HEENT.  All pertinent positives are noted in the HPI.  All others are negative.    PHYSICAL EXAMINATION: ECOG PERFORMANCE STATUS: 1 - Symptomatic but completely ambulatory  Vitals:   10/20/18 0855  BP: 103/63  Pulse: 74  Resp: 18  Temp: 99.1 F (37.3 C)  SpO2: 100%   Filed Weights   10/20/18 0855  Weight: 139 lb 1.6 oz (63.1 kg)   Body mass index is 22.45 kg/m.  GENERAL:alert, in no acute distress and comfortable SKIN: no acute rashes, no significant lesions EYES: conjunctiva are pink and non-injected, sclera anicteric OROPHARYNX: MMM, no exudates, no oropharyngeal erythema or ulceration NECK: supple, no  JVD LYMPH:  no palpable lymphadenopathy in the cervical, axillary or inguinal regions LUNGS: clear to auscultation b/l with normal respiratory effort HEART: regular rate & rhythm ABDOMEN:  normoactive bowel sounds , non tender, not distended. Extremity: no pedal edema PSYCH: alert & oriented x 3 with fluent speech NEURO: no focal motor/sensory deficits  LABORATORY DATA:  I have reviewed the data as listed  . CBC Latest Ref Rng & Units 10/13/2018 08/11/2018 07/14/2018  WBC 4.0 - 10.5 K/uL 4.4 5.7 12.0(H)  Hemoglobin 13.0 - 17.0 g/dL 14.9 14.2 10.7(L)  Hematocrit 39.0 - 52.0 % 46.0 45.1 35.4(L)  Platelets 150 - 400 K/uL 298 263 172    . CMP Latest Ref Rng & Units 10/13/2018 08/11/2018 07/14/2018  Glucose 70 - 99 mg/dL 93 83 95  BUN 6 - 20 mg/dL '10 10 8  '$ Creatinine 0.61 - 1.24 mg/dL 0.83 0.89 0.86  Sodium 135 - 145 mmol/L 140 141 142  Potassium 3.5 - 5.1 mmol/L 4.1 3.9 3.4(L)  Chloride 98 - 111 mmol/L 106 105 105  CO2 22 - 32 mmol/L '25 25 26  '$ Calcium 8.9 - 10.3 mg/dL 9.1 9.3 9.3  Total Protein 6.5 - 8.1 g/dL 6.6 6.6 6.5  Total Bilirubin 0.3 - 1.2 mg/dL 0.5 0.4 0.3  Alkaline Phos 38 - 126 U/L 74 76 119  AST 15 - 41 U/L '20 18 22  '$ ALT 0 - 44 U/L 27 18 96(H)   03/10/18 Biopsy:     03/10/18 Tissue Flow Cytometry:     RADIOGRAPHIC STUDIES: I have personally reviewed the radiological images as listed and agreed with the findings in the report. 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 EXAM: NUCLEAR MEDICINE PET SKULL BASE TO THIGH TECHNIQUE: 6.8 mCi F-18 FDG was injected intravenously. Full-ring PET imaging was performed from the skull base to thigh after the radiotracer. CT data was obtained and used for attenuation correction and anatomic localization. Fasting blood glucose: 86 mg/dl COMPARISON:  08/05/2018 PET-CT. FINDINGS: Mediastinal blood  pool activity: SUV max 1.9 Liver activity: SUV max 2.8 NECK: No hypermetabolic lymph nodes in the neck. Incidental CT findings: Right internal jugular Port-A-Cath terminates at the cavoatrial junction. CHEST: The heterogeneous density anterior mediastinal/paramediastinal mass measures 13.8 cm transverse x 5.3 cm AP, previously 15.1 cm x 6.2 cm on 08/05/2018 PET-CT using similar measurement technique, mildly decreased in size. There is heterogeneous low level metabolism within the mass with max SUV 2.2 in the left paramediastinal portion of the mass, previous max SUV 2.9 using similar measurement technique, mildly decreased. No hypermetabolic axillary or hilar  lymph nodes. No new foci of mediastinal or pulmonary metabolism. Incidental CT findings: none ABDOMEN/PELVIS: No abnormal hypermetabolic activity within the liver, pancreas, adrenal glands, or spleen. No hypermetabolic lymph nodes in the abdomen or pelvis. Incidental CT findings: none SKELETON: No focal hypermetabolic activity to suggest skeletal metastasis. Incidental CT findings: none IMPRESSION: 1. Continued further reduction in size and metabolism of the heterogeneous density anterior mediastinal/paramediastinal mass. Persistent Deauville 3 activity. 2. No metabolic evidence of new or progressive lymphoma. Electronically Signed   By: Ilona Sorrel M.D.   On: 10/13/2018 10:34    ASSESSMENT & PLAN:   23 y.o. male with  #1Primary Mediastinal B cell Non Hodgkins lymphoma c-MYC negative Not a double hit lymphoma Pathology confirmed by pathology read at [facility].  S/p 6 cycles of dose adjusted EPOCH-R completed on 07/04/18  #2 s/p SVC compression due to mediastinal mass without overt SVC syndrome clinical symptoms #3 small acute distal left upper lobe pulmonary embolus-on lovenox #4 Acute Left IJ and Subclavian DVT due to left sided venous compression due to mass and due to malignancy -wason lovenox for malignancy related  thrombosis-- now changed to Eliquis #4 left-sided pleural effusion and left-sided lung atelectasis due to airway compression from mediastinal mass -resolved on CXR   PLAN: - Discussed pt lab work from 10/13/2018 today, 10/20/18, which were all normal.  - Discussed PET scan from 10/13/2018 showing continued further reduction in size and metabolism of the heterogeneous density anterior mediastinal/paramediastinal mass. Persistent Deauville 3 activity. No metabolic evidence of new or progressive lymphoma. - Discussed recommendations from Dr. Letta Moynahan regarding treatment -Discussed getting Prevnar and pnumonia vaccines -Return for routine labs and examination in 2 months   RTC with Dr Irene Limbo with labs in 2 months Prevnar vaccine in 2 months with clinic appointment   All of the patients questions were answered with apparent satisfaction. The patient knows to call the clinic with any problems, questions or concerns.  The total time spent in the appt was 15 minutes and more than 50% was on counseling and direct patient cares.     Sullivan Lone MD MS AAHIVMS [facility] Hematology/Oncology Physician [facility]  (Office):       [phone] (Work cell):  [phone] (Fax):           [phone]  10/20/2018 9:09 AM  I, Jacqualyn Posey, am acting as a Education administrator for Dr. Sullivan Lone.   .I have reviewed the above documentation for accuracy and completeness, and I agree with the above. Brunetta Genera MD

## 2018-10-20 ENCOUNTER — Ambulatory Visit: Payer: BC Managed Care – PPO

## 2018-10-20 ENCOUNTER — Inpatient Hospital Stay (HOSPITAL_BASED_OUTPATIENT_CLINIC_OR_DEPARTMENT_OTHER): Payer: BC Managed Care – PPO | Admitting: Hematology

## 2018-10-20 ENCOUNTER — Telehealth: Payer: Self-pay | Admitting: Hematology

## 2018-10-20 ENCOUNTER — Other Ambulatory Visit: Payer: Self-pay

## 2018-10-20 VITALS — BP 103/63 | HR 74 | Temp 99.1°F | Resp 18 | Ht 66.0 in | Wt 139.1 lb

## 2018-10-20 DIAGNOSIS — J90 Pleural effusion, not elsewhere classified: Secondary | ICD-10-CM

## 2018-10-20 DIAGNOSIS — C8528 Mediastinal (thymic) large B-cell lymphoma, lymph nodes of multiple sites: Secondary | ICD-10-CM

## 2018-10-20 DIAGNOSIS — R05 Cough: Secondary | ICD-10-CM | POA: Diagnosis not present

## 2018-10-20 DIAGNOSIS — C8520 Mediastinal (thymic) large B-cell lymphoma, unspecified site: Secondary | ICD-10-CM | POA: Diagnosis not present

## 2018-10-20 DIAGNOSIS — R0602 Shortness of breath: Secondary | ICD-10-CM

## 2018-10-20 DIAGNOSIS — R5383 Other fatigue: Secondary | ICD-10-CM | POA: Diagnosis not present

## 2018-10-20 DIAGNOSIS — Z79899 Other long term (current) drug therapy: Secondary | ICD-10-CM

## 2018-10-20 NOTE — Telephone Encounter (Signed)
Scheduled appt per 7/13 los. °

## 2018-12-21 NOTE — Progress Notes (Signed)
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 Patient notes that he started feeling unwell in August when he noted gradually increasing fatigue and weight loss.  Patient notes subsequently in September and October he developed new cough with progressive shortness of breath which triggered multiple visits with his primary care physician and was treated symptomatically and with inhalers.  Patient reports no imaging studies at the time.  Patient reports he has felt poorly during the last 2 to 3 months and during his recent visits to the [place] in [place]. He reports a total unintentional weight loss of close to 20 pounds. He also reports drenching night sweats over the last 4 to 6 weeks.  He reports that his cough and shortness of breath progressively got much more severe which led to him presenting to the emergency room yesterday. Notes nonproductive cough. No overt fevers.  Patient had a CTA of the chest on 03/07/2018 which showed  "Small acute pulmonary emboli identified within the distal left upper lobe lobar pulmonary artery. 2. Very large partially cavitary  anterior mediastinal mass is identified which encases the great vessels and its branches. Primary differential considerations include lymphoma and leukemia as well as malignant derm cell tumors. 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 Narrowing and displacement of the trachea with complete occlusion of the upper lobe bronchi. There is also marked narrowing of bilateral upper lobe pulmonary arteries. 3. Significantly diminished aeration to both upper lobes, left greater than right. 4. Moderate left pleural effusion."  Patient is currently on room air and saturating 99% with stable hemodynamics. He has been started on IV heparin due to his small pulmonary embolism. 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  No headaches.  No facial swelling.  No upper extremity swelling.  No overt clinical symptomatology of SVC syndrome despite radiographic findings of some SVC narrowing.  No overt chest pain at this time.   Interval History:   Nicholas Moreno returns today for management and evaluation of his Primary mediastinal B cell Non hodgkins lymphoma after completing 6 planned cycles of EPOCH-R. The patient's last visit with [place] was on 10/20/2018. The pt reports that he is doing well overall.  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 Pt reports no new concerns over the last 3 months. He has been eating well, exercising and has returned to his baseline weight. Pt doesn't have any issues with taking his blood thinners. Occasionally, about once every three weeks, he is experiencing a very quick  needle-like sensation in the lower part of his sternum.   Lab results today (12/22/18) of CBC w/diff and CMP is  as follows: all values are WNL except for RBC at 5.94.  12/22/2018 LDH is 156  On review of systems, pt reports sharp pain in his sternum and denies fevers, chills, night sweats, SOB, hand or leg swelling, abdominal pain and any other symptoms.    MEDICAL HISTORY:  No past medical history on file.  SURGICAL HISTORY: Past Surgical History:  Procedure Laterality Date  . IR IMAGING GUIDED PORT INSERTION  04/22/2018  . MEDIASTINOSCOPY N/A 03/10/2018   Procedure: MEDIASTINOSCOPY;  Surgeon: Ivin Poot, MD;  Location: [facility];  Service: Thoracic;  Laterality: N/A;  . VIDEO ASSISTED THORACOSCOPY (VATS)/ LYMPH NODE SAMPLING Left 03/10/2018   Procedure: VIDEO ASSISTED THORACOSCOPY (VATS)/ BIOSPY ANTERIOR APPROACH;  Surgeon: Ivin Poot, MD;  Location: [facility];  Service: Thoracic;  Laterality: Left;  Marland Kitchen VIDEO BRONCHOSCOPY Left 03/10/2018   Procedure: VIDEO BRONCHOSCOPY;  Surgeon: Ivin Poot, MD;  Location: [facility] OR;  Service: Thoracic;  Laterality: Left;    SOCIAL HISTORY: Social History   Socioeconomic History  . Marital status: Single    Spouse name: Not on file  . Number of children: Not on file  . Years of education: Not on file  . Highest education level: Not on file  Occupational History  . Not on file  Social Needs  . Financial resource strain: Not on file  . Food insecurity    Worry: Not on file    Inability: Not on file  . Transportation needs    Medical: No    Non-medical: No  Tobacco Use  . Smoking status: Never Smoker  . Smokeless tobacco: Never Used  Substance and Sexual Activity  . Alcohol use: Yes    Comment: socially.   . Drug use: Never  . Sexual activity: Not on file  Lifestyle  . Physical activity    Days per week: Not on file    Minutes per session: Not on file  . Stress: Not on file  Relationships  . Social Herbalist on phone: Not on file    Gets together: Not on file    Attends religious service: Not on file    Active member of club  or organization: Not on file    Attends meetings of clubs or organizations: Not on file    Relationship status: Not on file  . Intimate partner violence    Fear of current or ex partner: No    Emotionally abused: No    Physically abused: No    Forced sexual activity: No  Other Topics Concern  . Not on file  Social History Narrative  . Not on file    FAMILY HISTORY: No family history on file.   ALLERGIES:  has No Known Allergies.   MEDICATIONS:  Current Outpatient Medications  Medication Sig Dispense Refill  . ELIQUIS 5 MG TABS tablet TAKE 1 TABLET(5 MG) BY MOUTH TWICE DAILY 60 tablet 4  . polyethylene glycol (MIRALAX / GLYCOLAX) packet Take 17 g by mouth daily as needed. (Patient not taking: Reported on 08/12/2018) 14 each 0   No current facility-administered medications for this visit.     REVIEW OF SYSTEMS:    A 10+ POINT REVIEW OF SYSTEMS WAS OBTAINED including neurology, dermatology, psychiatry, cardiac, respiratory, lymph, extremities, GI, GU, Musculoskeletal, constitutional, breasts, reproductive, HEENT.  All pertinent positives are noted in the HPI.  All others are negative.   PHYSICAL EXAMINATION: ECOG PERFORMANCE STATUS: 1 - Symptomatic but completely ambulatory  Vitals:   12/22/18 1006  BP: 117/71  Pulse: 70  Resp: 17  Temp: 98.3 F (36.8 C)  SpO2: 100%   Filed Weights   12/22/18 1006  Weight: 137 lb 14.4 oz (62.6 kg)   Body mass index is 22.26 kg/m.  GENERAL:alert, in no acute distress and comfortable SKIN: no acute rashes, no significant lesions EYES: conjunctiva are pink and non-injected, sclera anicteric OROPHARYNX: MMM, no exudates, no oropharyngeal erythema or ulceration NECK: supple, no JVD LYMPH:  no palpable lymphadenopathy in the cervical, axillary or inguinal regions LUNGS: clear to auscultation b/l with normal respiratory effort HEART: regular rate & rhythm ABDOMEN:  normoactive bowel sounds , non tender, not distended. No palpable  hepatosplenomegaly.  Extremity: no pedal edema PSYCH: alert & oriented x 3 with fluent speech NEURO: no focal motor/sensory deficits  LABORATORY DATA:  I have reviewed the data as listed  . CBC Latest Ref Rng & Units 12/22/2018 10/13/2018 08/11/2018  WBC 4.0 - 10.5 K/uL 4.9 4.4 5.7  Hemoglobin 13.0 - 17.0 g/dL 16.3 14.9 14.2  Hematocrit 39.0 - 52.0 % 49.2 46.0 45.1  Platelets 150 - 400 K/uL 290 298 263    . CMP Latest Ref Rng & Units 12/22/2018 10/13/2018 08/11/2018  Glucose 70 - 99 mg/dL 89 93 83  BUN 6 - 20 mg/dL 13 10 10   Creatinine 0.61 - 1.24 mg/dL 0.94 0.83 0.89  Sodium 135 - 145 mmol/L 139 140 141  Potassium 3.5 - 5.1 mmol/L 4.3 4.1 3.9  Chloride 98 - 111 mmol/L 105 106 105  CO2 22 - 32 mmol/L 26 25 25   Calcium 8.9 - 10.3 mg/dL 10.0 9.1 9.3  Total Protein 6.5 - 8.1 g/dL 7.2 6.6 6.6  Total Bilirubin 0.3 - 1.2 mg/dL 0.8 0.5 0.4  Alkaline Phos 38 - 126 U/L 90 74 76  AST 15 - 41 U/L 22 20 18   ALT 0 - 44 U/L 26 27 18    03/10/18 Biopsy:     03/10/18 Tissue Flow Cytometry:     RADIOGRAPHIC STUDIES: I have personally reviewed the radiological images as listed and agreed with the findings in the report. No results found.  ASSESSMENT & PLAN:   23 y.o. male with  #1Primary Mediastinal B cell Non Hodgkins lymphoma c-MYC negative Not a double hit lymphoma Pathology confirmed by pathology read at [facility].  S/p 6 cycles of dose adjusted EPOCH-R completed on 07/04/18  10/13/2018 PET showing continued further reduction in size and metabolism of the heterogeneous density anterior mediastinal/paramediastinal mass. Persistent Deauville 3 activity. No metabolic evidence of new or progressive lymphoma.   #2 s/p SVC compression due to mediastinal mass without overt SVC syndrome clinical symptoms #3 small acute distal left upper lobe pulmonary embolus-on lovenox #4 Acute Left IJ and Subclavian DVT due to left sided venous compression due to mass and due to malignancy  -wason lovenox for malignancy related thrombosis-- now changed to Eliquis #4 left-sided pleural effusion and left-sided lung atelectasis due to airway compression from mediastinal mass -resolved on CXR   PLAN: -Discussed pt labwork today, 12/22/18; all values are WNL except for RBC at 5.94.  -Discussed 12/22/2018 LDH is 156 -No clinical or laboratory indication for active lymphoma at this time -Discussed taking pt off of blood thinners depending on the outcome of CT scan  -Plan on getting a CT scan in 3 months -Return for routine  labs and examination in 3 months   FOLLOW UP: CT chest with contrast in 12 weeks with labs RTC with Dr Irene Limbo in 3 months   The total time spent in the appt was 20 minutes and more than 50% was on counseling and direct patient cares.  All of the patient's questions were answered with apparent satisfaction. The patient knows to call the clinic with any problems, questions or concerns.    Sullivan Lone MD [place] AAHIVMS [facility] Hematology/Oncology Physician [facility]  (Office):       [phone] (Work cell):  [phone] (Fax):           [phone]  12/22/2018 11:00 AM  I, Yevette Edwards, am acting as a scribe for Dr. Sullivan Lone.   .I have reviewed the above documentation for accuracy and completeness, and I agree with the above. Brunetta Genera MD

## 2018-12-22 ENCOUNTER — Telehealth: Payer: Self-pay | Admitting: Hematology

## 2018-12-22 ENCOUNTER — Inpatient Hospital Stay: Payer: Self-pay | Attending: Hematology

## 2018-12-22 ENCOUNTER — Inpatient Hospital Stay (HOSPITAL_BASED_OUTPATIENT_CLINIC_OR_DEPARTMENT_OTHER): Payer: Self-pay | Admitting: Hematology

## 2018-12-22 ENCOUNTER — Inpatient Hospital Stay: Payer: Self-pay

## 2018-12-22 ENCOUNTER — Other Ambulatory Visit: Payer: Self-pay

## 2018-12-22 VITALS — BP 117/71 | HR 70 | Temp 98.3°F | Resp 17 | Ht 66.0 in | Wt 137.9 lb

## 2018-12-22 DIAGNOSIS — Z79899 Other long term (current) drug therapy: Secondary | ICD-10-CM | POA: Insufficient documentation

## 2018-12-22 DIAGNOSIS — C8522 Mediastinal (thymic) large B-cell lymphoma, intrathoracic lymph nodes: Secondary | ICD-10-CM

## 2018-12-22 DIAGNOSIS — C8528 Mediastinal (thymic) large B-cell lymphoma, lymph nodes of multiple sites: Secondary | ICD-10-CM

## 2018-12-22 DIAGNOSIS — Z23 Encounter for immunization: Secondary | ICD-10-CM | POA: Insufficient documentation

## 2018-12-22 LAB — CBC WITH DIFFERENTIAL/PLATELET
Abs Immature Granulocytes: 0 10*3/uL (ref 0.00–0.07)
Basophils Absolute: 0.1 10*3/uL (ref 0.0–0.1)
Basophils Relative: 1 %
Eosinophils Absolute: 0.1 10*3/uL (ref 0.0–0.5)
Eosinophils Relative: 3 %
HCT: 49.2 % (ref 39.0–52.0)
Hemoglobin: 16.3 g/dL (ref 13.0–17.0)
Immature Granulocytes: 0 %
Lymphocytes Relative: 19 %
Lymphs Abs: 1 10*3/uL (ref 0.7–4.0)
MCH: 27.4 pg (ref 26.0–34.0)
MCHC: 33.1 g/dL (ref 30.0–36.0)
MCV: 82.8 fL (ref 80.0–100.0)
Monocytes Absolute: 0.3 10*3/uL (ref 0.1–1.0)
Monocytes Relative: 6 %
Neutro Abs: 3.5 10*3/uL (ref 1.7–7.7)
Neutrophils Relative %: 71 %
Platelets: 290 10*3/uL (ref 150–400)
RBC: 5.94 MIL/uL — ABNORMAL HIGH (ref 4.22–5.81)
RDW: 14.2 % (ref 11.5–15.5)
WBC: 4.9 10*3/uL (ref 4.0–10.5)
nRBC: 0 % (ref 0.0–0.2)

## 2018-12-22 LAB — LACTATE DEHYDROGENASE: LDH: 156 U/L (ref 98–192)

## 2018-12-22 LAB — CMP (CANCER CENTER ONLY)
ALT: 26 U/L (ref 0–44)
AST: 22 U/L (ref 15–41)
Albumin: 4.7 g/dL (ref 3.5–5.0)
Alkaline Phosphatase: 90 U/L (ref 38–126)
Anion gap: 8 (ref 5–15)
BUN: 13 mg/dL (ref 6–20)
CO2: 26 mmol/L (ref 22–32)
Calcium: 10 mg/dL (ref 8.9–10.3)
Chloride: 105 mmol/L (ref 98–111)
Creatinine: 0.94 mg/dL (ref 0.61–1.24)
GFR, Est AFR Am: >60 mL/min (ref >60)
GFR, Estimated: >60 mL/min (ref >60)
Glucose, Bld: 89 mg/dL (ref 70–99)
Potassium: 4.3 mmol/L (ref 3.5–5.1)
Sodium: 139 mmol/L (ref 135–145)
Total Bilirubin: 0.8 mg/dL (ref 0.3–1.2)
Total Protein: 7.2 g/dL (ref 6.5–8.1)

## 2018-12-22 MED ORDER — PNEUMOCOCCAL 13-VAL CONJ VACC IM SUSP
0.5000 mL | Freq: Once | INTRAMUSCULAR | Status: AC
  Administered 2018-12-22: 0.5 mL via INTRAMUSCULAR
  Filled 2018-12-22: qty 0.5

## 2018-12-22 NOTE — Telephone Encounter (Signed)
Scheduled appt per 9/14 los.  Spoke with patient and he is aware of his appt date and time.

## 2018-12-22 NOTE — Patient Instructions (Signed)
Pneumococcal Conjugate Vaccine suspension for injection What is this medicine? PNEUMOCOCCAL VACCINE (NEU mo KOK al vak SEEN) is a vaccine used to prevent pneumococcus bacterial infections. These bacteria can cause serious infections like pneumonia, meningitis, and blood infections. This vaccine will lower your chance of getting pneumonia. If you do get pneumonia, it can make your symptoms milder and your illness shorter. This vaccine will not treat an infection and will not cause infection. This vaccine is recommended for infants and young children, adults with certain medical conditions, and adults 65 years or older. This medicine may be used for other purposes; ask your health care provider or pharmacist if you have questions. COMMON BRAND NAME(S): Prevnar, Prevnar 13 What should I tell my health care provider before I take this medicine? They need to know if you have any of these conditions:  bleeding problems  fever  immune system problems  an unusual or allergic reaction to pneumococcal vaccine, diphtheria toxoid, other vaccines, latex, other medicines, foods, dyes, or preservatives  pregnant or trying to get pregnant  breast-feeding How should I use this medicine? This vaccine is for injection into a muscle. It is given by a health care professional. A copy of Vaccine Information Statements will be given before each vaccination. Read this sheet carefully each time. The sheet may change frequently. Talk to your pediatrician regarding the use of this medicine in children. While this drug may be prescribed for children as young as 6 weeks old for selected conditions, precautions do apply. Overdosage: If you think you have taken too much of this medicine contact a poison control center or emergency room at once. NOTE: This medicine is only for you. Do not share this medicine with others. What if I miss a dose? It is important not to miss your dose. Call your doctor or health care  professional if you are unable to keep an appointment. What may interact with this medicine?  medicines for cancer chemotherapy  medicines that suppress your immune function  steroid medicines like prednisone or cortisone This list may not describe all possible interactions. Give your health care provider a list of all the medicines, herbs, non-prescription drugs, or dietary supplements you use. Also tell them if you smoke, drink alcohol, or use illegal drugs. Some items may interact with your medicine. What should I watch for while using this medicine? Mild fever and pain should go away in 3 days or less. Report any unusual symptoms to your doctor or health care professional. What side effects may I notice from receiving this medicine? Side effects that you should report to your doctor or health care professional as soon as possible:  allergic reactions like skin rash, itching or hives, swelling of the face, lips, or tongue  breathing problems  confused  fast or irregular heartbeat  fever over 102 degrees F  seizures  unusual bleeding or bruising  unusual muscle weakness Side effects that usually do not require medical attention (report to your doctor or health care professional if they continue or are bothersome):  aches and pains  diarrhea  fever of 102 degrees F or less  headache  irritable  loss of appetite  pain, tender at site where injected  trouble sleeping This list may not describe all possible side effects. Call your doctor for medical advice about side effects. You may report side effects to FDA at 1-800-FDA-1088. Where should I keep my medicine? This does not apply. This vaccine is given in a clinic, pharmacy, doctor's office,   or other health care setting and will not be stored at home. NOTE: This sheet is a summary. It may not cover all possible information. If you have questions about this medicine, talk to your doctor, pharmacist, or health care  provider.  2020 Elsevier/Gold Standard (2013-12-31 10:27:27)  

## 2019-03-16 ENCOUNTER — Ambulatory Visit (HOSPITAL_COMMUNITY)
Admission: RE | Admit: 2019-03-16 | Discharge: 2019-03-16 | Disposition: A | Discharge status: Home / Self Care | Payer: Self-pay | Source: Ambulatory Visit | Attending: Hematology | Admitting: Hematology

## 2019-03-16 ENCOUNTER — Other Ambulatory Visit: Payer: Self-pay

## 2019-03-16 ENCOUNTER — Inpatient Hospital Stay: Payer: Self-pay | Attending: Hematology

## 2019-03-16 ENCOUNTER — Encounter (HOSPITAL_COMMUNITY): Payer: Self-pay

## 2019-03-16 DIAGNOSIS — R634 Abnormal weight loss: Secondary | ICD-10-CM | POA: Insufficient documentation

## 2019-03-16 DIAGNOSIS — Z7901 Long term (current) use of anticoagulants: Secondary | ICD-10-CM | POA: Insufficient documentation

## 2019-03-16 DIAGNOSIS — R05 Cough: Secondary | ICD-10-CM | POA: Insufficient documentation

## 2019-03-16 DIAGNOSIS — Z86711 Personal history of pulmonary embolism: Secondary | ICD-10-CM | POA: Insufficient documentation

## 2019-03-16 DIAGNOSIS — C8522 Mediastinal (thymic) large B-cell lymphoma, intrathoracic lymph nodes: Secondary | ICD-10-CM | POA: Insufficient documentation

## 2019-03-16 DIAGNOSIS — R0602 Shortness of breath: Secondary | ICD-10-CM | POA: Insufficient documentation

## 2019-03-16 LAB — CMP (CANCER CENTER ONLY)
ALT: 29 U/L (ref 0–44)
AST: 27 U/L (ref 15–41)
Albumin: 4.5 g/dL (ref 3.5–5.0)
Alkaline Phosphatase: 72 U/L (ref 38–126)
Anion gap: 10 (ref 5–15)
BUN: 15 mg/dL (ref 6–20)
CO2: 26 mmol/L (ref 22–32)
Calcium: 9.1 mg/dL (ref 8.9–10.3)
Chloride: 105 mmol/L (ref 98–111)
Creatinine: 0.85 mg/dL (ref 0.61–1.24)
GFR, Est AFR Am: >60 mL/min (ref >60)
GFR, Estimated: >60 mL/min (ref >60)
Glucose, Bld: 95 mg/dL (ref 70–99)
Potassium: 3.9 mmol/L (ref 3.5–5.1)
Sodium: 141 mmol/L (ref 135–145)
Total Bilirubin: 0.6 mg/dL (ref 0.3–1.2)
Total Protein: 7 g/dL (ref 6.5–8.1)

## 2019-03-16 LAB — CBC WITH DIFFERENTIAL/PLATELET
Abs Immature Granulocytes: 0.01 10*3/uL (ref 0.00–0.07)
Basophils Absolute: 0.1 10*3/uL (ref 0.0–0.1)
Basophils Relative: 1 %
Eosinophils Absolute: 0.1 10*3/uL (ref 0.0–0.5)
Eosinophils Relative: 1 %
HCT: 49.2 % (ref 39.0–52.0)
Hemoglobin: 16.7 g/dL (ref 13.0–17.0)
Immature Granulocytes: 0 %
Lymphocytes Relative: 24 %
Lymphs Abs: 1.4 10*3/uL (ref 0.7–4.0)
MCH: 28.7 pg (ref 26.0–34.0)
MCHC: 33.9 g/dL (ref 30.0–36.0)
MCV: 84.7 fL (ref 80.0–100.0)
Monocytes Absolute: 0.3 10*3/uL (ref 0.1–1.0)
Monocytes Relative: 5 %
Neutro Abs: 4 10*3/uL (ref 1.7–7.7)
Neutrophils Relative %: 69 %
Platelets: 303 10*3/uL (ref 150–400)
RBC: 5.81 MIL/uL (ref 4.22–5.81)
RDW: 13.2 % (ref 11.5–15.5)
WBC: 5.9 10*3/uL (ref 4.0–10.5)
nRBC: 0 % (ref 0.0–0.2)

## 2019-03-16 LAB — LACTATE DEHYDROGENASE: LDH: 160 U/L (ref 98–192)

## 2019-03-16 MED ORDER — SODIUM CHLORIDE (PF) 0.9 % IJ SOLN
INTRAMUSCULAR | Status: AC
  Filled 2019-03-16: qty 50

## 2019-03-16 MED ORDER — IOHEXOL 300 MG/ML  SOLN
75.0000 mL | Freq: Once | INTRAMUSCULAR | Status: AC | PRN
  Administered 2019-03-16: 75 mL via INTRAVENOUS

## 2019-03-16 MED ORDER — HEPARIN SOD (PORK) LOCK FLUSH 100 UNIT/ML IV SOLN
INTRAVENOUS | Status: AC
  Filled 2019-03-16: qty 5

## 2019-03-23 ENCOUNTER — Other Ambulatory Visit: Payer: Self-pay

## 2019-03-23 ENCOUNTER — Inpatient Hospital Stay (HOSPITAL_BASED_OUTPATIENT_CLINIC_OR_DEPARTMENT_OTHER): Payer: Self-pay | Admitting: Hematology

## 2019-03-23 ENCOUNTER — Telehealth: Payer: Self-pay | Admitting: Hematology

## 2019-03-23 VITALS — BP 113/72 | HR 74 | Temp 97.8°F | Resp 18 | Ht 66.0 in | Wt 133.0 lb

## 2019-03-23 DIAGNOSIS — Z23 Encounter for immunization: Secondary | ICD-10-CM

## 2019-03-23 DIAGNOSIS — Z95828 Presence of other vascular implants and grafts: Secondary | ICD-10-CM

## 2019-03-23 DIAGNOSIS — C8522 Mediastinal (thymic) large B-cell lymphoma, intrathoracic lymph nodes: Secondary | ICD-10-CM

## 2019-03-23 NOTE — Telephone Encounter (Signed)
Scheduled appt per 12/14 los.  Printed avs per patient request.

## 2019-03-23 NOTE — Progress Notes (Signed)
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 Patient notes that he started feeling unwell in August when he noted gradually increasing fatigue and weight loss.  Patient notes subsequently in September and October he developed new cough with progressive shortness of breath which triggered multiple visits with his primary care physician and was treated symptomatically and with inhalers.  Patient reports no imaging studies at the time.  Patient reports he has felt poorly during the last 2 to 3 months and during his recent visits to the [place] in [place]. He reports a total unintentional weight loss of close to 20 pounds. He also reports drenching night sweats over the last 4 to 6 weeks.  He reports that his cough and shortness of breath progressively got much more severe which led to him presenting to the emergency room yesterday. Notes nonproductive cough. No overt fevers.  Patient had a CTA of the chest on 03/07/2018 which showed  "Small acute pulmonary emboli identified within the distal left upper lobe lobar pulmonary artery. 2. Very large partially cavitary  anterior mediastinal mass is identified which encases the great vessels and its branches. Primary differential considerations include lymphoma and leukemia as well as malignant derm cell tumors. 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 Narrowing and displacement of the trachea with complete occlusion of the upper lobe bronchi. There is also marked narrowing of bilateral upper lobe pulmonary arteries. 3. Significantly diminished aeration to both upper lobes, left greater than right. 4. Moderate left pleural effusion."  Patient is currently on room air and saturating 99% with stable hemodynamics. He has been started on IV heparin due to his small pulmonary embolism. 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  No headaches.  No facial swelling.  No upper extremity swelling.  No overt clinical symptomatology of SVC syndrome despite radiographic findings of some SVC narrowing.  No overt chest pain at this time.   Interval History:   Nicholas Moreno returns today for management and evaluation of his Primary mediastinal B cell Non hodgkins lymphoma after completing 6 planned cycles of EPOCH-R. The patient's last visit with [place] was on 12/22/2018. The pt reports that he is doing well overall.  The pt reports that he has been well and has found employment in [place]. He plans on leaving in late June. Pt has not been on Eliquis for 2 months due to not having insurance. He has been using tumeric in its place. Pt has not had his annual flu shot and is due for the Pneumovax. He has  not had any episodes of SOB and is close to his baseline weight.   Of note since the patient's last visit, pt has had CT Chest ([id number]) completed on 03/16/2019 with  results revealing "1. Slight interval decrease in size of bulky, hypodense pleural based masses of the paramedian left and right upper lobes, confluent with the adjacent mediastinum and demonstrating continued necrotic cavitation. Left sided mass measures 4.6 x 4.6 cm, previously 5.3 x 5.0 cm when measured similarly (series 2, image 67). Right-sided mass measures 3.0 x 1.7 cm, previously 3.8 x 1.9 cm when measured similarly (series 2, image 55). Findings are consistent with ongoing treatment response. 2. No significant change in post treatment appearance of anterior mediastinal soft tissue and lymph nodes, including nodes with peripheral dystrophic calcification, largest in the right aspect of the anterior mediastinum measuring 2.6 x 1.6 cm (series 2, image 54)."  Lab results (03/16/19) of CBC w/diff and CMP is as follows: all values are WNL. 03/16/2019 LDH at 160  On review of systems, pt denies SOB, fevers, chills, night sweats, neuropathy in fingers/toes and any other symptoms.   MEDICAL HISTORY:  No past medical history on file.  SURGICAL HISTORY: Past Surgical History:  Procedure Laterality Date  . IR IMAGING GUIDED PORT INSERTION  04/22/2018  . MEDIASTINOSCOPY N/A 03/10/2018   Procedure: MEDIASTINOSCOPY;  Surgeon: Ivin Poot, MD;  Location: [facility];  Service: Thoracic;  Laterality: N/A;  . VIDEO ASSISTED THORACOSCOPY (VATS)/ LYMPH NODE SAMPLING Left 03/10/2018   Procedure: VIDEO ASSISTED THORACOSCOPY (VATS)/ BIOSPY ANTERIOR APPROACH;  Surgeon: Ivin Poot, MD;  Location: [facility];  Service: Thoracic;  Laterality: Left;  Marland Kitchen VIDEO BRONCHOSCOPY Left 03/10/2018   Procedure: VIDEO BRONCHOSCOPY;  Surgeon: Ivin Poot, MD;  Location: [facility] OR;  Service: Thoracic;  Laterality: Left;    SOCIAL HISTORY: Social History   Socioeconomic History  . Marital status: Single    Spouse name: Not on file  . Number of children: Not on file  . Years of education: Not on file  . Highest education  level: Not on file  Occupational History  . Not on file  Tobacco Use  . Smoking status: Never Smoker  . Smokeless tobacco: Never Used  Substance and Sexual Activity  . Alcohol use: Yes    Comment: socially.   . Drug use: Never  . Sexual activity: Not on file  Other Topics Concern  . Not on file  Social History Narrative  . Not on file   Social Determinants of Health   Financial Resource Strain:   . Difficulty of Paying Living Expenses: Not on file  Food Insecurity:   . Worried About Charity fundraiser in the Last Year: Not on file  . Ran Out of Food in the Last Year: Not on file  Transportation Needs: No Transportation Needs  . Lack of Transportation (Medical): No  . Lack of Transportation (Non-Medical): No  Physical Activity:   . Days of Exercise per Week: Not on file  . Minutes of Exercise per Session: Not on file  Stress:   . Feeling of Stress : Not on file  Social Connections:   . Frequency of Communication with Friends and Family: Not on file  . Frequency of Social Gatherings with Friends and Family: Not on file  . Attends Religious Services: Not on file  . Active Member of Clubs or Organizations: Not on file  . Attends Archivist Meetings: Not on file  . Marital Status: Not  on file  Intimate Partner Violence: Not At Risk  . Fear of Current or Ex-Partner: No  . Emotionally Abused: No  . Physically Abused: No  . Sexually Abused: No    FAMILY HISTORY: No family history on file.   ALLERGIES:  has No Known Allergies.   MEDICATIONS:  Current Outpatient Medications  Medication Sig Dispense Refill  . ELIQUIS 5 MG TABS tablet TAKE 1 TABLET(5 MG) BY MOUTH TWICE DAILY 60 tablet 4  . polyethylene glycol (MIRALAX / GLYCOLAX) packet Take 17 g by mouth daily as needed. (Patient not taking: Reported on 03/23/2019) 14 each 0   No current facility-administered medications for this visit.    REVIEW OF SYSTEMS:   A 10+ POINT REVIEW OF SYSTEMS WAS OBTAINED  including neurology, dermatology, psychiatry, cardiac, respiratory, lymph, extremities, GI, GU, Musculoskeletal, constitutional, breasts, reproductive, HEENT.  All pertinent positives are noted in the HPI.  All others are negative.   PHYSICAL EXAMINATION: ECOG PERFORMANCE STATUS: 1 - Symptomatic but completely ambulatory  Vitals:   03/23/19 0939  BP: 113/72  Pulse: 74  Resp: 18  Temp: 97.8 F (36.6 C)  SpO2: 99%   Filed Weights   03/23/19 0939  Weight: 133 lb (60.3 kg)   Body mass index is 21.47 kg/m.   GENERAL:alert, in no acute distress and comfortable SKIN: no acute rashes, no significant lesions EYES: conjunctiva are pink and non-injected, sclera anicteric OROPHARYNX: MMM, no exudates, no oropharyngeal erythema or ulceration NECK: supple, no JVD LYMPH:  no palpable lymphadenopathy in the cervical, axillary or inguinal regions LUNGS: clear to auscultation b/l with normal respiratory effort HEART: regular rate & rhythm ABDOMEN:  normoactive bowel sounds , non tender, not distended. No palpable hepatosplenomegaly.  Extremity: no pedal edema PSYCH: alert & oriented x 3 with fluent speech NEURO: no focal motor/sensory deficits  LABORATORY DATA:  I have reviewed the data as listed  . CBC Latest Ref Rng & Units 03/16/2019 12/22/2018 10/13/2018  WBC 4.0 - 10.5 K/uL 5.9 4.9 4.4  Hemoglobin 13.0 - 17.0 g/dL 16.7 16.3 14.9  Hematocrit 39.0 - 52.0 % 49.2 49.2 46.0  Platelets 150 - 400 K/uL 303 290 298    . CMP Latest Ref Rng & Units 03/16/2019 12/22/2018 10/13/2018  Glucose 70 - 99 mg/dL 95 89 93  BUN 6 - 20 mg/dL 15 13 10   Creatinine 0.61 - 1.24 mg/dL 0.85 0.94 0.83  Sodium 135 - 145 mmol/L 141 139 140  Potassium 3.5 - 5.1 mmol/L 3.9 4.3 4.1  Chloride 98 - 111 mmol/L 105 105 106  CO2 22 - 32 mmol/L 26 26 25   Calcium 8.9 - 10.3 mg/dL 9.1 10.0 9.1  Total Protein 6.5 - 8.1 g/dL 7.0 7.2 6.6  Total Bilirubin 0.3 - 1.2 mg/dL 0.6 0.8 0.5  Alkaline Phos 38 - 126 U/L 72 90 74  AST  15 - 41 U/L 27 22 20   ALT 0 - 44 U/L 29 26 27    03/10/18 Biopsy:     03/10/18 Tissue Flow Cytometry:     RADIOGRAPHIC STUDIES: I have personally reviewed the radiological images as listed and agreed with the findings in the report. CT Chest W Contrast  Result Date: 03/16/2019 CLINICAL DATA:  Mediastinal diffuse large B-cell lymphoma, follow-up treatment EXAM: CT CHEST WITH CONTRAST TECHNIQUE: Multidetector CT imaging of the chest was performed during intravenous contrast administration. CONTRAST:  42mL OMNIPAQUE IOHEXOL 300 MG/ML  SOLN COMPARISON:  PET-CT, 10/13/2018, 08/05/2018, 06/05/2018, 04/03/2018, CT chest angiogram, 03/07/2018 FINDINGS: Cardiovascular: No  significant vascular findings. Normal heart size. No pericardial effusion. Mediastinum/Nodes: No significant change in post treatment appearance of anterior mediastinal soft tissue and lymph nodes, including nodes with peripheral dystrophic calcification, largest in the right aspect of the anterior mediastinum measuring 2.6 x 1.6 cm (series 2, image 54). Thyroid gland, trachea, and esophagus demonstrate no significant findings. Lungs/Pleura: Slight interval decrease in size of bulky, hypodense pleural based masses of the paramedian left and right upper lobes, confluent with the adjacent mediastinum and demonstrating continued necrotic cavitation. Left sided mass measures 4.6 x 4.6 cm, previously 5.3 x 5.0 cm when measured similarly (series 2, image 67). Right-sided mass measures 3.0 x 1.7 cm, previously 3.8 x 1.9 cm when measured similarly (series 2, image 55). No pleural effusion or pneumothorax. Upper Abdomen: No acute abnormality. Musculoskeletal: No chest wall mass or suspicious bone lesions identified. IMPRESSION: 1. Slight interval decrease in size of bulky, hypodense pleural based masses of the paramedian left and right upper lobes, confluent with the adjacent mediastinum and demonstrating continued necrotic cavitation. Left sided mass  measures 4.6 x 4.6 cm, previously 5.3 x 5.0 cm when measured similarly (series 2, image 67). Right-sided mass measures 3.0 x 1.7 cm, previously 3.8 x 1.9 cm when measured similarly (series 2, image 55). Findings are consistent with ongoing treatment response. 2. No significant change in post treatment appearance of anterior mediastinal soft tissue and lymph nodes, including nodes with peripheral dystrophic calcification, largest in the right aspect of the anterior mediastinum measuring 2.6 x 1.6 cm (series 2, image 54). Electronically Signed   By: Eddie Candle M.D.   On: 03/16/2019 12:56    ASSESSMENT & PLAN:   23 y.o. male with  #1Primary Mediastinal B cell Non Hodgkins lymphoma c-MYC negative Not a double hit lymphoma Pathology confirmed by pathology read at [facility].  S/p 6 cycles of dose adjusted EPOCH-R completed on 07/04/18  10/13/2018 PET showing continued further reduction in size and metabolism of the heterogeneous density anterior mediastinal/paramediastinal mass. Persistent Deauville 3 activity. No metabolic evidence of new or progressive lymphoma.   #2 s/p SVC compression due to mediastinal mass without overt SVC syndrome clinical symptoms #3 small acute distal left upper lobe pulmonary embolus-on lovenox #4 Acute Left IJ and Subclavian DVT due to left sided venous compression due to mass and due to malignancy -wason lovenox for malignancy related thrombosis-- now changed to Eliquis #4 left-sided pleural effusion and left-sided lung atelectasis due to airway compression from mediastinal mass -resolved on CXR   PLAN: -Discussed pt labwork, 03/16/19;  all values are WNL. -Discussed 03/16/2019 LDH at 160 -Discussed 03/16/2019 CT Chest ([id number]) which revealed "1. Slight interval decrease in size of bulky, hypodense pleural based masses of the paramedian left and right upper lobes, confluent with the adjacent mediastinum and demonstrating continued necrotic  cavitation. Left sided mass measures 4.6 x 4.6 cm, previously 5.3 x 5.0 cm when measured similarly (series 2, image 67). Right-sided mass measures 3.0 x 1.7 cm, previously 3.8 x 1.9 cm when measured similarly (series 2, image 55). Findings are consistent with ongoing treatment response. 2. No significant change in post treatment appearance of anterior mediastinal soft tissue and lymph nodes, including nodes with peripheral dystrophic calcification, largest in the right aspect of the anterior mediastinum measuring 2.6 x 1.6 cm (series 2, image 54)." -Pt is now 9 months out from treatment -Pt would like to have his port removed at this time -No clinical or laboratory indication for active lymphoma at this  time -Advised pt that we would re-scan for new symptoms, otherwise would rpt CT Chest in 6-12 months  -Advised pt that the highest chance of recurrence is in 16-24 months -Advised pt that we will have to transfer his ongoing care when he moves  -Recommended pt take 81 mg enteric coated baby Asprin for 6 months -Recommended that the pt continue to eat well, drink at least 48-64 oz of water each day, and walk 20-30 minutes each day.  -Recommended pt get the annual flu vaccine  -Will give pt Pneumovax in 4 weeks, same day as port removal -Will order port removal in 4 weeks -Will see back in 3 months with labs   FOLLOW UP: Injection appointment for Pneumovax in 1 month (on same day as port a cath removal) IR for port a cath removal in 1 month RTC with Dr Irene Limbo with labs in 3 months  The total time spent in the appt was 25 minutes and more than 50% was on counseling and direct patient cares.  All of the patient's questions were answered with apparent satisfaction. The patient knows to call the clinic with any problems, questions or concerns.    Sullivan Lone MD [place] AAHIVMS [facility] Hematology/Oncology Physician [facility]  (Office):       [phone] (Work cell):   [phone] (Fax):           [phone]  03/23/2019 10:50 AM  I, Yevette Edwards, am acting as a scribe for Dr. Sullivan Lone.   .I have reviewed the above documentation for accuracy and completeness, and I agree with the above. Brunetta Genera MD

## 2019-04-17 ENCOUNTER — Other Ambulatory Visit: Payer: Self-pay | Admitting: Radiology

## 2019-04-20 ENCOUNTER — Ambulatory Visit (HOSPITAL_COMMUNITY)
Admission: RE | Admit: 2019-04-20 | Discharge: 2019-04-20 | Disposition: A | Discharge status: Home / Self Care | Payer: Self-pay | Source: Ambulatory Visit | Attending: Hematology | Admitting: Hematology

## 2019-04-20 ENCOUNTER — Inpatient Hospital Stay: Payer: Self-pay | Attending: Hematology

## 2019-04-20 ENCOUNTER — Other Ambulatory Visit: Payer: Self-pay

## 2019-04-20 ENCOUNTER — Encounter (HOSPITAL_COMMUNITY): Payer: Self-pay

## 2019-04-20 DIAGNOSIS — Z452 Encounter for adjustment and management of vascular access device: Secondary | ICD-10-CM | POA: Insufficient documentation

## 2019-04-20 DIAGNOSIS — Z95828 Presence of other vascular implants and grafts: Secondary | ICD-10-CM

## 2019-04-20 DIAGNOSIS — Z9221 Personal history of antineoplastic chemotherapy: Secondary | ICD-10-CM | POA: Insufficient documentation

## 2019-04-20 DIAGNOSIS — Z7901 Long term (current) use of anticoagulants: Secondary | ICD-10-CM | POA: Insufficient documentation

## 2019-04-20 DIAGNOSIS — C8522 Mediastinal (thymic) large B-cell lymphoma, intrathoracic lymph nodes: Secondary | ICD-10-CM | POA: Insufficient documentation

## 2019-04-20 HISTORY — PX: IR REMOVAL TUN ACCESS W/ PORT W/O FL MOD SED: IMG2290

## 2019-04-20 LAB — CBC WITH DIFFERENTIAL/PLATELET
Abs Immature Granulocytes: 0.01 10*3/uL (ref 0.00–0.07)
Basophils Absolute: 0.1 10*3/uL (ref 0.0–0.1)
Basophils Relative: 1 %
Eosinophils Absolute: 0.1 10*3/uL (ref 0.0–0.5)
Eosinophils Relative: 1 %
HCT: 52.8 % — ABNORMAL HIGH (ref 39.0–52.0)
Hemoglobin: 17.4 g/dL — ABNORMAL HIGH (ref 13.0–17.0)
Immature Granulocytes: 0 %
Lymphocytes Relative: 17 %
Lymphs Abs: 1 10*3/uL (ref 0.7–4.0)
MCH: 28.8 pg (ref 26.0–34.0)
MCHC: 33 g/dL (ref 30.0–36.0)
MCV: 87.4 fL (ref 80.0–100.0)
Monocytes Absolute: 0.3 10*3/uL (ref 0.1–1.0)
Monocytes Relative: 5 %
Neutro Abs: 4.2 10*3/uL (ref 1.7–7.7)
Neutrophils Relative %: 76 %
Platelets: 293 10*3/uL (ref 150–400)
RBC: 6.04 MIL/uL — ABNORMAL HIGH (ref 4.22–5.81)
RDW: 12.7 % (ref 11.5–15.5)
WBC: 5.7 10*3/uL (ref 4.0–10.5)
nRBC: 0 % (ref 0.0–0.2)

## 2019-04-20 LAB — PROTIME-INR
INR: 0.9 (ref 0.8–1.2)
Prothrombin Time: 12.3 seconds (ref 11.4–15.2)

## 2019-04-20 MED ORDER — LIDOCAINE HCL 1 % IJ SOLN
INTRAMUSCULAR | Status: AC | PRN
  Administered 2019-04-20: 5 mL

## 2019-04-20 MED ORDER — CEFAZOLIN SODIUM-DEXTROSE 2-4 GM/100ML-% IV SOLN: 2.0000 g | INTRAVENOUS | Status: AC

## 2019-04-20 MED ORDER — SODIUM CHLORIDE 0.9 % IV SOLN: INTRAVENOUS | Status: DC

## 2019-04-20 MED ORDER — CEFAZOLIN SODIUM-DEXTROSE 2-4 GM/100ML-% IV SOLN
INTRAVENOUS | Status: AC
  Administered 2019-04-20: 2 g via INTRAVENOUS
  Filled 2019-04-20: qty 100

## 2019-04-20 NOTE — Discharge Instructions (Signed)
Implanted Port Removal, Care After This sheet gives you information about how to care for yourself after your procedure. Your health care provider may also give you more specific instructions. If you have problems or questions, contact your health care provider. What can I expect after the procedure? After the procedure, it is common to have:  Soreness or pain near your incision.  Some swelling or bruising near your incision. Follow these instructions at home: Medicines  Take over-the-counter and prescription medicines only as told by your health care provider.  If you were prescribed an antibiotic medicine, take it as told by your health care provider. Do not stop taking the antibiotic even if you start to feel better. Bathing  Do not take baths, swim, or use a hot tub until your health care provider approves. Ask your health care provider if you can take showers. You may only be allowed to take sponge baths. Incision care   Follow instructions from your health care provider about how to take care of your incision. Make sure you: ? Wash your hands with soap and water before you change your bandage (dressing). If soap and water are not available, use hand sanitizer. ? Change your dressing as told by your health care provider. ? Keep your dressing dry. ? Leave stitches (sutures), skin glue, or adhesive strips in place. These skin closures may need to stay in place for 2 weeks or longer. If adhesive strip edges start to loosen and curl up, you may trim the loose edges. Do not remove adhesive strips completely unless your health care provider tells you to do that.  Check your incision area every day for signs of infection. Check for: ? More redness, swelling, or pain. ? More fluid or blood. ? Warmth. ? Pus or a bad smell. Driving   Do not drive for 24 hours if you were given a medicine to help you relax (sedative) during your procedure.  If you did not receive a sedative, ask your  health care provider when it is safe to drive. Activity  Return to your normal activities as told by your health care provider. Ask your health care provider what activities are safe for you.  Do not lift anything that is heavier than 10 lb (4.5 kg), or the limit that you are told, until your health care provider says that it is safe.  Do not do activities that involve lifting your arms over your head. General instructions  Do not use any products that contain nicotine or tobacco, such as cigarettes and e-cigarettes. These can delay healing. If you need help quitting, ask your health care provider.  Keep all follow-up visits as told by your health care provider. This is important. Contact a health care provider if:  You have more redness, swelling, or pain around your incision.  You have more fluid or blood coming from your incision.  Your incision feels warm to the touch.  You have pus or a bad smell coming from your incision.  You have pain that is not relieved by your pain medicine. Get help right away if you have:  A fever or chills.  Chest pain.  Difficulty breathing. Summary  After the procedure, it is common to have pain, soreness, swelling, or bruising near your incision.  If you were prescribed an antibiotic medicine, take it as told by your health care provider. Do not stop taking the antibiotic even if you start to feel better.  Do not drive for 24 hours   if you were given a sedative during your procedure.  Return to your normal activities as told by your health care provider. Ask your health care provider what activities are safe for you. This information is not intended to replace advice given to you by your health care provider. Make sure you discuss any questions you have with your health care provider. Document Revised: 05/09/2017 Document Reviewed: 05/09/2017 Elsevier Patient Education  2020 [employer]  

## 2019-04-20 NOTE — H&P (Signed)
Chief Complaint: Patient was seen in consultation today for port removal.  Referring Physician(s): Brunetta Genera  Supervising Physician: Jacqulynn Cadet  Patient Status: [place]  History of Present Illness: Nicholas Moreno is a 24 y.o. male with a past medical history significant for mediastinal B cell non hodgkin's lymphoma followed by Dr. Irene Limbo who presents today for port removal as he has completed treatment.  Mr. Hefter reports that he has been feeling well overall, he has noticed that the port feels "more present now" than it did previously, however he denies any pain, swelling, redness or discharge. He thinks the last time it was used was September of 2020, he has not had it flushed since then. He states understanding of the requested procedure and agrees to proceed as planned.   History reviewed. No pertinent past medical history.  Past Surgical History:  Procedure Laterality Date  . IR IMAGING GUIDED PORT INSERTION  04/22/2018  . MEDIASTINOSCOPY N/A 03/10/2018   Procedure: MEDIASTINOSCOPY;  Surgeon: Ivin Poot, MD;  Location: [place];  Service: Thoracic;  Laterality: N/A;  . VIDEO ASSISTED THORACOSCOPY (VATS)/ LYMPH NODE SAMPLING Left 03/10/2018   Procedure: VIDEO ASSISTED THORACOSCOPY (VATS)/ BIOSPY ANTERIOR APPROACH;  Surgeon: Ivin Poot, MD;  Location: [place];  Service: Thoracic;  Laterality: Left;  Marland Kitchen VIDEO BRONCHOSCOPY Left 03/10/2018   Procedure: VIDEO BRONCHOSCOPY;  Surgeon: Ivin Poot, MD;  Location: [place];  Service: Thoracic;  Laterality: Left;    Allergies: Patient has no known allergies.  Medications: Prior to Admission medications   Medication Sig Start Date End Date Taking? Authorizing Provider  ELIQUIS 5 MG TABS tablet TAKE 1 TABLET(5 MG) BY MOUTH TWICE DAILY 10/02/18   Brunetta Genera, MD  polyethylene glycol [facility] / GLYCOLAX) packet Take 17 g by mouth daily as needed. Patient not taking: Reported on 03/23/2019 07/04/18    Maryanna Shape, NP     History reviewed. No pertinent family history.  Social History   Socioeconomic History  . Marital status: Single    Spouse name: Not on file  . Number of children: Not on file  . Years of education: Not on file  . Highest education level: Not on file  Occupational History  . Not on file  Tobacco Use  . Smoking status: Never Smoker  . Smokeless tobacco: Never Used  Substance and Sexual Activity  . Alcohol use: Yes    Comment: socially.   . Drug use: Never  . Sexual activity: Not on file  Other Topics Concern  . Not on file  Social History Narrative  . Not on file   Social Determinants of Health   Financial Resource Strain:   . Difficulty of Paying Living Expenses: Not on file  Food Insecurity:   . Worried About Charity fundraiser in the Last Year: Not on file  . Ran Out of Food in the Last Year: Not on file  Transportation Needs: No Transportation Needs  . Lack of Transportation (Medical): No  . Lack of Transportation (Non-Medical): No  Physical Activity:   . Days of Exercise per Week: Not on file  . Minutes of Exercise per Session: Not on file  Stress:   . Feeling of Stress : Not on file  Social Connections:   . Frequency of Communication with Friends and Family: Not on file  . Frequency of Social Gatherings with Friends and Family: Not on file  . Attends Religious Services: Not on file  . Active Member  of Clubs or Organizations: Not on file  . Attends Archivist Meetings: Not on file  . Marital Status: Not on file     Review of Systems: A 12 point ROS discussed and pertinent positives are indicated in the HPI above.  All other systems are negative.  Review of Systems  Constitutional: Negative for chills and fever.  Respiratory: Negative for cough and shortness of breath.   Cardiovascular: Negative for chest pain.  Gastrointestinal: Negative for abdominal pain, diarrhea, nausea and vomiting.  Musculoskeletal: Negative  for back pain.  Skin: Negative for rash.  Neurological: Negative for dizziness and headaches.    Vital Signs: Ht 5\' 6"  (1.676 m)   Wt 130 lb (59 kg)   BMI 20.98 kg/m   Physical Exam Vitals reviewed.  Constitutional:      General: He is not in acute distress. HENT:     Head: Normocephalic.     Mouth/Throat:     Mouth: Mucous membranes are moist.     Pharynx: Oropharynx is clear. No oropharyngeal exudate or posterior oropharyngeal erythema.  Cardiovascular:     Rate and Rhythm: Normal rate and regular rhythm.     Comments: (+) right sided port - unremarkable in appearance, non tender to palpation Pulmonary:     Effort: Pulmonary effort is normal.     Breath sounds: Normal breath sounds.  Abdominal:     General: There is no distension.     Palpations: Abdomen is soft.     Tenderness: There is no abdominal tenderness.  Skin:    General: Skin is warm and dry.  Neurological:     Mental Status: He is alert and oriented to person, place, and time.  Psychiatric:        Mood and Affect: Mood normal.        Behavior: Behavior normal.        Thought Content: Thought content normal.        Judgment: Judgment normal.      MD Evaluation Airway: WNL Heart: WNL Abdomen: WNL Chest/ Lungs: WNL ASA  Classification: 2 Mallampati/Airway Score: One   Imaging: No results found.  Labs:  CBC: Recent Labs    10/13/18 0743 12/22/18 0905 03/16/19 0846 04/20/19 0900  WBC 4.4 4.9 5.9 5.7  HGB 14.9 16.3 16.7 17.4*  HCT 46.0 49.2 49.2 52.8*  PLT 298 290 303 293    COAGS: Recent Labs    04/22/18 1324 04/20/19 0900  INR 0.84 0.9    BMP: Recent Labs    08/11/18 0831 10/13/18 0743 12/22/18 0905 03/16/19 0846  NA 141 140 139 141  K 3.9 4.1 4.3 3.9  CL 105 106 105 105  CO2 25 25 26 26   GLUCOSE 83 93 89 95  BUN 10 10 13 15   CALCIUM 9.3 9.1 10.0 9.1  CREATININE 0.89 0.83 0.94 0.85  GFRNONAA >60 >60 >60 >60  GFRAA >60 >60 >60 >60    LIVER FUNCTION TESTS: Recent  Labs    08/11/18 0831 10/13/18 0743 12/22/18 0905 03/16/19 0846  BILITOT 0.4 0.5 0.8 0.6  AST 18 20 22 27   ALT 18 27 26 29   ALKPHOS 76 74 90 72  PROT 6.6 6.6 7.2 7.0  ALBUMIN 4.2 3.9 4.7 4.5    TUMOR MARKERS: No results for input(s): AFPTM, CEA, CA199, CHROMGRNA in the last 8760 hours.  Assessment and Plan:   24 y/o M with history of mediastinal B cell non hodgkin's lymphoma followed by Dr. Irene Limbo who  presents today for port removal due to completion of systemic treatment.  Patient has been NPO since 9 pm Saturday - he states he regularly fasts for >24 hours and he chose to fast yesterday with the exception of water, he has not had any water since midnight. He no longer takes Eliquis and is instead taking tumeric + ASA 81 mg. Afebrile, WBC 5.7, hgb 17.4, plt 293, INR 0.9.  Risks and benefits of image-guided Port-a-catheter removal were discussed with the patient including, but not limited to bleeding, infection, pneumothorax and need for additional procedures.  All of the patient's questions were answered, patient is agreeable to proceed.  Consent signed and in chart.  Thank you for this interesting consult.  I greatly enjoyed meeting Zohar Jore and look forward to participating in their care.  A copy of this report was sent to the requesting provider on this date.  Electronically Signed: Joaquim Nam, PA-C 04/20/2019, 9:58 AM   I spent a total of  15 Minutes in face to face in clinical consultation, greater than 50% of which was counseling/coordinating care for port removal.

## 2019-04-20 NOTE — Procedures (Signed)
Interventional Radiology Procedure Note  Procedure: Removal of portacatheter.   Complications: None  Estimated Blood Loss: None  Recommendations: - DC home  Signed,  Criselda Peaches, MD

## 2019-06-22 ENCOUNTER — Other Ambulatory Visit: Payer: Self-pay

## 2019-06-22 ENCOUNTER — Inpatient Hospital Stay (HOSPITAL_BASED_OUTPATIENT_CLINIC_OR_DEPARTMENT_OTHER): Payer: Self-pay | Admitting: Hematology

## 2019-06-22 ENCOUNTER — Telehealth: Payer: Self-pay | Admitting: Hematology

## 2019-06-22 ENCOUNTER — Inpatient Hospital Stay: Payer: Self-pay | Attending: Hematology

## 2019-06-22 VITALS — BP 104/65 | HR 64 | Temp 97.8°F | Resp 18 | Ht 66.0 in | Wt 132.7 lb

## 2019-06-22 DIAGNOSIS — C8522 Mediastinal (thymic) large B-cell lymphoma, intrathoracic lymph nodes: Secondary | ICD-10-CM | POA: Insufficient documentation

## 2019-06-22 DIAGNOSIS — Z9221 Personal history of antineoplastic chemotherapy: Secondary | ICD-10-CM | POA: Insufficient documentation

## 2019-06-22 DIAGNOSIS — Z7901 Long term (current) use of anticoagulants: Secondary | ICD-10-CM | POA: Insufficient documentation

## 2019-06-22 DIAGNOSIS — I2699 Other pulmonary embolism without acute cor pulmonale: Secondary | ICD-10-CM | POA: Insufficient documentation

## 2019-06-22 DIAGNOSIS — Z7982 Long term (current) use of aspirin: Secondary | ICD-10-CM | POA: Insufficient documentation

## 2019-06-22 LAB — CBC WITH DIFFERENTIAL/PLATELET
Abs Immature Granulocytes: 0.01 10*3/uL (ref 0.00–0.07)
Basophils Absolute: 0.1 10*3/uL (ref 0.0–0.1)
Basophils Relative: 1 %
Eosinophils Absolute: 0.2 10*3/uL (ref 0.0–0.5)
Eosinophils Relative: 4 %
HCT: 49.5 % (ref 39.0–52.0)
Hemoglobin: 16.5 g/dL (ref 13.0–17.0)
Immature Granulocytes: 0 %
Lymphocytes Relative: 34 %
Lymphs Abs: 1.7 10*3/uL (ref 0.7–4.0)
MCH: 28.9 pg (ref 26.0–34.0)
MCHC: 33.3 g/dL (ref 30.0–36.0)
MCV: 86.8 fL (ref 80.0–100.0)
Monocytes Absolute: 0.3 10*3/uL (ref 0.1–1.0)
Monocytes Relative: 5 %
Neutro Abs: 2.9 10*3/uL (ref 1.7–7.7)
Neutrophils Relative %: 56 %
Platelets: 270 10*3/uL (ref 150–400)
RBC: 5.7 MIL/uL (ref 4.22–5.81)
RDW: 12.6 % (ref 11.5–15.5)
WBC: 5.1 10*3/uL (ref 4.0–10.5)
nRBC: 0 % (ref 0.0–0.2)

## 2019-06-22 LAB — CMP (CANCER CENTER ONLY)
ALT: 18 U/L (ref 0–44)
AST: 20 U/L (ref 15–41)
Albumin: 4 g/dL (ref 3.5–5.0)
Alkaline Phosphatase: 58 U/L (ref 38–126)
Anion gap: 8 (ref 5–15)
BUN: 14 mg/dL (ref 6–20)
CO2: 27 mmol/L (ref 22–32)
Calcium: 9 mg/dL (ref 8.9–10.3)
Chloride: 107 mmol/L (ref 98–111)
Creatinine: 0.86 mg/dL (ref 0.61–1.24)
GFR, Est AFR Am: >60 mL/min (ref >60)
GFR, Estimated: >60 mL/min (ref >60)
Glucose, Bld: 95 mg/dL (ref 70–99)
Potassium: 4.3 mmol/L (ref 3.5–5.1)
Sodium: 142 mmol/L (ref 135–145)
Total Bilirubin: 0.7 mg/dL (ref 0.3–1.2)
Total Protein: 6.8 g/dL (ref 6.5–8.1)

## 2019-06-22 NOTE — Telephone Encounter (Signed)
Scheduled per los. Gave avs and contrast

## 2019-06-22 NOTE — Progress Notes (Signed)
HEMATOLOGY/ONCOLOGY CLINIC NOTE  Date of Service: 06/22/2019  Patient Care Team: Patient, No Pcp Per as PCP - General (Benjamin Perez) Brunetta Genera, MD as Consulting Physician (Hematology) Eppie Gibson, MD as Consulting Physician (Radiation Oncology) Letta Moynahan Maryelizabeth Rowan, MD as Consulting Physician (Radiation Oncology)   CHIEF COMPLAINTS/PURPOSE OF CONSULTATION:  F/u for Primary mediastinal B cell Non hodgkins lymphoma   HISTORY OF PRESENTING ILLNESS:  Nicholas Moreno is a wonderful 24 y.o. 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 Patient notes that he started feeling unwell in August when he noted gradually increasing fatigue and weight loss.  Patient notes subsequently in September and October he developed new cough with progressive shortness of breath which triggered multiple visits with his primary care physician and was treated symptomatically and with inhalers.  Patient reports no imaging studies at the time.  Patient reports he has felt poorly during the last 2 to 3 months and during his recent visits to the [place] in [place]. He reports a total unintentional weight loss of close to 20 pounds. He also reports drenching night sweats over the last 4 to 6 weeks.  He reports that his cough and shortness of breath progressively got much more severe which led to him presenting to the emergency room yesterday. Notes nonproductive cough. No overt fevers.  Patient had a CTA of the chest on 03/07/2018 which showed  "Small acute pulmonary emboli identified within the distal left upper lobe lobar pulmonary artery. 2. Very large partially cavitary  anterior mediastinal mass is identified which encases the great vessels and its branches. Primary differential considerations include lymphoma and leukemia as well as malignant derm cell tumors. 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 Narrowing and displacement of the trachea with complete occlusion of the upper lobe bronchi. There is also marked narrowing of bilateral upper lobe pulmonary arteries. 3. Significantly diminished aeration to both upper lobes, left greater than right. 4. Moderate left pleural effusion."  Patient is currently on room air and saturating 99% with stable hemodynamics. He has been started on IV heparin due to his small pulmonary embolism. 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  No headaches.  No facial swelling.  No upper extremity swelling.  No overt clinical symptomatology of SVC syndrome despite radiographic findings of some SVC narrowing.  No overt chest pain at this time.   Interval History:   Nicholas Moreno returns today for management and evaluation of his Primary mediastinal B cell Non hodgkins lymphoma after completing 6 planned cycles of EPOCH-R. The patient's last visit with [place] was on 03/23/2019. The pt reports that he is doing well overall.  The pt reports that he has not had the chance to receive the COVID19 vaccine yet. He has otherwise been well and denies any new concerns or symptoms. Pt's work keeps him active and he continues to take pandemic precautions.   Lab results today (06/22/19) of CBC w/diff and CMP is as follows: all values are WNL.  On review of systems, pt denies fevers, chills, night sweats, unexpected weight loss, SOB, new lumps/bumps and any other symptoms.   MEDICAL  HISTORY:  No past medical history on file.  SURGICAL HISTORY: Past Surgical History:  Procedure Laterality Date  . IR IMAGING GUIDED PORT INSERTION  04/22/2018  . IR REMOVAL TUN ACCESS W/ PORT W/O FL MOD SED  04/20/2019  . MEDIASTINOSCOPY N/A 03/10/2018   Procedure: MEDIASTINOSCOPY;  Surgeon: Ivin Poot, MD;  Location: [place];  Service: Thoracic;  Laterality: N/A;  . VIDEO ASSISTED THORACOSCOPY (VATS)/ LYMPH NODE SAMPLING Left 03/10/2018   Procedure: VIDEO ASSISTED THORACOSCOPY (VATS)/ BIOSPY ANTERIOR APPROACH;  Surgeon: Ivin Poot, MD;  Location: [place];  Service: Thoracic;  Laterality: Left;  Marland Kitchen VIDEO BRONCHOSCOPY Left 03/10/2018   Procedure: VIDEO BRONCHOSCOPY;  Surgeon: Ivin Poot, MD;  Location[facility];  Service: Thoracic;  Laterality: Left;    SOCIAL HISTORY: Social History   Socioeconomic History  . Marital status: Single    Spouse name: Not on file  . Number of children: Not on file  . Years of education: Not on file  . Highest education level: Not on file  Occupational History  . Not on file  Tobacco Use  . Smoking status: Never Smoker  . Smokeless tobacco: Never Used  Substance and Sexual Activity  . Alcohol use: Yes    Comment: socially.   . Drug use: Never  . Sexual activity: Not on file  Other Topics Concern  . Not on file  Social History Narrative  . Not on file   Social Determinants of Health   Financial Resource Strain:   . Difficulty of Paying Living Expenses:   Food Insecurity:   . Worried About Charity fundraiser in the Last Year:   . Arboriculturist in the Last Year:   Transportation Needs: No Transportation Needs  . Lack of Transportation (Medical): No  . Lack of Transportation (Non-Medical): No  Physical Activity:   . Days of Exercise per Week:   . Minutes of Exercise per Session:   Stress:   . Feeling of Stress :   Social Connections:   . Frequency of Communication with Friends and Family:   . Frequency of Social Gatherings  with Friends and Family:   . Attends Religious Services:   . Active Member of Clubs or Organizations:   . Attends Archivist Meetings:   Marland Kitchen Marital Status:   Intimate Partner Violence: Not At Risk  . Fear of Current or Ex-Partner: No  . Emotionally Abused: No  . Physically Abused: No  . Sexually Abused: No    FAMILY HISTORY: No family history on file.   ALLERGIES:  has No Known Allergies.   MEDICATIONS:  Current Outpatient Medications  Medication Sig Dispense Refill  . ELIQUIS 5 MG TABS tablet TAKE 1 TABLET(5 MG) BY MOUTH TWICE DAILY 60 tablet 4  . polyethylene glycol (MIRALAX / GLYCOLAX) packet Take 17 g by mouth daily as needed. (Patient not taking: Reported on 03/23/2019) 14 each 0   No current facility-administered medications for this visit.    REVIEW OF SYSTEMS:   A 10+ POINT REVIEW OF SYSTEMS WAS OBTAINED including neurology, dermatology, psychiatry, cardiac, respiratory, lymph, extremities, GI, GU, Musculoskeletal, constitutional, breasts, reproductive, HEENT.  All pertinent positives are noted in the HPI.  All others are negative.   PHYSICAL EXAMINATION: ECOG PERFORMANCE STATUS: 1 - Symptomatic but completely ambulatory  Vitals:   06/22/19 0859  BP: 104/65  Pulse: 64  Resp: 18  Temp: 97.8 F (36.6 C)  SpO2: 99%   Filed Weights   06/22/19 0859  Weight: 132 lb 11.2 oz (60.2 kg)   Body mass index is 21.42 kg/m.   GENERAL:alert, in no acute distress and comfortable SKIN: no acute rashes, no significant lesions EYES: conjunctiva are pink and non-injected, sclera anicteric OROPHARYNX: MMM, no exudates, no oropharyngeal erythema or ulceration NECK: supple, no JVD LYMPH:  no palpable lymphadenopathy in the cervical, axillary or inguinal regions LUNGS: clear to auscultation b/l with normal respiratory effort HEART: regular rate & rhythm ABDOMEN:  normoactive bowel sounds , non tender, not distended. No palpable hepatosplenomegaly.  Extremity: no  pedal edema PSYCH: alert & oriented x 3 with fluent speech NEURO: no focal motor/sensory deficits  LABORATORY DATA:  I have reviewed the data as listed  . CBC Latest Ref Rng & Units 06/22/2019 04/20/2019 03/16/2019  WBC 4.0 - 10.5 K/uL 5.1 5.7 5.9  Hemoglobin 13.0 - 17.0 g/dL 16.5 17.4(H) 16.7  Hematocrit 39.0 - 52.0 % 49.5 52.8(H) 49.2  Platelets 150 - 400 K/uL 270 293 303    . CMP Latest Ref Rng & Units 06/22/2019 03/16/2019 12/22/2018  Glucose 70 - 99 mg/dL 95 95 89  BUN 6 - 20 mg/dL 14 15 13   Creatinine 0.61 - 1.24 mg/dL 0.86 0.85 0.94  Sodium 135 - 145 mmol/L 142 141 139  Potassium 3.5 - 5.1 mmol/L 4.3 3.9 4.3  Chloride 98 - 111 mmol/L 107 105 105  CO2 22 - 32 mmol/L 27 26 26   Calcium 8.9 - 10.3 mg/dL 9.0 9.1 10.0  Total Protein 6.5 - 8.1 g/dL 6.8 7.0 7.2  Total Bilirubin 0.3 - 1.2 mg/dL 0.7 0.6 0.8  Alkaline Phos 38 - 126 U/L 58 72 90  AST 15 - 41 U/L 20 27 22   ALT 0 - 44 U/L 18 29 26    03/10/18 Biopsy:     03/10/18 Tissue Flow Cytometry:     RADIOGRAPHIC STUDIES: I have personally reviewed the radiological images as listed and agreed with the findings in the report. No results found.  ASSESSMENT & PLAN:   24 y.o. male with  #1Primary Mediastinal B cell Non Hodgkins lymphoma c-MYC negative Not a double hit lymphoma Pathology confirmed by pathology read at [facility].  S/p 6 cycles of dose adjusted EPOCH-R completed on 07/04/18  10/13/2018 PET showing continued further reduction in size and metabolism of the heterogeneous density anterior mediastinal/paramediastinal mass. Persistent Deauville 3 activity. No metabolic evidence of new or progressive lymphoma.  03/16/2019 CT Chest ([id number]) revealed "1. Slight interval decrease in size of bulky, hypodense pleural based masses of the paramedian left and right upper lobes, confluent with the adjacent mediastinum and demonstrating continued necrotic cavitation. Left sided mass measures 4.6 x 4.6 cm,  previously 5.3 x 5.0 cm when measured similarly (series 2, image 67). Right-sided mass measures 3.0 x 1.7 cm, previously 3.8 x 1.9 cm when measured similarly (series 2, image 55). Findings are consistent with ongoing treatment response. 2. No significant change in post treatment appearance of anterior mediastinal soft tissue and lymph nodes, including nodes with peripheral dystrophic calcification, largest in the right aspect of the anterior mediastinum measuring 2.6 x 1.6 cm (series 2, image 54)."  #2 s/p SVC compression due to mediastinal mass without overt SVC syndrome clinical symptoms #3 small acute distal left upper lobe pulmonary embolus-on lovenox #4 Acute Left IJ and Subclavian DVT due to left sided  venous compression due to mass and due to malignancy -wason lovenox for malignancy related thrombosis-- now changed to Eliquis #4 left-sided pleural effusion and left-sided lung atelectasis due to airway compression from mediastinal mass -resolved on CXR   PLAN: -Discussed pt labwork today, 06/22/19; blood counts and chemistries are normal -Pt is now 12 months out from treatment -No clinical or laboratory evidence of active lymphoma at this time -Recommend pt receive the COVID19 vaccine when available  -Gave pt instructions on how to find the first available appointment for the COVID19 vaccine -Plan to transfer his ongoing care when he moves. Pt is planning on moving in June.  -Recommended pt continue 81 mg enteric coated baby Asprin for 3 months -Will rpt CT Chest  in 11 weeks -Will see back in 12 weeks with labs    FOLLOW UP: CT chest in 11 weeks RTC with Dr Irene Limbo with labs in 12 weeks  The total time spent in the appt was 20 minutes and more than 50% was on counseling and direct patient cares.  All of the patient's questions were answered with apparent satisfaction. The patient knows to call the clinic with any problems, questions or concerns.    Sullivan Lone MD [place] AAHIVMS [facility]  [facility]On[facility] Physician [facility]  (Office):       [phone] (Work cell):  [phone] (Fax):           [phone]  06/22/2019 9:26 AM  I, Yevette Edwards, am acting as a scribe for Dr. Sullivan Lone.   .I have reviewed the above documentation for accuracy and completeness, and I agree with the above. Brunetta Genera MD

## 2019-07-20 ENCOUNTER — Encounter: Payer: Self-pay | Admitting: Hematology

## 2019-07-21 ENCOUNTER — Telehealth: Payer: Self-pay | Admitting: *Deleted

## 2019-07-21 ENCOUNTER — Encounter: Payer: Self-pay | Admitting: Hematology

## 2019-07-21 NOTE — Telephone Encounter (Signed)
Contacted patient with Dr. Grier Mitts response to MyChart message: per Dr. Irene Limbo, Can do CT chest sooner than May -  in the next 1 week -and see him after that. If he becomes acutely SOB - will need to go to ED. Gave patient number for [facility] Sched[facility] as he wants to make his appt. He will contact office once appt made so that appt with Dr. Irene Limbo can be made.

## 2019-07-23 ENCOUNTER — Other Ambulatory Visit: Payer: Self-pay

## 2019-07-23 ENCOUNTER — Telehealth: Payer: Self-pay | Admitting: *Deleted

## 2019-07-23 ENCOUNTER — Encounter: Payer: Self-pay | Admitting: *Deleted

## 2019-07-23 ENCOUNTER — Encounter: Payer: Self-pay | Admitting: Hematology

## 2019-07-23 ENCOUNTER — Encounter (HOSPITAL_COMMUNITY): Payer: Self-pay

## 2019-07-23 ENCOUNTER — Emergency Department (HOSPITAL_COMMUNITY)
Admission: EM | Admit: 2019-07-23 | Discharge: 2019-07-23 | Disposition: A | Discharge status: Home / Self Care | Payer: Self-pay | Attending: Emergency Medicine | Admitting: Emergency Medicine

## 2019-07-23 ENCOUNTER — Emergency Department (HOSPITAL_COMMUNITY): Payer: Self-pay

## 2019-07-23 DIAGNOSIS — R0781 Pleurodynia: Secondary | ICD-10-CM | POA: Insufficient documentation

## 2019-07-23 DIAGNOSIS — Z86718 Personal history of other venous thrombosis and embolism: Secondary | ICD-10-CM | POA: Insufficient documentation

## 2019-07-23 DIAGNOSIS — Z8572 Personal history of non-Hodgkin lymphomas: Secondary | ICD-10-CM | POA: Insufficient documentation

## 2019-07-23 DIAGNOSIS — R0602 Shortness of breath: Secondary | ICD-10-CM | POA: Insufficient documentation

## 2019-07-23 LAB — BASIC METABOLIC PANEL
Anion gap: 13 (ref 5–15)
BUN: 10 mg/dL (ref 6–20)
CO2: 25 mmol/L (ref 22–32)
Calcium: 9.3 mg/dL (ref 8.9–10.3)
Chloride: 102 mmol/L (ref 98–111)
Creatinine, Ser: 0.79 mg/dL (ref 0.61–1.24)
GFR calc Af Amer: >60 mL/min (ref >60)
GFR calc non Af Amer: >60 mL/min (ref >60)
Glucose, Bld: 92 mg/dL (ref 70–99)
Potassium: 4.2 mmol/L (ref 3.5–5.1)
Sodium: 140 mmol/L (ref 135–145)

## 2019-07-23 LAB — TROPONIN I (HIGH SENSITIVITY)
Troponin I (High Sensitivity): 2 ng/L (ref <18)
Troponin I (High Sensitivity): 4 ng/L (ref <18)

## 2019-07-23 LAB — CBC
HCT: 47.1 % (ref 39.0–52.0)
Hemoglobin: 15.4 g/dL (ref 13.0–17.0)
MCH: 28.7 pg (ref 26.0–34.0)
MCHC: 32.7 g/dL (ref 30.0–36.0)
MCV: 87.7 fL (ref 80.0–100.0)
Platelets: 392 10*3/uL (ref 150–400)
RBC: 5.37 MIL/uL (ref 4.22–5.81)
RDW: 12.1 % (ref 11.5–15.5)
WBC: 8.1 10*3/uL (ref 4.0–10.5)
nRBC: 0 % (ref 0.0–0.2)

## 2019-07-23 MED ORDER — SODIUM CHLORIDE 0.9% FLUSH: 3.0000 mL | Freq: Once | INTRAVENOUS | Status: DC

## 2019-07-23 MED ORDER — IOHEXOL 350 MG/ML SOLN
100.0000 mL | Freq: Once | INTRAVENOUS | Status: AC | PRN
  Administered 2019-07-23: 100 mL via INTRAVENOUS

## 2019-07-23 NOTE — Telephone Encounter (Signed)
Dr. Irene Limbo reviewed patient's my chart messages. He asked that patient be contacted and directed to go to ED now for evaluation. Contacted patient - he is in route to ED. He states his temp has been 99-100 F and he is increasingly fatigued and cannot get a deep breath. Dr. Irene Limbo informed

## 2019-07-23 NOTE — ED Triage Notes (Signed)
Pt arrives to ED w/ c/o SOB that has been getting progressively worse since last Tuesday. Pt states that is when he got his covid vaccine. Pt also endorses intermittent fevers since then.

## 2019-07-23 NOTE — ED Provider Notes (Signed)
[facility] EMERGENCY DEPARTMENT Provider Note   CSN: [id number] Arrival date & time: 07/23/19  1055     History Chief Complaint  Patient presents with  . Shortness of Breath    Nicholas Moreno is a 24 y.o. male.  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  Patient notes that 9 days ago he received his Covid vaccine.  Patient reports having intermittent fevers since that time.  He measured his temperature from 99 to 100 F.  Patient is specifically concerned about pulmonary embolism.  Patient notes that 5 days ago he developed sudden onset of shortness of breath that is progressively worsened.  Prior to this the patient reported feeling fatigued with a subjective fevers.  Patient notes having a dry cough but no hemoptysis.  Patient denies any abdominal pain, changes in bowel bladder function, chest pain, back pain, lightheadedness or presyncope.  Patient notes that taking a deep breath feels "restrictive" with associated chest tightness.  Patient has no lower extremity swelling or pain, hormone therapy use, recent long trips or flights, recent surgeries.  The patient did not receive radiation during his cancer therapy.  History reviewed. No pertinent past medical history.  Patient Active Problem List   Diagnosis Date Noted  . Port-A-Cath in place 06/03/2018  . 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 Hypokalemia   . Large cell lymphoma ([place]) 04/10/2018  . Malignant pleural effusion   . Acute deep vein thrombosis (DVT) of left upper extremity ([place])   . Encounter for antineoplastic chemotherapy   . 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 Counseling regarding advance care planning and goals of care 03/14/2018  . Lymphoma of intrathoracic lymph nodes ([place])   . Malnutrition of moderate degree 03/12/2018  . Single subsegmental pulmonary embolism without acute cor pulmonale ([place])   . Superior vena cava syndrome   . S/P thoracentesis   . Mediastinal mass 03/07/2018    Past Surgical History:  Procedure Laterality Date  . IR IMAGING GUIDED PORT INSERTION  04/22/2018  . IR REMOVAL TUN ACCESS W/ PORT W/O FL MOD SED  04/20/2019  . MEDIASTINOSCOPY N/A 03/10/2018   Procedure: MEDIASTINOSCOPY;  Surgeon: Ivin Poot, MD;  Location: [place];  Service: Thoracic;  Laterality: N/A;  . VIDEO ASSISTED THORACOSCOPY (VATS)/ LYMPH NODE SAMPLING Left 03/10/2018   Procedure: VIDEO ASSISTED THORACOSCOPY (VATS)/ BIOSPY ANTERIOR APPROACH;  Surgeon: Ivin Poot, MD;  Location: [facility];  Service: Thoracic;  Laterality: Left;  Marland Kitchen VIDEO BRONCHOSCOPY Left 03/10/2018   Procedure: VIDEO BRONCHOSCOPY;  Surgeon: Ivin Poot, MD;  Location: [place];  Service: Thoracic;  Laterality: Left;       History reviewed. No pertinent family history.  Social History   Tobacco Use  . Smoking status: Never Smoker  . Smokeless tobacco: Never Used  Substance Use Topics  . Alcohol use: Yes    Comment: socially.   . Drug use: Never    Home Medications Prior to Admission medications   Medication Sig Start Date End Date Taking? Authorizing Provider  ELIQUIS 5 MG TABS tablet TAKE 1 TABLET(5 MG) BY MOUTH TWICE DAILY Patient not taking: Reported on 06/22/2019 10/02/18  Brunetta Genera, MD  polyethylene glycol [facility] Floria Raveling) packet Take 17 g by mouth daily as needed. Patient not taking: Reported on 03/23/2019 07/04/18   Maryanna Shape, NP    Allergies    Patient has no known allergies.  Review of Systems   Review of Systems  Constitutional: Positive for fatigue and  fever. Negative for activity change, appetite change, chills and diaphoresis.  HENT: Negative for congestion and rhinorrhea.   Respiratory: Positive for cough, chest tightness and shortness of breath. Negative for wheezing.   Cardiovascular: Negative for chest pain, palpitations and leg swelling.  Gastrointestinal: Negative for abdominal distention, abdominal pain, constipation, diarrhea, nausea and vomiting.  Genitourinary: Negative for decreased urine volume, dysuria and urgency.  Musculoskeletal: Negative for back pain and gait problem.  Skin: Negative for wound.  Neurological: Negative for dizziness, syncope, weakness, light-headedness and headaches.  All other systems reviewed and are negative.   Physical Exam Updated Vital Signs BP 107/69   Pulse 69   Temp 98.1 F (36.7 C) (Oral)   Resp 14   Ht 5\' 6"  (1.676 m)   Wt 58.5 kg   SpO2 100%   BMI 20.82 kg/m   Physical Exam Vitals and nursing note reviewed.  Constitutional:      General: He is not in acute distress.    Appearance: Normal appearance. He is normal weight. He is not ill-appearing or toxic-appearing.  HENT:     Head: Normocephalic.     Right Ear: External ear normal.     Left Ear: External ear normal.     Nose: Nose normal.     Mouth/Throat:     Mouth: Mucous membranes are moist.     Pharynx: Oropharynx is clear.  Eyes:     Extraocular Movements: Extraocular movements intact.  Cardiovascular:     Rate and Rhythm: Normal rate and regular rhythm.     Pulses: Normal pulses.     Heart sounds: Normal heart sounds.  Pulmonary:     Effort: Pulmonary effort is normal. No tachypnea, accessory muscle usage or respiratory distress.     Breath sounds: Normal breath sounds. No decreased breath sounds, wheezing, rhonchi or rales.  Abdominal:     General: Bowel sounds are normal.     Palpations: Abdomen is soft.     Tenderness: There is no abdominal tenderness. There is no guarding.  Musculoskeletal:     Cervical  back: Normal range of motion.     Right lower leg: No edema.     Left lower leg: No edema.  Skin:    General: Skin is warm and dry.     Capillary Refill: Capillary refill takes less than 2 seconds.  Neurological:     General: No focal deficit present.     Mental Status: He is alert and oriented to person, place, and time. Mental status is at baseline.  Psychiatric:        Mood and Affect: Mood is anxious.     ED Results / Procedures / Treatments   Labs (all labs ordered are listed, but only abnormal results are displayed) Labs Reviewed  BASIC METABOLIC PANEL  CBC  TROPONIN I (HIGH SENSITIVITY)  TROPONIN I (HIGH SENSITIVITY)    EKG EKG Interpretation  Date/Time:  Thursday July 23 2019 11:18:27 EDT Ventricular Rate:  75 PR Interval:  126 QRS Duration: 98 QT Interval:  360 QTC Calculation: 402 R Axis:   90 Text Interpretation: Normal sinus rhythm Rightward axis Incomplete right bundle  branch block Cannot rule out Anterior infarct , age undetermined Abnormal ECG Since last tracing rate slower Confirmed by Isla Pence [phone]) on 07/23/2019 3:31:09 PM   Radiology DG Chest 2 View  Result Date: 07/23/2019 CLINICAL DATA:  Mediastinal B-cell lymphoma. Short of breath 6 days. EXAM: CHEST - 2 VIEW COMPARISON:  04/11/2018 FINDINGS: Large mediastinal mass has decreased in size significantly since the prior study. The mass is larger on the left than the right. No pneumothorax.  No acute infiltrate or effusion. IMPRESSION: Interval improvement in mediastinal mass. No superimposed acute abnormality. Electronically Signed   By: Franchot Gallo M.D.   On: 07/23/2019 11:37   CT Angio Chest PE W and/or Wo Contrast  Result Date: 07/23/2019 CLINICAL DATA:  Shortness of breath following COVID vaccine EXAM: CT ANGIOGRAPHY CHEST WITH CONTRAST TECHNIQUE: Multidetector CT imaging of the chest was performed using the standard protocol during bolus administration of intravenous contrast. Multiplanar  CT image reconstructions and MIPs were obtained to evaluate the vascular anatomy. CONTRAST:  141mL OMNIPAQUE IOHEXOL 350 MG/ML SOLN COMPARISON:  Chest x-ray from earlier in the same day. FINDINGS: Cardiovascular: Thoracic aorta is well visualize without aneurysmal dilatation or dissection. No atherosclerotic changes are seen. No cardiac enlargement is noted. The pulmonary artery is well visualized within normal branching pattern. The left upper lobe pulmonary arterial branches are somewhat attenuated by the known mediastinal mass. No definitive filling defect to suggest pulmonary embolism is seen. Mediastinum/Nodes: Thoracic inlet is within normal limits. Persistent left and anterior mediastinal mass lesion is noted with associated lingular and upper lobe consolidation. The mass lesion in a transverse dimension has decreased in size although now demonstrates some increased AP dimension when compared with the prior study. It extends towards the left hilum likely somewhat accentuated by the surrounding consolidation. Anterior mediastinal lesion measures 1.6 x 2.7 cm which is relatively stable from the prior exam. The esophagus is within normal limits. Lungs/Pleura: Right lung is clear. The left lung demonstrates increasing consolidation in the upper lobe when compared with the prior exam. This somewhat accentuates the size of the mediastinal mass as previously described. Upper Abdomen: Visualized upper abdomen shows no focal abnormality. Musculoskeletal: Bony structures are within normal limits. Review of the MIP images confirms the above findings. IMPRESSION: Increase in the degree of left upper lobe consolidation related to the known mediastinal mass lesion. This somewhat accentuates the size of the mass although overall it is likely stable in appearance. No evidence of pulmonary emboli. Some attenuation of the upper lobe pulmonary arterial branches secondary to the mass and consolidation is seen. Electronically  Signed   By: Inez Catalina M.D.   On: 07/23/2019 19:31    Procedures Procedures (including critical care time)  Medications Ordered in ED Medications  sodium chloride flush (NS) 0.9 % injection 3 mL (has no administration in time range)  iohexol (OMNIPAQUE) 350 MG/ML injection 100 mL (100 mLs Intravenous Contrast Given 07/23/19 1847)    ED Course  I have reviewed the triage vital signs and the nursing notes.  Pertinent labs & imaging results that were available during my care of the patient were reviewed by me and considered in my medical decision making (see chart for details).    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   Differential diagnoses considered include anxiety, electrolyte derangements, anemia, pneumonia, postvaccination syndrome, superior vena cava syndrome, malignant pleural effusion, peritonitis/myocarditis, PE, less likely PTX, low suspicion for ACS or dissection  EKG interpreted by me demonstrates normal sinus rhythm at 75 bpm with slight rightward axis deviation, RSR' pattern in the precordial leads, incomplete RBBB, PRWP, T wave inversions in V1 and V2 consistent with a pediatric EKG, no ST or T wave changes suggestive of acute ischemia, overall similar EKG compared to previous on 03/07/2018 though RSR prime pattern and right axis deviation appear new  CXR interpreted by me demonstrated interval improvement in the mediastinal mass with no superimposed acute abnormalities compared to previous on 04/11/2018  Labs demonstrated no significant derangement on BMP, unremarkable CBC without any significant leukocytosis, anemia, thrombocytopenia or thrombocytosis, initial troponin negative with a  negative delta troponin as well  CTA PE study demonstrated slight increase in the degree of left upper lobe consolidation related to the known mediastinal mass overall stable.,  No evidence of pulmonary emboli.  Upon reassessment, patient has not had recurrence of symptoms.  Given the above findings, my suspicion is that patient likely had some shortness of breath potentially related to post vaccination symptoms that led to some anxiety and concern related to his previous cancer diagnosis and possible PE.  Reassured the patient that he does not have any acute emergent pathology causing his symptoms currently.  Provided the patient with strict return precautions.  Encourage the patient to follow-up with his regular doctor for reevaluation to encourage that he is continuing to improve.  The patient is safe and stable for discharge at this time with return precautions provided and a plan for follow up care in place as needed  The plan for this patient was discussed with Dr. Gilford Raid, who voiced agreement and who oversaw evaluation and treatment of this patient.  Final Clinical Impression(s) / ED Diagnoses Final diagnoses:  Shortness of breath    Rx / DC Orders ED Discharge Orders    None       Filbert Berthold, MD 07/23/19 2009    Isla Pence, MD 07/23/19 2027

## 2019-07-28 ENCOUNTER — Ambulatory Visit (HOSPITAL_COMMUNITY): Payer: Self-pay

## 2019-07-28 NOTE — Progress Notes (Signed)
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 Patient notes that he started feeling unwell in August when he noted gradually increasing fatigue and weight loss.  Patient notes subsequently in September and October he developed new cough with progressive shortness of breath which triggered multiple visits with his primary care physician and was treated symptomatically and with inhalers.  Patient reports no imaging studies at the time.  Patient reports he has felt poorly during the last 2 to 3 months and during his recent visits to the [place] in [place]. He reports a total unintentional weight loss of close to 20 pounds. He also reports drenching night sweats over the last 4 to 6 weeks.  He reports that his cough and shortness of breath progressively got much more severe which led to him presenting to the emergency room yesterday. Notes nonproductive cough. No overt fevers.  Patient had a CTA of the chest on 03/07/2018 which showed  "Small acute pulmonary emboli identified within the distal left upper lobe lobar pulmonary artery. 2. Very large partially cavitary  anterior mediastinal mass is identified which encases the great vessels and its branches. Primary differential considerations include lymphoma and leukemia as well as malignant derm cell tumors. 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 Narrowing and displacement of the trachea with complete occlusion of the upper lobe bronchi. There is also marked narrowing of bilateral upper lobe pulmonary arteries. 3. Significantly diminished aeration to both upper lobes, left greater than right. 4. Moderate left pleural effusion."  Patient is currently on room air and saturating 99% with stable hemodynamics. He has been started on IV heparin due to his small pulmonary embolism. 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  No headaches.  No facial swelling.  No upper extremity swelling.  No overt clinical symptomatology of SVC syndrome despite radiographic findings of some SVC narrowing.  No overt chest pain at this time.   Interval History:   Nicholas Moreno returns today for management and evaluation of his Primary mediastinal B cell Non hodgkins lymphoma after completing 6 planned cycles of EPOCH-R. The patient's last visit with [place] was on 06/22/2019. The pt reports that he is doing well overall.  The pt reports that he received his Moderna vaccine about two weeks ago and began having fatigue, chills, low-grade fever, chest tightness, dry cough, shallow breathing, and wheezing 3-4 days afterwards. He had no nasal congestion at this time. He denies any residual symptoms. Pt notes that every Spring he does experience a period of time where he has  a low-grade fever due to allergies. Pt is currently taking Claritin to assist with is ongoing eye allergy  symptoms.   Of note since the patient's last visit, pt has had CT Angio Chest ([id number]) completed on 07/23/2019 with results revealing "Increase in the degree of left upper lobe consolidation related to the known mediastinal mass lesion. This somewhat accentuates the size of the mass although overall it is likely stable in appearance. No evidence of pulmonary emboli. Some attenuation of the upper lobe pulmonary arterial branches secondary to the mass and consolidation is seen."  Lab results today (07/29/19) of CBC w/diff and CMP is as follows: all values are WNL except for RBC at 5.92, PLT at 426K, ALT at 54. 07/29/2019 LDH at 160   On review of systems, pt denies SOB, chest pain, rashes, cough, fevers, chills, unexpected weight loss, abdominal pain and any other symptoms.   MEDICAL HISTORY:  No past medical history on file.  SURGICAL HISTORY: Past Surgical History:  Procedure Laterality Date  . IR IMAGING GUIDED PORT INSERTION  04/22/2018  . IR REMOVAL TUN ACCESS W/ PORT W/O FL MOD SED  04/20/2019  . MEDIASTINOSCOPY N/A 03/10/2018   Procedure: MEDIASTINOSCOPY;  Surgeon: Ivin Poot, MD;  Location: [facility];  Service: Thoracic;  Laterality: N/A;  . VIDEO ASSISTED THORACOSCOPY (VATS)/ LYMPH NODE SAMPLING Left 03/10/2018   Procedure: VIDEO ASSISTED THORACOSCOPY (VATS)/ BIOSPY ANTERIOR APPROACH;  Surgeon: Ivin Poot, MD;  Location: [facility];  Service: Thoracic;  Laterality: Left;  Marland Kitchen VIDEO BRONCHOSCOPY Left 03/10/2018   Procedure: VIDEO BRONCHOSCOPY;  Surgeon: Ivin Poot, MD;  Location: [facility] OR;  Service: Thoracic;  Laterality: Left;    SOCIAL HISTORY: Social History   Socioeconomic History  . Marital status: Single    Spouse name: Not on file  . Number of children: Not on file  . Years of education: Not on file  . Highest education level: Not on file  Occupational History  . Not on file  Tobacco Use  . Smoking status: Never Smoker  . Smokeless tobacco: Never Used  Substance  and Sexual Activity  . Alcohol use: Yes    Comment: socially.   . Drug use: Never  . Sexual activity: Not on file  Other Topics Concern  . Not on file  Social History Narrative  . Not on file   Social Determinants of Health   Financial Resource Strain:   . Difficulty of Paying Living Expenses:   Food Insecurity:   . Worried About Charity fundraiser in the Last Year:   . Arboriculturist in the Last Year:   Transportation Needs: No Transportation Needs  . Lack of Transportation (Medical): No  . Lack of Transportation (Non-Medical): No  Physical Activity:   . Days of Exercise per Week:   . Minutes of Exercise per Session:   Stress:   . Feeling of Stress :   Social Connections:   . Frequency of Communication with Friends and Family:   . Frequency of Social Gatherings with Friends and Family:   . Attends Religious Services:   . Active Member of Clubs or Organizations:   . Attends Archivist Meetings:   Marland Kitchen Marital Status:   Intimate Partner Violence: Not At Risk  . Fear of Current or Ex-Partner: No  . Emotionally Abused: No  . Physically Abused: No  . Sexually Abused: No    FAMILY HISTORY: No family history on file.   ALLERGIES:  has No  Known Allergies.   MEDICATIONS:  Current Outpatient Medications  Medication Sig Dispense Refill  . ELIQUIS 5 MG TABS tablet TAKE 1 TABLET(5 MG) BY MOUTH TWICE DAILY (Patient not taking: Reported on 06/22/2019) 60 tablet 4  . polyethylene glycol (MIRALAX / GLYCOLAX) packet Take 17 g by mouth daily as needed. (Patient not taking: Reported on 03/23/2019) 14 each 0   No current facility-administered medications for this visit.    REVIEW OF SYSTEMS:   A 10+ POINT REVIEW OF SYSTEMS WAS OBTAINED including neurology, dermatology, psychiatry, cardiac, respiratory, lymph, extremities, GI, GU, Musculoskeletal, constitutional, breasts, reproductive, HEENT.  All pertinent positives are noted in the HPI.  All others are negative.    PHYSICAL EXAMINATION: ECOG PERFORMANCE STATUS: 1 - Symptomatic but completely ambulatory  Vitals:   07/29/19 1028  BP: 102/66  Pulse: 75  Resp: 18  Temp: 98.5 F (36.9 C)  SpO2: 100%   Filed Weights   07/29/19 1028  Weight: 132 lb 4.8 oz (60 kg)   Body mass index is 21.35 kg/m.   GENERAL:alert, in no acute distress and comfortable SKIN: no acute rashes, no significant lesions EYES: conjunctiva are pink and non-injected, sclera anicteric OROPHARYNX: MMM, no exudates, no oropharyngeal erythema or ulceration NECK: supple, no JVD LYMPH:  no palpable lymphadenopathy in the cervical, axillary or inguinal regions LUNGS: clear to auscultation b/l with normal respiratory effort HEART: regular rate & rhythm ABDOMEN:  normoactive bowel sounds , non tender, not distended. No palpable hepatosplenomegaly.  Extremity: no pedal edema PSYCH: alert & oriented x 3 with fluent speech NEURO: no focal motor/sensory deficits  LABORATORY DATA:  I have reviewed the data as listed  . CBC Latest Ref Rng & Units 07/29/2019 07/23/2019 06/22/2019  WBC 4.0 - 10.5 K/uL 5.3 8.1 5.1  Hemoglobin 13.0 - 17.0 g/dL 16.5 15.4 16.5  Hematocrit 39.0 - 52.0 % 50.3 47.1 49.5  Platelets 150 - 400 K/uL 426(H) 392 270    . CMP Latest Ref Rng & Units 07/29/2019 07/23/2019 06/22/2019  Glucose 70 - 99 mg/dL 78 92 95  BUN 6 - 20 mg/dL 18 10 14   Creatinine 0.61 - 1.24 mg/dL 0.92 0.79 0.86  Sodium 135 - 145 mmol/L 142 140 142  Potassium 3.5 - 5.1 mmol/L 4.2 4.2 4.3  Chloride 98 - 111 mmol/L 104 102 107  CO2 22 - 32 mmol/L 26 25 27   Calcium 8.9 - 10.3 mg/dL 9.7 9.3 9.0  Total Protein 6.5 - 8.1 g/dL 8.0 - 6.8  Total Bilirubin 0.3 - 1.2 mg/dL 0.4 - 0.7  Alkaline Phos 38 - 126 U/L 95 - 58  AST 15 - 41 U/L 28 - 20  ALT 0 - 44 U/L 54(H) - 18   03/10/18 Biopsy:     03/10/18 Tissue Flow Cytometry:     RADIOGRAPHIC STUDIES: I have personally reviewed the radiological images as listed and agreed with the  findings in the report. DG Chest 2 View  Result Date: 07/23/2019 CLINICAL DATA:  Mediastinal B-cell lymphoma. Short of breath 6 days. EXAM: CHEST - 2 VIEW COMPARISON:  04/11/2018 FINDINGS: Large mediastinal mass has decreased in size significantly since the prior study. The mass is larger on the left than the right. No pneumothorax.  No acute infiltrate or effusion. IMPRESSION: Interval improvement in mediastinal mass. No superimposed acute abnormality. Electronically Signed   By: Franchot Gallo M.D.   On: 07/23/2019 11:37   CT Angio Chest PE W and/or Wo Contrast  Result Date: 07/23/2019 CLINICAL DATA:  Shortness of breath following COVID vaccine EXAM: CT ANGIOGRAPHY CHEST WITH CONTRAST TECHNIQUE: Multidetector CT imaging of the chest was performed using the standard protocol during bolus administration of intravenous contrast. Multiplanar CT image reconstructions and MIPs were obtained to evaluate the vascular anatomy. CONTRAST:  171mL OMNIPAQUE IOHEXOL 350 MG/ML SOLN COMPARISON:  Chest x-ray from earlier in the same day. FINDINGS: Cardiovascular: Thoracic aorta is well visualize without aneurysmal dilatation or dissection. No atherosclerotic changes are seen. No cardiac enlargement is noted. The pulmonary artery is well visualized within normal branching pattern. The left upper lobe pulmonary arterial branches are somewhat attenuated by the known mediastinal mass. No definitive filling defect to suggest pulmonary embolism is seen. Mediastinum/Nodes: Thoracic inlet is within normal limits. Persistent left and anterior mediastinal mass lesion is noted with associated lingular and upper lobe consolidation. The mass lesion in a transverse dimension has decreased in size although now demonstrates some increased AP dimension when compared with the prior study. It extends towards the left hilum likely somewhat accentuated by the surrounding consolidation. Anterior mediastinal lesion measures 1.6 x 2.7 cm which is  relatively stable from the prior exam. The esophagus is within normal limits. Lungs/Pleura: Right lung is clear. The left lung demonstrates increasing consolidation in the upper lobe when compared with the prior exam. This somewhat accentuates the size of the mediastinal mass as previously described. Upper Abdomen: Visualized upper abdomen shows no focal abnormality. Musculoskeletal: Bony structures are within normal limits. Review of the MIP images confirms the above findings. IMPRESSION: Increase in the degree of left upper lobe consolidation related to the known mediastinal mass lesion. This somewhat accentuates the size of the mass although overall it is likely stable in appearance. No evidence of pulmonary emboli. Some attenuation of the upper lobe pulmonary arterial branches secondary to the mass and consolidation is seen. Electronically Signed   By: Inez Catalina M.D.   On: 07/23/2019 19:31    ASSESSMENT & PLAN:   24 y.o. male with  #1Primary Mediastinal B cell Non Hodgkins lymphoma c-MYC negative Not a double hit lymphoma Pathology confirmed by pathology read at [facility].  S/p 6 cycles of dose adjusted EPOCH-R completed on 07/04/18  10/13/2018 PET showing continued further reduction in size and metabolism of the heterogeneous density anterior mediastinal/paramediastinal mass. Persistent Deauville 3 activity. No metabolic evidence of new or progressive lymphoma.  03/16/2019 CT Chest ([id number]) revealed "1. Slight interval decrease in size of bulky, hypodense pleural based masses of the paramedian left and right upper lobes, confluent with the adjacent mediastinum and demonstrating continued necrotic cavitation. Left sided mass measures 4.6 x 4.6 cm, previously 5.3 x 5.0 cm when measured similarly (series 2, image 67). Right-sided mass measures 3.0 x 1.7 cm, previously 3.8 x 1.9 cm when measured similarly (series 2, image 55). Findings are consistent with ongoing treatment response.  2. No significant change in post treatment appearance of anterior mediastinal soft tissue and lymph nodes, including nodes with peripheral dystrophic calcification, largest in the right aspect of the anterior mediastinum measuring 2.6 x 1.6 cm (series 2, image 54)."  #2 s/p SVC compression due to mediastinal mass without overt SVC syndrome clinical symptoms #3 small acute distal left upper lobe pulmonary embolus-on lovenox #4 Acute Left IJ and Subclavian DVT due to left sided venous compression due to mass and due to malignancy -wason lovenox for malignancy related thrombosis, then Eliquis and is now off anticoagulation.   PLAN: -Discussed pt labwork today, 07/29/19; PLT and  ALT are  borderline elevated, LDH is WNL -Discussed 07/23/2019 CT Angio Chest ([id number]) which revealed "Increase in the degree of left upper lobe consolidation related to the known mediastinal mass lesion. This somewhat accentuates the size of the mass although overall it is likely stable in appearance. No evidence of pulmonary emboli. Some attenuation of the upper lobe pulmonary arterial branches secondary to the mass and consolidation is seen." -No clinical or laboratory evidence of active lymphoma at this time -Recommend pt f/u for the second dose of the COVID19 vaccine. Recommend walking at 20-30 minutes per day to improve fatigue. Not unreasonable to take a Tylenol to prevent low-grade fever.  -Plan to transfer his ongoing care when he moves. Pt will be moving to [place] in August  -Will see back in 3 months with labs  -Pt advised to contact if any new respiratory or constitutional symptoms in the interim   FOLLOW UP: RTC with Dr Irene Limbo with labs in 3 months   The total time spent in the appt was 20 minutes and more than 50% was on counseling and direct patient cares.  All of the patient's questions were answered with apparent satisfaction. The patient knows to call the clinic with any problems, questions or  concerns.    Sullivan Lone MD [place] AAHIVMS [facility] Hematology/Oncology Physician [facility]  (Office):       [phone] (Work cell):  [phone] (Fax):           [phone]  07/29/2019 10:49 AM  I, Yevette Edwards, am acting as a scribe for Dr. Sullivan Lone.   .I have reviewed the above documentation for accuracy and completeness, and I agree with the above. Brunetta Genera MD

## 2019-07-29 ENCOUNTER — Inpatient Hospital Stay: Payer: Self-pay

## 2019-07-29 ENCOUNTER — Other Ambulatory Visit: Payer: Self-pay

## 2019-07-29 ENCOUNTER — Inpatient Hospital Stay: Payer: Self-pay | Attending: Hematology | Admitting: Hematology

## 2019-07-29 VITALS — BP 102/66 | HR 75 | Temp 98.5°F | Resp 18 | Ht 66.0 in | Wt 132.3 lb

## 2019-07-29 DIAGNOSIS — Z86718 Personal history of other venous thrombosis and embolism: Secondary | ICD-10-CM | POA: Insufficient documentation

## 2019-07-29 DIAGNOSIS — Z7901 Long term (current) use of anticoagulants: Secondary | ICD-10-CM | POA: Insufficient documentation

## 2019-07-29 DIAGNOSIS — Z86711 Personal history of pulmonary embolism: Secondary | ICD-10-CM | POA: Insufficient documentation

## 2019-07-29 DIAGNOSIS — Z9221 Personal history of antineoplastic chemotherapy: Secondary | ICD-10-CM | POA: Insufficient documentation

## 2019-07-29 DIAGNOSIS — C8522 Mediastinal (thymic) large B-cell lymphoma, intrathoracic lymph nodes: Secondary | ICD-10-CM

## 2019-07-29 LAB — CMP (CANCER CENTER ONLY)
ALT: 54 U/L — ABNORMAL HIGH (ref 0–44)
AST: 28 U/L (ref 15–41)
Albumin: 3.8 g/dL (ref 3.5–5.0)
Alkaline Phosphatase: 95 U/L (ref 38–126)
Anion gap: 12 (ref 5–15)
BUN: 18 mg/dL (ref 6–20)
CO2: 26 mmol/L (ref 22–32)
Calcium: 9.7 mg/dL (ref 8.9–10.3)
Chloride: 104 mmol/L (ref 98–111)
Creatinine: 0.92 mg/dL (ref 0.61–1.24)
GFR, Est AFR Am: >60 mL/min (ref >60)
GFR, Estimated: >60 mL/min (ref >60)
Glucose, Bld: 78 mg/dL (ref 70–99)
Potassium: 4.2 mmol/L (ref 3.5–5.1)
Sodium: 142 mmol/L (ref 135–145)
Total Bilirubin: 0.4 mg/dL (ref 0.3–1.2)
Total Protein: 8 g/dL (ref 6.5–8.1)

## 2019-07-29 LAB — CBC WITH DIFFERENTIAL (CANCER CENTER ONLY)
Abs Immature Granulocytes: 0.01 10*3/uL (ref 0.00–0.07)
Basophils Absolute: 0.1 10*3/uL (ref 0.0–0.1)
Basophils Relative: 1 %
Eosinophils Absolute: 0.3 10*3/uL (ref 0.0–0.5)
Eosinophils Relative: 6 %
HCT: 50.3 % (ref 39.0–52.0)
Hemoglobin: 16.5 g/dL (ref 13.0–17.0)
Immature Granulocytes: 0 %
Lymphocytes Relative: 28 %
Lymphs Abs: 1.5 10*3/uL (ref 0.7–4.0)
MCH: 27.9 pg (ref 26.0–34.0)
MCHC: 32.8 g/dL (ref 30.0–36.0)
MCV: 85 fL (ref 80.0–100.0)
Monocytes Absolute: 0.2 10*3/uL (ref 0.1–1.0)
Monocytes Relative: 4 %
Neutro Abs: 3.2 10*3/uL (ref 1.7–7.7)
Neutrophils Relative %: 61 %
Platelet Count: 426 10*3/uL — ABNORMAL HIGH (ref 150–400)
RBC: 5.92 MIL/uL — ABNORMAL HIGH (ref 4.22–5.81)
RDW: 12.2 % (ref 11.5–15.5)
WBC Count: 5.3 10*3/uL (ref 4.0–10.5)
nRBC: 0 % (ref 0.0–0.2)

## 2019-07-29 LAB — LACTATE DEHYDROGENASE: LDH: 160 U/L (ref 98–192)

## 2019-07-30 ENCOUNTER — Telehealth: Payer: Self-pay | Admitting: Hematology

## 2019-07-30 NOTE — Telephone Encounter (Signed)
Scheduled per 04/21 los, patient has been called and voicemail was left.

## 2019-08-31 ENCOUNTER — Ambulatory Visit (HOSPITAL_COMMUNITY): Payer: Medicaid Other

## 2019-09-14 ENCOUNTER — Inpatient Hospital Stay: Payer: Self-pay

## 2019-09-14 ENCOUNTER — Other Ambulatory Visit: Payer: Self-pay

## 2019-09-14 ENCOUNTER — Inpatient Hospital Stay: Payer: Self-pay | Attending: Hematology | Admitting: Hematology

## 2019-09-14 VITALS — BP 105/61 | HR 63 | Temp 97.7°F | Resp 18 | Ht 66.0 in | Wt 128.7 lb

## 2019-09-14 DIAGNOSIS — Z7901 Long term (current) use of anticoagulants: Secondary | ICD-10-CM | POA: Insufficient documentation

## 2019-09-14 DIAGNOSIS — Z9221 Personal history of antineoplastic chemotherapy: Secondary | ICD-10-CM | POA: Insufficient documentation

## 2019-09-14 DIAGNOSIS — C8522 Mediastinal (thymic) large B-cell lymphoma, intrathoracic lymph nodes: Secondary | ICD-10-CM

## 2019-09-14 DIAGNOSIS — Z86711 Personal history of pulmonary embolism: Secondary | ICD-10-CM | POA: Insufficient documentation

## 2019-09-14 DIAGNOSIS — C8528 Mediastinal (thymic) large B-cell lymphoma, lymph nodes of multiple sites: Secondary | ICD-10-CM

## 2019-09-14 DIAGNOSIS — Z8572 Personal history of non-Hodgkin lymphomas: Secondary | ICD-10-CM | POA: Insufficient documentation

## 2019-09-14 LAB — CBC WITH DIFFERENTIAL/PLATELET
Abs Immature Granulocytes: 0 10*3/uL (ref 0.00–0.07)
Basophils Absolute: 0.1 10*3/uL (ref 0.0–0.1)
Basophils Relative: 1 %
Eosinophils Absolute: 0.2 10*3/uL (ref 0.0–0.5)
Eosinophils Relative: 3 %
HCT: 49.9 % (ref 39.0–52.0)
Hemoglobin: 16.5 g/dL (ref 13.0–17.0)
Immature Granulocytes: 0 %
Lymphocytes Relative: 31 %
Lymphs Abs: 1.7 10*3/uL (ref 0.7–4.0)
MCH: 27.9 pg (ref 26.0–34.0)
MCHC: 33.1 g/dL (ref 30.0–36.0)
MCV: 84.3 fL (ref 80.0–100.0)
Monocytes Absolute: 0.3 10*3/uL (ref 0.1–1.0)
Monocytes Relative: 5 %
Neutro Abs: 3.3 10*3/uL (ref 1.7–7.7)
Neutrophils Relative %: 60 %
Platelets: 267 10*3/uL (ref 150–400)
RBC: 5.92 MIL/uL — ABNORMAL HIGH (ref 4.22–5.81)
RDW: 12.7 % (ref 11.5–15.5)
WBC: 5.5 10*3/uL (ref 4.0–10.5)
nRBC: 0 % (ref 0.0–0.2)

## 2019-09-14 LAB — CMP (CANCER CENTER ONLY)
ALT: 22 U/L (ref 0–44)
AST: 24 U/L (ref 15–41)
Albumin: 4.4 g/dL (ref 3.5–5.0)
Alkaline Phosphatase: 61 U/L (ref 38–126)
Anion gap: 12 (ref 5–15)
BUN: 13 mg/dL (ref 6–20)
CO2: 25 mmol/L (ref 22–32)
Calcium: 9.9 mg/dL (ref 8.9–10.3)
Chloride: 104 mmol/L (ref 98–111)
Creatinine: 1.11 mg/dL (ref 0.61–1.24)
GFR, Est AFR Am: >60 mL/min (ref >60)
GFR, Estimated: >60 mL/min (ref >60)
Glucose, Bld: 81 mg/dL (ref 70–99)
Potassium: 4.6 mmol/L (ref 3.5–5.1)
Sodium: 141 mmol/L (ref 135–145)
Total Bilirubin: 1.3 mg/dL — ABNORMAL HIGH (ref 0.3–1.2)
Total Protein: 7.5 g/dL (ref 6.5–8.1)

## 2019-09-14 LAB — LACTATE DEHYDROGENASE: LDH: 158 U/L (ref 98–192)

## 2019-09-14 NOTE — Progress Notes (Signed)
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 Patient notes that he started feeling unwell in August when he noted gradually increasing fatigue and weight loss.  Patient notes subsequently in September and October he developed new cough with progressive shortness of breath which triggered multiple visits with his primary care physician and was treated symptomatically and with inhalers.  Patient reports no imaging studies at the time.  Patient reports he has felt poorly during the last 2 to 3 months and during his recent visits to the [place] in [place]. He reports a total unintentional weight loss of close to 20 pounds. He also reports drenching night sweats over the last 4 to 6 weeks.  He reports that his cough and shortness of breath progressively got much more severe which led to him presenting to the emergency room yesterday. Notes nonproductive cough. No overt fevers.  Patient had a CTA of the chest on 03/07/2018 which showed  "Small acute pulmonary emboli identified within the distal left upper lobe lobar pulmonary artery. 2. Very large partially cavitary  anterior mediastinal mass is identified which encases the great vessels and its branches. Primary differential considerations include lymphoma and leukemia as well as malignant derm cell tumors. 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 Narrowing and displacement of the trachea with complete occlusion of the upper lobe bronchi. There is also marked narrowing of bilateral upper lobe pulmonary arteries. 3. Significantly diminished aeration to both upper lobes, left greater than right. 4. Moderate left pleural effusion."  Patient is currently on room air and saturating 99% with stable hemodynamics. He has been started on IV heparin due to his small pulmonary embolism. 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  No headaches.  No facial swelling.  No upper extremity swelling.  No overt clinical symptomatology of SVC syndrome despite radiographic findings of some SVC narrowing.  No overt chest pain at this time.   Interval History:   Nicholas Moreno returns today for management and evaluation of his Primary mediastinal B cell Non hodgkins lymphoma after completing 6 planned cycles of EPOCH-R. The patient's last visit with [place] was on 07/29/2019. The pt reports that he is doing well overall.  The pt reports that he has had no more SOB and has been able to complete all activities as normal. Pt has what appears to be a new mole on the palm of his right hand. It has gotten slightly larger since first appearing in January. It does not cause any discomfort.   Lab results today (09/14/19) of  CBC w/diff and CMP is as follows: all values are WNL except for RBC at 5.92, Total Bilirubin at 1.3. 09/14/2019 LDH at 158  On review of systems, pt reports  denies fevers, chills, night sweats, unexpected weight loss, rashes, SOB, chest pain and any other symptoms.    MEDICAL HISTORY:  No past medical history on file.  SURGICAL HISTORY: Past Surgical History:  Procedure Laterality Date  . IR IMAGING GUIDED PORT INSERTION  04/22/2018  . IR REMOVAL TUN ACCESS W/ PORT W/O FL MOD SED  04/20/2019  . MEDIASTINOSCOPY N/A 03/10/2018   Procedure: MEDIASTINOSCOPY;  Surgeon: Ivin Poot, MD;  Location: [place];  Service: Thoracic;  Laterality: N/A;  . VIDEO ASSISTED THORACOSCOPY (VATS)/ LYMPH NODE SAMPLING Left 03/10/2018   Procedure: VIDEO ASSISTED THORACOSCOPY (VATS)/ BIOSPY ANTERIOR APPROACH;  Surgeon: Ivin Poot, MD;  Location: [place];  Service: Thoracic;  Laterality: Left;  Marland Kitchen VIDEO BRONCHOSCOPY Left 03/10/2018   Procedure: VIDEO BRONCHOSCOPY;  Surgeon: Ivin Poot, MD;  Location: [facility] OR;  Service: Thoracic;  Laterality: Left;    SOCIAL HISTORY: Social History   Socioeconomic History  . Marital status: Single    Spouse name: Not on file  . Number of children: Not on file  . Years of education: Not on file  . Highest education level: Not on file  Occupational History  . Not on file  Tobacco Use  . Smoking status: Never Smoker  . Smokeless tobacco: Never Used  Substance and Sexual Activity  . Alcohol use: Yes    Comment: socially.   . Drug use: Never  . Sexual activity: Not on file  Other Topics Concern  . Not on file  Social History Narrative  . Not on file   Social Determinants of Health   Financial Resource Strain:   . Difficulty of Paying Living Expenses:   Food Insecurity:   . Worried About Charity fundraiser in the Last Year:   . Arboriculturist in the Last Year:   Transportation Needs:   . Film/video editor (Medical):   Marland Kitchen Lack of Transportation (Non-Medical):   Physical Activity:   . Days of Exercise per Week:   . Minutes of Exercise per Session:   Stress:   . Feeling of Stress :   Social Connections:    . Frequency of Communication with Friends and Family:   . Frequency of Social Gatherings with Friends and Family:   . Attends Religious Services:   . Active Member of Clubs or Organizations:   . Attends Archivist Meetings:   Marland Kitchen Marital Status:   Intimate Partner Violence:   . Fear of Current or Ex-Partner:   . Emotionally Abused:   Marland Kitchen Physically Abused:   . Sexually Abused:     FAMILY HISTORY: No family history on file.   ALLERGIES:  has No Known Allergies.   MEDICATIONS:  Current Outpatient Medications  Medication Sig Dispense Refill  . ELIQUIS 5 MG TABS tablet TAKE 1 TABLET(5 MG) BY MOUTH TWICE DAILY (Patient not taking: Reported on 06/22/2019) 60 tablet 4  . polyethylene glycol (MIRALAX / GLYCOLAX) packet Take 17 g by mouth daily as needed. (Patient not taking: Reported on 03/23/2019) 14 each 0   No current facility-administered medications for this visit.    REVIEW OF SYSTEMS:   A 10+ POINT REVIEW OF SYSTEMS WAS OBTAINED including neurology, dermatology, psychiatry, cardiac, respiratory, lymph, extremities, GI, GU, Musculoskeletal, constitutional, breasts, reproductive, HEENT.  All pertinent positives  are noted in the HPI.  All others are negative.   PHYSICAL EXAMINATION: ECOG PERFORMANCE STATUS: 1 - Symptomatic but completely ambulatory  Vitals:   09/14/19 0835  BP: 105/61  Pulse: 63  Resp: 18  Temp: 97.7 F (36.5 C)  SpO2: 100%   Filed Weights   09/14/19 0835  Weight: 128 lb 11.2 oz (58.4 kg)   Body mass index is 20.77 kg/m.   GENERAL:alert, in no acute distress and comfortable SKIN: no acute rashes, no significant lesions, discoloration on palm of right hand - no induration or ulceration.  EYES: conjunctiva are pink and non-injected, sclera anicteric OROPHARYNX: MMM, no exudates, no oropharyngeal erythema or ulceration NECK: supple, no JVD LYMPH:  no palpable lymphadenopathy in the cervical, axillary or inguinal regions LUNGS: clear to  auscultation b/l with normal respiratory effort HEART: regular rate & rhythm ABDOMEN:  normoactive bowel sounds , non tender, not distended. No palpable hepatosplenomegaly.  Extremity: no pedal edema PSYCH: alert & oriented x 3 with fluent speech NEURO: no focal motor/sensory deficits  LABORATORY DATA:  I have reviewed the data as listed  . CBC Latest Ref Rng & Units 09/14/2019 07/29/2019 07/23/2019  WBC 4.0 - 10.5 K/uL 5.5 5.3 8.1  Hemoglobin 13.0 - 17.0 g/dL 16.5 16.5 15.4  Hematocrit 39.0 - 52.0 % 49.9 50.3 47.1  Platelets 150 - 400 K/uL 267 426(H) 392    . CMP Latest Ref Rng & Units 09/14/2019 07/29/2019 07/23/2019  Glucose 70 - 99 mg/dL 81 78 92  BUN 6 - 20 mg/dL 13 18 10   Creatinine 0.61 - 1.24 mg/dL 1.11 0.92 0.79  Sodium 135 - 145 mmol/L 141 142 140  Potassium 3.5 - 5.1 mmol/L 4.6 4.2 4.2  Chloride 98 - 111 mmol/L 104 104 102  CO2 22 - 32 mmol/L 25 26 25   Calcium 8.9 - 10.3 mg/dL 9.9 9.7 9.3  Total Protein 6.5 - 8.1 g/dL 7.5 8.0 -  Total Bilirubin 0.3 - 1.2 mg/dL 1.3(H) 0.4 -  Alkaline Phos 38 - 126 U/L 61 95 -  AST 15 - 41 U/L 24 28 -  ALT 0 - 44 U/L 22 54(H) -   03/10/18 Biopsy:     03/10/18 Tissue Flow Cytometry:     RADIOGRAPHIC STUDIES: I have personally reviewed the radiological images as listed and agreed with the findings in the report. No results found.  ASSESSMENT & PLAN:   24 y.o. male with  #1Primary Mediastinal B cell Non Hodgkins lymphoma c-MYC negative Not a double hit lymphoma Pathology confirmed by pathology read at [facility].  S/p 6 cycles of dose adjusted EPOCH-R completed on 07/04/18  10/13/2018 PET showing continued further reduction in size and metabolism of the heterogeneous density anterior mediastinal/paramediastinal mass. Persistent Deauville 3 activity. No metabolic evidence of new or progressive lymphoma.  03/16/2019 CT Chest ([id number]) revealed "1. Slight interval decrease in size of bulky, hypodense pleural based  masses of the paramedian left and right upper lobes, confluent with the adjacent mediastinum and demonstrating continued necrotic cavitation. Left sided mass measures 4.6 x 4.6 cm, previously 5.3 x 5.0 cm when measured similarly (series 2, image 67). Right-sided mass measures 3.0 x 1.7 cm, previously 3.8 x 1.9 cm when measured similarly (series 2, image 55). Findings are consistent with ongoing treatment response. 2. No significant change in post treatment appearance of anterior mediastinal soft tissue and lymph nodes, including nodes with peripheral dystrophic calcification, largest in the right aspect of the anterior mediastinum measuring 2.6 x  1.6 cm (series 2, image 54)."  07/23/2019 CT Angio Chest ([id number]) revealed "Increase in the degree of left upper lobe consolidation related to the known mediastinal mass lesion. This somewhat accentuates the size of the mass although overall it is likely stable in appearance. No evidence of pulmonary emboli. Some attenuation of the upper lobe pulmonary arterial branches secondary to the mass and consolidation is seen."  #2 s/p SVC compression due to mediastinal mass without overt SVC syndrome clinical symptoms #3 small acute distal left upper lobe pulmonary embolus-on lovenox #4 Acute Left IJ and Subclavian DVT due to left sided venous compression due to mass and due to malignancy -wason lovenox for malignancy related thrombosis, then Eliquis and is now off anticoagulation.   PLAN: -Discussed pt labwork today, 09/14/19; blood counts and chemistries are nml, Bilirubin mildly elevated, LDH is WNL -Advised pt that discoloration on right palm most likely a mole. Recommend he continue watching the area. If it increases in size significantly, recommend he see a Dermatologist for further evaluation.  -No clinical or laboratory evidence of active lymphoma at this time. -Plan to transfer his ongoing care when he moves. Pt will be moving to [place] in July.    -Will refer pt to [facility] at [facility]. -Recommend pt be seen by Oncology in 3 months and repeat Chest CT 6 months after previous CT.   FOLLOW UP: Plz cancel all subsequent oncology appointments. Patient moving to [facility] in July -- will refer to establish care at [facility]    The total time spent in the appt was 20 minutes and more than 50% was on counseling and direct patient cares.  All of the patient's questions were answered with apparent satisfaction. The patient knows to call the clinic with any problems, questions or concerns.    Sullivan Lone MD [place] AAHIVMS [facility] Hematology/Oncology Physician [facility]  (Office):       [phone] (Work cell):  [phone] (Fax):           [phone]  09/14/2019 9:22 AM  I, Yevette Edwards, am acting as a scribe for Dr. Sullivan Lone.   .I have reviewed the above documentation for accuracy and completeness, and I agree with the above. Brunetta Genera MD

## 2019-09-17 ENCOUNTER — Encounter: Payer: Self-pay | Admitting: Hematology

## 2019-09-18 ENCOUNTER — Encounter: Payer: Self-pay | Admitting: *Deleted

## 2019-09-22 ENCOUNTER — Encounter: Payer: Self-pay | Admitting: *Deleted

## 2019-09-23 ENCOUNTER — Telehealth: Payer: Self-pay | Admitting: *Deleted

## 2019-09-23 ENCOUNTER — Encounter: Payer: Self-pay | Admitting: *Deleted

## 2019-09-23 NOTE — Telephone Encounter (Signed)
Dr. Irene Limbo referring patient to the [facility] at [facility] in [place] as patient is moving to [facility] this summer. [place]  Phone [phone] Fax [phone]  Patient's contact information was given to [facility], representative at the [facility] at [facility]. They will contact patient to schedule first appointment.  MyChart message sent to patient with this information.

## 2019-09-25 ENCOUNTER — Telehealth: Payer: Self-pay | Admitting: *Deleted

## 2019-09-25 NOTE — Telephone Encounter (Signed)
Contacted patient; LVMM: Please contact office to arrange time to come in and sign ROI so that his medical records can be sent to [facility] for continuation of care once patient moves later this summer.

## 2019-09-30 ENCOUNTER — Telehealth: Payer: Self-pay | Admitting: *Deleted

## 2019-09-30 NOTE — Telephone Encounter (Signed)
Referral made to [facility] for f/u as patient moving per Dr. Grier Mitts request. Contacted by Leanor Rubenstein New Pt Clinical Coordinator 6/16. She requested patient demographics and insurance information faxed to her at [phone]) Information sent to Ms.Marcie Bal signed ROI for Belva Bertin 09/29/19] Medical Records at [facility] to send additional medical records.

## 2019-10-04 IMAGING — DX DG CHEST 1V PORT
1 series · 1 of 1 positions shown · non-contrast
Comparison: Chest x-ray 03/11/2018, 03/10/2018, 03/08/2018, CT
03/07/2018

CLINICAL DATA: 21-year-old male with mediastinal mass, status post
VATS and biopsy with mediastinoscopy 05/12/2017, suspected lymphoma.

EXAM:
PORTABLE CHEST 1 VIEW

[chest ap]
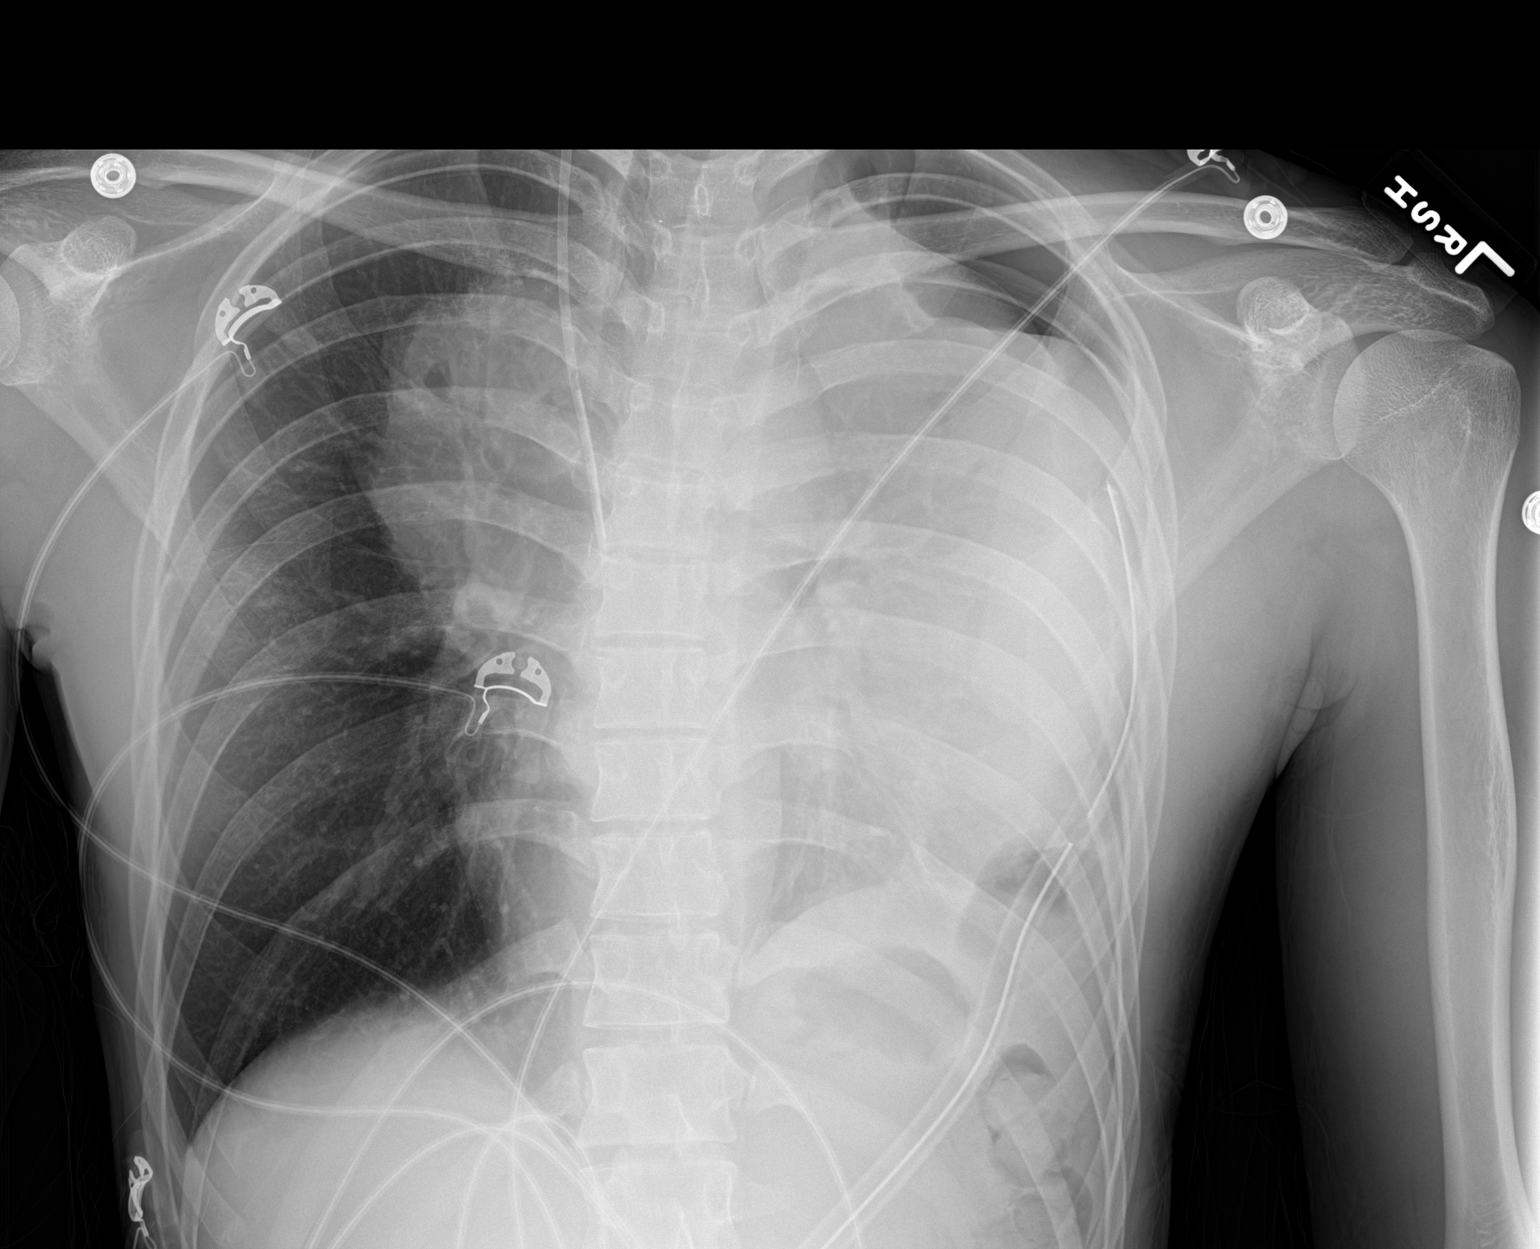

[1 of 1 positions shown; findings below may reference images not displayed]

FINDINGS: Cardiomediastinal silhouette partially obscured by lung/pleural
disease/mediastinal disease, unchanged.

In the interval there has been development of left apical
pneumothorax component. Opacity at the left base obscuring the left
costophrenic angle in the left heart border.

Unchanged left thoracostomy tube.

Right IJ central venous catheter, unchanged.

No right-sided confluent airspace disease. No pleural effusion or
pneumothorax.
IMPRESSION: Interval development of left apical pneumothorax, either ex vacuo,
or representing air leak.

Unchanged position of thoracostomy tube.

Unchanged right IJ central venous catheter.

Similar appearance of cardiomediastinal silhouette, with obscuration
of the heart borders and predominantly left lung by known
mediastinal mass and left lung consolidation, status post VATS and
mediastinoscopy.

These results were called by telephone at the time of interpretation
on 03/12/2018 at [DATE] to nurse caring for patient, Ms Laryssa in 4
Heart.

## 2019-10-05 IMAGING — CR DG CHEST 2V
2 series · 2 of 2 positions shown · non-contrast
Comparison: 03/12/2018 dating back to 03/08/2018. CT chest
03/07/2018.

CLINICAL DATA: Follow-up LEFT hydropneumothorax after chest tube
removal.

EXAM:
CHEST - 2 VIEW

[chest pa]
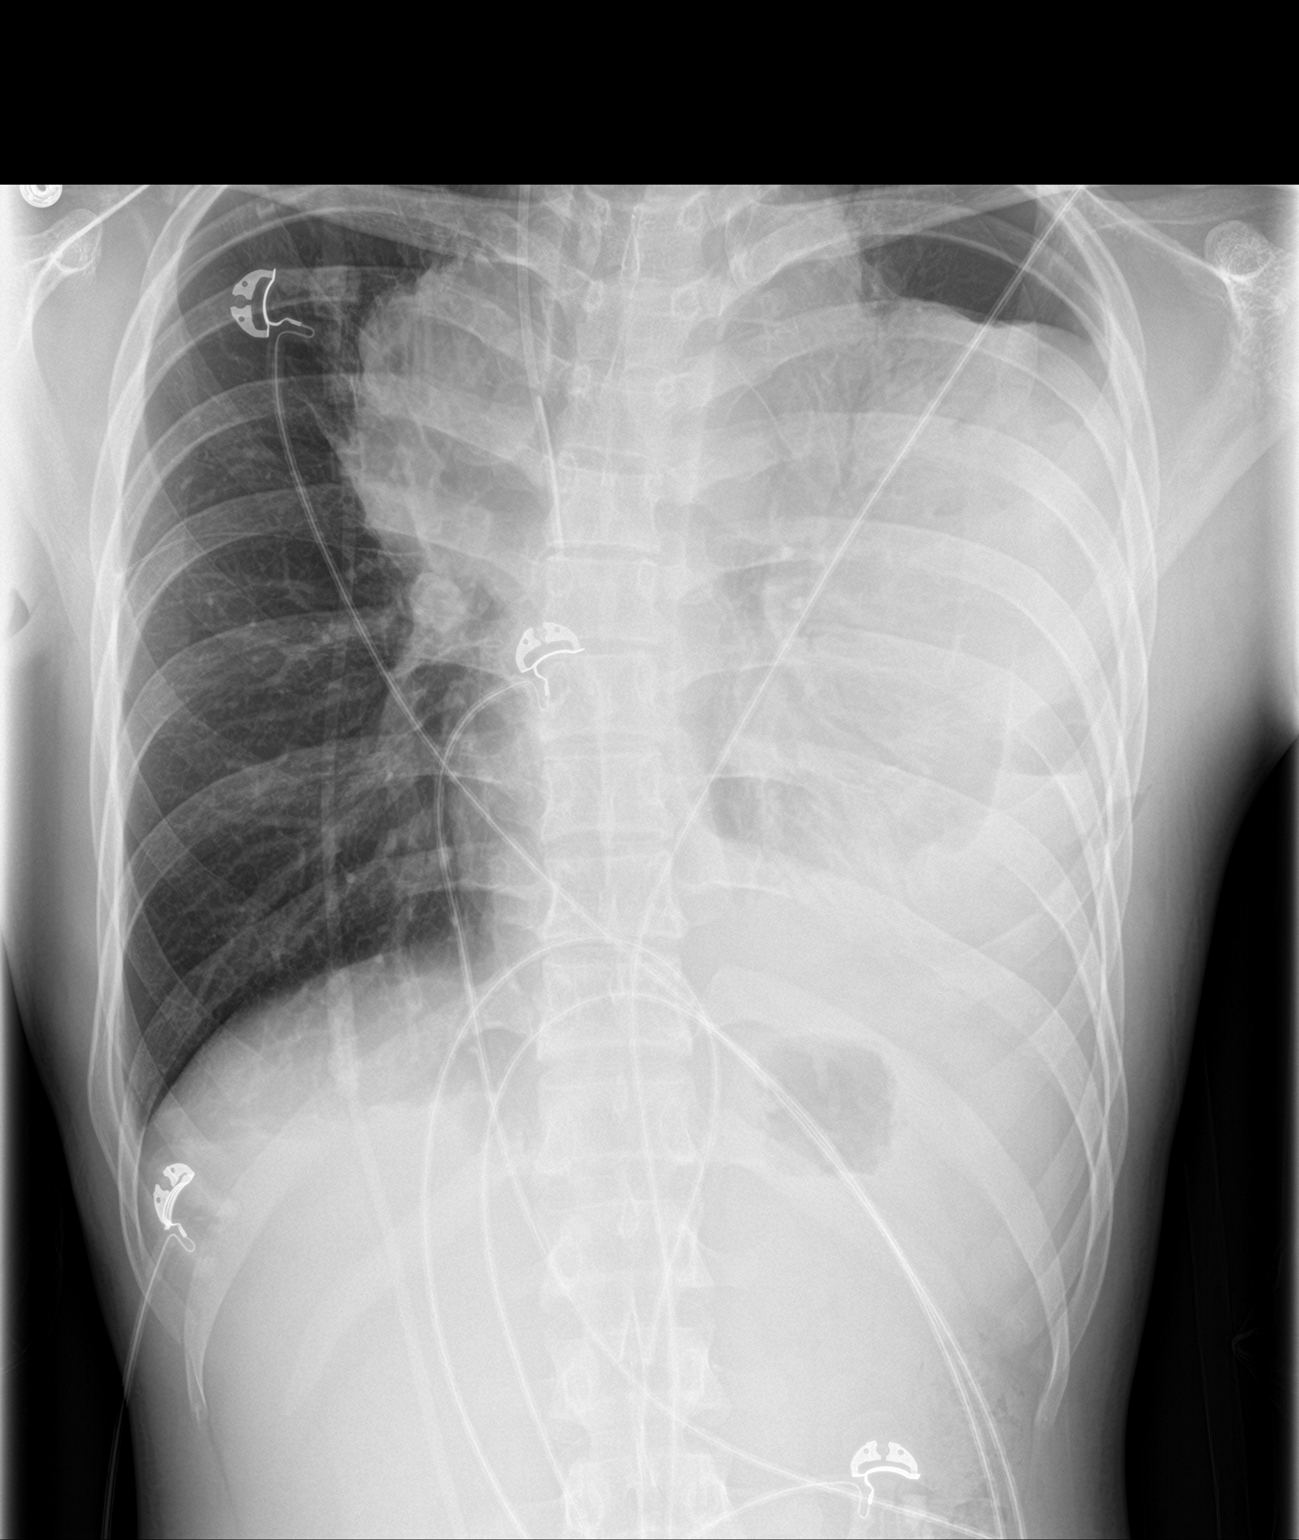

[chest lat]
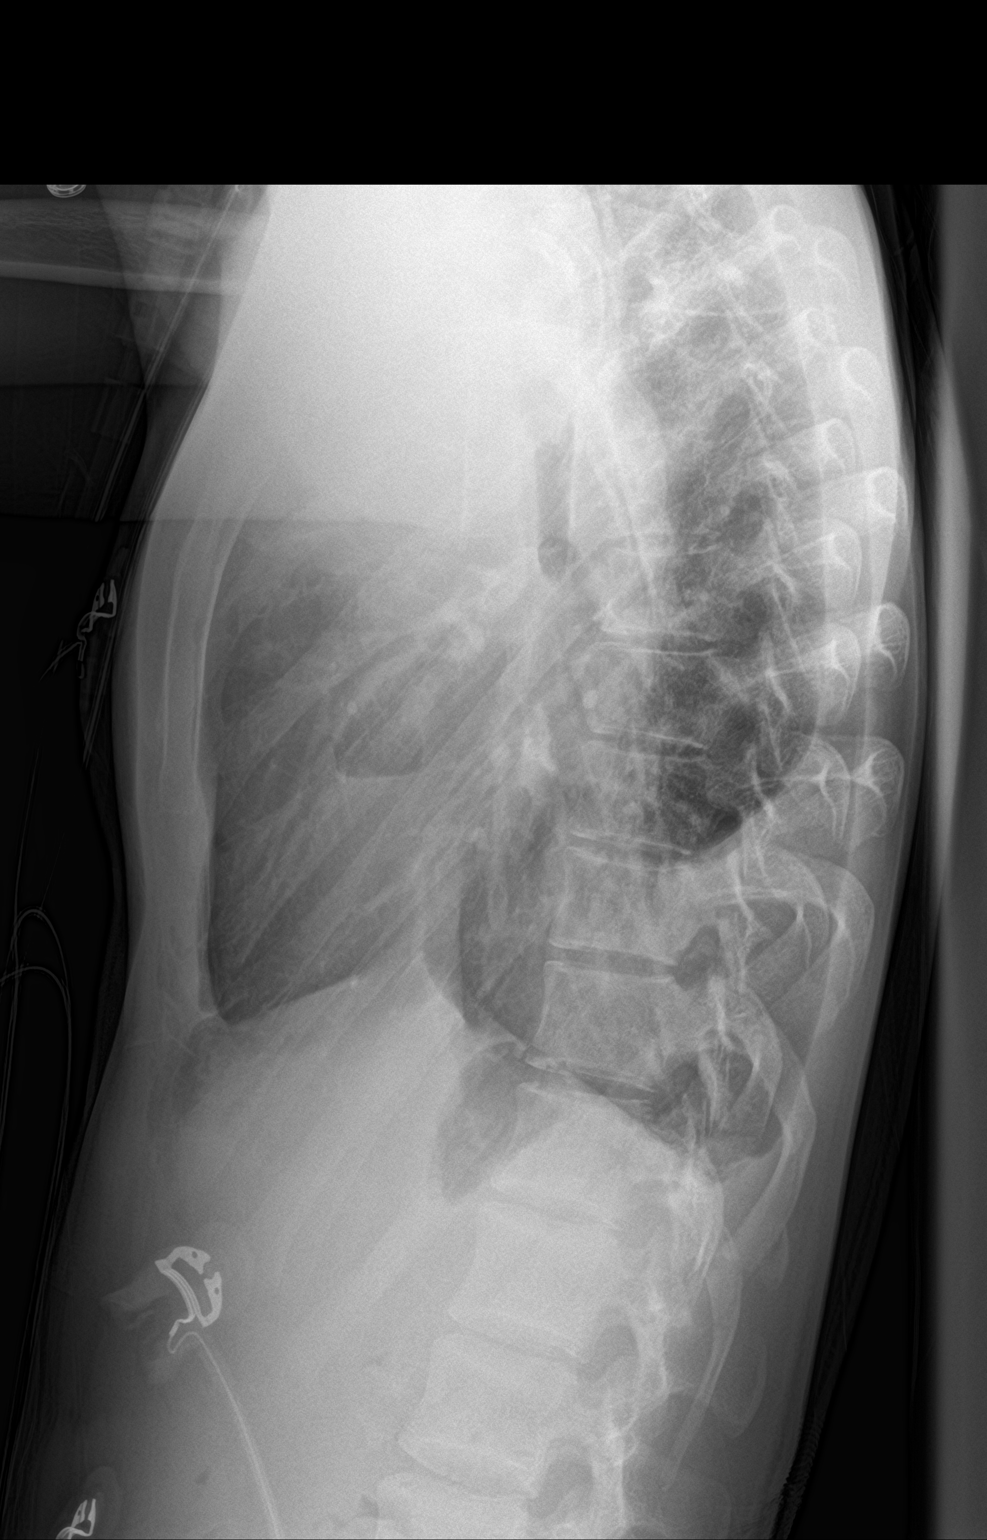

[2 of 2 positions shown; findings below may reference images not displayed]

FINDINGS: RIGHT jugular central venous catheter tip projects over the LOWER
SVC, unchanged stable LEFT apicolateral pneumothorax since
yesterday, on the order of 15% or so. Loculated LEFT pleural
effusion with air-fluid levels, unchanged. Massive mediastinal mass
with compression of the LEFT lung and compression of the MEDIAL
RIGHT UPPER LOBE as noted on the prior CT. Slight improved aeration
in the LEFT UPPER LOBE since yesterday. No new abnormalities.
IMPRESSION: 1. Stable LEFT apicolateral pneumothorax and loculated LEFT
hydropneumothorax after chest tube removal.
2. Slight improved aeration in the LEFT UPPER LOBE since yesterday.
3. Otherwise, no change in the massive mediastinal mass with
compressive atelectasis involving much of the LEFT lung and the
MEDIAL RIGHT UPPER LOBE.
4. No new abnormalities.

## 2019-10-06 IMAGING — DX DG CHEST 2V
2 series · 2 of 2 positions shown · non-contrast
Comparison: 03/13/2018

CLINICAL DATA: Shortness of breath

EXAM:
CHEST - 2 VIEW

[chest pa]
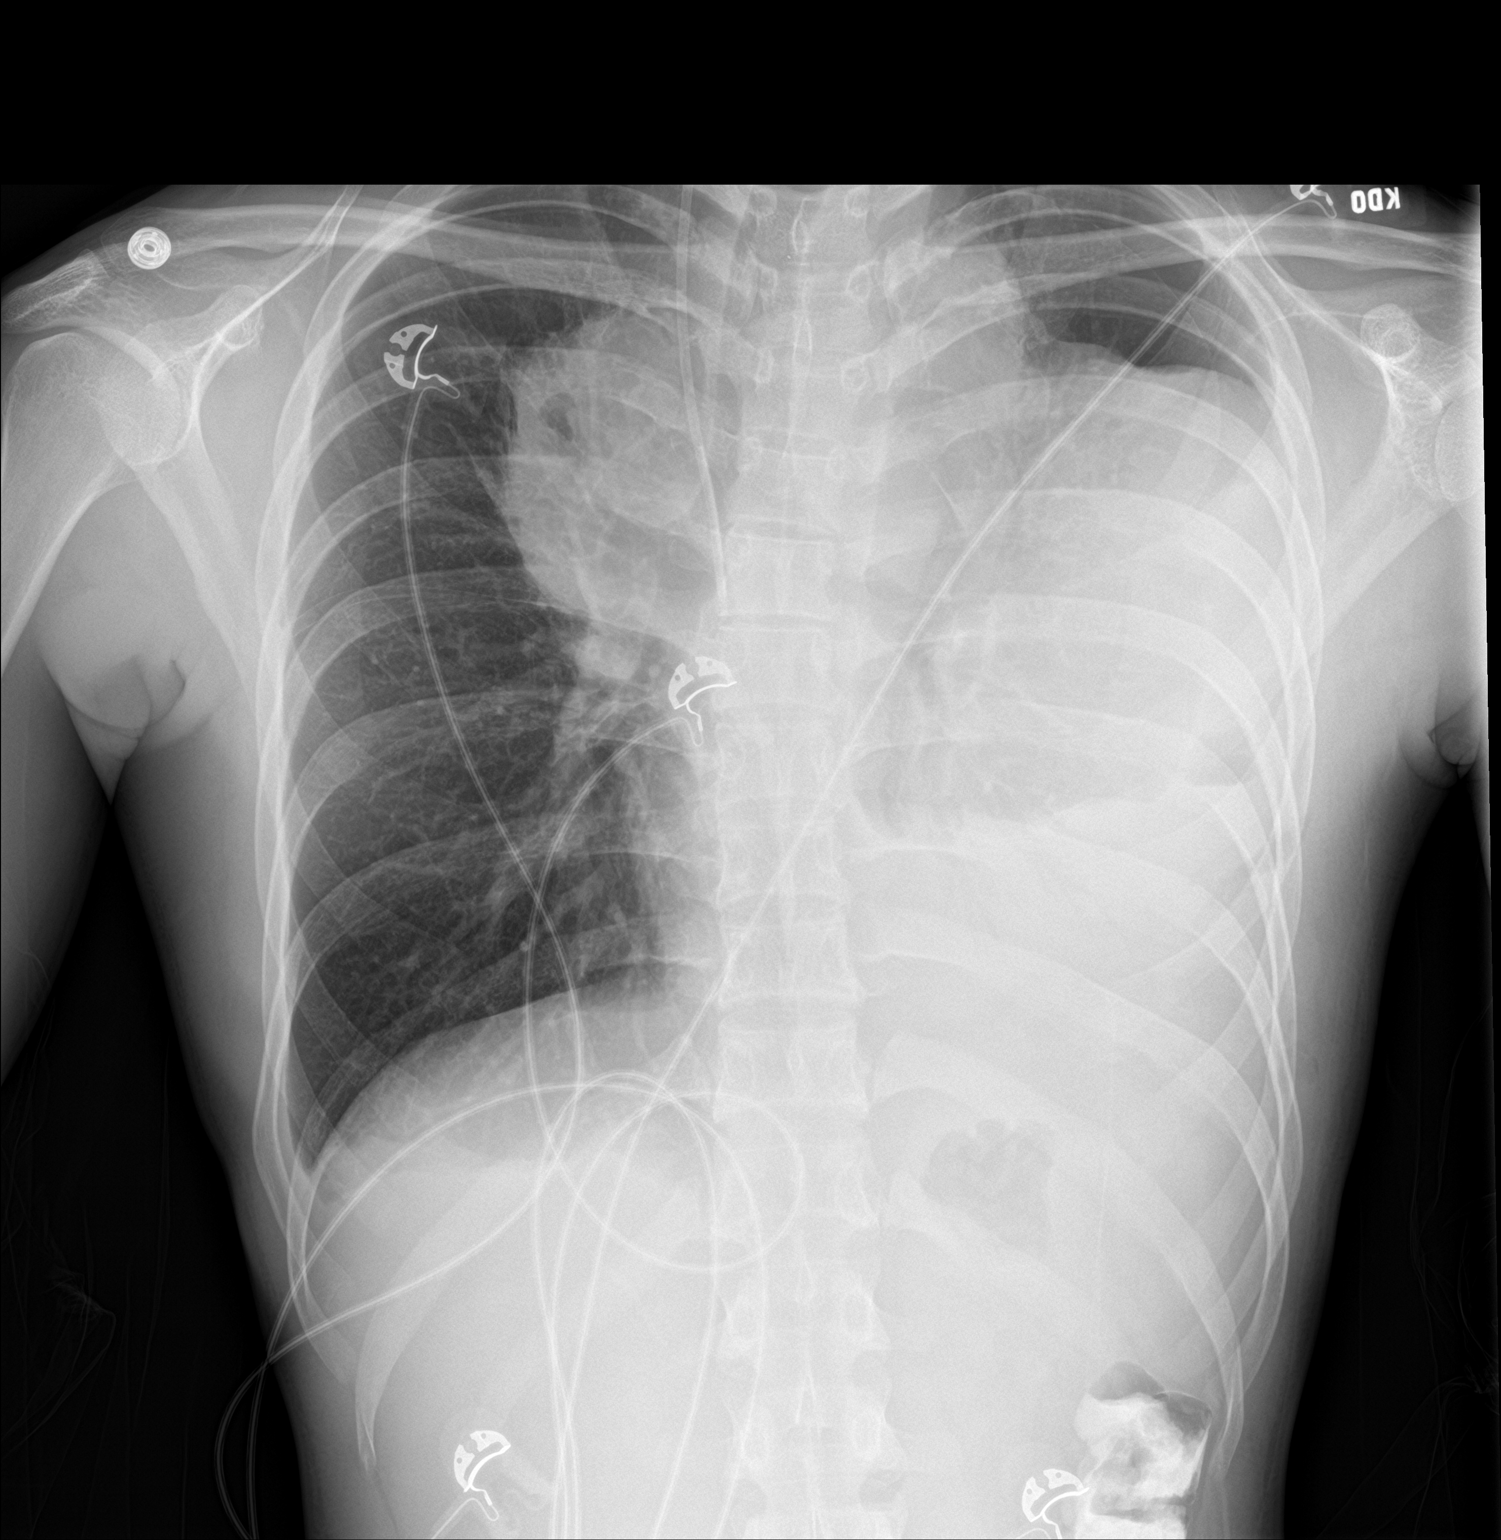

[chest lat]
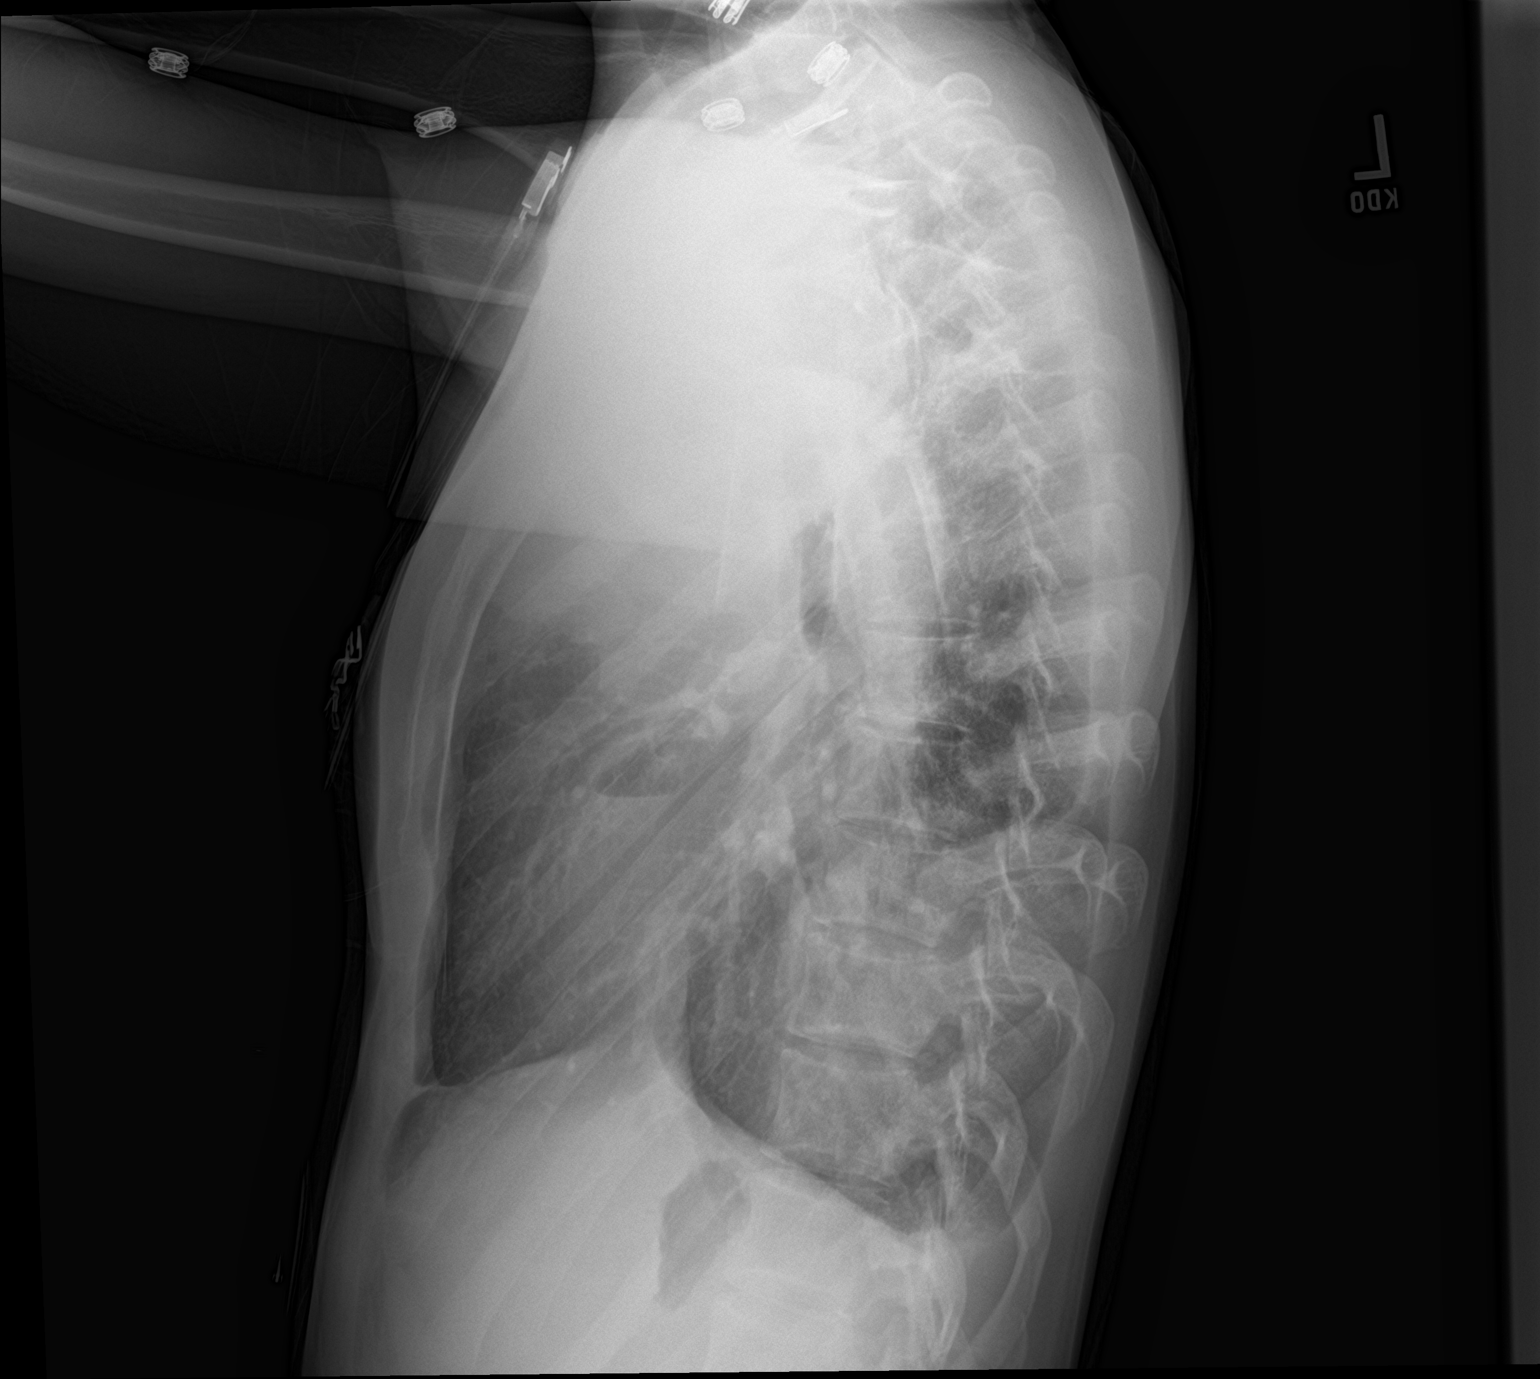

[2 of 2 positions shown; findings below may reference images not displayed]

FINDINGS: Right jugular central line is again seen. Left-sided apical
pneumothorax is again noted and slightly improved when compared with
the prior exam. Persistent mediastinal masses are noted stable from
the prior exam. Persistent small left pleural effusion is noted as
well as left basilar consolidation.
IMPRESSION: Slight decrease in left-sided hydropneumothorax.

Persistent mediastinal masses with evidence of left basilar
consolidation.

## 2019-10-29 ENCOUNTER — Other Ambulatory Visit: Payer: Medicaid Other

## 2019-10-29 ENCOUNTER — Ambulatory Visit: Payer: Medicaid Other | Admitting: Hematology

## 2021-09-13 ENCOUNTER — Other Ambulatory Visit: Payer: Self-pay | Admitting: Oncology
